# Patient Record
Sex: Male | Born: 1940 | Race: White | Hispanic: No | Marital: Married | Smoking: Former smoker
Health system: Southern US, Community
[De-identification: ages and names within clinical notes are randomized; demographics above are authoritative.]

## PROBLEM LIST (undated history)

## (undated) DIAGNOSIS — J189 Pneumonia, unspecified organism: Secondary | ICD-10-CM

## (undated) DIAGNOSIS — Z9989 Dependence on other enabling machines and devices: Secondary | ICD-10-CM

## (undated) DIAGNOSIS — Z85038 Personal history of other malignant neoplasm of large intestine: Secondary | ICD-10-CM

## (undated) DIAGNOSIS — G4730 Sleep apnea, unspecified: Secondary | ICD-10-CM

## (undated) DIAGNOSIS — E785 Hyperlipidemia, unspecified: Secondary | ICD-10-CM

## (undated) DIAGNOSIS — E7800 Pure hypercholesterolemia, unspecified: Secondary | ICD-10-CM

## (undated) DIAGNOSIS — K222 Esophageal obstruction: Secondary | ICD-10-CM

## (undated) DIAGNOSIS — I359 Nonrheumatic aortic valve disorder, unspecified: Secondary | ICD-10-CM

## (undated) DIAGNOSIS — G609 Hereditary and idiopathic neuropathy, unspecified: Secondary | ICD-10-CM

## (undated) DIAGNOSIS — K922 Gastrointestinal hemorrhage, unspecified: Secondary | ICD-10-CM

## (undated) DIAGNOSIS — K766 Portal hypertension: Secondary | ICD-10-CM

## (undated) DIAGNOSIS — S069X9A Unspecified intracranial injury with loss of consciousness of unspecified duration, initial encounter: Secondary | ICD-10-CM

## (undated) DIAGNOSIS — Z8679 Personal history of other diseases of the circulatory system: Secondary | ICD-10-CM

## (undated) DIAGNOSIS — G3281 Cerebellar ataxia in diseases classified elsewhere: Secondary | ICD-10-CM

## (undated) DIAGNOSIS — H9190 Unspecified hearing loss, unspecified ear: Secondary | ICD-10-CM

## (undated) DIAGNOSIS — J0100 Acute maxillary sinusitis, unspecified: Secondary | ICD-10-CM

## (undated) DIAGNOSIS — K219 Gastro-esophageal reflux disease without esophagitis: Secondary | ICD-10-CM

## (undated) DIAGNOSIS — I8500 Esophageal varices without bleeding: Secondary | ICD-10-CM

## (undated) DIAGNOSIS — G20 Parkinson's disease: Secondary | ICD-10-CM

## (undated) DIAGNOSIS — J069 Acute upper respiratory infection, unspecified: Secondary | ICD-10-CM

## (undated) DIAGNOSIS — K5790 Diverticulosis of intestine, part unspecified, without perforation or abscess without bleeding: Secondary | ICD-10-CM

## (undated) DIAGNOSIS — H908 Mixed conductive and sensorineural hearing loss, unspecified: Secondary | ICD-10-CM

## (undated) DIAGNOSIS — G4733 Obstructive sleep apnea (adult) (pediatric): Secondary | ICD-10-CM

## (undated) DIAGNOSIS — Z8719 Personal history of other diseases of the digestive system: Secondary | ICD-10-CM

## (undated) DIAGNOSIS — I4891 Unspecified atrial fibrillation: Secondary | ICD-10-CM

## (undated) DIAGNOSIS — R7309 Other abnormal glucose: Secondary | ICD-10-CM

## (undated) DIAGNOSIS — I2510 Atherosclerotic heart disease of native coronary artery without angina pectoris: Secondary | ICD-10-CM

## (undated) DIAGNOSIS — K469 Unspecified abdominal hernia without obstruction or gangrene: Secondary | ICD-10-CM

## (undated) DIAGNOSIS — G629 Polyneuropathy, unspecified: Secondary | ICD-10-CM

## (undated) DIAGNOSIS — K7400 Hepatic fibrosis, unspecified: Secondary | ICD-10-CM

## (undated) DIAGNOSIS — C801 Malignant (primary) neoplasm, unspecified: Secondary | ICD-10-CM

## (undated) DIAGNOSIS — C182 Malignant neoplasm of ascending colon: Secondary | ICD-10-CM

## (undated) DIAGNOSIS — M1990 Unspecified osteoarthritis, unspecified site: Secondary | ICD-10-CM

## (undated) DIAGNOSIS — N200 Calculus of kidney: Secondary | ICD-10-CM

## (undated) DIAGNOSIS — I8501 Esophageal varices with bleeding: Secondary | ICD-10-CM

## (undated) DIAGNOSIS — R0602 Shortness of breath: Secondary | ICD-10-CM

## (undated) DIAGNOSIS — D696 Thrombocytopenia, unspecified: Secondary | ICD-10-CM

## (undated) DIAGNOSIS — N400 Enlarged prostate without lower urinary tract symptoms: Secondary | ICD-10-CM

## (undated) DIAGNOSIS — R260 Ataxic gait: Secondary | ICD-10-CM

## (undated) DIAGNOSIS — N529 Male erectile dysfunction, unspecified: Secondary | ICD-10-CM

## (undated) DIAGNOSIS — Z86010 Personal history of colonic polyps: Secondary | ICD-10-CM

## (undated) DIAGNOSIS — S72409A Unspecified fracture of lower end of unspecified femur, initial encounter for closed fracture: Secondary | ICD-10-CM

## (undated) DIAGNOSIS — R0902 Hypoxemia: Secondary | ICD-10-CM

## (undated) DIAGNOSIS — D649 Anemia, unspecified: Secondary | ICD-10-CM

## (undated) HISTORY — DX: Diverticulosis of intestine, part unspecified, without perforation or abscess without bleeding: K57.90

## (undated) HISTORY — DX: Hyperlipidemia, unspecified: E78.5

## (undated) HISTORY — DX: Esophageal varices with bleeding: I85.01

## (undated) HISTORY — DX: Unspecified atrial fibrillation: I48.91

## (undated) HISTORY — DX: Personal history of other diseases of the digestive system: Z87.19

## (undated) HISTORY — DX: Other disorders of bilirubin metabolism: E80.6

## (undated) HISTORY — DX: Hypoxemia: R09.02

## (undated) HISTORY — DX: Shortness of breath: R06.02

## (undated) HISTORY — DX: Anemia, unspecified: D64.9

## (undated) HISTORY — DX: Unspecified hearing loss, unspecified ear: H91.90

## (undated) HISTORY — DX: Benign prostatic hyperplasia without lower urinary tract symptoms: N40.0

## (undated) HISTORY — DX: Portal hypertension: K76.6

## (undated) HISTORY — DX: Hepatic fibrosis, unspecified: K74.00

## (undated) HISTORY — DX: Calculus of kidney: N20.0

## (undated) HISTORY — DX: Unspecified osteoarthritis, unspecified site: M19.90

## (undated) HISTORY — DX: Pneumonia, unspecified organism: J18.9

## (undated) HISTORY — DX: Personal history of colonic polyps: Z86.010

## (undated) HISTORY — PX: INGUINAL HERNIA REPAIR: SUR1180

## (undated) HISTORY — DX: Esophageal obstruction: K22.2

## (undated) HISTORY — DX: Personal history of other diseases of the circulatory system: Z86.79

## (undated) HISTORY — DX: Malignant (primary) neoplasm, unspecified: C80.1

## (undated) HISTORY — DX: Atherosclerotic heart disease of native coronary artery without angina pectoris: I25.10

---
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
## 1984-02-09 DIAGNOSIS — S069X9A Unspecified intracranial injury with loss of consciousness of unspecified duration, initial encounter: Secondary | ICD-10-CM

## 1984-02-09 DIAGNOSIS — S069XAA Unspecified intracranial injury with loss of consciousness status unknown, initial encounter: Secondary | ICD-10-CM

## 1984-02-09 HISTORY — DX: Unspecified intracranial injury with loss of consciousness status unknown, initial encounter: S06.9XAA

## 1984-02-09 HISTORY — DX: Unspecified intracranial injury with loss of consciousness of unspecified duration, initial encounter: S06.9X9A

## 1997-11-12 ENCOUNTER — Encounter: Payer: Self-pay | Admitting: Gastroenterology

## 2001-04-24 ENCOUNTER — Encounter: Payer: Self-pay | Admitting: Pulmonary Disease

## 2001-04-24 ENCOUNTER — Ambulatory Visit (HOSPITAL_BASED_OUTPATIENT_CLINIC_OR_DEPARTMENT_OTHER): Admission: RE | Admit: 2001-04-24 | Discharge: 2001-04-24 | Payer: Self-pay | Admitting: Internal Medicine

## 2001-05-18 ENCOUNTER — Encounter: Payer: Self-pay | Admitting: Pulmonary Disease

## 2002-07-10 HISTORY — PX: CARDIAC CATHETERIZATION: SHX172

## 2002-07-30 ENCOUNTER — Encounter: Payer: Self-pay | Admitting: Cardiology

## 2002-07-30 ENCOUNTER — Ambulatory Visit (HOSPITAL_COMMUNITY): Admission: RE | Admit: 2002-07-30 | Discharge: 2002-07-30 | Payer: Self-pay | Admitting: Cardiology

## 2003-01-25 ENCOUNTER — Emergency Department (HOSPITAL_COMMUNITY): Admission: EM | Admit: 2003-01-25 | Discharge: 2003-01-25 | Payer: Self-pay | Admitting: Emergency Medicine

## 2004-02-09 DIAGNOSIS — I4891 Unspecified atrial fibrillation: Secondary | ICD-10-CM

## 2004-02-09 HISTORY — DX: Unspecified atrial fibrillation: I48.91

## 2004-02-09 HISTORY — PX: ESOPHAGEAL DILATION: SHX303

## 2004-02-27 ENCOUNTER — Ambulatory Visit: Payer: Self-pay | Admitting: Pulmonary Disease

## 2004-05-08 ENCOUNTER — Ambulatory Visit: Payer: Self-pay | Admitting: Gastroenterology

## 2004-07-21 ENCOUNTER — Ambulatory Visit: Payer: Self-pay | Admitting: Internal Medicine

## 2004-07-27 ENCOUNTER — Encounter: Admission: RE | Admit: 2004-07-27 | Discharge: 2004-07-27 | Payer: Self-pay | Admitting: Internal Medicine

## 2004-08-07 ENCOUNTER — Ambulatory Visit: Payer: Self-pay | Admitting: Gastroenterology

## 2005-01-21 ENCOUNTER — Emergency Department (HOSPITAL_COMMUNITY): Admission: EM | Admit: 2005-01-21 | Discharge: 2005-01-22 | Payer: Self-pay | Admitting: Emergency Medicine

## 2005-06-24 ENCOUNTER — Ambulatory Visit: Payer: Self-pay | Admitting: Internal Medicine

## 2005-11-10 ENCOUNTER — Ambulatory Visit: Payer: Self-pay | Admitting: Internal Medicine

## 2005-12-23 ENCOUNTER — Ambulatory Visit: Payer: Self-pay | Admitting: Internal Medicine

## 2006-09-06 ENCOUNTER — Encounter: Payer: Self-pay | Admitting: Internal Medicine

## 2006-09-22 ENCOUNTER — Encounter (INDEPENDENT_AMBULATORY_CARE_PROVIDER_SITE_OTHER): Payer: Self-pay | Admitting: *Deleted

## 2006-09-22 ENCOUNTER — Ambulatory Visit: Payer: Self-pay | Admitting: Internal Medicine

## 2006-09-22 DIAGNOSIS — E785 Hyperlipidemia, unspecified: Secondary | ICD-10-CM | POA: Insufficient documentation

## 2006-09-22 DIAGNOSIS — N400 Enlarged prostate without lower urinary tract symptoms: Secondary | ICD-10-CM | POA: Insufficient documentation

## 2006-09-22 DIAGNOSIS — R7989 Other specified abnormal findings of blood chemistry: Secondary | ICD-10-CM | POA: Insufficient documentation

## 2006-09-22 DIAGNOSIS — N529 Male erectile dysfunction, unspecified: Secondary | ICD-10-CM

## 2006-09-22 HISTORY — DX: Male erectile dysfunction, unspecified: N52.9

## 2006-09-23 ENCOUNTER — Encounter: Payer: Self-pay | Admitting: Internal Medicine

## 2006-09-24 LAB — CONVERTED CEMR LAB
ALT: 26 units/L (ref 0–53)
AST: 22 units/L (ref 0–37)
Albumin: 3.9 g/dL (ref 3.5–5.2)
Alkaline Phosphatase: 86 units/L (ref 39–117)
BUN: 10 mg/dL (ref 6–23)
Basophils Absolute: 0 10*3/uL (ref 0.0–0.1)
Basophils Relative: 0.5 % (ref 0.0–1.0)
Bilirubin, Direct: 0.1 mg/dL (ref 0.0–0.3)
CO2: 31 meq/L (ref 19–32)
Calcium: 9.4 mg/dL (ref 8.4–10.5)
Chloride: 109 meq/L (ref 96–112)
Creatinine, Ser: 1.1 mg/dL (ref 0.4–1.5)
Eosinophils Absolute: 0.2 10*3/uL (ref 0.0–0.6)
Eosinophils Relative: 3.9 % (ref 0.0–5.0)
GFR calc Af Amer: 87 mL/min
GFR calc non Af Amer: 72 mL/min
Glucose, Bld: 113 mg/dL — ABNORMAL HIGH (ref 70–99)
HCT: 44.5 % (ref 39.0–52.0)
Hemoglobin: 15.4 g/dL (ref 13.0–17.0)
Hgb A1c MFr Bld: 5 % (ref 4.6–6.0)
Lymphocytes Relative: 38.6 % (ref 12.0–46.0)
MCHC: 34.5 g/dL (ref 30.0–36.0)
MCV: 92.5 fL (ref 78.0–100.0)
Monocytes Absolute: 0.4 10*3/uL (ref 0.2–0.7)
Monocytes Relative: 8.5 % (ref 3.0–11.0)
Neutro Abs: 2.2 10*3/uL (ref 1.4–7.7)
Neutrophils Relative %: 48.5 % (ref 43.0–77.0)
PSA: 2.34 ng/mL (ref 0.10–4.00)
Platelets: 176 10*3/uL (ref 150–400)
Potassium: 3.7 meq/L (ref 3.5–5.1)
RBC: 4.81 M/uL (ref 4.22–5.81)
RDW: 12.4 % (ref 11.5–14.6)
Sodium: 144 meq/L (ref 135–145)
TSH: 2.01 microintl units/mL (ref 0.35–5.50)
Testosterone: 355.6 ng/dL (ref 350.00–890)
Total Bilirubin: 1.2 mg/dL (ref 0.3–1.2)
Total Protein: 6.8 g/dL (ref 6.0–8.3)
WBC: 4.5 10*3/uL (ref 4.5–10.5)

## 2006-09-26 ENCOUNTER — Encounter (INDEPENDENT_AMBULATORY_CARE_PROVIDER_SITE_OTHER): Payer: Self-pay | Admitting: *Deleted

## 2007-02-06 ENCOUNTER — Emergency Department (HOSPITAL_COMMUNITY): Admission: EM | Admit: 2007-02-06 | Discharge: 2007-02-06 | Payer: Self-pay | Admitting: Emergency Medicine

## 2007-09-05 ENCOUNTER — Encounter: Payer: Self-pay | Admitting: Internal Medicine

## 2007-10-03 ENCOUNTER — Ambulatory Visit: Payer: Self-pay | Admitting: Internal Medicine

## 2007-10-03 DIAGNOSIS — I4891 Unspecified atrial fibrillation: Secondary | ICD-10-CM | POA: Insufficient documentation

## 2007-10-03 DIAGNOSIS — R5383 Other fatigue: Secondary | ICD-10-CM | POA: Insufficient documentation

## 2007-10-03 DIAGNOSIS — R5381 Other malaise: Secondary | ICD-10-CM | POA: Insufficient documentation

## 2007-10-09 LAB — CONVERTED CEMR LAB
Basophils Absolute: 0.1 10*3/uL (ref 0.0–0.1)
Basophils Relative: 1.9 % (ref 0.0–3.0)
Eosinophils Absolute: 0.2 10*3/uL (ref 0.0–0.7)
Eosinophils Relative: 5.4 % — ABNORMAL HIGH (ref 0.0–5.0)
Free T4: 0.9 ng/dL (ref 0.6–1.6)
HCT: 40 % (ref 39.0–52.0)
Hemoglobin: 14.4 g/dL (ref 13.0–17.0)
Lymphocytes Relative: 36.6 % (ref 12.0–46.0)
MCHC: 36 g/dL (ref 30.0–36.0)
MCV: 91.2 fL (ref 78.0–100.0)
Monocytes Absolute: 0.3 10*3/uL (ref 0.1–1.0)
Monocytes Relative: 6.6 % (ref 3.0–12.0)
Neutro Abs: 2.3 10*3/uL (ref 1.4–7.7)
Neutrophils Relative %: 49.5 % (ref 43.0–77.0)
Platelets: 170 10*3/uL (ref 150–400)
Pro B Natriuretic peptide (BNP): 41 pg/mL (ref 0.0–100.0)
RBC: 4.39 M/uL (ref 4.22–5.81)
RDW: 12.4 % (ref 11.5–14.6)
TSH: 0.84 microintl units/mL (ref 0.35–5.50)
WBC: 4.5 10*3/uL (ref 4.5–10.5)

## 2007-10-10 ENCOUNTER — Ambulatory Visit: Payer: Self-pay | Admitting: Internal Medicine

## 2007-10-10 ENCOUNTER — Encounter (INDEPENDENT_AMBULATORY_CARE_PROVIDER_SITE_OTHER): Payer: Self-pay | Admitting: *Deleted

## 2007-10-10 LAB — CONVERTED CEMR LAB
OCCULT 1: NEGATIVE
OCCULT 2: NEGATIVE
OCCULT 3: NEGATIVE

## 2007-10-19 ENCOUNTER — Ambulatory Visit: Payer: Self-pay | Admitting: Pulmonary Disease

## 2007-11-15 ENCOUNTER — Ambulatory Visit: Payer: Self-pay | Admitting: Internal Medicine

## 2007-12-13 ENCOUNTER — Ambulatory Visit: Payer: Self-pay | Admitting: Internal Medicine

## 2007-12-13 DIAGNOSIS — R7309 Other abnormal glucose: Secondary | ICD-10-CM | POA: Insufficient documentation

## 2007-12-14 ENCOUNTER — Ambulatory Visit: Payer: Self-pay | Admitting: Pulmonary Disease

## 2007-12-14 DIAGNOSIS — G4733 Obstructive sleep apnea (adult) (pediatric): Secondary | ICD-10-CM | POA: Insufficient documentation

## 2007-12-15 ENCOUNTER — Encounter: Payer: Self-pay | Admitting: Pulmonary Disease

## 2007-12-16 LAB — CONVERTED CEMR LAB
ALT: 45 units/L (ref 0–53)
AST: 26 units/L (ref 0–37)
Albumin: 3.9 g/dL (ref 3.5–5.2)
Alkaline Phosphatase: 84 units/L (ref 39–117)
BUN: 10 mg/dL (ref 6–23)
Bilirubin, Direct: 0.2 mg/dL (ref 0.0–0.3)
Creatinine, Ser: 1.1 mg/dL (ref 0.4–1.5)
Hgb A1c MFr Bld: 5.1 % (ref 4.6–6.0)
PSA: 2.32 ng/mL (ref 0.10–4.00)
Potassium: 4.5 meq/L (ref 3.5–5.1)
TSH: 1 microintl units/mL (ref 0.35–5.50)
Total Bilirubin: 1.7 mg/dL — ABNORMAL HIGH (ref 0.3–1.2)
Total Protein: 7.3 g/dL (ref 6.0–8.3)

## 2007-12-18 ENCOUNTER — Encounter (INDEPENDENT_AMBULATORY_CARE_PROVIDER_SITE_OTHER): Payer: Self-pay | Admitting: *Deleted

## 2008-01-13 ENCOUNTER — Encounter: Payer: Self-pay | Admitting: Pulmonary Disease

## 2008-01-16 ENCOUNTER — Telehealth: Payer: Self-pay | Admitting: Pulmonary Disease

## 2008-05-08 ENCOUNTER — Ambulatory Visit: Payer: Self-pay | Admitting: Pulmonary Disease

## 2008-05-24 ENCOUNTER — Ambulatory Visit: Payer: Self-pay | Admitting: Family Medicine

## 2008-07-09 ENCOUNTER — Ambulatory Visit: Payer: Self-pay | Admitting: Family Medicine

## 2008-07-09 DIAGNOSIS — K222 Esophageal obstruction: Secondary | ICD-10-CM | POA: Insufficient documentation

## 2008-07-11 ENCOUNTER — Ambulatory Visit: Payer: Self-pay | Admitting: Gastroenterology

## 2008-08-20 ENCOUNTER — Encounter: Payer: Self-pay | Admitting: Family Medicine

## 2008-10-07 ENCOUNTER — Ambulatory Visit: Payer: Self-pay | Admitting: Family Medicine

## 2008-10-07 DIAGNOSIS — H6980 Other specified disorders of Eustachian tube, unspecified ear: Secondary | ICD-10-CM | POA: Insufficient documentation

## 2008-12-16 ENCOUNTER — Ambulatory Visit: Payer: Self-pay | Admitting: Family Medicine

## 2008-12-16 LAB — CONVERTED CEMR LAB
ALT: 23 units/L (ref 0–53)
AST: 22 units/L (ref 0–37)
Albumin: 3.6 g/dL (ref 3.5–5.2)
Alkaline Phosphatase: 77 units/L (ref 39–117)
BUN: 7 mg/dL (ref 6–23)
Bilirubin Urine: NEGATIVE
Bilirubin, Direct: 0.1 mg/dL (ref 0.0–0.3)
CO2: 29 meq/L (ref 19–32)
Calcium: 9.2 mg/dL (ref 8.4–10.5)
Chloride: 108 meq/L (ref 96–112)
Cholesterol: 122 mg/dL (ref 0–200)
Creatinine, Ser: 1.1 mg/dL (ref 0.4–1.5)
Creatinine,U: 106.9 mg/dL
GFR calc non Af Amer: 70.94 mL/min (ref >60)
Glucose, Bld: 126 mg/dL — ABNORMAL HIGH (ref 70–99)
Glucose, Urine, Semiquant: NEGATIVE
HDL: 39.4 mg/dL (ref >39.00)
Hgb A1c MFr Bld: 5 % (ref 4.6–6.5)
Ketones, urine, test strip: NEGATIVE
LDL Cholesterol: 67 mg/dL (ref 0–99)
Microalb Creat Ratio: 1.9 mg/g (ref 0.0–30.0)
Microalb, Ur: 0.2 mg/dL (ref 0.0–1.9)
Nitrite: NEGATIVE
PSA: 2.67 ng/mL (ref 0.10–4.00)
Potassium: 4.4 meq/L (ref 3.5–5.1)
Protein, U semiquant: NEGATIVE
Sodium: 143 meq/L (ref 135–145)
Specific Gravity, Urine: 1.015
TSH: 1.26 microintl units/mL (ref 0.35–5.50)
Total Bilirubin: 1.3 mg/dL — ABNORMAL HIGH (ref 0.3–1.2)
Total CHOL/HDL Ratio: 3
Total Protein: 6.8 g/dL (ref 6.0–8.3)
Triglycerides: 76 mg/dL (ref 0.0–149.0)
Urobilinogen, UA: 0.2
VLDL: 15.2 mg/dL (ref 0.0–40.0)
WBC Urine, dipstick: NEGATIVE
pH: 6

## 2008-12-24 ENCOUNTER — Ambulatory Visit: Payer: Self-pay | Admitting: Family Medicine

## 2008-12-24 DIAGNOSIS — H908 Mixed conductive and sensorineural hearing loss, unspecified: Secondary | ICD-10-CM

## 2008-12-24 HISTORY — DX: Mixed conductive and sensorineural hearing loss, unspecified: H90.8

## 2009-01-01 ENCOUNTER — Encounter: Payer: Self-pay | Admitting: Family Medicine

## 2009-07-22 ENCOUNTER — Encounter (INDEPENDENT_AMBULATORY_CARE_PROVIDER_SITE_OTHER): Payer: Self-pay | Admitting: *Deleted

## 2009-09-01 ENCOUNTER — Encounter (INDEPENDENT_AMBULATORY_CARE_PROVIDER_SITE_OTHER): Payer: Self-pay | Admitting: *Deleted

## 2009-09-03 ENCOUNTER — Ambulatory Visit: Payer: Self-pay | Admitting: Gastroenterology

## 2009-09-17 ENCOUNTER — Ambulatory Visit: Payer: Self-pay | Admitting: Gastroenterology

## 2009-09-17 DIAGNOSIS — Z860100 Personal history of colon polyps, unspecified: Secondary | ICD-10-CM

## 2009-09-17 HISTORY — DX: Personal history of colon polyps, unspecified: Z86.0100

## 2009-09-17 HISTORY — DX: Personal history of colonic polyps: Z86.010

## 2009-09-23 ENCOUNTER — Encounter: Payer: Self-pay | Admitting: Gastroenterology

## 2009-12-24 ENCOUNTER — Ambulatory Visit: Payer: Self-pay | Admitting: Family Medicine

## 2009-12-24 LAB — CONVERTED CEMR LAB
ALT: 22 units/L (ref 0–53)
AST: 23 units/L (ref 0–37)
Albumin: 3.9 g/dL (ref 3.5–5.2)
Alkaline Phosphatase: 70 units/L (ref 39–117)
BUN: 14 mg/dL (ref 6–23)
Basophils Absolute: 0 10*3/uL (ref 0.0–0.1)
Basophils Relative: 0.5 % (ref 0.0–3.0)
Bilirubin Urine: NEGATIVE
Bilirubin, Direct: 0.2 mg/dL (ref 0.0–0.3)
Blood in Urine, dipstick: NEGATIVE
CO2: 28 meq/L (ref 19–32)
Calcium: 9.1 mg/dL (ref 8.4–10.5)
Chloride: 106 meq/L (ref 96–112)
Cholesterol: 156 mg/dL (ref 0–200)
Creatinine, Ser: 1.1 mg/dL (ref 0.4–1.5)
Eosinophils Absolute: 0.2 10*3/uL (ref 0.0–0.7)
Eosinophils Relative: 4.4 % (ref 0.0–5.0)
GFR calc non Af Amer: 72.23 mL/min (ref >60)
Glucose, Bld: 100 mg/dL — ABNORMAL HIGH (ref 70–99)
Glucose, Urine, Semiquant: NEGATIVE
HCT: 38.7 % — ABNORMAL LOW (ref 39.0–52.0)
HDL: 51.7 mg/dL (ref >39.00)
Hemoglobin: 13.4 g/dL (ref 13.0–17.0)
LDL Cholesterol: 92 mg/dL (ref 0–99)
Lymphocytes Relative: 31.7 % (ref 12.0–46.0)
Lymphs Abs: 1.3 10*3/uL (ref 0.7–4.0)
MCHC: 34.6 g/dL (ref 30.0–36.0)
MCV: 91.4 fL (ref 78.0–100.0)
Monocytes Absolute: 0.3 10*3/uL (ref 0.1–1.0)
Monocytes Relative: 8 % (ref 3.0–12.0)
Neutro Abs: 2.2 10*3/uL (ref 1.4–7.7)
Neutrophils Relative %: 55.4 % (ref 43.0–77.0)
Nitrite: NEGATIVE
PSA: 2.25 ng/mL (ref 0.10–4.00)
Platelets: 142 10*3/uL — ABNORMAL LOW (ref 150.0–400.0)
Potassium: 4.3 meq/L (ref 3.5–5.1)
Protein, U semiquant: NEGATIVE
RBC: 4.23 M/uL (ref 4.22–5.81)
RDW: 13.1 % (ref 11.5–14.6)
Sodium: 141 meq/L (ref 135–145)
Specific Gravity, Urine: 1.015
TSH: 0.76 microintl units/mL (ref 0.35–5.50)
Total Bilirubin: 1.3 mg/dL — ABNORMAL HIGH (ref 0.3–1.2)
Total CHOL/HDL Ratio: 3
Total Protein: 6.6 g/dL (ref 6.0–8.3)
Triglycerides: 63 mg/dL (ref 0.0–149.0)
Urobilinogen, UA: 0.2
VLDL: 12.6 mg/dL (ref 0.0–40.0)
WBC Urine, dipstick: NEGATIVE
WBC: 4 10*3/uL — ABNORMAL LOW (ref 4.5–10.5)
pH: 5.5

## 2009-12-30 ENCOUNTER — Encounter: Payer: Self-pay | Admitting: Family Medicine

## 2009-12-30 ENCOUNTER — Ambulatory Visit: Payer: Self-pay | Admitting: Family Medicine

## 2010-02-12 ENCOUNTER — Telehealth: Payer: Self-pay | Admitting: Family Medicine

## 2010-02-28 ENCOUNTER — Encounter: Payer: Self-pay | Admitting: Internal Medicine

## 2010-03-10 NOTE — Miscellaneous (Signed)
Summary: [facility] Previsit/prep  Clinical Lists Changes  Medications: Added new medication of MOVIPREP 100 GM  SOLR (PEG-KCL-NACL-NASULF-NA ASC-C) As per prep instructions. - Signed Rx of MOVIPREP 100 GM  SOLR (PEG-KCL-NACL-NASULF-NA ASC-C) As per prep instructions.;  #1 x 0;  Signed;  Entered by: Wyona Almas RN;  Authorized by: Louis Meckel MD;  Method used: [facility] [phone]*, [address], [place], [place]  [address], Ph: [phone] or [phone], Fax: [phone] Observations: Added new observation of NKA: T (09/03/2009 8:34)    Prescriptions: MOVIPREP 100 GM  SOLR (PEG-KCL-NACL-NASULF-NA ASC-C) As per prep instructions.  #1 x 0   Entered by:   Wyona Almas RN   Authorized by:   Louis Meckel MD   Signed by:   Wyona Almas RN on 09/03/2009   Method used:   [facility]ronically to        [facility] [phone]* (retail)       [address]       [place], [place]  [address]       Ph: [phone] or [phone]       Fax: [phone]   RxID:   [id number]

## 2010-03-10 NOTE — Letter (Signed)
Summary: Colonoscopy Letter  [facility]  [address] [place], [place] [address]   Phone: [phone]  Fax: [phone]      July 22, 2009 MRN: [id number]   [name] [address] [place], [place]  [address]   Dear [name],   According to your medical record, it is time for you to schedule a Colonoscopy. The American Cancer Society recommends this procedure as a method to detect early colon cancer. Patients with a family history of colon cancer, or a personal history of colon polyps or inflammatory bowel disease are at increased risk.  This letter has been generated based on the recommendations made at the time of your procedure. If you feel that in your particular situation this may no longer apply, please contact our office.  Please call our office at [phone] to schedule this appointment or to update your records at your earliest convenience.  Thank you for cooperating with [place] to provide you with the very best care possible.   Sincerely,  Barbette Hair. Arlyce Dice, M.D.  [facility] [phone]

## 2010-03-10 NOTE — Procedures (Signed)
Summary: Colonoscopy  Patient: [name] Note: All result statuses are Final unless otherwise noted.  Tests: (1) Colonoscopy (COL)   COL Colonoscopy           DONE     [facility]     [address]     [place], [place]  [address]           COLONOSCOPY PROCEDURE REPORT           PATIENT:  [name], [name]  MR#:  [id number]     BIRTHDATE:  Feb 04, 1941, 68 yrs. old  GENDER:  male           ENDOSCOPIST:  Barbette Hair. Arlyce Dice, MD     Referred by:           PROCEDURE DATE:  09/17/2009     PROCEDURE:  Colonoscopy with snare polypectomy     ASA CLASS:  Class II     INDICATIONS:  1) rectal bleeding  2) family history of colon     cancer Brother           MEDICATIONS:   Fentanyl 50 mcg IV, Versed 8 mg IV           DESCRIPTION OF PROCEDURE:   After the risks benefits and     alternatives of the procedure were thoroughly explained, informed     consent was obtained.  Digital rectal exam was performed and     revealed no abnormalities.   The LB [id number] endoscope     was introduced through the anus and advanced to the cecum, which     was identified by both the appendix and ileocecal valve, without     limitations.  The quality of the prep was good, using MoviPrep.     The instrument was then slowly withdrawn as the colon was fully     examined.     <<PROCEDUREIMAGES>>           FINDINGS:  A sessile polyp was found in the proximal transverse     colon. It was 3-4 mm in size. Polyp was snared without cautery.     Retrieval was successful (see image7). snare polyp  Moderate     diverticulosis was found in the sigmoid colon (see image14).  This     was otherwise a normal examination of the colon (see image2,     image3, image6, image10, image11, image15, image16, and image17).     Retroflexed views in the rectum revealed no abnormalities.    The     time to cecum =  5.50  minutes. The scope was then withdrawn (time     =  7.0  min) from the patient and the procedure  completed.           COMPLICATIONS:  None           ENDOSCOPIC IMPRESSION:     1) 3 mm sessile polyp in the proximal transverse colon     2) Moderate diverticulosis in the sigmoid colon     3) Otherwise normal examination           Limited rectal bleeding secondary to hemorrhoids           RECOMMENDATIONS:     1) Given your significant family history of colon cancer, you     should have a repeat colonoscopy in 5 years           REPEAT EXAM:  In 5 year(s) for Colonoscopy.  ______________________________     Barbette Hair Arlyce Dice, MD           CC: Roderick Pee, MD           n.     Rosalie Doctor:   Barbette Hair. Kaplan at 09/17/2009 11:26 AM           Page 2 of 3   Javaun, Dimperio [id number]  Note: An exclamation mark (!) indicates a result that was not dispersed into the flowsheet. Document Creation Date: 09/17/2009 11:26 AM _______________________________________________________________________  (1) Order result status: Final Collection or observation date-time: 09/17/2009 11:19 Requested date-time:  Receipt date-time:  Reported date-time:  Referring Physician:   Ordering Physician: Melvia Heaps [phone]) Specimen Source:  Source: Launa Grill Order Number: [id number] Lab site:   Appended Document: Colonoscopy     Procedures Next Due Date:    Colonoscopy: 09/2014

## 2010-03-10 NOTE — Assessment & Plan Note (Signed)
Summary: CPX // RS   Vital Signs:  Patient profile:   70 year old male Height:      71.75 inches Weight:      176 pounds BMI:     24.12 Temp:     98.0 degrees F oral BP sitting:   120 / 68  (left arm) Cuff size:   regular  Vitals Entered By: Kern Reap CMA Duncan Dull) (December 30, 2009 10:44 AM) CC: wellness exam Is Patient Diabetic? No   Primary Care Provider:  Kelle Darting, MD  CC:  wellness exam.  History of Present Illness: [name] is a 70 year old male, who comes in today for Medicare wellness exam.  Because of underlying hyperlipidemia erectile dysfunction.  Reflux esophagitis.  For hyperlipidemia.  He takes simvastatin 20 mg nightly and baby aspirin.  He also uses Viagra 100 mg p.r.n.  he also uses omeprazole 20 mg b.i.d. for reflux esophagitis.  He also takes Provigil 100 mg daily via neurology for sleep apnea. he gets routine eye care , dental care, last colonoscopy June 2011 one small polyp, removed.  Tetanus 2004, Pneumovax 2010, seasonal flu shot today, information given on shingles.  He also has some moles he would like checked and  soreness in his right and left thumb.  This getting worse and is unresponsive to OTC anti-inflammatories.Here for Medicare AWV:  1.   Risk factors based on Past M, S, F history:reviewed no changes 2.   Physical Activities: walks daily 3.   Depression/mood: good mood.  No depression 4.   Hearing: improved because of the new hearing aids 5.   ADL's: functions independently 6.   Fall Risk: reviewed and identified 7.   Home Safety: no guns in the house 8.   Height, weight, &visual acuity:height weight, normal.  Vision normal 9.   Counseling: continue good health habits 10.   Labs ordered based on risk factors: labs all normal 11.           Referral Coordination...Marland KitchenMarland KitchenMarland Kitchennone indicated 12.           Care Plan........Marland Kitchenreviewed medications and exercise program 13.            Cognitive Assessment .Marland Kitchenoriented x 3 does all his own financial work  independently  Allergies (verified): No Known Drug Allergies  Past History:  Past medical, surgical, family and social histories (including risk factors) reviewed, and no changes noted (except as noted below). Past medical, surgical, family and social histories (including risk factors) reviewed for relevance to current acute and chronic problems.  Past Medical History: Reviewed history from 07/10/2008 and no changes required. 1987 head & back injury from fall ;  hyperlipidemia; Atrial fibrillation X 1 in 2006 , Dr Donnie Aho Diverticulosis  Past Surgical History: Reviewed history from 10/03/2007 and no changes required. 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 Father: DM, prostate CA, MI @ 71 Mother:CVA, CHF  Siblings: bro colon CA;  bro  2 CABG initially in 62s; 1 bro stents @ 42; bro CBAG @ 36 Family History of Stomach Cancer:Sister  Social History: Reviewed history from 07/11/2008 and no changes required. Retired Married Alcohol use-no Regular exercise-no Patient is a former smoker.  Illicit Drug Use - no  Review of Systems      See HPI       Flu Vaccine Consent Questions     Do you have a history of severe allergic reactions to  this vaccine? no    Any prior history of allergic reactions to egg and/or gelatin? no    Do you have a sensitivity to the preservative Thimersol? no    Do you have a past history of Guillan-Barre Syndrome? no    Do you currently have an acute febrile illness? no    Have you ever had a severe reaction to latex? no    Vaccine information given and explained to patient? yes    Are you currently pregnant? no    Lot Number:[id number]   Exp Date:08/08/2010   Site Given  Left Deltoid IM     Physical Exam  General:  Well-developed,well-nourished,in no acute distress; alert,appropriate and cooperative throughout  examination Head:  Normocephalic and atraumatic without obvious abnormalities. No apparent alopecia or balding. Eyes:  No corneal or conjunctival inflammation noted. EOMI. Perrla. Funduscopic exam benign, without hemorrhages, exudates or papilledema. Vision grossly normal. Ears:  External ear exam shows no significant lesions or deformities.  Otoscopic examination reveals clear canals, tympanic membranes are intact bilaterally without bulging, retraction, inflammation or discharge. Hearing is grossly normal bilaterally. Nose:  External nasal examination shows no deformity or inflammation. Nasal mucosa are pink and moist without lesions or exudates. Mouth:  Oral mucosa and oropharynx without lesions or exudates.  Teeth in good repair. Neck:  No deformities, masses, or tenderness noted. Chest Wall:  barrel-shaped chest Breasts:  No masses or gynecomastia noted Lungs:  Normal respiratory effort, chest expands symmetrically. Lungs are clear to auscultation, no crackles or wheezes. Heart:  Normal rate and regular rhythm. S1 and S2 normal without gallop, murmur, click, rub or other extra sounds. Msk:  No deformity or scoliosis noted of thoracic or lumbar spine.   Pulses:  R and L carotid,radial,femoral,dorsalis pedis and posterior tibial pulses are full and equal bilaterally Extremities:  No clubbing, cyanosis, edema, or deformity noted with normal full range of motion of all joints.   Neurologic:  No cranial nerve deficits noted. Station and gait are normal. Plantar reflexes are down-going bilaterally. DTRs are symmetrical throughout. Sensory, motor and coordinative functions appear intact. Skin:  Intact without suspicious lesions or rashes Cervical Nodes:  No lymphadenopathy noted Axillary Nodes:  No palpable lymphadenopathy Inguinal Nodes:  No significant adenopathy Psych:  Cognition and judgment appear intact. Alert and cooperative with normal attention span and concentration. 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 Take one tab 1 hour prior to intercourse  Orders: Prescription Created Electronically [phone]) Medicare -1st Annual Wellness Visit [phone])  Problem # 3:  HYPERLIPIDEMIA NEC/NOS (ICD-272.4) Assessment: Improved  His updated medication list for this problem includes:    Niacin 500 Mg Tabs (Niacin) .Marland Kitchen... Take 2  tablets by mouth once a day    Simvastatin 20 Mg Tabs (Simvastatin) .Marland Kitchen... 1 qhs  Orders: Prescription Created Electronically [phone]) Medicare -1st Annual Wellness Visit [phone]) EKG w/ Interpretation ([address])  Problem # 4:  Preventive Health Care (ICD-V70.0) Assessment: Unchanged  Complete Medication List: 1)  Saw Palmetto 160 Mg Caps (Saw palmetto (serenoa repens)) .... Take 1 tablet by mouth two times a day 2)  Aspirin Low Dose 81 Mg Tabs (Aspirin) .... Take 1 tablet  by mouth once a day 3)  Fish Oil 1200 Mg Caps (Omega-3 fatty acids) .... Take 1 tablet by mouth two times a day 4)  Odorless Garlic 1250 Mg Tabs (Garlic) .... Take 1 tablet by mouth once a day 5)  Niacin 500 Mg Tabs (Niacin) .... Take 2  tablets by mouth once a day 6)  B Complete Tabs (B complex-biotin-fa) .... Take 1 tablet by mouth once a day 7)  Simvastatin 20 Mg Tabs (Simvastatin) .Marland Kitchen.. 1 qhs 8)  Viagra 100 Mg Tabs (Sildenafil citrate) .... Take one tab 1 hour prior to intercourse 9)  Omeprazole 20 Mg Tbec (Omeprazole) .... Take on tab two times a day 10)  Provigil 100 Mg Tabs (Modafinil) .... Take one tab by mouth once daily  Other Orders: Flu Vaccine 53yrs + MEDICARE PATIENTS  (E4540) Administration Flu vaccine - MCR (J8119)  Patient Instructions: 1)  Please schedule a follow-up appointment in 1 year. 2)  It is important that you exercise regularly at least 20 minutes 5 times a week. If you develop chest pain, have severe difficulty breathing, or feel very tired , stop exercising immediately and seek medical attention. 3)  Take an Aspirin every day. Prescriptions: OMEPRAZOLE 20 MG TBEC (OMEPRAZOLE) take on tab two times a day  #200 x 3   Entered and Authorized by:   Roderick Pee MD   Signed by:   Roderick Pee MD on 12/30/2009   Method used:   [facility] to        [facility] [phone][facility] (retail)       [address]       [place], [place]  [address]       Ph: [phone] or [phone]       Fax: [phone]   RxID:   [id number] VIAGRA 100 MG TABS (SILDENAFIL CITRATE) take one tab 1 hour prior to intercourse  #6 x 11   Entered and Authorized by:   Roderick Pee MD   Signed by:   Roderick Pee MD on 12/30/2009   Method used:   [facility]ronically to        [facility] [phone]* (retail)       [address]       [place], [place]  [address]       Ph: [phone] or [phone]       Fax: [phone]   RxID:   [id number] SIMVASTATIN 20 MG  TABS (SIMVASTATIN) 1 qhs  #100 x 3   Entered and Authorized by:   Roderick Pee MD   Signed by:   Roderick Pee MD on 12/30/2009   Method used:   [facility]ronically to        [facility] [phone]* (retail)       [address]       [place], [place]  [address]       Ph: [phone] or [phone]       Fax: [phone]   RxID:   [id number]    Orders Added: 1)  Prescription Created Electronically [phone] 2)  Medicare -1st Annual Wellness Visit [G0438] 3)  Flu Vaccine 22yrs + MEDICARE PATIENTS [Q2039] 4)  Administration Flu vaccine - MCR [G0008] 5)  EKG w/ Interpretation [93[address]]

## 2010-03-10 NOTE — Letter (Signed)
Summary: Patient Notice- Polyp Results  [facility]  [address] [place], [place] [address]   Phone: [phone]  Fax: [phone]        September 23, 2009 MRN: [id number]    [name] [address] [place], [place]  [address]    Dear [name],  I am pleased to inform you that the colon polyp(s) removed during your recent colonoscopy was (were) found to be benign (no cancer detected) upon pathologic examination.  I recommend you have a repeat colonoscopy examination in _5 years to look for recurrent polyps, as having colon polyps increases your risk for having recurrent polyps or even colon cancer in the future.  Should you develop new or worsening symptoms of abdominal pain, bowel habit changes or bleeding from the rectum or bowels, please schedule an evaluation with either your primary care physician or with me.  Additional information/recommendations:  __ No further action with gastroenterology is needed at this time. Please      follow-up with your primary care physician for your other healthcare      needs.  __ Please call [phone] to schedule a return visit to review your      situation.  __ Please keep your follow-up visit as already scheduled.  _x_ Continue treatment plan as outlined the day of your exam.  Please call us if you are having persistent problems or have questions about your condition that have not been fully answered at this time.  Sincerely,  Louis Meckel MD  This letter has been electronically signed by your physician.  Appended Document: Patient Notice- Polyp Results letter mailed

## 2010-03-10 NOTE — Letter (Signed)
Summary: [facility]  [facility]   Imported By: Maryln Gottron 03/05/2009 10:42:42  _____________________________________________________________________  External Attachment:    Type:   Image     Comment:   External Document

## 2010-03-10 NOTE — Letter (Signed)
Summary: [facility] Instructions  [facility]  [address] [place], [place] [address]   Phone: [phone]  Fax: [phone]       MONIQUE GIFT    [birth year]    MRN: [id number]        Procedure Day /Date:  09/17/09  Wednesday     Arrival Time:  9:30am     Procedure Time: 10:30am     Location of Procedure:                    _x _  [facility] (4th Floor)  PREPARATION FOR COLONOSCOPY WITH MOVIPREP   Starting 5 days prior to your procedure _8/5/11 _ do not eat nuts, seeds, popcorn, corn, beans, peas,  salads, or any raw vegetables.  Do not take any fiber supplements (e.g. Metamucil, Citrucel, and Benefiber).  THE DAY BEFORE YOUR PROCEDURE         DATE:  09/16/09   DAY:  Tuesday  1.  Drink clear liquids the entire day-NO SOLID FOOD  2.  Do not drink anything colored red or purple.  Avoid juices with pulp.  No orange juice.  3.  Drink at least 64 oz. (8 glasses) of fluid/clear liquids during the day to prevent dehydration and help the prep work efficiently.  CLEAR LIQUIDS INCLUDE: Water Jello Ice Popsicles Tea (sugar ok, no milk/cream) Powdered fruit flavored drinks Coffee (sugar ok, no milk/cream) Gatorade Juice: apple, white grape, white cranberry  Lemonade Clear bullion, consomm, broth Carbonated beverages (any kind) Strained chicken noodle soup Hard Candy                             4.  In the morning, mix first dose of MoviPrep solution:    Empty 1 Pouch A and 1 Pouch B into the disposable container    Add lukewarm drinking water to the top line of the container. Mix to dissolve    Refrigerate (mixed solution should be used within 24 hrs)  5.  Begin drinking the prep at 5:00 p.m. The MoviPrep container is divided by 4 marks.   Every 15 minutes drink the solution down to the next mark (approximately 8 oz) until the full liter is complete.   6.  Follow completed prep with 16 oz of clear liquid of your choice (Nothing red or purple).   Continue to drink clear liquids until bedtime.  7.  Before going to bed, mix second dose of MoviPrep solution:    Empty 1 Pouch A and 1 Pouch B into the disposable container    Add lukewarm drinking water to the top line of the container. Mix to dissolve    Refrigerate  THE DAY OF YOUR PROCEDURE      DATE:  09/17/09  DAY:  Wednesday  Beginning at  5:30 a.m. (5 hours before procedure):         1. Every 15 minutes, drink the solution down to the next mark (approx 8 oz) until the full liter is complete.  2. Follow completed prep with 16 oz. of clear liquid of your choice.    3. You may drink clear liquids until 8:30am  (2 HOURS BEFORE PROCEDURE).   MEDICATION INSTRUCTIONS  Unless otherwise instructed, you should take regular prescription medications with a small sip of water   as early as possible the morning of your procedure.        OTHER INSTRUCTIONS  You  will need a responsible adult at least 69 years of age to accompany you and drive you home.   This person must remain in the waiting room during your procedure.  Wear loose fitting clothing that is easily removed.  Leave jewelry and other valuables at home.  However, you may wish to bring a book to read or  an iPod/MP3 player to listen to music as you wait for your procedure to start.  Remove all body piercing jewelry and leave at home.  Total time from sign-in until discharge is approximately 2-3 hours.  You should go home directly after your procedure and rest.  You can resume normal activities the  day after your procedure.  The day of your procedure you should not:   Drive   Make legal decisions   Operate machinery   Drink alcohol   Return to work  You will receive specific instructions about eating, activities and medications before you leave.    The above instructions have been reviewed and explained to me by  Wyona Almas RN  September 03, 2009 8:59 AM     I fully understand and can verbalize  these instructions _____________________________ Date _________

## 2010-03-12 NOTE — Progress Notes (Signed)
Summary: Pt req script for Omeprazole and Simvastatin via [facility]  Phone Note Refill Request Call back at Work Phone [phone]   Refills Requested: Medication #1:  SIMVASTATIN 20 MG  TABS 1 qhs   Dosage confirmed as above?Dosage Confirmed   Supply Requested: 3 months  Medication #2:  OMEPRAZOLE 20 MG TBEC take on tab two times a day   Dosage confirmed as above?Dosage Confirmed   Supply Requested: 3 months Pt would like to get these 2 meds sent to [facility]. Fax# [phone]   90 day supply on both meds. Pts insurance has changed to [facility] HMO enhanced.    Method Requested: Fax to [facility] Fax [phone] Initial call taken by: Lucy Antigua,  February 12, 2010 3:24 PM    Prescriptions: SIMVASTATIN 20 MG  TABS (SIMVASTATIN) 1 qhs  #100 x 3   Entered by:   Kern Reap CMA (AAMA)   Authorized by:   Roderick Pee MD   Signed by:   Kern Reap CMA (AAMA) on 02/13/2010   Method used:   Faxed to ...       [facility] (mail-order)             , [place]         Ph: [phone]       Fax: [phone]   RxID:   [id number] OMEPRAZOLE 20 MG TBEC (OMEPRAZOLE) take on tab two times a day  #200 x 3   Entered by:   Kern Reap CMA (AAMA)   Authorized by:   Roderick Pee MD   Signed by:   Kern Reap CMA (AAMA) on 02/13/2010   Method used:   Faxed to ...       [facility] (mail-order)             , [place]         Ph: [phone]       Fax: [phone]   RxID:   [id number]

## 2010-03-15 ENCOUNTER — Inpatient Hospital Stay (INDEPENDENT_AMBULATORY_CARE_PROVIDER_SITE_OTHER)
Admission: RE | Admit: 2010-03-15 | Discharge: 2010-03-15 | Disposition: A | Discharge status: Home / Self Care | Payer: Medicare Other | Source: Ambulatory Visit | Attending: Family Medicine | Admitting: Family Medicine

## 2010-03-15 DIAGNOSIS — J019 Acute sinusitis, unspecified: Secondary | ICD-10-CM

## 2010-03-15 LAB — POCT URINALYSIS DIPSTICK
Bilirubin Urine: NEGATIVE
Hgb urine dipstick: NEGATIVE
Ketones, ur: NEGATIVE mg/dL
Nitrite: NEGATIVE
Protein, ur: NEGATIVE mg/dL
Specific Gravity, Urine: 1.01 (ref 1.005–1.030)
Urine Glucose, Fasting: NEGATIVE mg/dL
Urobilinogen, UA: 0.2 mg/dL (ref 0.0–1.0)
pH: 7 (ref 5.0–8.0)

## 2010-05-06 DIAGNOSIS — F39 Unspecified mood [affective] disorder: Secondary | ICD-10-CM

## 2010-05-06 DIAGNOSIS — F079 Unspecified personality and behavioral disorder due to known physiological condition: Secondary | ICD-10-CM

## 2010-06-26 NOTE — Cardiovascular Report (Signed)
   NAME:  [name], [name]                         ACCOUNT NO.:  [id number]   MEDICAL RECORD NO.:  [id number]                   PATIENT TYPE:  OIB   LOCATION:  2854                                 FACILITY:  [facility]   PHYSICIAN:  W. Ashley Royalty., M.D.         DATE OF BIRTH:  [birth year]   DATE OF PROCEDURE:  07/30/2002  DATE OF DISCHARGE:                              CARDIAC CATHETERIZATION   HISTORY:  A 70 year old male with positive family history of heart disease,  low HDL cholesterol and elevated homocystine level who has an abnormal  Cardiolite scan and exertional chest tightness and dyspnea.   PROCEDURE:  Left heart catheterization with coronary angiograms and left  ventriculogram.   The patient tolerated the procedure well without complications and good  hemostasis of peripheral pulses present at the end of the case.  The case  was done through the right femoral artery through a single anterior needle  wall stick through 6 French catheters.  He tolerated the procedure well.   HEMODYNAMIC DATA:  Aorta postcontrast:  110/60.  LV postcontrast 110/7-12.   ANGIOGRAPHIC DATA:  Left ventriculogram performed in the 30-degree RAO  projection.  The aortic valve was normal.  The mitral valve was normal.  The  left ventricle appears normal in size.  The estimated ejection fraction is  60%.  Coronary arteries arise and distribute normally. The left main  coronary artery is normal.  Left anterior descending has calcification in  its proximal portion with mild irregularity in the LAD itself.  The large  diagonal branch has a 30% proximal circumflex.  Circumflex coronary artery  is somewhat small.  It contains two small marginal arteries which are free  of disease.  Right coronary artery is a large dominant vessel with a  posterior descending and posterior lateral branch.  There is calcification  proximally with mild irregularity.   IMPRESSION:  1. Mild coronary artery disease with  calcification predominantly in the left     anterior descending and to the right coronary artery.  No severe focal     stenoses noted, but mild irregularities present.  2. Normal left ventricular function.   RECOMMENDATIONS:  Continued medical therapy with treatment of homocystine  with folate and address low HDL cholesterol with niacin if possible.                                               Darden Palmer., M.D.    WST/MEDQ  D:  07/30/2002  T:  07/30/2002  Job:  [id number]  Titus Dubin. Alwyn Ren, M.D. [facility]   cc:   Titus Dubin. Alwyn Ren, M.D. [facility]

## 2010-11-13 LAB — POCT RAPID STREP A: Streptococcus, Group A Screen (Direct): NEGATIVE

## 2011-01-08 ENCOUNTER — Other Ambulatory Visit (INDEPENDENT_AMBULATORY_CARE_PROVIDER_SITE_OTHER): Payer: Medicare Other

## 2011-01-08 DIAGNOSIS — E785 Hyperlipidemia, unspecified: Secondary | ICD-10-CM

## 2011-01-08 DIAGNOSIS — Z0000 Encounter for general adult medical examination without abnormal findings: Secondary | ICD-10-CM

## 2011-01-08 DIAGNOSIS — Z125 Encounter for screening for malignant neoplasm of prostate: Secondary | ICD-10-CM

## 2011-01-08 LAB — CBC WITH DIFFERENTIAL/PLATELET
Basophils Absolute: 0 10*3/uL (ref 0.0–0.1)
Basophils Relative: 0.5 % (ref 0.0–3.0)
Eosinophils Absolute: 0.2 10*3/uL (ref 0.0–0.7)
Eosinophils Relative: 4.5 % (ref 0.0–5.0)
HCT: 29.8 % — ABNORMAL LOW (ref 39.0–52.0)
Hemoglobin: 9.7 g/dL — ABNORMAL LOW (ref 13.0–17.0)
Lymphocytes Relative: 34.5 % (ref 12.0–46.0)
Lymphs Abs: 1.3 10*3/uL (ref 0.7–4.0)
MCHC: 32.6 g/dL (ref 30.0–36.0)
MCV: 73.8 fl — ABNORMAL LOW (ref 78.0–100.0)
Monocytes Absolute: 0.3 10*3/uL (ref 0.1–1.0)
Monocytes Relative: 8.3 % (ref 3.0–12.0)
Neutro Abs: 2 10*3/uL (ref 1.4–7.7)
Neutrophils Relative %: 52.2 % (ref 43.0–77.0)
Platelets: 194 10*3/uL (ref 150.0–400.0)
RBC: 4.03 Mil/uL — ABNORMAL LOW (ref 4.22–5.81)
RDW: 14.7 % — ABNORMAL HIGH (ref 11.5–14.6)
WBC: 3.8 10*3/uL — ABNORMAL LOW (ref 4.5–10.5)

## 2011-01-08 LAB — POCT URINALYSIS DIPSTICK
Bilirubin, UA: NEGATIVE
Blood, UA: NEGATIVE
Glucose, UA: NEGATIVE
Nitrite, UA: NEGATIVE
Protein, UA: NEGATIVE
Spec Grav, UA: 1.02
Urobilinogen, UA: 0.2
pH, UA: 5.5

## 2011-01-08 LAB — LIPID PANEL
Cholesterol: 119 mg/dL (ref 0–200)
HDL: 51.4 mg/dL (ref >39.00)
LDL Cholesterol: 59 mg/dL (ref 0–99)
Total CHOL/HDL Ratio: 2
Triglycerides: 42 mg/dL (ref 0.0–149.0)
VLDL: 8.4 mg/dL (ref 0.0–40.0)

## 2011-01-08 LAB — BASIC METABOLIC PANEL
BUN: 11 mg/dL (ref 6–23)
CO2: 27 mEq/L (ref 19–32)
Calcium: 9.1 mg/dL (ref 8.4–10.5)
Chloride: 112 mEq/L (ref 96–112)
Creatinine, Ser: 1.2 mg/dL (ref 0.4–1.5)
GFR: 65.66 mL/min (ref >60.00)
Glucose, Bld: 117 mg/dL — ABNORMAL HIGH (ref 70–99)
Potassium: 4.8 mEq/L (ref 3.5–5.1)
Sodium: 144 mEq/L (ref 135–145)

## 2011-01-08 LAB — HEPATIC FUNCTION PANEL
ALT: 22 U/L (ref 0–53)
AST: 20 U/L (ref 0–37)
Albumin: 3.7 g/dL (ref 3.5–5.2)
Alkaline Phosphatase: 69 U/L (ref 39–117)
Bilirubin, Direct: 0.1 mg/dL (ref 0.0–0.3)
Total Bilirubin: 0.8 mg/dL (ref 0.3–1.2)
Total Protein: 6.6 g/dL (ref 6.0–8.3)

## 2011-01-08 LAB — PSA: PSA: 3.06 ng/mL (ref 0.10–4.00)

## 2011-01-08 LAB — TSH: TSH: 1.14 u[IU]/mL (ref 0.35–5.50)

## 2011-01-12 ENCOUNTER — Other Ambulatory Visit: Payer: Medicare Other

## 2011-01-19 ENCOUNTER — Encounter: Payer: Self-pay | Admitting: Family Medicine

## 2011-01-19 ENCOUNTER — Telehealth: Payer: Self-pay | Admitting: Gastroenterology

## 2011-01-19 ENCOUNTER — Ambulatory Visit (INDEPENDENT_AMBULATORY_CARE_PROVIDER_SITE_OTHER): Payer: Medicare Other | Admitting: Family Medicine

## 2011-01-19 DIAGNOSIS — H908 Mixed conductive and sensorineural hearing loss, unspecified: Secondary | ICD-10-CM

## 2011-01-19 DIAGNOSIS — N529 Male erectile dysfunction, unspecified: Secondary | ICD-10-CM

## 2011-01-19 DIAGNOSIS — E785 Hyperlipidemia, unspecified: Secondary | ICD-10-CM

## 2011-01-19 DIAGNOSIS — Z79899 Other long term (current) drug therapy: Secondary | ICD-10-CM

## 2011-01-19 DIAGNOSIS — R7309 Other abnormal glucose: Secondary | ICD-10-CM

## 2011-01-19 DIAGNOSIS — Z2911 Encounter for prophylactic immunotherapy for respiratory syncytial virus (RSV): Secondary | ICD-10-CM

## 2011-01-19 DIAGNOSIS — K219 Gastro-esophageal reflux disease without esophagitis: Secondary | ICD-10-CM

## 2011-01-19 DIAGNOSIS — K222 Esophageal obstruction: Secondary | ICD-10-CM

## 2011-01-19 DIAGNOSIS — D649 Anemia, unspecified: Secondary | ICD-10-CM

## 2011-01-19 DIAGNOSIS — Z23 Encounter for immunization: Secondary | ICD-10-CM

## 2011-01-19 DIAGNOSIS — I4891 Unspecified atrial fibrillation: Secondary | ICD-10-CM

## 2011-01-19 DIAGNOSIS — Z0000 Encounter for general adult medical examination without abnormal findings: Secondary | ICD-10-CM

## 2011-01-19 LAB — VITAMIN B12: Vitamin B-12: 255 pg/mL (ref 211–911)

## 2011-01-19 MED ORDER — SIMVASTATIN 20 MG PO TABS: 20.0000 mg | ORAL_TABLET | Freq: Every day | ORAL | Status: DC

## 2011-01-19 MED ORDER — OMEPRAZOLE 10 MG PO CPDR: 10.0000 mg | DELAYED_RELEASE_CAPSULE | Freq: Every day | ORAL | Status: DC

## 2011-01-19 MED ORDER — SILDENAFIL CITRATE 100 MG PO TABS: 100.0000 mg | ORAL_TABLET | Freq: Every day | ORAL | Status: DC | PRN

## 2011-01-19 NOTE — Patient Instructions (Addendum)
Continue your good health habits.  Return in one year, sooner if any problems  Stop the aspirin, and all aspirin products.  Labwork today to determine the etiology of urinemia.  Begin iron one twice daily.  Follow-up ASAP with Dr. Arlyce Dice, in   Return to see me in one month for follow-up

## 2011-01-19 NOTE — Telephone Encounter (Signed)
Pt was told by Dr. Tawanna Cooler that he needed to be seen asap for anemia. Pt scheduled to see Willette Cluster NP tomorrow at 8:30am. Pt aware of appt date and time.

## 2011-01-19 NOTE — Progress Notes (Signed)
Subjective:    Patient ID: [name], male    DOB: 1940/07/20, 70 y.o.   MRN: [id number]  HPI [name] is a 70 year old, married male, nonsmoker, who comes in today for a wellness exam.  Medicare because of a history of atrial fibrillation, hyperglycemia, without diabetes, hyperlipidemia, BPH, with outlet obstruction, hearing loss, and multiple other issues.  His medications reviewed in detail, and there have been no changes.  Except that we had referred him to a neurologist for symptoms of no energy and fatigue.  Dr. Anne Hahn did a complete workup, which was negative.  He referred him to a psychiatrist, who started him on Ritalin 10 mg daily.  He's been on it for a month and feels much better.  He gets routine eye care, bilateral hearing aids, regular dental care, colonoscopy, and GI, tetanus, 2004, Pneumovax 2010, seasonal flu shot, and shingles.  Today,  His cognitive function is normal.  He walks on a regular basis.  Home health safety reviewed.  No issues identified, no guns in the house, does have a healthcare power of attorney and living will.  Two grandchildren in [place] 51 months of age, in 70 years of age, that he now spends a lot of time with the   Review of Systems  Constitutional: Positive for fatigue.  HENT: Negative.   Eyes: Negative.   Respiratory: Negative.   Cardiovascular: Negative.   Gastrointestinal: Negative.   Genitourinary: Positive for difficulty urinating.  Musculoskeletal: Negative.   Skin: Negative.   Neurological: Negative.   Hematological: Negative.   Psychiatric/Behavioral: Negative.        Objective:   Physical Exam  Constitutional: He is oriented to person, place, and time. He appears well-developed and well-nourished.  HENT:  Head: Normocephalic and atraumatic.  Right Ear: External ear normal.  Left Ear: External ear normal.  Nose: Nose normal.  Mouth/Throat: Oropharynx is clear and moist.  Eyes: Conjunctivae and EOM are normal. Pupils  are equal, round, and reactive to light.  Neck: Normal range of motion. Neck supple. No JVD present. No tracheal deviation present. No thyromegaly present.  Cardiovascular: Normal rate, regular rhythm, normal heart sounds and intact distal pulses.  Exam reveals no gallop and no friction rub.   No murmur heard. Pulmonary/Chest: Effort normal and breath sounds normal. No stridor. No respiratory distress. He has no wheezes. He has no rales. He exhibits no tenderness.  Abdominal: Soft. Bowel sounds are normal. He exhibits no distension and no mass. There is no tenderness. There is no rebound and no guarding.  Genitourinary: Rectum normal and penis normal. Guaiac negative stool. No penile tenderness.       2+ symmetrical non-nodular, BPH  Musculoskeletal: Normal range of motion. He exhibits no edema and no tenderness.  Lymphadenopathy:    He has no cervical adenopathy.  Neurological: He is alert and oriented to person, place, and time. He has normal reflexes. No cranial nerve deficit. He exhibits normal muscle tone.  Skin: Skin is warm and dry. No rash noted. No erythema. No pallor.  Psychiatric: He has a normal mood and affect. His behavior is normal. Judgment and thought content normal.          Assessment & Plan:  Healthy male.  History of reflux esophagitis.  Continue Prilosec 20 daily.  History of hypertension.  Continue simvastatin 20 mg daily.  History of erectile dysfunction continue Viagra p.r.n.  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  Continue OTC saw palmetto.  Fatigue.  Continue the Ritalin at the direction  of your psychiatrist.  History of atrial fibrillation, currently in sinus rhythm, and  cardiac follow-up by Dr. Donnie Aho

## 2011-01-20 ENCOUNTER — Encounter: Payer: Self-pay | Admitting: Nurse Practitioner

## 2011-01-20 ENCOUNTER — Ambulatory Visit (INDEPENDENT_AMBULATORY_CARE_PROVIDER_SITE_OTHER): Payer: Medicare Other | Admitting: Nurse Practitioner

## 2011-01-20 ENCOUNTER — Encounter: Payer: Self-pay | Admitting: Gastroenterology

## 2011-01-20 ENCOUNTER — Ambulatory Visit: Payer: Medicare Other

## 2011-01-20 VITALS — BP 112/68 | HR 72 | Ht 74.0 in | Wt 178.0 lb

## 2011-01-20 DIAGNOSIS — D509 Iron deficiency anemia, unspecified: Secondary | ICD-10-CM | POA: Insufficient documentation

## 2011-01-20 LAB — RETICULOCYTES: ABS Retic: 50.6 10*3/uL (ref 19.0–186.0)

## 2011-01-20 NOTE — Progress Notes (Signed)
Reviewed and agree with plans for EGD

## 2011-01-20 NOTE — Patient Instructions (Signed)
Please go to the basement level to have your labs drawn.   We have scheduled the Endoscopy with Dr. Melvia Heaps for Mon 01-25-2011. Directions and description of procedure provided.Upper GI Endoscopy.  Upper GI endoscopy means using a flexible scope to look at the esophagus, stomach, and upper small bowel. This is done to make a diagnosis in people with heartburn, abdominal pain, or abnormal bleeding. Sometimes an endoscope is needed to remove foreign bodies or food that become stuck in the esophagus; it can also be used to take biopsy samples. For the best results, do not eat or drink for 8 hours before having your upper endoscopy.  To perform the endoscopy, you will probably be sedated and your throat will be numbed with a special spray. The endoscope is then slowly passed down your throat (this will not interfere with your breathing). An endoscopy exam takes 15 to 30 minutes to complete and there is no real pain. Patients rarely remember much about the procedure. The results of the test may take several days if a biopsy or other test is taken.  You may have a sore throat after an endoscopy exam. Serious complications are very rare. Stick to liquids and soft foods until your pain is better. Do not drive a car or operate any dangerous equipment for at least 24 hours after being sedated. SEEK IMMEDIATE MEDICAL CARE IF:   You have severe throat pain.   You have shortness of breath.   You have bleeding problems.   You have a fever.   You have difficulty recovering from your sedation.  Document Released: 03/04/2004 Document Revised: 10/07/2010 Document Reviewed: 01/28/2008 [facility] Patient Information 2012 [place], [place].

## 2011-01-20 NOTE — Assessment & Plan Note (Addendum)
Iron deficiency anemia, new. No overt GI blood loss, heme negative today.  Patient is current on his colon cancer screening, in fact he had a complete colonoscopy in August 2011. Will obtain labs to rule out celiac disease. If negative we will need to exclude GI blood loss and will start with upper endoscopy. Though hemoglobin fell almost 4 grams over the last year it should be noted that it has been gradually falling since around 2009. It should also be noted that patient's WBC count has also drifted over the last four years, down from 4.5 four years ago to 3.8 at present. His platelets have been in the low normal range over the last few years. The benefits, risks, and potential complications of EGD with possible biopsies were discussed with the patient and he agrees to proceed.

## 2011-01-20 NOTE — Progress Notes (Signed)
Quick Note:  Pt aware and has been see by GI and has test set up. ______

## 2011-01-20 NOTE — Progress Notes (Signed)
[name] [id number] 11/27/1940   HISTORY OR PRESENT ILLNESS : Patient is a 70 year old male known to Dr. Arlyce Dice for history of esophageal stricture, a family history of colon cancer and a personal history of adenomatous colon polyps. Patient was last seen in August 2011 at time of his colonoscopy which was done for rectal bleeding and positive family history. Patient recently had his annual physical exam at which time routine blood work found him to have iron deficiency anemia. Patient denies any overt GI blood loss. No gastrointestinal complaints such as nausea, abdominal pain, or bowel changes. Weight is stable, any weight loss has been intentional. Patient takes a daily baby aspirin but has done so for years. No other NSAIDs. He does take daily omeprazole for history of esophageal strictures.   LABS:  Labs last week pertinent for hemoglobin 9.7, MCV 73, WBC 3.8. His LFTs were normal. Renal function normal. Additional labs drawn yesterday reveal a normal folate, B12 was low normal at 255, ferritin 5.1, serum iron 35, nornal reticulocyte count.  Current Medications, Allergies, Past Medical History, Past Surgical History, Family History and Social History were reviewed in [employer] record.   PHYSICAL EXAMINATION : General: Well developed white male in no acute distress Head: Normocephalic and atraumatic Eyes:  sclerae anicteric,conjunctive pink. Ears: Normal auditory acuity Mouth: No deformity or lesions Neck: Supple, no masses.  Lungs: Clear throughout to auscultation Heart: Regular rate and rhythm; no murmurs heard Abdomen: Soft, nondistended, nontender. No masses or hepatomegaly noted. Normal bowel sounds Rectal: Brown, Hemoccult-negative stool. Musculoskeletal: Symmetrical with no gross deformities  Skin: No lesions on visible extremities Extremities: No edema or deformities noted Neurological: Alert oriented x 4, grossly nonfocal Cervical Nodes:  No  significant cervical adenopathy Psychological:  Alert and cooperative. Normal mood and affect  ASSESSMENT AND PLAN :

## 2011-01-21 LAB — TISSUE TRANSGLUTAMINASE, IGA: Tissue Transglutaminase Ab, IgA: 2.9 U/mL (ref <20)

## 2011-01-25 ENCOUNTER — Ambulatory Visit (AMBULATORY_SURGERY_CENTER): Payer: Medicare Other | Admitting: Gastroenterology

## 2011-01-25 ENCOUNTER — Encounter: Payer: Self-pay | Admitting: Gastroenterology

## 2011-01-25 VITALS — BP 120/58 | HR 72 | Temp 97.5°F | Resp 20 | Ht 74.0 in | Wt 178.0 lb

## 2011-01-25 DIAGNOSIS — D509 Iron deficiency anemia, unspecified: Secondary | ICD-10-CM

## 2011-01-25 DIAGNOSIS — D649 Anemia, unspecified: Secondary | ICD-10-CM

## 2011-01-25 MED ORDER — SODIUM CHLORIDE 0.9 % IV SOLN: 500.0000 mL | INTRAVENOUS | Status: DC

## 2011-01-25 NOTE — Op Note (Signed)
[facility] [address] [place], [place]  [address]  ENDOSCOPY PROCEDURE REPORT  PATIENT:  [name], [name]  MR#:  [id number] BIRTHDATE:  02-16-1940, 70 yrs. old  GENDER:  male  ENDOSCOPIST:  Barbette Hair. Arlyce Dice, MD Referred by:  PROCEDURE DATE:  01/25/2011 PROCEDURE:  EGD with biopsy, 43239 ASA CLASS:  Class II INDICATIONS:  iron deficiency anemia  MEDICATIONS:   These medications were titrated to patient response per physician's verbal order, Fentanyl 75 mcg IV, Versed 6 mg IV, glycopyrrolate (Robinal) 0.2 mg IV TOPICAL ANESTHETIC:  Cetacaine Spray  DESCRIPTION OF PROCEDURE:   After the risks and benefits of the procedure were explained, informed consent was obtained.  The [facility] [id number] endoscope was introduced through the mouth and advanced to the third portion of the duodenum.  The instrument was slowly withdrawn as the mucosa was fully examined. <<PROCEDUREIMAGES>>  The upper, middle, and distal third of the esophagus were carefully inspected and no abnormalities were noted. The z-line was well seen at the GEJ. The endoscope was pushed into the fundus which was normal including a retroflexed view. The antrum,gastric body, first and second part of the duodenum were unremarkable (see image2, image4, and image1). Small bowel biopsies were taken to r/o celiac sprue    Retroflexed views revealed no abnormalities. The scope was then withdrawn from the patient and the procedure completed.  COMPLICATIONS:  None  ENDOSCOPIC IMPRESSION: 1) Normal EGD RECOMMENDATIONS: 1) capsule endoscopy 2) hemmoccult stools in 1 weeks  ______________________________ Barbette Hair. Arlyce Dice, MD  CC:  Roderick Pee, MD  n. Rosalie Doctor:   Barbette Hair. Vinal Rosengrant at 01/25/2011 09:26 AM  Conchita Paris, [phone]

## 2011-01-25 NOTE — Progress Notes (Signed)
Patient did not experience any of the following events: a burn prior to discharge; a fall within the facility; wrong site/side/patient/procedure/implant event; or a hospital transfer or hospital admission upon discharge from the facility. (G8907) Patient did not have preoperative order for IV antibiotic SSI prophylaxis. (G8918)  

## 2011-01-26 ENCOUNTER — Telehealth: Payer: Self-pay | Admitting: *Deleted

## 2011-01-26 ENCOUNTER — Telehealth: Payer: Self-pay | Admitting: Gastroenterology

## 2011-01-26 DIAGNOSIS — D649 Anemia, unspecified: Secondary | ICD-10-CM

## 2011-01-26 NOTE — Telephone Encounter (Signed)
Pt scheduled for capsule teaching 02/15/11@1 :30pm. Capsule endo scheduled for 02/17/11@8am . Pt aware of appt date and time.

## 2011-01-26 NOTE — Telephone Encounter (Signed)
Left message for pt to call back  °

## 2011-01-26 NOTE — Telephone Encounter (Signed)
Follow up Call- Patient questions:  Do you have a fever, pain , or abdominal swelling? no Pain Score  0 *  Have you tolerated food without any problems? yes  Have you been able to return to your normal activities? yes  Do you have any questions about your discharge instructions: Diet   no Medications  no Follow up visit  no  Do you have questions or concerns about your Care? yes  Actions: * If pain score is 4 or above: No action needed, pain <4.   

## 2011-02-01 ENCOUNTER — Other Ambulatory Visit: Payer: Self-pay | Admitting: Gastroenterology

## 2011-02-01 ENCOUNTER — Other Ambulatory Visit: Payer: Medicare Other

## 2011-02-01 DIAGNOSIS — Z1211 Encounter for screening for malignant neoplasm of colon: Secondary | ICD-10-CM

## 2011-02-01 LAB — HEMOCCULT SLIDES (X 3 CARDS)
OCCULT 1: NEGATIVE
OCCULT 2: NEGATIVE
OCCULT 3: NEGATIVE

## 2011-02-03 ENCOUNTER — Telehealth: Payer: Self-pay

## 2011-02-03 NOTE — Progress Notes (Signed)
FOBT negative.  Please call the patient.  Given EGD recently and colonoscopy Aug 2011 I don't think further invasive testing is needed.

## 2011-02-03 NOTE — Telephone Encounter (Signed)
Spoke with pt and he is aware of FOBT results.

## 2011-02-05 ENCOUNTER — Encounter: Payer: Self-pay | Admitting: Internal Medicine

## 2011-02-09 DIAGNOSIS — G629 Polyneuropathy, unspecified: Secondary | ICD-10-CM | POA: Insufficient documentation

## 2011-02-09 HISTORY — PX: COLON SURGERY: SHX602

## 2011-02-09 HISTORY — DX: Polyneuropathy, unspecified: G62.9

## 2011-02-15 ENCOUNTER — Ambulatory Visit (INDEPENDENT_AMBULATORY_CARE_PROVIDER_SITE_OTHER): Payer: Medicare Other | Admitting: Gastroenterology

## 2011-02-15 DIAGNOSIS — D500 Iron deficiency anemia secondary to blood loss (chronic): Secondary | ICD-10-CM

## 2011-02-15 NOTE — Progress Notes (Signed)
Patient here for capsule endo teaching.  He verbalized understanding of all written and verbal instructions for capsule endo.

## 2011-02-16 ENCOUNTER — Ambulatory Visit: Payer: Medicare Other | Admitting: Family Medicine

## 2011-02-17 ENCOUNTER — Ambulatory Visit (INDEPENDENT_AMBULATORY_CARE_PROVIDER_SITE_OTHER): Payer: Medicare Other | Admitting: Gastroenterology

## 2011-02-17 DIAGNOSIS — D500 Iron deficiency anemia secondary to blood loss (chronic): Secondary | ICD-10-CM

## 2011-02-17 NOTE — Progress Notes (Signed)
Patient here for capsule endoscopy for Dr. Arlyce Dice. Patient verbalizes understanding of all verbal and written instructions. Patient has been NPO and did the prep required for the procedure. Patient swallowed the pill without difficulty. Capsule lot number- [id number].

## 2011-02-18 ENCOUNTER — Encounter: Payer: Self-pay | Admitting: Gastroenterology

## 2011-02-18 ENCOUNTER — Ambulatory Visit: Payer: Medicare Other | Admitting: Internal Medicine

## 2011-02-18 NOTE — Progress Notes (Signed)
Patient ID: [name], male   DOB: 1940/06/27, 71 y.o.   MRN: [id number] Capsule endoscopy demonstrates areas of marked erythema in the cecum. Visualization was obscured because of retained liquid stool.  We'll proceed with repeat colonoscopy

## 2011-02-19 ENCOUNTER — Encounter: Payer: Self-pay | Admitting: Gastroenterology

## 2011-02-23 ENCOUNTER — Encounter: Payer: Self-pay | Admitting: Family Medicine

## 2011-02-23 ENCOUNTER — Ambulatory Visit (INDEPENDENT_AMBULATORY_CARE_PROVIDER_SITE_OTHER): Payer: Medicare Other | Admitting: Family Medicine

## 2011-02-23 DIAGNOSIS — D649 Anemia, unspecified: Secondary | ICD-10-CM

## 2011-02-23 DIAGNOSIS — D509 Iron deficiency anemia, unspecified: Secondary | ICD-10-CM

## 2011-02-23 MED ORDER — OMEPRAZOLE 10 MG PO CPDR: 10.0000 mg | DELAYED_RELEASE_CAPSULE | Freq: Two times a day (BID) | ORAL | Status: DC

## 2011-02-23 NOTE — Progress Notes (Signed)
  Subjective:    Patient ID: [name], male    DOB: [birth year], 71 y.o.   MRN: [id number]  HPI [name] is a 71 year old male, who comes back today for reevaluation of anemia.  We saw him in December for Medicare wellness examination at that time.  He was anemic.  Diagnostic studies were normal except for low iron.  He was started on iron and now his hemoglobin is 13.9.  He underwent GI studies.  Upper endoscopy and small bowel normal.  He's due for a follow-up colonoscopy by Dr. Arlyce Dice.  He states he feels well, and no symptoms.  He was taking aspirin daily on a long-term basis, and this may have been the source of his anemia if his colonoscopy is normal.   Review of Systems Genital and GI and hematologic review of systems otherwise negative    Objective:   Physical Exam Well-developed well-nourished, male in no acute distress, hemoglobin 13.9       Assessment & Plan:  Anemia probably secondary to GI blood loss from aspirin.  Plan continue to avoid aspirin, one iron tablet daily for 3 months follow-up colonoscopy and GI as outlined by Dr. Arlyce Dice

## 2011-02-23 NOTE — Patient Instructions (Signed)
Take one iron tablet at bedtime for 3 months, then stop.  Return to see me next December for your annual Medicare wellness examination.  Avoid aspirin and all aspirin products.  Follow-up colonoscopy as outlined by Dr. Arlyce Dice

## 2011-02-25 ENCOUNTER — Telehealth: Payer: Self-pay

## 2011-02-25 NOTE — Telephone Encounter (Signed)
Pt scheduled for previsit for 02/26/11@9am . Pt scheduled for colon with Dr. Arlyce Dice 03/03/11@3pm . Pt aware of appt dates and times.

## 2011-02-25 NOTE — Telephone Encounter (Signed)
Message copied by Michele Mcalpine on Thu Feb 25, 2011 10:25 AM ------      Message from: Melvia Heaps D      Created: Tue Feb 23, 2011  8:32 AM       Anytime      ----- Message -----         From: Lily Lovings, RN         Sent: 02/22/2011   3:02 PM           To: Louis Meckel, MD            How soon do you want to do the colon on this pt?      ----- Message -----         From: Louis Meckel, MD         Sent: 02/18/2011   2:42 PM           To: Lily Lovings, RN            Please inform patient that there were some spotty areas in the cecum that looked irritated and inflamed and I want to repeat his colonoscopy

## 2011-02-26 ENCOUNTER — Ambulatory Visit (AMBULATORY_SURGERY_CENTER): Payer: Medicare Other

## 2011-02-26 ENCOUNTER — Encounter: Payer: Self-pay | Admitting: Gastroenterology

## 2011-02-26 VITALS — Ht 74.0 in | Wt 180.0 lb

## 2011-02-26 DIAGNOSIS — D509 Iron deficiency anemia, unspecified: Secondary | ICD-10-CM

## 2011-02-26 MED ORDER — PEG-KCL-NACL-NASULF-NA ASC-C 100 G PO SOLR: 1.0000 | Freq: Once | ORAL | Status: DC

## 2011-03-01 ENCOUNTER — Telehealth: Payer: Self-pay | Admitting: Family Medicine

## 2011-03-01 MED ORDER — OMEPRAZOLE 10 MG PO CPDR: 10.0000 mg | DELAYED_RELEASE_CAPSULE | Freq: Two times a day (BID) | ORAL | Status: DC

## 2011-03-01 NOTE — Telephone Encounter (Signed)
Please resend Omeprazole to [facility]----fax--[phone]. He takes bid. Thanks.

## 2011-03-03 ENCOUNTER — Encounter: Payer: Self-pay | Admitting: Gastroenterology

## 2011-03-03 ENCOUNTER — Other Ambulatory Visit: Payer: Self-pay | Admitting: Gastroenterology

## 2011-03-03 ENCOUNTER — Telehealth: Payer: Self-pay

## 2011-03-03 ENCOUNTER — Ambulatory Visit (AMBULATORY_SURGERY_CENTER): Payer: Medicare Other | Admitting: Gastroenterology

## 2011-03-03 DIAGNOSIS — C180 Malignant neoplasm of cecum: Secondary | ICD-10-CM

## 2011-03-03 DIAGNOSIS — D126 Benign neoplasm of colon, unspecified: Secondary | ICD-10-CM

## 2011-03-03 DIAGNOSIS — K6389 Other specified diseases of intestine: Secondary | ICD-10-CM

## 2011-03-03 DIAGNOSIS — K5730 Diverticulosis of large intestine without perforation or abscess without bleeding: Secondary | ICD-10-CM

## 2011-03-03 DIAGNOSIS — D509 Iron deficiency anemia, unspecified: Secondary | ICD-10-CM

## 2011-03-03 DIAGNOSIS — C182 Malignant neoplasm of ascending colon: Secondary | ICD-10-CM | POA: Insufficient documentation

## 2011-03-03 HISTORY — DX: Malignant neoplasm of ascending colon: C18.2

## 2011-03-03 NOTE — Progress Notes (Signed)
Dr Arlyce Dice was asked if pt needs labs since had done 01-08-11 and he stated "no not right now". No labs ordered. ewm  Dr Nita Sells office staff called [phone] after pt discharged stateng labs done 11-30 not good. Pt has been discharged to home. Office will notify pt.ewm

## 2011-03-03 NOTE — Telephone Encounter (Signed)
Message copied by Michele Mcalpine on Wed Mar 03, 2011  4:52 PM ------      Message from: Melvia Heaps D      Created: Wed Mar 03, 2011  4:19 PM       Bonita Quin,      Pt needs app't with Dr. Donell Beers for colon Ca, CT of abd/pelvis

## 2011-03-03 NOTE — Op Note (Signed)
[facility] [address] [place], [place]  [address]  COLONOSCOPY PROCEDURE REPORT  PATIENT:  [name], [name]  MR#:  [id number] BIRTHDATE:  [birth year], 70 yrs. old  GENDER:  male ENDOSCOPIST:  Barbette Hair. Arlyce Dice, MD REF. BY: PROCEDURE DATE:  03/03/2011 PROCEDURE:  Colonoscopy with biopsy, Colonoscopy with snare polypectomy ASA CLASS:  Class II INDICATIONS:  Iron deficiency anemia MEDICATIONS:   MAC sedation, administered by CRNA propofol 300mg IV  DESCRIPTION OF PROCEDURE:   After the risks benefits and alternatives of the procedure were thoroughly explained, informed consent was obtained.  Digital rectal exam was performed and revealed no abnormalities.   The [id number] endoscope was introduced through the anus and advanced to the cecum, which was identified by both the appendix and ileocecal valve, without limitations.  The quality of the prep was good, using MoviPrep. The instrument was then slowly withdrawn as the colon was fully examined. <<PROCEDUREIMAGES>>  FINDINGS:  A mass was found at the ileocecal valve. Circumferential, ulcerated mass with friable mucosa. Bxs taken (see image5 and image6).l  A sessile polyp was found in the descending colon. It was 3 mm in size. Polyp was snared without cautery. Retrieval was successful (see image2). snare polyp Moderate diverticulosis was found in the sigmoid colon (see image7).  This was otherwise a normal examination of the colon (see image3). Photodocumentation of rectum not available Retroflexed views in the rectum revealed no abnormalities.    The time to cecum =  1) 8.75  minutes. The scope was then withdrawn in 1) 10.25  minutes from the cecum and the procedure completed. COMPLICATIONS:  None ENDOSCOPIC IMPRESSION: 1) Mass at the ileocecal valve 2) 3 mm sessile polyp in the descending colon 3) Moderate diverticulosis in the sigmoid colon 4) Otherwise normal examination RECOMMENDATIONS: 1) Surgery 2) My  office will arrange for you to have a CT scan of abdomen and pelvis. 3) Colonoscopy 1 year REPEAT EXAM:  No  ______________________________ Barbette Hair. Arlyce Dice, MD  CC:  Roderick Pee, MDFaera Donell Beers, MD  n. Rosalie Doctor:   Barbette Hair. Bowie Delia at 03/03/2011 04:00 PM  Conchita Paris, [phone]

## 2011-03-03 NOTE — Patient Instructions (Addendum)
Discharge instructions per blue and green sheets Dr Marzetta Board office will arrange a surgery consult and call you with this date and time  Diverticulosis Diverticulosis is a common condition that develops when small pouches (diverticula) form in the wall of the colon. The risk of diverticulosis increases with age. It happens more often in people who eat a low-fiber diet. Most individuals with diverticulosis have no symptoms. Those individuals with symptoms usually experience abdominal pain, constipation, or loose stools (diarrhea). HOME CARE INSTRUCTIONS   Increase the amount of fiber in your diet as directed by your caregiver or dietician. This may reduce symptoms of diverticulosis.   Your caregiver may recommend taking a dietary fiber supplement.   Drink at least 6 to 8 glasses of water each day to prevent constipation.   Try not to strain when you have a bowel movement.   Your caregiver may recommend avoiding nuts and seeds to prevent complications, although this is still an uncertain benefit.   Only take over-the-counter or prescription medicines for pain, discomfort, or fever as directed by your caregiver.  FOODS WITH HIGH FIBER CONTENT INCLUDE:  Fruits. Apple, peach, pear, tangerine, raisins, prunes.   Vegetables. Brussels sprouts, asparagus, broccoli, cabbage, carrot, cauliflower, romaine lettuce, spinach, summer squash, tomato, winter squash, zucchini.   Starchy Vegetables. Baked beans, kidney beans, lima beans, split peas, lentils, potatoes (with skin).   Grains. Whole wheat bread, brown rice, bran flake cereal, plain oatmeal, white rice, shredded wheat, bran muffins.  SEEK IMMEDIATE MEDICAL CARE IF:   You develop increasing pain or severe bloating.   You have an oral temperature above 102 F (38.9 C), not controlled by medicine.   You develop vomiting or bowel movements that are bloody or black.  Document Released: 10/23/2003 Document Revised: 10/07/2010 Document Reviewed:  06/25/2009 [facility] Patient Information 2012 [place], [place].   Dr Marzetta Board office will arrange for you to have a CT scan of the abdomen and pelvis and call you with the date and time Handouts given on diverticulosis and high fiber diet  Colon Polyps A polyp is extra tissue that grows inside your body. Colon polyps grow in the large intestine. The large intestine, also called the colon, is part of your digestive system. It is a long, hollow tube at the end of your digestive tract where your body makes and stores stool. Most polyps are not dangerous. They are benign. This means they are not cancerous. But over time, some types of polyps can turn into cancer. Polyps that are smaller than a pea are usually not harmful. But larger polyps could someday become or may already be cancerous. To be safe, doctors remove all polyps and test them.  WHO GETS POLYPS? Anyone can get polyps, but certain people are more likely than others. You may have a greater chance of getting polyps if:  You are over 50.   You have had polyps before.   Someone in your family has had polyps.   Someone in your family has had cancer of the large intestine.   Find out if someone in your family has had polyps. You may also be more likely to get polyps if you:   Eat a lot of fatty foods.   Smoke.   Drink alcohol.   Do not exercise.   Eat too much.  SYMPTOMS  Most small polyps do not cause symptoms. People often do not know they have one until their caregiver finds it during a regular checkup or while testing them for something  else. Some people do have symptoms like these:  Bleeding from the anus. You might notice blood on your underwear or on toilet paper after you have had a bowel movement.   Constipation or diarrhea that lasts more than a week.   Blood in the stool. Blood can make stool look black or it can show up as red streaks in the stool.  If you have any of these symptoms, see your caregiver. HOW DOES  THE DOCTOR TEST FOR POLYPS? The doctor can use four tests to check for polyps:  Digital rectal exam. The caregiver wears gloves and checks your rectum (the last part of the large intestine) to see if it feels normal. This test would find polyps only in the rectum. Your caregiver may need to do one of the other tests listed below to find polyps higher up in the intestine.   Barium enema. The caregiver puts a liquid called barium into your rectum before taking x-rays of your large intestine. Barium makes your intestine look white in the pictures. Polyps are dark, so they are easy to see.   Sigmoidoscopy. With this test, the caregiver can see inside your large intestine. A thin flexible tube is placed into your rectum. The device is called a sigmoidoscope, which has a light and a tiny video camera in it. The caregiver uses the sigmoidoscope to look at the last third of your large intestine.   Colonoscopy. This test is like sigmoidoscopy, but the caregiver looks at all of the large intestine. It usually requires sedation. This is the most common method for finding and removing polyps.  TREATMENT   The caregiver will remove the polyp during sigmoidoscopy or colonoscopy. The polyp is then tested for cancer.   If you have had polyps, your caregiver may want you to get tested regularly in the future.  PREVENTION  There is not one sure way to prevent polyps. You might be able to lower your risk of getting them if you:  Eat more fruits and vegetables and less fatty food.   Do not smoke.   Avoid alcohol.   Exercise every day.   Lose weight if you are overweight.   Eating more calcium and folate can also lower your risk of getting polyps. Some foods that are rich in calcium are milk, cheese, and broccoli. Some foods that are rich in folate are chickpeas, kidney beans, and spinach.   Aspirin might help prevent polyps. Studies are under way.  Document Released: 10/22/2003 Document Revised:  10/07/2010 Document Reviewed: 03/29/2007 [facility] Patient Information 2012 [place], [place].

## 2011-03-03 NOTE — Progress Notes (Signed)
Patient did not experience any of the following events: a burn prior to discharge; a fall within the facility; wrong site/side/patient/procedure/implant event; or a hospital transfer or hospital admission upon discharge from the facility. (G8907) Patient did not have preoperative order for IV antibiotic SSI prophylaxis. (G8918)  

## 2011-03-04 ENCOUNTER — Other Ambulatory Visit (INDEPENDENT_AMBULATORY_CARE_PROVIDER_SITE_OTHER): Payer: Medicare Other

## 2011-03-04 ENCOUNTER — Telehealth: Payer: Self-pay | Admitting: *Deleted

## 2011-03-04 ENCOUNTER — Telehealth: Payer: Self-pay

## 2011-03-04 DIAGNOSIS — K6389 Other specified diseases of intestine: Secondary | ICD-10-CM

## 2011-03-04 NOTE — Telephone Encounter (Signed)
Received call from [facility]. Appt with Dr. Donell Beers has been changed to 03/11/11 arrival time 4pm. Spoke with pt and they are aware, also let them know that [facility] has left them a message on their answering machine at home.

## 2011-03-04 NOTE — Telephone Encounter (Signed)
Message copied by Michele Mcalpine on Thu Mar 04, 2011  9:04 AM ------      Message from: Melvia Heaps D      Created: Wed Mar 03, 2011  4:19 PM       Bonita Quin,      Pt needs app't with Dr. Donell Beers for colon Ca, CT of abd/pelvis

## 2011-03-04 NOTE — Telephone Encounter (Signed)
Pt to come in today for labs. CT of abdomen and pelvis scheduled for 03/05/11@2pm  at [facility] CT. Pt to be NPO after 10am, drink 1st bottle of contrast at 12noon and the second bottle at 1pm. Pt scheduled for appt with Dr. Donell Beers 03/29/11 arrival time 11am for an 11:30am appt. Pt aware of appt dates and times.

## 2011-03-04 NOTE — Telephone Encounter (Signed)
No answer, message left to call if questions or concerns. 

## 2011-03-05 ENCOUNTER — Ambulatory Visit (INDEPENDENT_AMBULATORY_CARE_PROVIDER_SITE_OTHER)
Admission: RE | Admit: 2011-03-05 | Discharge: 2011-03-05 | Disposition: A | Discharge status: Home / Self Care | Payer: Medicare Other | Source: Ambulatory Visit | Attending: Gastroenterology | Admitting: Gastroenterology

## 2011-03-05 DIAGNOSIS — K6389 Other specified diseases of intestine: Secondary | ICD-10-CM

## 2011-03-05 LAB — BASIC METABOLIC PANEL
CO2: 27 mEq/L (ref 19–32)
Chloride: 107 mEq/L (ref 96–112)
Glucose, Bld: 107 mg/dL — ABNORMAL HIGH (ref 70–99)
Potassium: 4 mEq/L (ref 3.5–5.1)
Sodium: 139 mEq/L (ref 135–145)

## 2011-03-05 MED ORDER — IOHEXOL 300 MG/ML  SOLN
100.0000 mL | Freq: Once | INTRAMUSCULAR | Status: AC | PRN
  Administered 2011-03-05: 100 mL via INTRAVENOUS

## 2011-03-08 ENCOUNTER — Telehealth: Payer: Self-pay | Admitting: Gastroenterology

## 2011-03-08 ENCOUNTER — Telehealth: Payer: Self-pay | Admitting: Oncology

## 2011-03-08 NOTE — Telephone Encounter (Signed)
Pt is calling requesting results of CT scan that was done last week. Dr. Arlyce Dice please advise.

## 2011-03-08 NOTE — Telephone Encounter (Signed)
Duplicate message from 03-08-11

## 2011-03-08 NOTE — Telephone Encounter (Signed)
called pt and to schedule NEW PT appt and he asked what type of appt this would be.  Informed pt to call Dr. Tawanna Cooler office for info.  pt stated that he will call there office and let us know if he wants this appt

## 2011-03-09 NOTE — Telephone Encounter (Signed)
The CT scan identifies the mass in the colon. There is no obvious disease beyond the colon.

## 2011-03-09 NOTE — Telephone Encounter (Signed)
Spoke with pt and he is aware. 

## 2011-03-10 ENCOUNTER — Telehealth: Payer: Self-pay | Admitting: Gastroenterology

## 2011-03-10 ENCOUNTER — Telehealth: Payer: Self-pay | Admitting: *Deleted

## 2011-03-10 ENCOUNTER — Telehealth: Payer: Self-pay | Admitting: Oncology

## 2011-03-10 NOTE — Telephone Encounter (Signed)
called pt and he agreed to come in for appt on 02-21 @ 1pm.

## 2011-03-10 NOTE — Telephone Encounter (Signed)
Spoke with pt and he would like to see Dr. Abbey Chatters. Let him know Dr. Truett Perna specializes in colon and he would do well to stay with him. Called [facility] and the 1st available appt with Dr. Abbey Chatters is 03/31/11@9 :20am. Pt to arrive early. Dr. Arlyce Dice is this appt ok? Please advise.

## 2011-03-10 NOTE — Telephone Encounter (Signed)
Ideally he should be seen sooner since he has a cancer.  I will contact [name] who may facilitate a sooner app't.

## 2011-03-10 NOTE — Telephone Encounter (Signed)
Spoke with pt and will call him back when we hear regarding a sooner appt.  Bernie at [facility] scheduled the pt to see Dr. Abbey Chatters 03/22/11@11am . She will notify pt of appt date and time.

## 2011-03-11 ENCOUNTER — Ambulatory Visit (INDEPENDENT_AMBULATORY_CARE_PROVIDER_SITE_OTHER): Payer: Medicare Other | Admitting: General Surgery

## 2011-03-11 NOTE — Telephone Encounter (Signed)
Sent to me by mistake 

## 2011-03-12 HISTORY — PX: APPENDECTOMY: SHX54

## 2011-03-15 ENCOUNTER — Telehealth (INDEPENDENT_AMBULATORY_CARE_PROVIDER_SITE_OTHER): Payer: Self-pay

## 2011-03-15 NOTE — Telephone Encounter (Signed)
Dr Porfirio Mylar request pts appt for 2-11 be moved up to this week with Dr Abbey Chatters if possible. Pt can be reach at [phone]. I advised Dr Porfirio Mylar I would send msg to Dr Arne Cleveland assistant to see if we can move this appt up.

## 2011-03-15 NOTE — Telephone Encounter (Signed)
I spoke with the pt and Dr. Porfirio Mylar.  Will try to move patient up after talking with Dr. Abbey Chatters.  Will let Mr. Dornbush know.

## 2011-03-17 ENCOUNTER — Telehealth (INDEPENDENT_AMBULATORY_CARE_PROVIDER_SITE_OTHER): Payer: Self-pay

## 2011-03-17 NOTE — Telephone Encounter (Signed)
Spoke with [name] this morning to explain that 03/22/11 was the first available time for him to see Dr. Abbey Chatters.  I reassured him that we would try to schedule his surgery ASAP after his consult.

## 2011-03-22 ENCOUNTER — Telehealth: Payer: Self-pay | Admitting: Oncology

## 2011-03-22 ENCOUNTER — Encounter (INDEPENDENT_AMBULATORY_CARE_PROVIDER_SITE_OTHER): Payer: Self-pay | Admitting: General Surgery

## 2011-03-22 ENCOUNTER — Ambulatory Visit (INDEPENDENT_AMBULATORY_CARE_PROVIDER_SITE_OTHER): Payer: Medicare Other | Admitting: General Surgery

## 2011-03-22 VITALS — BP 130/78 | HR 72 | Temp 98.2°F | Resp 18 | Ht 74.0 in | Wt 173.4 lb

## 2011-03-22 DIAGNOSIS — C182 Malignant neoplasm of ascending colon: Secondary | ICD-10-CM

## 2011-03-22 NOTE — Telephone Encounter (Signed)
pt called and r/s appt on 02/21 to 02/18

## 2011-03-22 NOTE — Progress Notes (Signed)
Patient ID: [name], male   DOB: [birth year], 71 y.o.   MRN: [id number]  Chief Complaint  Patient presents with  . Colon Cancer    HPI [name] is a 71 y.o. male.   HPI  He is referred by Dr. 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 He was noted to be significantly anemic and underwent a workup for that. Colonoscopy demonstrated a polyp near the splenic flexure that was benign. There is a polyp also near the ileocecal valve region this was biopsied and was positive for adenocarcinoma. His aspirin was stopped and he was started on iron and anemia improved. CT scan has not demonstrated any definitive evidence consistent with metastatic disease. He's been referred here to discuss further treatment. There is a strong family history of colon cancer. His brother had colon cancer at the age of 7 and recurrence of the age 30. His nephew had colon cancer. His sister had gastric cancer. No genetic workup has been done on his family.  Past Medical History  Diagnosis Date  . Diverticulosis     Sigmoid colon  . Personal history of colonic polyps 09/17/2009    Tubular adenoma  . History of hemorrhoids   . Atrial fibrillation 2006    Dr Donnie Aho  . Hyperlipidemia   . Anemia   . Arthritis   . Cancer     Past Surgical History  Procedure Date  . Inguinal hernia repair   . Cad 2004    Dr Donnie Aho    Family History  Problem Relation Age of Onset  . Diabetes Father   . Prostate cancer Father   . Heart attack Father 38  . Heart failure Mother     Congestive heart failure  . Colon cancer Brother   . Stomach cancer Sister   . Coronary artery disease Brother     CABG    Social History History  Substance Use Topics  . Smoking status: Former Smoker -- 7 years    Quit date: 03/21/1961  . Smokeless tobacco: Never Used  . Alcohol Use: No    No Known Allergies  Current Outpatient Prescriptions  Medication Sig Dispense Refill  .  amphetamine-dextroamphetamine (ADDERALL) 10 MG tablet       . ferrous sulfate 325 (65 FE) MG tablet Take 325 mg by mouth daily with breakfast. 2 tabs daily      . fish oil-omega-3 fatty acids 1000 MG capsule Take 1 g by mouth daily.        . Garlic (ODORLESS GARLIC) 300 MG CAPS Take by mouth.        . niacin 500 MG tablet Take 500 mg by mouth daily with breakfast.        . omeprazole (PRILOSEC) 10 MG capsule Take 1 capsule (10 mg total) by mouth 2 (two) times daily.  180 capsule  3  . saw palmetto 160 MG capsule Take 160 mg by mouth daily.        . sildenafil (VIAGRA) 100 MG tablet Take 1 tablet (100 mg total) by mouth daily as needed.  6 tablet  11  . simvastatin (ZOCOR) 20 MG tablet Take 1 tablet (20 mg total) by mouth at bedtime.  100 tablet  3  . thiamine (VITAMIN B-1) 100 MG tablet Take 100 mg by mouth daily.          Review of Systems Review of Systems  Constitutional: Negative for fever, chills and unexpected weight  change.  HENT: Positive for hearing loss.   Respiratory:       Sleep apnea.  Cardiovascular: Negative.        Negative stress test within past year.  Gastrointestinal:       Diarrhea   Genitourinary: Positive for hematuria.  Musculoskeletal: Positive for arthralgias.  Neurological: Negative for seizures.  Hematological: Does not bruise/bleed easily.    Blood pressure 130/78, pulse 72, temperature 98.2 F (36.8 C), temperature source Temporal, resp. rate 18, height 6\' 2"  (1.88 m), weight 173 lb 6 oz (78.642 kg).  Physical Exam Physical Exam  Data Reviewed Colonoscopy, CT scan.  Assessment    Newly diagnosed right colon cancer in a patient with a strong family history of gastrointestinal cancers. 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  He has an appointment next week with Dr. Truett Perna of medical oncology.    Plan    We discussed laparoscopic assisted right colectomy. He will also need genetics but this can be obtained  from the tissue. He is interested in proceeding with a right colectomy.  I have explained the procedure and risks of colon resection.  Risks include but are not limited to bleeding, infection, wound problems, anesthesia, anastomotic leak, need for colostomy, injury to intraabominal organs (such as intestine, spleen, kidney, bladder, ureter, etc.), ileus, irregular bowel habits.  He seems to understand and agrees to proceed.       Dieter Hane J 03/22/2011, 2:52 PM

## 2011-03-22 NOTE — Patient Instructions (Addendum)
We will schedule your surgery today. [facility]  ONE-DAY (1) PRE-OP HOME COLON PREP INSTRUCTIONS: ** MIRALAX / GATORADE PREP **  Fill the two prescriptions at a pharmacy of your choice.  You must follow the instructions below carefully.  If you have questions or problems, please call and speak to someone in the clinic department at our office:   [phone].  MIRALAX - GATORADE -- DULCOLAX TABS:   Fill the prescriptions for MIRALAX  (255 gm bottle)    In addition, purchase four (4) DULCOLAX TABLETS (no prescription required), and one 64 oz GATORADE.  (Do NOT purchase red Gatorade; any other flavor is acceptable).  ANITIBIOTICS:   There will be 2 different antibiotics.     Take both prescriptions THE AFTERNOON BEFORE your surgery, at the times written on the bottles.  INSTRUCTIONS: 1. Five days prior to your procedure do not eat nuts, popcorn, or fruit with seeds.  Stop all fiber supplements such as Metamucil, Citrucel, etc.  2. The day before your procedure: o 6:00am:  take (4) Dulcolax tablets.  You should remain on clear liquids for the entire day.   CLEAR LIQUIDS: clear bouillon, broth, jello (NOT RED), black coffee, tea, soda, etc o 10:00am:  add the bottle of MiraLax to the 64-oz bottle of Gatorade, and dissolve.  Begin drinking the Gatorade mixture until gone (8 oz every 15-30 minutes).  Continue clear liquids until midnight (or bedtime). o Take the antibiotics at the times instructed on the bottles.  3. The day of your procedure:   Do not eat or drink ANYTHING after midnight before your surgery.     If you take Heart or Blood Pressure medicine, ask the pre-op nurses about these during your preop appointment.   Further pre-operative instructions will be given to you from the hospital.   Expect to be contacted 5-7 days before your surgery.

## 2011-03-23 ENCOUNTER — Telehealth: Payer: Self-pay | Admitting: Oncology

## 2011-03-23 ENCOUNTER — Telehealth: Payer: Self-pay | Admitting: *Deleted

## 2011-03-23 NOTE — Telephone Encounter (Signed)
called pt and informed him that Dr. Truett Perna wants to see him after his surgery in feb.  r/s appt to 03/11

## 2011-03-23 NOTE — Telephone Encounter (Signed)
Spoke with Bernie at [facility] to confirm 04/08/11 was the earliest surgical date available for this patient.  She will check with Dr. Abbey Chatters and call if a sooner date becomes available. Spoke with Dr. Truett Perna who is okay with current surgery date.  Dr. Truett Perna requested to see patient 2 weeks after surgery date.  Oncology schedulers will call with this appointment. This RN also spoke with the patient to introduce self and assess if there were questions or concerns.  The patient confirmed that Dr. Abbey Chatters is the surgeon he prefers and that he understands he has a busy schedule.   Explained to patient that Dr. Truett Perna would see him 2 weeks after surgery and explained the reasons why.  Patient had no further questions and verbalized comprehension.  Patient was given navigator phone number to call and was reassured his case is being followed.

## 2011-03-29 ENCOUNTER — Ambulatory Visit: Payer: Medicare Other

## 2011-03-29 ENCOUNTER — Ambulatory Visit: Payer: Medicare Other | Admitting: Oncology

## 2011-03-29 ENCOUNTER — Ambulatory Visit (INDEPENDENT_AMBULATORY_CARE_PROVIDER_SITE_OTHER): Payer: Self-pay | Admitting: General Surgery

## 2011-03-29 ENCOUNTER — Other Ambulatory Visit: Payer: Medicare Other | Admitting: Lab

## 2011-03-30 ENCOUNTER — Encounter (HOSPITAL_COMMUNITY): Payer: Self-pay | Admitting: Pharmacy Technician

## 2011-03-31 ENCOUNTER — Ambulatory Visit (INDEPENDENT_AMBULATORY_CARE_PROVIDER_SITE_OTHER): Payer: Medicare Other | Admitting: General Surgery

## 2011-03-31 ENCOUNTER — Encounter (HOSPITAL_COMMUNITY)
Admission: RE | Admit: 2011-03-31 | Discharge: 2011-03-31 | Disposition: A | Discharge status: Home / Self Care | Payer: Medicare Other | Source: Ambulatory Visit | Attending: General Surgery | Admitting: General Surgery

## 2011-03-31 ENCOUNTER — Ambulatory Visit (HOSPITAL_COMMUNITY)
Admission: RE | Admit: 2011-03-31 | Discharge: 2011-03-31 | Disposition: A | Discharge status: Home / Self Care | Payer: Medicare Other | Source: Ambulatory Visit | Attending: General Surgery | Admitting: General Surgery

## 2011-03-31 ENCOUNTER — Encounter (HOSPITAL_COMMUNITY): Payer: Self-pay

## 2011-03-31 DIAGNOSIS — K219 Gastro-esophageal reflux disease without esophagitis: Secondary | ICD-10-CM | POA: Insufficient documentation

## 2011-03-31 DIAGNOSIS — I4891 Unspecified atrial fibrillation: Secondary | ICD-10-CM | POA: Insufficient documentation

## 2011-03-31 DIAGNOSIS — Z01818 Encounter for other preprocedural examination: Secondary | ICD-10-CM | POA: Insufficient documentation

## 2011-03-31 DIAGNOSIS — Z01812 Encounter for preprocedural laboratory examination: Secondary | ICD-10-CM | POA: Insufficient documentation

## 2011-03-31 DIAGNOSIS — G4730 Sleep apnea, unspecified: Secondary | ICD-10-CM | POA: Insufficient documentation

## 2011-03-31 DIAGNOSIS — C182 Malignant neoplasm of ascending colon: Secondary | ICD-10-CM | POA: Insufficient documentation

## 2011-03-31 HISTORY — DX: Sleep apnea, unspecified: G47.30

## 2011-03-31 HISTORY — DX: Gastro-esophageal reflux disease without esophagitis: K21.9

## 2011-03-31 LAB — CBC
Hemoglobin: 13.4 g/dL (ref 13.0–17.0)
MCH: 27 pg (ref 26.0–34.0)
MCHC: 34.2 g/dL (ref 30.0–36.0)
MCV: 78.9 fL (ref 78.0–100.0)
Platelets: 174 10*3/uL (ref 150–400)

## 2011-03-31 LAB — COMPREHENSIVE METABOLIC PANEL
AST: 17 U/L (ref 0–37)
BUN: 13 mg/dL (ref 6–23)
CO2: 29 mEq/L (ref 19–32)
Calcium: 9.5 mg/dL (ref 8.4–10.5)
Creatinine, Ser: 1.04 mg/dL (ref 0.50–1.35)
GFR calc non Af Amer: 71 mL/min — ABNORMAL LOW (ref >90)

## 2011-03-31 LAB — DIFFERENTIAL
Basophils Absolute: 0 10*3/uL (ref 0.0–0.1)
Basophils Relative: 1 % (ref 0–1)
Lymphocytes Relative: 29 % (ref 12–46)
Monocytes Absolute: 0.4 10*3/uL (ref 0.1–1.0)
Neutro Abs: 2.4 10*3/uL (ref 1.7–7.7)
Neutrophils Relative %: 59 % (ref 43–77)

## 2011-03-31 LAB — CEA: CEA: 0.8 ng/mL (ref 0.0–5.0)

## 2011-03-31 LAB — PROTIME-INR
INR: 1.04 (ref 0.00–1.49)
Prothrombin Time: 13.8 seconds (ref 11.6–15.2)

## 2011-03-31 NOTE — Pre-Procedure Instructions (Signed)
03-31-11 EKG/Stress reports requested from Dr. Donnie Aho. -reports with chart. Re-requested resting EKG from Dr. York Spaniel office. CXR done today.

## 2011-03-31 NOTE — Patient Instructions (Signed)
20 [name]  03/31/2011   Your procedure is scheduled on: 04-08-11  Report to [facility] at    1200 noon  Call this number if you have problems the morning of surgery: [phone]   Remember:   Do not eat food:After Midnight.  May have clear liquids:up to 6 Hours before arrival. Nothing after : 0800am  Clear liquids include soda, tea, black coffee, apple or grape juice, broth.  Take these medicines the morning of surgery with A SIP OF WATER: Omeprazole, Bring cpap mask . Follow bowel prep instructions per office.   Do not wear jewelry, make-up or nail polish.  Do not wear lotions, powders, or perfumes. You may wear deodorant.  Do not shave 48 hours prior to surgery.(face and neck only.)  Do not bring valuables to the hospital.  Contacts, dentures or bridgework may not be worn into surgery.  Leave suitcase in the car. After surgery it may be brought to your room.  For patients admitted to the hospital, checkout time is 11:00 AM the day of discharge.   Patients discharged the day of surgery will not be allowed to drive home.  Name and phone number of your driver: Spouse   Special Instructions: CHG Shower Use Special Wash: 1/2 bottle night before surgery and 1/2 bottle morning of surgery.(avoid face and genitals)   Please read over the following fact sheets that you were given: MRSA Information, Blood transfusion fact sheet.

## 2011-04-01 ENCOUNTER — Other Ambulatory Visit: Payer: Medicare Other | Admitting: Lab

## 2011-04-01 ENCOUNTER — Ambulatory Visit: Payer: Medicare Other | Admitting: Oncology

## 2011-04-01 ENCOUNTER — Ambulatory Visit: Payer: Medicare Other

## 2011-04-08 ENCOUNTER — Encounter (HOSPITAL_COMMUNITY)
Admission: RE | Disposition: A | Discharge status: Home / Self Care | Payer: Self-pay | Source: Ambulatory Visit | Attending: General Surgery

## 2011-04-08 ENCOUNTER — Ambulatory Visit (HOSPITAL_COMMUNITY): Payer: Medicare Other | Admitting: *Deleted

## 2011-04-08 ENCOUNTER — Encounter (HOSPITAL_COMMUNITY): Payer: Self-pay | Admitting: *Deleted

## 2011-04-08 ENCOUNTER — Inpatient Hospital Stay (HOSPITAL_COMMUNITY)
Admission: RE | Admit: 2011-04-08 | Discharge: 2011-04-12 | DRG: 330 | Disposition: A | Discharge status: Home / Self Care | Payer: Medicare Other | Source: Ambulatory Visit | Attending: General Surgery | Admitting: General Surgery

## 2011-04-08 ENCOUNTER — Encounter (HOSPITAL_COMMUNITY): Payer: Self-pay

## 2011-04-08 ENCOUNTER — Other Ambulatory Visit: Payer: Self-pay

## 2011-04-08 DIAGNOSIS — N200 Calculus of kidney: Secondary | ICD-10-CM | POA: Diagnosis present

## 2011-04-08 DIAGNOSIS — Y838 Other surgical procedures as the cause of abnormal reaction of the patient, or of later complication, without mention of misadventure at the time of the procedure: Secondary | ICD-10-CM | POA: Diagnosis not present

## 2011-04-08 DIAGNOSIS — C182 Malignant neoplasm of ascending colon: Principal | ICD-10-CM | POA: Diagnosis present

## 2011-04-08 DIAGNOSIS — K560 Paralytic ileus: Secondary | ICD-10-CM | POA: Diagnosis not present

## 2011-04-08 DIAGNOSIS — R7309 Other abnormal glucose: Secondary | ICD-10-CM | POA: Diagnosis present

## 2011-04-08 DIAGNOSIS — I498 Other specified cardiac arrhythmias: Secondary | ICD-10-CM | POA: Diagnosis not present

## 2011-04-08 DIAGNOSIS — I2510 Atherosclerotic heart disease of native coronary artery without angina pectoris: Secondary | ICD-10-CM | POA: Diagnosis present

## 2011-04-08 DIAGNOSIS — E785 Hyperlipidemia, unspecified: Secondary | ICD-10-CM | POA: Diagnosis present

## 2011-04-08 DIAGNOSIS — D62 Acute posthemorrhagic anemia: Secondary | ICD-10-CM | POA: Diagnosis not present

## 2011-04-08 DIAGNOSIS — K7689 Other specified diseases of liver: Secondary | ICD-10-CM | POA: Diagnosis present

## 2011-04-08 DIAGNOSIS — R000 Tachycardia, unspecified: Secondary | ICD-10-CM

## 2011-04-08 DIAGNOSIS — IMO0002 Reserved for concepts with insufficient information to code with codable children: Secondary | ICD-10-CM | POA: Diagnosis not present

## 2011-04-08 DIAGNOSIS — G4733 Obstructive sleep apnea (adult) (pediatric): Secondary | ICD-10-CM | POA: Diagnosis present

## 2011-04-08 DIAGNOSIS — C189 Malignant neoplasm of colon, unspecified: Secondary | ICD-10-CM

## 2011-04-08 DIAGNOSIS — I4949 Other premature depolarization: Secondary | ICD-10-CM | POA: Diagnosis not present

## 2011-04-08 HISTORY — PX: LAPAROSCOPIC PARTIAL COLECTOMY: SHX5907

## 2011-04-08 LAB — CBC
Platelets: 229 10*3/uL (ref 150–400)
RBC: 4.09 MIL/uL — ABNORMAL LOW (ref 4.22–5.81)
RDW: 16.8 % — ABNORMAL HIGH (ref 11.5–15.5)
WBC: 13.7 10*3/uL — ABNORMAL HIGH (ref 4.0–10.5)

## 2011-04-08 LAB — TYPE AND SCREEN
ABO/RH(D): B POS
Antibody Screen: NEGATIVE

## 2011-04-08 SURGERY — LAPAROSCOPIC PARTIAL COLECTOMY
Anesthesia: General

## 2011-04-08 MED ORDER — MORPHINE SULFATE (PF) 1 MG/ML IV SOLN
INTRAVENOUS | Status: DC
  Administered 2011-04-08: 21:00:00 via INTRAVENOUS
  Administered 2011-04-08: 19 mg via INTRAVENOUS
  Administered 2011-04-08: 17:00:00 via INTRAVENOUS
  Administered 2011-04-09: 4 mg via INTRAVENOUS
  Administered 2011-04-09: 9 mg via INTRAVENOUS
  Administered 2011-04-09: 11.97 mg via INTRAVENOUS
  Administered 2011-04-09: 7 mg via INTRAVENOUS
  Administered 2011-04-09: 7.49 mg via INTRAVENOUS
  Administered 2011-04-10: via INTRAVENOUS
  Administered 2011-04-10: 2.98 mg via INTRAVENOUS
  Administered 2011-04-10 (×3): 1 mg via INTRAVENOUS
  Administered 2011-04-10: 3 mg via INTRAVENOUS
  Administered 2011-04-11: 0 mg via INTRAVENOUS
  Filled 2011-04-08 (×3): qty 25

## 2011-04-08 MED ORDER — FENTANYL CITRATE 0.05 MG/ML IJ SOLN
25.0000 ug | INTRAMUSCULAR | Status: DC | PRN
  Administered 2011-04-08: 50 ug via INTRAVENOUS
  Administered 2011-04-08 (×2): 25 ug via INTRAVENOUS

## 2011-04-08 MED ORDER — METOPROLOL TARTRATE 1 MG/ML IV SOLN
5.0000 mg | Freq: Four times a day (QID) | INTRAVENOUS | Status: DC | PRN
  Administered 2011-04-09 (×2): 5 mg via INTRAVENOUS
  Filled 2011-04-08 (×2): qty 5

## 2011-04-08 MED ORDER — GLYCOPYRROLATE 0.2 MG/ML IJ SOLN
INTRAMUSCULAR | Status: DC | PRN
  Administered 2011-04-08: .6 mg via INTRAVENOUS

## 2011-04-08 MED ORDER — ROCURONIUM BROMIDE 100 MG/10ML IV SOLN
INTRAVENOUS | Status: DC | PRN
  Administered 2011-04-08 (×2): 5 mg via INTRAVENOUS
  Administered 2011-04-08: 50 mg via INTRAVENOUS
  Administered 2011-04-08: 20 mg via INTRAVENOUS

## 2011-04-08 MED ORDER — PROPOFOL 10 MG/ML IV EMUL
INTRAVENOUS | Status: DC | PRN
  Administered 2011-04-08: 200 mg via INTRAVENOUS

## 2011-04-08 MED ORDER — DEXTROSE 5 % IV SOLN
1.0000 g | INTRAVENOUS | Status: DC | PRN
  Administered 2011-04-08: 1 g via INTRAVENOUS

## 2011-04-08 MED ORDER — LIDOCAINE HCL (CARDIAC) 20 MG/ML IV SOLN
INTRAVENOUS | Status: DC | PRN
  Administered 2011-04-08: 50 mg via INTRAVENOUS

## 2011-04-08 MED ORDER — PHENYLEPHRINE HCL 10 MG/ML IJ SOLN
INTRAMUSCULAR | Status: DC | PRN
  Administered 2011-04-08: 120 ug via INTRAVENOUS
  Administered 2011-04-08: 80 ug via INTRAVENOUS
  Administered 2011-04-08: 40 ug via INTRAVENOUS
  Administered 2011-04-08: 80 ug via INTRAVENOUS

## 2011-04-08 MED ORDER — SODIUM CHLORIDE 0.9 % IV BOLUS (SEPSIS)
500.0000 mL | Freq: Once | INTRAVENOUS | Status: AC
  Administered 2011-04-08: 500 mL via INTRAVENOUS

## 2011-04-08 MED ORDER — CEFOXITIN SODIUM-DEXTROSE 1-4 GM-% IV SOLR (PREMIX)
INTRAVENOUS | Status: AC
  Filled 2011-04-08: qty 50

## 2011-04-08 MED ORDER — LACTATED RINGERS IV SOLN
INTRAVENOUS | Status: DC | PRN
  Administered 2011-04-08 (×2): via INTRAVENOUS

## 2011-04-08 MED ORDER — FENTANYL CITRATE 0.05 MG/ML IJ SOLN
INTRAMUSCULAR | Status: DC | PRN
  Administered 2011-04-08 (×5): 50 ug via INTRAVENOUS

## 2011-04-08 MED ORDER — PANTOPRAZOLE SODIUM 40 MG PO TBEC
40.0000 mg | DELAYED_RELEASE_TABLET | Freq: Every day | ORAL | Status: DC
  Administered 2011-04-09 – 2011-04-11 (×3): 40 mg via ORAL
  Filled 2011-04-08 (×4): qty 1

## 2011-04-08 MED ORDER — FENTANYL CITRATE 0.05 MG/ML IJ SOLN
INTRAMUSCULAR | Status: AC
  Filled 2011-04-08: qty 2

## 2011-04-08 MED ORDER — DEXTROSE 5 % IV SOLN
1.0000 g | INTRAVENOUS | Status: DC
  Filled 2011-04-08: qty 1

## 2011-04-08 MED ORDER — LACTATED RINGERS IV SOLN: INTRAVENOUS | Status: DC

## 2011-04-08 MED ORDER — DEXAMETHASONE SODIUM PHOSPHATE 10 MG/ML IJ SOLN
INTRAMUSCULAR | Status: DC | PRN
  Administered 2011-04-08: 10 mg via INTRAVENOUS

## 2011-04-08 MED ORDER — DEXTROSE 5 % IV SOLN
1.0000 g | Freq: Four times a day (QID) | INTRAVENOUS | Status: AC
  Administered 2011-04-08 – 2011-04-09 (×3): 1 g via INTRAVENOUS
  Filled 2011-04-08 (×5): qty 1

## 2011-04-08 MED ORDER — ALVIMOPAN 12 MG PO CAPS
12.0000 mg | ORAL_CAPSULE | Freq: Once | ORAL | Status: AC
  Administered 2011-04-08: 12 mg via ORAL

## 2011-04-08 MED ORDER — MORPHINE SULFATE (PF) 1 MG/ML IV SOLN
INTRAVENOUS | Status: AC
  Filled 2011-04-08: qty 25

## 2011-04-08 MED ORDER — HYDROMORPHONE HCL PF 1 MG/ML IJ SOLN
INTRAMUSCULAR | Status: DC | PRN
  Administered 2011-04-08 (×2): 1 mg via INTRAVENOUS

## 2011-04-08 MED ORDER — ONDANSETRON HCL 4 MG/2ML IJ SOLN
INTRAMUSCULAR | Status: DC | PRN
  Administered 2011-04-08: 2 mg via INTRAVENOUS

## 2011-04-08 MED ORDER — KCL IN DEXTROSE-NACL 20-5-0.9 MEQ/L-%-% IV SOLN
INTRAVENOUS | Status: AC
  Filled 2011-04-08: qty 1000

## 2011-04-08 MED ORDER — KCL IN DEXTROSE-NACL 20-5-0.9 MEQ/L-%-% IV SOLN
INTRAVENOUS | Status: DC
  Administered 2011-04-08 – 2011-04-09 (×2): via INTRAVENOUS
  Filled 2011-04-08 (×3): qty 1000

## 2011-04-08 MED ORDER — HETASTARCH-ELECTROLYTES 6 % IV SOLN
INTRAVENOUS | Status: DC | PRN
  Administered 2011-04-08: 14:00:00 via INTRAVENOUS

## 2011-04-08 MED ORDER — NEOSTIGMINE METHYLSULFATE 1 MG/ML IJ SOLN
INTRAMUSCULAR | Status: DC | PRN
  Administered 2011-04-08: 5 mg via INTRAVENOUS

## 2011-04-08 SURGICAL SUPPLY — 70 items
APPLIER CLIP 5 13 M/L LIGAMAX5 (MISCELLANEOUS)
APPLIER CLIP ROT 10 11.4 M/L (STAPLE)
BLADE EXTENDED COATED 6.5IN (ELECTRODE) ×2 IMPLANT
BLADE HEX COATED 2.75 (ELECTRODE) ×2 IMPLANT
BLADE SURG SZ10 CARB STEEL (BLADE) ×2 IMPLANT
CABLE HIGH FREQUENCY MONO STRZ (ELECTRODE) ×2 IMPLANT
CANISTER SUCTION 2500CC (MISCELLANEOUS) ×2 IMPLANT
CANNULA ENDOPATH XCEL 11M (ENDOMECHANICALS) IMPLANT
CELLS DAT CNTRL 66122 CELL SVR (MISCELLANEOUS) IMPLANT
CLIP APPLIE 5 13 M/L LIGAMAX5 (MISCELLANEOUS) IMPLANT
CLIP APPLIE ROT 10 11.4 M/L (STAPLE) IMPLANT
CLOTH BEACON ORANGE TIMEOUT ST (SAFETY) ×2 IMPLANT
COVER MAYO STAND STRL (DRAPES) ×2 IMPLANT
DECANTER SPIKE VIAL GLASS SM (MISCELLANEOUS) ×2 IMPLANT
DISSECTOR BLUNT TIP ENDO 5MM (MISCELLANEOUS) IMPLANT
DRAPE LAPAROSCOPIC ABDOMINAL (DRAPES) ×2 IMPLANT
DRAPE LG THREE QUARTER DISP (DRAPES) IMPLANT
DRAPE UTILITY XL STRL (DRAPES) ×2 IMPLANT
DRAPE WARM FLUID 44X44 (DRAPE) ×2 IMPLANT
ELECT REM PT RETURN 9FT ADLT (ELECTROSURGICAL) ×2
ELECTRODE REM PT RTRN 9FT ADLT (ELECTROSURGICAL) ×1 IMPLANT
FILTER SMOKE EVAC LAPAROSHD (FILTER) IMPLANT
GLOVE BIOGEL PI IND STRL 7.0 (GLOVE) ×1 IMPLANT
GLOVE BIOGEL PI INDICATOR 7.0 (GLOVE) ×1
GLOVE ECLIPSE 8.0 STRL XLNG CF (GLOVE) ×8 IMPLANT
GLOVE INDICATOR 8.0 STRL GRN (GLOVE) ×4 IMPLANT
GOWN STRL NON-REIN LRG LVL3 (GOWN DISPOSABLE) ×2 IMPLANT
GOWN STRL REIN XL XLG (GOWN DISPOSABLE) ×4 IMPLANT
KIT BASIN OR (CUSTOM PROCEDURE TRAY) ×2 IMPLANT
LEGGING LITHOTOMY PAIR STRL (DRAPES) IMPLANT
LIGASURE IMPACT 36 18CM CVD LR (INSTRUMENTS) ×2 IMPLANT
NS IRRIG 1000ML POUR BTL (IV SOLUTION) ×4 IMPLANT
PENCIL BUTTON HOLSTER BLD 10FT (ELECTRODE) ×2 IMPLANT
RELOAD PROXIMATE 75MM BLUE (ENDOMECHANICALS) ×4 IMPLANT
RTRCTR WOUND ALEXIS 18CM MED (MISCELLANEOUS)
SCALPEL HARMONIC ACE (MISCELLANEOUS) IMPLANT
SCISSORS LAP 5X35 DISP (ENDOMECHANICALS) ×2 IMPLANT
SET IRRIG TUBING LAPAROSCOPIC (IRRIGATION / IRRIGATOR) IMPLANT
SLEEVE XCEL OPT CAN 5 100 (ENDOMECHANICALS) IMPLANT
SOLUTION ANTI FOG 6CC (MISCELLANEOUS) ×2 IMPLANT
SPONGE GAUZE 4X4 12PLY (GAUZE/BANDAGES/DRESSINGS) ×2 IMPLANT
SPONGE LAP 18X18 X RAY DECT (DISPOSABLE) ×4 IMPLANT
STAPLER GUN LINEAR PROX 60 (STAPLE) ×2 IMPLANT
STAPLER PROXIMATE 75MM BLUE (STAPLE) ×2 IMPLANT
STAPLER VISISTAT 35W (STAPLE) ×2 IMPLANT
SUCTION POOLE TIP (SUCTIONS) ×2 IMPLANT
SUT PDS AB 1 CTX 36 (SUTURE) ×4 IMPLANT
SUT PDS AB 1 TP1 96 (SUTURE) IMPLANT
SUT PROLENE 2 0 SH DA (SUTURE) IMPLANT
SUT SILK 2 0 (SUTURE) ×1
SUT SILK 2 0 SH CR/8 (SUTURE) ×2 IMPLANT
SUT SILK 2-0 18XBRD TIE 12 (SUTURE) ×1 IMPLANT
SUT SILK 3 0 (SUTURE) ×1
SUT SILK 3 0 SH CR/8 (SUTURE) ×4 IMPLANT
SUT SILK 3-0 18XBRD TIE 12 (SUTURE) ×1 IMPLANT
SUT VIC AB 3-0 SH 18 (SUTURE) ×2 IMPLANT
SUT VICRYL 2 0 18  UND BR (SUTURE) ×1
SUT VICRYL 2 0 18 UND BR (SUTURE) ×1 IMPLANT
SYS LAPSCP GELPORT 120MM (MISCELLANEOUS) ×2
SYSTEM LAPSCP GELPORT 120MM (MISCELLANEOUS) ×1 IMPLANT
TOWEL OR 17X26 10 PK STRL BLUE (TOWEL DISPOSABLE) ×2 IMPLANT
TRAY FOLEY CATH 14FRSI W/METER (CATHETERS) ×2 IMPLANT
TRAY LAP CHOLE (CUSTOM PROCEDURE TRAY) ×2 IMPLANT
TROCAR BLADELESS OPT 5 100 (ENDOMECHANICALS) IMPLANT
TROCAR BLADELESS OPT 5 75 (ENDOMECHANICALS) ×4 IMPLANT
TROCAR XCEL BLUNT TIP 100MML (ENDOMECHANICALS) IMPLANT
TROCAR XCEL NON-BLD 11X100MML (ENDOMECHANICALS) IMPLANT
TUBING INSUFFLATION 10FT LAP (TUBING) ×2 IMPLANT
YANKAUER SUCT BULB TIP 10FT TU (MISCELLANEOUS) ×2 IMPLANT
YANKAUER SUCT BULB TIP NO VENT (SUCTIONS) ×2 IMPLANT

## 2011-04-08 NOTE — Op Note (Signed)
Operative Note  [name] male 71 y.o. 04/08/2011  PREOPERATIVE DX:  Cecal cancer  POSTOPERATIVE DX:  Same + Hepatic cysts    PROCEDURE:  Laparoscopic hand-assisted right colectomy and resection of terminal ileum         Surgeon: Adolph Pollack   Assistants: Karie Soda  Anesthesia: General endotracheal anesthesia  Indications:  This is a 71 year old male noted to have anemia. He underwent a colonoscopy which demonstrated a lesion at the ileocecal valve region was biopsied and is positive for adenocarcinoma. CT scan did not demonstrate any definitive evidence of metastasis. Some liver lesions were noted and there felt to be consistent with possible cysts. He now presents for the above procedure.    Procedure Detail:  He was brought to the operating room placed supine on the operating table and a general anesthetic was administered.  A Foley catheter was inserted. The hair on the abdominal wall was clipped. The abdominal wall was sterilely prepped and draped.  He was placed in slight reversed Trendelenburg position. A 5 mm incision was made in the left subcostal region. Using a 5 mm Optiview trocar and laparoscope access to the peritoneal cavity was gained and a pneumoperitoneum was created. Inspection of the area underneath the trocar demonstrated no evidence of organ injury or bleeding.  A 5 mm trocar was then placed in the lower midline region. The right colon was grasped and the tumor could be seen near the cecal area. I removed this trocar and a limited lower midline incision and placed a GelPort at this site. A 5 mm incision was then made in the left upper quadrant and one in the right lower quadrant. Using hand assistance, the lateral attachments of the right colon to the abdominal wall were divided sharply and using electrocautery. The hepatic flexure was mobilized as well as the proximal half of the transverse colon using sharp and blunt dissection. The terminal ileum  was also mobilized. I was able to pull the transverse colon right colon and terminal ileum directly underneath the GelPort. The right ureter was identified and preserved.  I then externalized distal ileum right colon and proximal half of the transverse colon. Omentum was dissected free from the proximal half of the transverse colon. The distal ileum was divided 10 cm proximal to the ileocecal valve with the tumor could be felt. The transverse colon was divided at the proximal two thirds distal two thirds area. The mesentery was resected in a wedge fashion using the LigaSure and ligating vessels with silk ties.  The right colon and terminal ileum were then sent to pathology and microsatellite instability testing was requested.  A side to side stapled anastomosis was then performed between the distal ileum and the transverse colon. No bleeding from the staple lines was noted. The common defect was closed with a linear noncutting stapler.  The anastomosis was patent, viable, under no tension, and showed no evidence of leak. Crotch sutures of silk were placed anteriorly.  The liver was palpated and multiple irregular lesions were noted. The GelPort was replaced and laparoscopic inspection of the liver demonstrated multiple cysts. Abdominal carriage irrigated out laparoscopically in the GelPort was removed air was irrigated out to the extraction site incision a lower midline. Hemostasis was adequate. Irrigation fluid was evacuated as much as possible. 4 quadrant inspection was performed and there is no evidence of bleeding or organ injury.  The remaining trocars were removed.  The limited lower midline incision fascia was closed with running #  1 PDS suture. Needle, sponge, and instrument counts reportedly correct.  A lower midline skin was irrigated and then closed with staples. The trocar sites in the left upper quadrant and one in the right lower quadrant were closed with 4-0 Monocryl subcuticular stitches  followed by Steri-Strips.  Sterile dressings were applied to all the incisions.  He tolerated the procedure well without any apparent complications and was taken to the recovery room in satisfactory condition.    Estimated Blood Loss:  200 mL         Drains: none          Blood Given: none          Specimens:  Right colon and terminal ileum        Complications:  * No complications entered in OR log *         Disposition: PACU - hemodynamically stable.         Condition: stable

## 2011-04-08 NOTE — Preoperative (Signed)
Beta Blockers   Reason not to administer Beta Blockers:Not Applicable 

## 2011-04-08 NOTE — H&P (View-Only) (Signed)
Patient ID: [name], male   DOB: 10/06/1940, 70 y.o.   MRN: [id number]  Chief Complaint  Patient presents with  . Colon Cancer    HPI [name] is a 70 y.o. male.   HPI  He is referred by Dr. 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 He was noted to be significantly anemic and underwent a workup for that. Colonoscopy demonstrated a polyp near the splenic flexure that was benign. There is a polyp also near the ileocecal valve region this was biopsied and was positive for adenocarcinoma. His aspirin was stopped and he was started on iron and anemia improved. CT scan has not demonstrated any definitive evidence consistent with metastatic disease. He's been referred here to discuss further treatment. There is a strong family history of colon cancer. His brother had colon cancer at the age of 50 and recurrence of the age 70. His nephew had colon cancer. His sister had gastric cancer. No genetic workup has been done on his family.  Past Medical History  Diagnosis Date  . Diverticulosis     Sigmoid colon  . Personal history of colonic polyps 09/17/2009    Tubular adenoma  . History of hemorrhoids   . Atrial fibrillation 2006    Dr Tilley  . Hyperlipidemia   . Anemia   . Arthritis   . Cancer     Past Surgical History  Procedure Date  . Inguinal hernia repair   . Cad 2004    Dr Tilley    Family History  Problem Relation Age of Onset  . Diabetes Father   . Prostate cancer Father   . Heart attack Father 86  . Heart failure Mother     Congestive heart failure  . Colon cancer Brother   . Stomach cancer Sister   . Coronary artery disease Brother     CABG    Social History History  Substance Use Topics  . Smoking status: Former Smoker -- 7 years    Quit date: 03/21/1961  . Smokeless tobacco: Never Used  . Alcohol Use: No    No Known Allergies  Current Outpatient Prescriptions  Medication Sig Dispense Refill  .  amphetamine-dextroamphetamine (ADDERALL) 10 MG tablet       . ferrous sulfate 325 (65 FE) MG tablet Take 325 mg by mouth daily with breakfast. 2 tabs daily      . fish oil-omega-3 fatty acids 1000 MG capsule Take 1 g by mouth daily.        . Garlic (ODORLESS GARLIC) 300 MG CAPS Take by mouth.        . niacin 500 MG tablet Take 500 mg by mouth daily with breakfast.        . omeprazole (PRILOSEC) 10 MG capsule Take 1 capsule (10 mg total) by mouth 2 (two) times daily.  180 capsule  3  . saw palmetto 160 MG capsule Take 160 mg by mouth daily.        . sildenafil (VIAGRA) 100 MG tablet Take 1 tablet (100 mg total) by mouth daily as needed.  6 tablet  11  . simvastatin (ZOCOR) 20 MG tablet Take 1 tablet (20 mg total) by mouth at bedtime.  100 tablet  3  . thiamine (VITAMIN B-1) 100 MG tablet Take 100 mg by mouth daily.          Review of Systems Review of Systems  Constitutional: Negative for fever, chills and unexpected weight   change.  HENT: Positive for hearing loss.   Respiratory:       Sleep apnea.  Cardiovascular: Negative.        Negative stress test within past year.  Gastrointestinal:       Diarrhea   Genitourinary: Positive for hematuria.  Musculoskeletal: Positive for arthralgias.  Neurological: Negative for seizures.  Hematological: Does not bruise/bleed easily.    Blood pressure 130/78, pulse 72, temperature 98.2 F (36.8 C), temperature source Temporal, resp. rate 18, height 6' 2" (1.88 m), weight 173 lb 6 oz (78.642 kg).  Physical Exam Physical Exam  Data Reviewed Colonoscopy, CT scan.  Assessment    Newly diagnosed right colon cancer in a patient with a strong family history of gastrointestinal cancers. 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  He has an appointment next week with Dr. Sherrill of medical oncology.    Plan    We discussed laparoscopic assisted right colectomy. He will also need genetics but this can be obtained  from the tissue. He is interested in proceeding with a right colectomy.  I have explained the procedure and risks of colon resection.  Risks include but are not limited to bleeding, infection, wound problems, anesthesia, anastomotic leak, need for colostomy, injury to intraabominal organs (such as intestine, spleen, kidney, bladder, ureter, etc.), ileus, irregular bowel habits.  He seems to understand and agrees to proceed.       Reginold Beale J 03/22/2011, 2:52 PM    

## 2011-04-08 NOTE — Interval H&P Note (Signed)
History and Physical Interval Note:  04/08/2011 1:29 PM  [name]  has presented today for surgery, with the diagnosis of ascending colon cancer   The various methods of treatment have been discussed with the patient and family. After consideration of risks, benefits and other options for treatment, the patient has consented to  Procedure(s) (LRB): LAPAROSCOPIC PARTIAL COLECTOMY (N/A) as a surgical intervention .  The patients' history has been reviewed, patient examined, no change in status, stable for surgery.  I have reviewed the patients' chart and labs.  Questions were answered to the patient's satisfaction.     [name] Shela Commons

## 2011-04-08 NOTE — Anesthesia Postprocedure Evaluation (Signed)
  Anesthesia Post-op Note  Patient: [name]  Procedure(s) Performed: Procedure(s) (LRB): LAPAROSCOPIC PARTIAL COLECTOMY (N/A)  Patient Location: PACU  Anesthesia Type: General  Level of Consciousness: oriented and sedated  Airway and Oxygen Therapy: Patient Spontanous Breathing and Patient connected to nasal cannula oxygen  Post-op Pain: mild  Post-op Assessment: Post-op Vital signs reviewed, Patient's Cardiovascular Status Stable, Respiratory Function Stable and Patent Airway  Post-op Vital Signs: stable  Complications: No apparent anesthesia complications

## 2011-04-08 NOTE — Progress Notes (Signed)
[name] underwent lap colectomy by Dr. Abbey Chatters earlier today.  He feels well and is without complaint.  His heart rate has been slowly rising since about 1645.  He is now in mid 120s and is asymptomatic.  His EKG shows him to be in sinus tachycardia.  His bp, sats and rr are all within normal limits.  He has adequate uop although a little on the low side.  Will check cbc now and give fluid bolus.  Will keep on telemetry.  I don't think due to lack of symptoms and any other objective findings will investigate further for now.

## 2011-04-08 NOTE — Anesthesia Preprocedure Evaluation (Signed)
Anesthesia Evaluation  Patient identified by MRN, date of birth, ID band Patient awake    Reviewed: Allergy & Precautions, H&P , NPO status , Patient's Chart, lab work & pertinent test results  History of Anesthesia Complications Negative for: history of anesthetic complications  Airway Mallampati: II TM Distance: >3 FB Neck ROM: Full    Dental  (+) Teeth Intact and Dental Advisory Given   Pulmonary sleep apnea and Continuous Positive Airway Pressure Ventilation ,  clear to auscultation  Pulmonary exam normal       Cardiovascular Exercise Tolerance: Good neg cardio ROS + dysrhythmias (History of Afib; resolved spontanously) Atrial Fibrillation Regular Normal    Neuro/Psych PSYCHIATRIC DISORDERS Negative Neurological ROS  Negative Psych ROS   GI/Hepatic negative GI ROS, Neg liver ROS, GERD-  Medicated,  Endo/Other  Negative Endocrine ROS  Renal/GU negative Renal ROS  Genitourinary negative   Musculoskeletal negative musculoskeletal ROS (+)   Abdominal   Peds  Hematology negative hematology ROS (+)   Anesthesia Other Findings   Reproductive/Obstetrics negative OB ROS                           Anesthesia Physical Anesthesia Plan  ASA: II  Anesthesia Plan: General   Post-op Pain Management:    Induction: Intravenous  Airway Management Planned: Oral ETT  Additional Equipment:   Intra-op Plan:   Post-operative Plan: Extubation in OR  Informed Consent: I have reviewed the patients History and Physical, chart, labs and discussed the procedure including the risks, benefits and alternatives for the proposed anesthesia with the patient or authorized representative who has indicated his/her understanding and acceptance.   Dental advisory given  Plan Discussed with: CRNA  Anesthesia Plan Comments:         Anesthesia Quick Evaluation

## 2011-04-08 NOTE — Progress Notes (Signed)
CPAP in car. Family will get if patient needs

## 2011-04-08 NOTE — Transfer of Care (Signed)
Immediate Anesthesia Transfer of Care Note  Patient: [name]  Procedure(s) Performed: Procedure(s) (LRB): LAPAROSCOPIC PARTIAL COLECTOMY (N/A)  Patient Location: PACU  Anesthesia Type: General  Level of Consciousness: awake and alert   Airway & Oxygen Therapy: Patient Spontanous Breathing and Patient connected to face mask oxygen  Post-op Assessment: Report given to PACU RN and Post -op Vital signs reviewed and stable  Post vital signs: Reviewed and stable  Complications: No apparent anesthesia complications

## 2011-04-08 NOTE — Anesthesia Procedure Notes (Signed)
Procedure Name: Intubation Date/Time: 04/08/2011 1:40 PM Performed by: Randon Goldsmith [name] Pre-anesthesia Checklist: Patient identified, Emergency Drugs available, Suction available and Patient being monitored Patient Re-evaluated:Patient Re-evaluated prior to inductionOxygen Delivery Method: Circle system utilized Preoxygenation: Pre-oxygenation with 100% oxygen Intubation Type: IV induction Ventilation: Mask ventilation without difficulty and Oral airway inserted - appropriate to patient size Laryngoscope Size: Miller and 3 Grade View: Grade II Tube type: Oral Number of attempts: 2 Airway Equipment and Method: Stylet Placement Confirmation: ETT inserted through vocal cords under direct vision,  positive ETCO2,  breath sounds checked- equal and bilateral and CO2 detector Secured at: 23 cm Tube secured with: Tape Dental Injury: Teeth and Oropharynx as per pre-operative assessment

## 2011-04-09 DIAGNOSIS — I2510 Atherosclerotic heart disease of native coronary artery without angina pectoris: Secondary | ICD-10-CM | POA: Diagnosis present

## 2011-04-09 DIAGNOSIS — R7309 Other abnormal glucose: Secondary | ICD-10-CM

## 2011-04-09 DIAGNOSIS — N200 Calculus of kidney: Secondary | ICD-10-CM | POA: Diagnosis present

## 2011-04-09 LAB — BASIC METABOLIC PANEL
BUN: 17 mg/dL (ref 6–23)
CO2: 22 mEq/L (ref 19–32)
Chloride: 105 mEq/L (ref 96–112)
Creatinine, Ser: 1.24 mg/dL (ref 0.50–1.35)

## 2011-04-09 LAB — GLUCOSE, CAPILLARY
Glucose-Capillary: 143 mg/dL — ABNORMAL HIGH (ref 70–99)
Glucose-Capillary: 166 mg/dL — ABNORMAL HIGH (ref 70–99)

## 2011-04-09 LAB — CBC
HCT: 30 % — ABNORMAL LOW (ref 39.0–52.0)
MCHC: 35 g/dL (ref 30.0–36.0)
MCV: 79.2 fL (ref 78.0–100.0)
RDW: 16.9 % — ABNORMAL HIGH (ref 11.5–15.5)
WBC: 10.7 10*3/uL — ABNORMAL HIGH (ref 4.0–10.5)

## 2011-04-09 MED ORDER — ONDANSETRON HCL 4 MG/2ML IJ SOLN: 4.0000 mg | Freq: Four times a day (QID) | INTRAMUSCULAR | Status: DC | PRN

## 2011-04-09 MED ORDER — INSULIN ASPART 100 UNIT/ML ~~LOC~~ SOLN
0.0000 [IU] | SUBCUTANEOUS | Status: DC
  Administered 2011-04-09: 5 [IU] via SUBCUTANEOUS
  Administered 2011-04-09: 3 [IU] via SUBCUTANEOUS
  Administered 2011-04-09: 2 [IU] via SUBCUTANEOUS
  Administered 2011-04-09: 3 [IU] via SUBCUTANEOUS
  Administered 2011-04-10 – 2011-04-11 (×5): 2 [IU] via SUBCUTANEOUS
  Filled 2011-04-09: qty 3

## 2011-04-09 MED ORDER — ONDANSETRON HCL 4 MG PO TABS: 4.0000 mg | ORAL_TABLET | Freq: Four times a day (QID) | ORAL | Status: DC | PRN

## 2011-04-09 MED ORDER — ALVIMOPAN 12 MG PO CAPS
12.0000 mg | ORAL_CAPSULE | Freq: Two times a day (BID) | ORAL | Status: DC
  Administered 2011-04-09 – 2011-04-12 (×7): 12 mg via ORAL
  Filled 2011-04-09 (×9): qty 1

## 2011-04-09 MED ORDER — SODIUM CHLORIDE 0.9 % IJ SOLN: 9.0000 mL | INTRAMUSCULAR | Status: DC | PRN

## 2011-04-09 MED ORDER — SODIUM CHLORIDE 0.9 % IV SOLN
INTRAVENOUS | Status: DC
  Administered 2011-04-09 – 2011-04-12 (×4): via INTRAVENOUS

## 2011-04-09 MED ORDER — DIPHENHYDRAMINE HCL 12.5 MG/5ML PO ELIX: 12.5000 mg | ORAL_SOLUTION | Freq: Four times a day (QID) | ORAL | Status: DC | PRN

## 2011-04-09 MED ORDER — METOPROLOL TARTRATE 25 MG PO TABS
25.0000 mg | ORAL_TABLET | Freq: Three times a day (TID) | ORAL | Status: DC
  Administered 2011-04-09 – 2011-04-12 (×7): 25 mg via ORAL
  Filled 2011-04-09 (×11): qty 1

## 2011-04-09 MED ORDER — HEPARIN SODIUM (PORCINE) 5000 UNIT/ML IJ SOLN
5000.0000 [IU] | Freq: Three times a day (TID) | INTRAMUSCULAR | Status: DC
  Filled 2011-04-09 (×3): qty 1

## 2011-04-09 MED ORDER — DIPHENHYDRAMINE HCL 50 MG/ML IJ SOLN: 12.5000 mg | Freq: Four times a day (QID) | INTRAMUSCULAR | Status: DC | PRN

## 2011-04-09 MED ORDER — NALOXONE HCL 0.4 MG/ML IJ SOLN: 0.4000 mg | INTRAMUSCULAR | Status: DC | PRN

## 2011-04-09 NOTE — Progress Notes (Signed)
Report to [place] rn 4 east

## 2011-04-09 NOTE — Progress Notes (Signed)
1 Day Post-Op  Subjective:  He c/o abdominal soreness. No dyspnea.  Objective: Vital signs in last 24 hours: Temp:  [97.5 F (36.4 C)-100.1 F (37.8 C)] 97.5 F (36.4 C) (03/01 0505) Pulse Rate:  [88-131] 92  (03/01 0505) Resp:  [12-25] 18  (03/01 0505) BP: (96-163)/(50-87) 96/65 mmHg (03/01 0505) SpO2:  [94 %-100 %] 98 % (03/01 0505) Weight:  [170 lb 10.2 oz (77.4 kg)] 170 lb 10.2 oz (77.4 kg) (03/01 0115) Last BM Date: 04/08/11  Intake/Output from previous day: 02/28 0701 - 03/01 0700 In: 4125 [I.V.:3025; IV Piggyback:1100] Out: 900 [Urine:700; Blood:200] Intake/Output this shift:    PE: CV-increased rate Abd-soft, slightly distended, few BS, dressings dry  Lab Results:   Basename 04/09/11 0438 04/08/11 2350  WBC 10.7* 13.7*  HGB 10.5* 11.3*  HCT 30.0* 32.5*  PLT 259 229   BMET  Basename 04/09/11 0438  NA 135  K 4.9  CL 105  CO2 22  GLUCOSE 292*  BUN 17  CREATININE 1.24  CALCIUM 8.2*   PT/INR No results found for this basename: LABPROT:2,INR:2 in the last 72 hours Comprehensive Metabolic Panel:    Component Value Date/Time   NA 135 04/09/2011 0438   K 4.9 04/09/2011 0438   CL 105 04/09/2011 0438   CO2 22 04/09/2011 0438   BUN 17 04/09/2011 0438   CREATININE 1.24 04/09/2011 0438   GLUCOSE 292* 04/09/2011 0438   CALCIUM 8.2* 04/09/2011 0438   AST 17 03/31/2011 0930   ALT 18 03/31/2011 0930   ALKPHOS 79 03/31/2011 0930   BILITOT 0.5 03/31/2011 0930   PROT 7.0 03/31/2011 0930   ALBUMIN 3.9 03/31/2011 0930     Studies/Results: No results found.  Anti-infectives: Anti-infectives     Start     Dose/Rate Route Frequency Ordered Stop   04/08/11 2030   cefOXitin (MEFOXIN) 1 g in dextrose 5 % 50 mL IVPB        1 g 100 mL/hr over 30 Minutes Intravenous Every 6 hours 04/08/11 1947 04/09/11 1429   04/08/11 1230   cefOXitin (MEFOXIN) 1 g in dextrose 5 % 50 mL IVPB  Status:  Discontinued        1 g 100 mL/hr over 30 Minutes Intravenous 60 min pre-op 04/08/11 1206  04/08/11 1951          Assessment Active Problems:  SLEEP APNEA  HYPERGLYCEMIA, FASTING- glucose significantly elevated this AM  Malignant neoplasm of ascending colon-s/p right colectomy 04/08/11  ABL-anemia  Sinus tachycardia with multifocal PVCs   LOS: 1 day   Plan: Hold Heparin.  Start SSI.  Change IVF.  Sips of clear liquids.  Repeat lab 3/2.   [name] 04/09/2011

## 2011-04-09 NOTE — Consult Note (Signed)
Admit date: 04/08/2011 Name: [name] DOB:  01-26-1941 MRN:  [id number] Date of consultation 04/09/2011  Referring Physician:    Dr. Larey Brick  Primary Physician:    Dr. Marga Melnick  Primary Cardiologist:    Dr. Viann Fish  Reason for Consultation:    Postoperative tachycardia  IMPRESSIONS: 1. Sinus tachycardia postoperatively which is nonspecific. He has mild temperature elevation and also could be withdrawing from Adderall. 2. History of mild coronary artery disease with a negative Cardiolite study last summer 3. Anxiety 4. Hyperlipidemia  RECOMMENDATION: Go ahead and initiate low-dose beta blocker therapy for the time being. Resume home medications when he is able to take oral medications. Treat pain and underlying temperature. His EKG was normal and I think this will probably resolve as he recovers from surgery.  HISTORY: This 71 year old male has a history of mild coronary artery disease and hyperlipidemia. He has had some brief paroxysmal atrial fibrillation a few years ago. Has a history of reflux esophagitis. He was recently diagnosed with colon cancer and underwent a laparoscopic colectomy yesterday. Postoperatively he has had occasional PVCs and is gradually had increasing tachycardia. He is treated with Adderall at home and has not been taking this postoperatively. He does not have any angina and denies PND, orthopnea or edema. Please refer to the note in the chart from his office visit last July.   Past Medical History  Diagnosis Date  . Diverticulosis     Sigmoid colon  . Personal history of colonic polyps 09/17/2009    Tubular adenoma  . History of hemorrhoids   . Hyperlipidemia   . Anemia   . Cancer   . 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 Atrial fibrillation 2006    Dr Donnie Aho  . Sleep apnea 03-31-11    dx. 2-3 yrs ago, uses cap nightly  . GERD (gastroesophageal reflux disease) 03-31-11    tx. Omeprazole      Past Surgical History  Procedure  Date  . Inguinal hernia repair   . Cad 2004    Dr Donnie Aho    Allergies:   has no known allergies.   Medications: Prior to Admission medications   Medication Sig Start Date End Date Taking? Authorizing Provider  sildenafil (VIAGRA) 100 MG tablet Take 1 tablet (100 mg total) by mouth daily as needed. 01/19/11  Yes Evette Georges, MD  amphetamine-dextroamphetamine (ADDERALL) 10 MG tablet Take 10 mg by mouth daily.  12/30/10   Historical Provider, MD  ferrous sulfate 325 (65 FE) MG tablet Take 325 mg by mouth daily with breakfast.     Historical Provider, MD  fish oil-omega-3 fatty acids 1000 MG capsule Take 1 g by mouth daily.      Historical Provider, MD  Garlic (ODORLESS GARLIC) 300 MG CAPS Take 300 capsules by mouth daily.     Historical Provider, MD  niacin 500 MG tablet Take 1,000 mg by mouth daily with breakfast.     Historical Provider, MD  omeprazole (PRILOSEC) 10 MG capsule Take 1 capsule (10 mg total) by mouth 2 (two) times daily. 03/01/11   Evette Georges, MD  saw palmetto 160 MG capsule Take 160 mg by mouth daily.      Historical Provider, MD  simvastatin (ZOCOR) 20 MG tablet Take 1 tablet (20 mg total) by mouth at bedtime. 01/19/11   Evette Georges, MD  thiamine (VITAMIN B-1) 100 MG tablet Take 100 mg by mouth daily.      Historical  Provider, MD   Social History:   reports that he quit smoking about 50 years ago. He has never used smokeless tobacco. He reports that he does not drink alcohol or use illicit drugs.   Review of Systems: Negative except for some mild abdominal pain and bloating following surgery. Denies shortness of breath and has no ischemic pain. He has restless leg syndrome at times.  Physical Exam: Blood pressure 129/65, pulse 97, temperature 97.4 F (36.3 C), temperature source Oral, resp. rate 97, height 6\' 2"  (1.88 m), weight 77.4 kg (170 lb 10.2 oz), SpO2 28.00%.    General appearance: Alert white male who is sitting at the bedside and appears  somewhat talkative and anxious but in no acute distress Eyes: negative Neck: no adenopathy, no carotid bruit, no JVD and supple, symmetrical, trachea midline Lungs: clear to auscultation bilaterally Heart: regular rate and rhythm, S1, S2 normal, no murmur, click, rub or gallop Abdomen: Very mild tenderness but no rebound Extremities: extremities normal, atraumatic, no cyanosis or edema Pulses: 2+ and symmetric Neurologic: Grossly normal  Labs: Results for orders placed during the hospital encounter of 04/08/11 (from the past 24 hour(s))  CBC     Status: Abnormal   Collection Time   04/08/11 11:50 PM      Component Value Range   WBC 13.7 (*) 4.0 - 10.5 (K/uL)   RBC 4.09 (*) 4.22 - 5.81 (MIL/uL)   Hemoglobin 11.3 (*) 13.0 - 17.0 (g/dL)   HCT 29.5 (*) 62.1 - 52.0 (%)   MCV 79.5  78.0 - 100.0 (fL)   MCH 27.6  26.0 - 34.0 (pg)   MCHC 34.8  30.0 - 36.0 (g/dL)   RDW 30.8 (*) 65.7 - 15.5 (%)   Platelets 229  150 - 400 (K/uL)  BASIC METABOLIC PANEL     Status: Abnormal   Collection Time   04/09/11  4:38 AM      Component Value Range   Sodium 135  135 - 145 (mEq/L)   Potassium 4.9  3.5 - 5.1 (mEq/L)   Chloride 105  96 - 112 (mEq/L)   CO2 22  19 - 32 (mEq/L)   Glucose, Bld 292 (*) 70 - 99 (mg/dL)   BUN 17  6 - 23 (mg/dL)   Creatinine, Ser 8.46  0.50 - 1.35 (mg/dL)   Calcium 8.2 (*) 8.4 - 10.5 (mg/dL)   GFR calc non Af Amer 58 (*) >90 (mL/min)   GFR calc Af Amer 67 (*) >90 (mL/min)  CBC     Status: Abnormal   Collection Time   04/09/11  4:38 AM      Component Value Range   WBC 10.7 (*) 4.0 - 10.5 (K/uL)   RBC 3.79 (*) 4.22 - 5.81 (MIL/uL)   Hemoglobin 10.5 (*) 13.0 - 17.0 (g/dL)   HCT 96.2 (*) 95.2 - 52.0 (%)   MCV 79.2  78.0 - 100.0 (fL)   MCH 27.7  26.0 - 34.0 (pg)   MCHC 35.0  30.0 - 36.0 (g/dL)   RDW 84.1 (*) 32.4 - 15.5 (%)   Platelets 259  150 - 400 (K/uL)  GLUCOSE, CAPILLARY     Status: Abnormal   Collection Time   04/09/11  9:41 AM      Component Value Range    Glucose-Capillary 206 (*) 70 - 99 (mg/dL)  GLUCOSE, CAPILLARY     Status: Abnormal   Collection Time   04/09/11 11:19 AM      Component Value Range  Glucose-Capillary 170 (*) 70 - 99 (mg/dL)  GLUCOSE, CAPILLARY     Status: Abnormal   Collection Time   04/09/11  4:12 PM      Component Value Range   Glucose-Capillary 143 (*) 70 - 99 (mg/dL)   Comment 1 Documented in Chart     Comment 2 Notify RN        EKG: Postoperatively shows sinus tachycardia but no ischemic ST changes.     Signed:  Darden Palmer MD [facility]   Cardiology Consultant [phone]  04/09/2011, 5:46 PM

## 2011-04-09 NOTE — CV Procedure (Signed)
  ONE DAY STRESS CARDIOLITE   DATE: 10/01/2010  PATIENT:   [name] (February 17, 1940)  COPY:   Dr.  Alonza Smoker  INDICATIONS:   CAD, dyspnea  PROCEDURE: The patient received an intravenous resting injection of 12.0 millicuries of Tc 66m Cardiolite with SPECT acquisition done after an adequate delay.  He then exercised on the standard Bruce protocol a total of 9:15 minutes into Bruce Stage III achieving a workload of 10 METS. The test was stopped due to dyspnea and fatigue. He had no angina. Resting heart rate is 69 bpm.  Peak heart rate is 151 bpm which was 99% of predicted. BP response is normal and rose from 108/68 to 150/80. At a minute prior to the termination of exercise he received a repeat intravenous injection of 31.69millicuries of Tc 36m Cardiolite with SPECT acquisition done after an adequate delay. He tolerated the procedure well.  EKG DATA: Resting EKG is normal.  With exercise he exceeded his target heart rate.  There was 3 mm of flat ST depression noted in the lateral leads with exercise, but no post recovery changes were noted.  He develops PACs in recovery.  SCINTIGRAPHIC DATA:   The quality of the study is good. Review of data in cine format shows no significant motion.  There was mild diaphragmatic attenuation artifact noted. TID = 0.82.  EDV =97 cc.  ESV = 33 cc. Perfusion imaging shows normal myocardial perfusion with no evidence of ischemia or infarction. Quantitative gated SPECT analysis shows an ejection fraction of 66% with normal wall motion and normal wall thickening.  IMPRESSION:   1.   Normal stress Cardiolite study with no evidence of ischemia or infarction 2.   Normal quantitative gated SPECT ejection fraction of  66% with normal wall motion and normal wall thickening 3.  Abnormal EKG response to exercise   Recommendations:  the patient has normal myocardial perfusion and no evidence of ischemia.  Reported to patient and faxed to Dr. Tawanna Cooler.    Darden Palmer.  MD [facility]

## 2011-04-09 NOTE — Progress Notes (Addendum)
[name]  Date of visit:  09/08/2010 DOB:  30-May-1940    Age:  71 yrs. Medical record number:  [id number]     Account number:  [id number] Primary Care Provider: TODD, JEFFREY A ____________________________ CURRENT DIAGNOSES  1. CAD,Native  2. Hyperlipidemia  3. Long Term Use of Other Medicine  4. Arrhythmia-Atrial Fibrillation  5. Sleep apnea ____________________________ ALLERGIES  NKDA ____________________________ MEDICATIONS  1. Niaspan Extended-Release 500 mg Tab, 2 p.o. daily  2. simvastatin 20 mg tablet, 1/2 tab daily  3. aspirin 81 mg tablet, chewable, 1 p.o. q.d.  4. saw palmetto 160 mg capsule, 1 p.o. daily  5. Provigil 100 mg tablet, PRN  6. Fish Oil 1,000 mg capsule, BID ____________________________ CHIEF COMPLAINTS  Followup of Arrhythmia-Atrial Fibrillation  Followup of CAD,Native  Followup of Hyperlipidemia ____________________________ HISTORY OF PRESENT ILLNESS  Patient seen for cardiac followup. He continues to have issues related to feeling poorly when he first gets up in the morning. He recently saw Dr. Donell Beers who wanted him to have a EKG prior starting him on some medication. He is not currently having any angina. Blood pressure is been well controlled and he is really not having much in the way of atrial fibrillation symptoms. He is quite frustrated over his fatigue in the morning and wonders if it is more psychiatric. He denies angina and has no PND, orthopnea, syncope, palpitations or claudication.  ____________________________ PAST HISTORY  Past Medical Illnesses:  sleep apnea uses CPAP, nephrolithiasis, hyperlipidemia, esophageal stricture in past;  Cardiovascular Illnesses:  mild CAD, atrial fibrillation-paroxysmal;  Surgical Procedures:  inguinal herniorrhaphy-left;  Cardiology Procedures-Invasive:  cardiac cath (left) June 2004;  Cardiology Procedures-Noninvasive:  treadmill cardiolite July 2007, echocardiogram December 2006, event monitor December 2006;   Cardiac Cath Results:  normal Left main, scattered irregularities LAD, 30% stenosis proximal Diag 1, scattered irregularities CFX, scattered irregularities RCA;  LVEF of 58% documented via nuclear study on 08/25/2005 ____________________________ CARDIO-PULMONARY TEST DATES EKG Date:  09/08/2010;   Cardiac Cath Date:  07/30/2002;  Holter/Event Monitor Date: 01/25/2005;  Nuclear Study Date:  08/25/2005;   Echocardiography Date: 02/05/2005;  Chest Xray Date: 07/30/2002;   ____________________________ REVIEW OF SYSTEMS   General:  malaise and fatigue  Integumentary:  solar keratosis  Eyes:  cataracts Ears, Nose, Throat, Mouth:  sinusitis, partial hearing loss  Respiratory:  occasional wheezing  Cardiovascular:  please review HPI  Abdominal:  denies dyspepsia, GI bleeding, constipation, or diarrhea  Genitourinary-Male:  occasional incontinence, urgency  Endocrine:  glucose intolerance ____________________________ PHYSICAL EXAMINATION VITAL SIGNS  Blood Pressure:  124/78 Sitting, Right arm, regular cuff  , 120/70 Standing, Right arm and regular cuff   Pulse:  68/min. Weight:  179.00 lbs. Height:  74"BMI: 23  Constitutional:  pleasant white male in no acute distress, looks stated age Skin:  scattered sebaceous cysts Head:  normocephalic, normal hair pattern, no masses or tenderness ENT:  ears, nose and throat reveal no gross abnormalities.  Dentition good. Neck:  supple, no masses, thyromegaly, JVD. Carotid pulses are full and equal bilaterally without bruits. Chest:  normal symmetry, clear to auscultation and percussion. Cardiac:  regular rhythm, normal S1 and S2, No S3 or S4, no murmurs, gallops or rubs detected. Peripheral Pulses:  the femoral,dorsalis pedis, and posterior tibial pulses are full and equal bilaterally with no bruits auscultated. Extremities & Back:  no deformities, clubbing, cyanosis, erythema or edema observed. Normal muscle strength and tone. Neurological:  no gross motor or  sensory deficits noted, affect appropriate, oriented  x3. ____________________________ MOST RECENT LIPID PANEL 09/08/10  CHOL TOTL 117 mg/dl, LDL 59 calc, HDL 48 mg/dl, TRIGLYCER 52 mg/dl and CHOL/HDL 2.4 (Calc) ____________________________ IMPRESSIONS/PLAN  1. Mild coronary artery disease previously 2. Hyperlipidemia under treatment currently at goal 3. Unexplained symptoms of fatigue  Recommendations:  Obtain Cardiolite stress test because of his known coronary disease and fatigue he is feeling. Her fight they find his symptoms somewhat difficult to assess. EKG is normal. ____________________________ TODAYS ORDERS  1. Treadmill 1 day Cardiolite: First Available  2. 12 Lead EKG: Today                       ____________________________ Cardiology Physician:  Darden Palmer MD [facility]

## 2011-04-10 DIAGNOSIS — D62 Acute posthemorrhagic anemia: Secondary | ICD-10-CM | POA: Diagnosis not present

## 2011-04-10 DIAGNOSIS — R000 Tachycardia, unspecified: Secondary | ICD-10-CM | POA: Diagnosis not present

## 2011-04-10 LAB — GLUCOSE, CAPILLARY
Glucose-Capillary: 102 mg/dL — ABNORMAL HIGH (ref 70–99)
Glucose-Capillary: 123 mg/dL — ABNORMAL HIGH (ref 70–99)
Glucose-Capillary: 124 mg/dL — ABNORMAL HIGH (ref 70–99)
Glucose-Capillary: 130 mg/dL — ABNORMAL HIGH (ref 70–99)
Glucose-Capillary: 99 mg/dL (ref 70–99)

## 2011-04-10 LAB — BASIC METABOLIC PANEL
GFR calc non Af Amer: 82 mL/min — ABNORMAL LOW (ref >90)
Glucose, Bld: 122 mg/dL — ABNORMAL HIGH (ref 70–99)
Potassium: 4.3 mEq/L (ref 3.5–5.1)
Sodium: 136 mEq/L (ref 135–145)

## 2011-04-10 LAB — CBC
Hemoglobin: 7.7 g/dL — ABNORMAL LOW (ref 13.0–17.0)
MCH: 28.3 pg (ref 26.0–34.0)
RBC: 2.72 MIL/uL — ABNORMAL LOW (ref 4.22–5.81)
WBC: 9.4 10*3/uL (ref 4.0–10.5)

## 2011-04-10 NOTE — Progress Notes (Signed)
2 Days Post-Op  Subjective: Feels good.  No dizziness.  No flatus or BM.  No n/v.  Feels a little bloated.  Tolerating clear liquids.  Objective: Vital signs in last 24 hours: Temp:  [97.4 F (36.3 C)-98.5 F (36.9 C)] 98.5 F (36.9 C) (03/02 4098) Pulse Rate:  [91-107] 91  (03/02 0632) Resp:  [16-97] 16  (03/02 0850) BP: (106-129)/(53-65) 118/55 mmHg (03/02 0632) SpO2:  [96 %-99 %] 96 % (03/02 0850) FiO2 (%):  [28 %] 28 % (03/01 1200) Last BM Date: 04/08/11  Intake/Output from previous day: 03/01 0701 - 03/02 0700 In: 1479.2 [I.V.:1479.2] Out: 1300 [Urine:1300] Intake/Output this shift:    PE: Abd-soft, slightly distended, + BS, dressings dry  Lab Results:   [facility] 04/10/11 0523 04/09/11 0438  WBC 9.4 10.7*  HGB 7.7* 10.5*  HCT 21.8* 30.0*  PLT 158 259   BMET  Basename 04/10/11 0523 04/09/11 0438  NA 136 135  K 4.3 4.9  CL 106 105  CO2 27 22  GLUCOSE 122* 292*  BUN 14 17  CREATININE 0.99 1.24  CALCIUM 8.1* 8.2*   PT/INR No results found for this basename: LABPROT:2,INR:2 in the last 72 hours Comprehensive Metabolic Panel:    Component Value Date/Time   NA 136 04/10/2011 0523   K 4.3 04/10/2011 0523   CL 106 04/10/2011 0523   CO2 27 04/10/2011 0523   BUN 14 04/10/2011 0523   CREATININE 0.99 04/10/2011 0523   GLUCOSE 122* 04/10/2011 0523   CALCIUM 8.1* 04/10/2011 0523   AST 17 03/31/2011 0930   ALT 18 03/31/2011 0930   ALKPHOS 79 03/31/2011 0930   BILITOT 0.5 03/31/2011 0930   PROT 7.0 03/31/2011 0930   ALBUMIN 3.9 03/31/2011 0930     Studies/Results: No results found.  Anti-infectives: Anti-infectives     Start     Dose/Rate Route Frequency Ordered Stop   04/08/11 2030   cefOXitin (MEFOXIN) 1 g in dextrose 5 % 50 mL IVPB        1 g 100 mL/hr over 30 Minutes Intravenous Every 6 hours 04/08/11 1947 04/09/11 1019   04/08/11 1230   cefOXitin (MEFOXIN) 1 g in dextrose 5 % 50 mL IVPB  Status:  Discontinued        1 g 100 mL/hr over 30 Minutes Intravenous 60 min  pre-op 04/08/11 1206 04/08/11 1951          Assessment Principal Problem:  *Malignant neoplasm of ascending colon Active Problems:  Obstructive sleep apnea  HYPERGLYCEMIA, FASTING  CAD (coronary artery disease)  Nephrolithiasis  Tachycardia  Postoperative anemia due to acute blood loss-postop blood loss and hemodilution; do not feel he needs a transfusion at this time.    LOS: 2 days   Plan: D/C foley.  Decrease IVF.  Leave on clear liquids.  Recheck hemoglobin 3/3.   [name] J 04/10/2011

## 2011-04-10 NOTE — Progress Notes (Signed)
Pt placed on cpap, autobilevel mode with 2l o2 bleedin, pt is wearing his full face mask from home and tolerating well at this time.  ETCO2 monitoring device removed per pt request upon placing cpap mask.  Spoke with [facility], Joe, regarding this.   Pt is now on bedside pulseox and will be monitored throughout the night.  Eustace Pen, RN aware.

## 2011-04-10 NOTE — Progress Notes (Signed)
@   Subjective:  Denies CP or dyspnea; abdominal pain improving   Objective:  Filed Vitals:   04/10/11 0012 04/10/11 0152 04/10/11 0404 04/10/11 0632  BP:  106/53  118/55  Pulse:  93  91  Temp:  98.3 F (36.8 C)  98.5 F (36.9 C)  TempSrc:  Oral  Oral  Resp: 18 16 18 20   Height:      Weight:      SpO2: 96% 99% 98% 96%    Intake/Output from previous day:  Intake/Output Summary (Last 24 hours) at 04/10/11 0730 Last data filed at 04/10/11 0650  Gross per 24 hour  Intake 1479.17 ml  Output   1300 ml  Net 179.17 ml    Physical Exam: Physical exam: Well-developed well-nourished in no acute distress.  Skin is warm and dry.  HEENT is normal.  Neck is supple. No thyromegaly.  Chest is clear to auscultation with normal expansion.  Cardiovascular exam is regular rate and rhythm.  Abdominal exam s/p abdominal surgery; mild tenderness Extremities show no edema. neuro grossly intact    Lab Results: Basic Metabolic Panel:  Basename 04/10/11 0523 04/09/11 0438  NA 136 135  K 4.3 4.9  CL 106 105  CO2 27 22  GLUCOSE 122* 292*  BUN 14 17  CREATININE 0.99 1.24  CALCIUM 8.1* 8.2*  MG -- --  PHOS -- --   CBC:  Basename 04/10/11 0523 04/09/11 0438  WBC 9.4 10.7*  NEUTROABS -- --  HGB 7.7* 10.5*  HCT 21.8* 30.0*  MCV 80.1 79.2  PLT 158 259   Cardiac Enzymes: No results found for this basename: CKTOTAL:3,CKMB:3,CKMBINDEX:3,TROPONINI:3 in the last 72 hours   Assessment/Plan:  1) Sinus tachycardia-most likely related to hyperadrenergic post op state, pain, anemia. Continue beta blocker. Telemetry reviewed and HR improved. 2) S/P abdominal surgery- per surgery 3) Acute blood loss anemia-may need transfusion. Will leave to primary service. Resume all preadmission meds at DC. We will see again Monday. Please call prior to then with questions. FU Dr Donnie Aho for cardiac issues after DC.   Olga Millers 04/10/2011, 7:30 AM

## 2011-04-11 LAB — GLUCOSE, CAPILLARY
Glucose-Capillary: 111 mg/dL — ABNORMAL HIGH (ref 70–99)
Glucose-Capillary: 104 mg/dL — ABNORMAL HIGH (ref 70–99)

## 2011-04-11 LAB — CBC
MCHC: 34.8 g/dL (ref 30.0–36.0)
Platelets: 158 10*3/uL (ref 150–400)
RDW: 16.6 % — ABNORMAL HIGH (ref 11.5–15.5)
WBC: 6.5 10*3/uL (ref 4.0–10.5)

## 2011-04-11 MED ORDER — SIMETHICONE 40 MG/0.6ML PO SUSP
40.0000 mg | Freq: Once | ORAL | Status: AC
  Administered 2011-04-11: 40 mg via ORAL
  Filled 2011-04-11: qty 0.6

## 2011-04-11 MED ORDER — FERROUS SULFATE 325 (65 FE) MG PO TABS
325.0000 mg | ORAL_TABLET | Freq: Three times a day (TID) | ORAL | Status: DC
  Administered 2011-04-11 – 2011-04-12 (×3): 325 mg via ORAL
  Filled 2011-04-11 (×5): qty 1

## 2011-04-11 MED ORDER — OXYCODONE-ACETAMINOPHEN 5-325 MG PO TABS
1.0000 | ORAL_TABLET | ORAL | Status: DC | PRN
  Administered 2011-04-12: 2 via ORAL
  Filled 2011-04-11: qty 2

## 2011-04-11 NOTE — Progress Notes (Signed)
3 Days Post-Op  Subjective: Bowels moving.  Voiding well.  No nausea.  Objective: Vital signs in last 24 hours: Temp:  [98.1 F (36.7 C)-99.1 F (37.3 C)] 98.6 F (37 C) (03/03 0422) Pulse Rate:  [86-98] 90  (03/03 0509) Resp:  [16-21] 18  (03/03 0422) BP: (114-122)/(56-66) 122/56 mmHg (03/03 0422) SpO2:  [96 %-100 %] 98 % (03/03 0422) Last BM Date: 04/08/11  Intake/Output from previous day: 03/02 0701 - 03/03 0700 In: 1625 [P.O.:480; I.V.:1145] Out: 3050 [Urine:3050] Intake/Output this shift: Total I/O In: 480 [P.O.:480] Out: -   PE: Abd-soft, incisions clean and intact, significant left flank ecchmyosis-remote from incision sites  Lab Results:   Basename 04/11/11 0548 04/10/11 0523  WBC 6.5 9.4  HGB 7.8* 7.7*  HCT 22.4* 21.8*  PLT 158 158   BMET  Basename 04/10/11 0523 04/09/11 0438  NA 136 135  K 4.3 4.9  CL 106 105  CO2 27 22  GLUCOSE 122* 292*  BUN 14 17  CREATININE 0.99 1.24  CALCIUM 8.1* 8.2*   PT/INR No results found for this basename: LABPROT:2,INR:2 in the last 72 hours Comprehensive Metabolic Panel:    Component Value Date/Time   NA 136 04/10/2011 0523   K 4.3 04/10/2011 0523   CL 106 04/10/2011 0523   CO2 27 04/10/2011 0523   BUN 14 04/10/2011 0523   CREATININE 0.99 04/10/2011 0523   GLUCOSE 122* 04/10/2011 0523   CALCIUM 8.1* 04/10/2011 0523   AST 17 03/31/2011 0930   ALT 18 03/31/2011 0930   ALKPHOS 79 03/31/2011 0930   BILITOT 0.5 03/31/2011 0930   PROT 7.0 03/31/2011 0930   ALBUMIN 3.9 03/31/2011 0930     Studies/Results: No results found.  Anti-infectives: Anti-infectives     Start     Dose/Rate Route Frequency Ordered Stop   04/08/11 2030   cefOXitin (MEFOXIN) 1 g in dextrose 5 % 50 mL IVPB        1 g 100 mL/hr over 30 Minutes Intravenous Every 6 hours 04/08/11 1947 04/09/11 1019   04/08/11 1230   cefOXitin (MEFOXIN) 1 g in dextrose 5 % 50 mL IVPB  Status:  Discontinued        1 g 100 mL/hr over 30 Minutes Intravenous 60 min pre-op  04/08/11 1206 04/08/11 1951          Assessment Principal Problem:  *Malignant neoplasm of ascending colon s/p right colectomy-bowel function returning. Active Problems:  Obstructive sleep apnea  HYPERGLYCEMIA, FASTING  CAD (coronary artery disease)  Nephrolithiasis  Tachycardia  Postoperative anemia due to acute blood loss-hgb stable; large left flank hematoma present this am    LOS: 3 days   Plan: D/C PCA.  Oral analgesic.  Bland diet. Start Iron.   [name] 04/11/2011

## 2011-04-11 NOTE — Progress Notes (Signed)
Placed patient on CPAP 11 cmh2o with home full face mask, patient no longer needing supplemental oxygen, patient tolerating well at this time.

## 2011-04-11 NOTE — Plan of Care (Signed)
Problem: Phase II Progression Outcomes Goal: Foley discontinued Outcome: Completed/Met Date Met:  04/11/11 04/10/2011

## 2011-04-12 LAB — GLUCOSE, CAPILLARY
Glucose-Capillary: 109 mg/dL — ABNORMAL HIGH (ref 70–99)
Glucose-Capillary: 110 mg/dL — ABNORMAL HIGH (ref 70–99)
Glucose-Capillary: 111 mg/dL — ABNORMAL HIGH (ref 70–99)

## 2011-04-12 LAB — CBC
HCT: 20.8 % — ABNORMAL LOW (ref 39.0–52.0)
Hemoglobin: 7.5 g/dL — ABNORMAL LOW (ref 13.0–17.0)
MCHC: 36.1 g/dL — ABNORMAL HIGH (ref 30.0–36.0)
MCV: 79.4 fL (ref 78.0–100.0)
RDW: 16.1 % — ABNORMAL HIGH (ref 11.5–15.5)

## 2011-04-12 MED ORDER — METOPROLOL TARTRATE 25 MG PO TABS
25.0000 mg | ORAL_TABLET | Freq: Two times a day (BID) | ORAL | Status: DC
  Filled 2011-04-12: qty 1

## 2011-04-12 MED ORDER — METOPROLOL TARTRATE 25 MG PO TABS: 25.0000 mg | ORAL_TABLET | Freq: Two times a day (BID) | ORAL | Status: DC

## 2011-04-12 MED ORDER — OXYCODONE-ACETAMINOPHEN 5-325 MG PO TABS: 1.0000 | ORAL_TABLET | ORAL | Status: AC | PRN

## 2011-04-12 MED ORDER — FERROUS SULFATE 325 (65 FE) MG PO TABS: 325.0000 mg | ORAL_TABLET | Freq: Three times a day (TID) | ORAL | Status: DC

## 2011-04-12 NOTE — Discharge Summary (Signed)
Physician Discharge Summary  Patient ID: [name] MRN: [id number] DOB/AGE: December 23, 1940 71 y.o.  Admit date: 04/08/2011 Discharge date: 04/12/2011  Admission Diagnoses:  Ascending colon cancer  Discharge Diagnoses: Same Principal Problem:  *Malignant neoplasm of ascending colon Active Problems:  Obstructive sleep apnea  HYPERGLYCEMIA, FASTING  CAD (coronary artery disease)  Nephrolithiasis  Tachycardia  Postoperative anemia due to acute blood loss   Discharged Condition: good  Hospital Course:   He was admitted on 04/08/11 and underwent a laparoscopic assisted right colectomy.  Postoperatively he had some tachycardia and PVCs likely related to the stress of the operation and acute blood loss anemia (hemoglobin declined to 7.5-7.8).  He developed a large left flank hematoma which I felt was the main source of the blood loss.  He was asymptomatic with respect to his anemia.  He also has a hx of hyperglycemia and he was put on SSI for that.    A Cardiology consultation was obtained because of the frequent PVCs and tachycardia.  He was started on bid Metoprolol which he tolerated well.  The tachycardia resolved.  He had a mild ileus which resolved with time and his diet was able to be advanced.    The day of discharge, he was ambulating well, tolerating a bland diet, and moving his bowels.  His staples were left in his lower midline incision.  His final pathology is still pending.  Consults: cardiology  Significant Diagnostic Studies: none  Treatments: surgery: Laparoscopic assisted right colectomy  04/08/11.  Discharge Exam: Blood pressure 121/63, pulse 85, temperature 97.9 F (36.6 C), temperature source Axillary, resp. rate 16, height 6\' 2"  (1.88 m), weight 170 lb 10.2 oz (77.4 kg), SpO2 95.00%. PE:    Abd- wounds are clean and intact, large left flank hematoma radiating down toward the left thigh.  Disposition:   Discharge to home in satisfactory condition.    Discharge  Orders    Future Appointments: Provider: Department: Dept Phone: Center:   04/16/2011 9:00 AM [facility] [place] [phone] None   04/19/2011 1:00 PM Dava Najjar Elie Goody [facility] [phone] None   04/19/2011 1:30 PM [facility] Counselor [facility] [phone] None   04/19/2011 2:00 PM Lucile Shutters, MD [facility] [phone] None     Medication List  As of 04/12/2011  9:51 AM   TAKE these medications         amphetamine-dextroamphetamine 10 MG tablet   Commonly known as: ADDERALL   Take 10 mg by mouth daily.      ferrous sulfate 325 (65 FE) MG tablet   Take 1 tablet (325 mg total) by mouth 3 (three) times daily with meals.      fish oil-omega-3 fatty acids 1000 MG capsule   Take 1 g by mouth daily.      metoprolol tartrate 25 MG tablet   Commonly known as: LOPRESSOR   Take 1 tablet (25 mg total) by mouth 2 (two) times daily.      niacin 500 MG tablet   Take 1,000 mg by mouth daily with breakfast.      Odorless Garlic 300 MG Caps   Generic drug: Garlic   Take 300 capsules by mouth daily.      omeprazole 10 MG capsule   Commonly known as: PRILOSEC   Take 1 capsule (10 mg total) by mouth 2 (two) times daily.      oxyCODONE-acetaminophen 5-325 MG per tablet   Commonly known as: PERCOCET   Take  1-2 tablets by mouth every 4 (four) hours as needed.      saw palmetto 160 MG capsule   Take 160 mg by mouth daily.      sildenafil 100 MG tablet   Commonly known as: VIAGRA   Take 1 tablet (100 mg total) by mouth daily as needed.      simvastatin 20 MG tablet   Commonly known as: ZOCOR   Take 1 tablet (20 mg total) by mouth at bedtime.      thiamine 100 MG tablet   Commonly known as: VITAMIN B-1   Take 100 mg by mouth daily.             Signed: Adolph [name] 04/12/2011, 9:51 AM

## 2011-04-12 NOTE — Progress Notes (Signed)
Subjective:  Feels well, No SOB or chest pain.  Objective:  Vital Signs in the last 24 hours: BP 121/63  Pulse 85  Temp(Src) 97.9 F (36.6 C) (Axillary)  Resp 16  Ht 6\' 2"  (1.88 m)  Wt 77.4 kg (170 lb 10.2 oz)  BMI 21.91 kg/m2  SpO2 95%  Physical Exam: Pleasant WM in NAD Lungs:  Clear Cardiac:  Regular rhythm, normal S1 and S2, no S3 Abdomen:  Large hematoma present in the left flank Extremities:  No edema present  Intake/Output from previous day: 03/03 0701 - 03/04 0700 In: 1986.7 [P.O.:480; I.V.:1506.7] Out: 920 [Urine:920]  Lab Results: Basic Metabolic Panel:  [facility] 04/10/11 0523  NA 136  K 4.3  CL 106  CO2 27  GLUCOSE 122*  BUN 14  CREATININE 0.99    CBC:  Basename 04/12/11 0459 04/11/11 0548  WBC 4.7 6.5  NEUTROABS -- --  HGB 7.5* 7.8*  HCT 20.8* 22.4*  MCV 79.4 80.6  PLT 154 158   Telemetry: Reviewed  Sinus with PVC's  Assessment/Plan:  1. Post op tachycardia resolved prob secondary to post op adrenergic state and anemia 2. Acute blood loss anemia ?flank hematoma as source is there anything internal? 3. History of mild CAD  Red:  Reduce beta blocker to BID.  Hgb down further ? If transfuse or watchful waiting.  Patient is asymptomatic.  I would continue beta blocker another 2 weeks post d/c.     [name].  MD [facility] 04/12/2011, 7:45 AM

## 2011-04-12 NOTE — Discharge Instructions (Signed)
[facility]      [facility], [place] [phone]  OPEN ABDOMINAL SURGERY: POST OP INSTRUCTIONS  Always review your discharge instruction sheet given to you by the facility where your surgery was performed.  IF YOU HAVE DISABILITY OR FAMILY LEAVE FORMS, YOU MUST BRING THEM TO THE OFFICE FOR PROCESSING.  PLEASE DO NOT GIVE THEM TO YOUR DOCTOR.  1. A prescription for pain medication may be given to you upon discharge.  Take your pain medication as prescribed, if needed.  If narcotic pain medicine is not needed, then you may take acetaminophen (Tylenol) or ibuprofen (Advil) as needed. 2. Take your usually prescribed medications unless otherwise directed. 3. If you need a refill on your pain medication, please contact your pharmacy. They will contact our office to request authorization.  Prescriptions will not be filled after 5pm or on week-ends. 4.  A high-fiber, low carbohydrate diet is recommended.   Be sure to include lots of fluids daily.  5. Most patients will experience some swelling and bruising in the area of the incision. Ice pack will help. Swelling and bruising can take several day or weeks to resolve..  6. It is common to experience some constipation if taking pain medication after surgery.  Increasing fluid intake and taking a stool softener will usually help or prevent this problem from occurring.  A mild laxative (Milk of Magnesia or Miralax) should be taken according to package directions if there are no bowel movements after 48 hours. 7.   Any sutures or staples will be removed at the office during your follow-up visit. You may find that a light gauze bandage over your incision may keep your staples from being rubbed or pulled. You may shower and replace the bandage daily. 8. ACTIVITIES:  You may resume regular (light) daily activities beginning the next day--such as daily self-care, walking, climbing stairs--gradually increasing activities as tolerated.  You may have sexual  intercourse when it is comfortable.  Refrain from any heavy lifting or straining-nothing over 10 pounds for 6 weeks. a. You may drive when you no longer are taking prescription pain medication, you can comfortably wear a seatbelt, and you can safely maneuver your car and apply brakes b. Return to Work: ___________________________________ 9. Office appointment Friday, March 8 at 9:00 am with Cyndra Numbers (Dr. Maris Berger assistant) to have staples removed.  We will also send you to get your blood drawn to check your blood count. OTHER INSTRUCTIONS:  _____________________________________________________________ _____________________________________________________________  WHEN TO CALL YOUR DOCTOR: 1. Fever over 101.5 2. Inability to urinate 3. Nausea and/or vomiting 4. Extreme swelling or bruising 5. Continued bleeding from incision. 6. Increased pain, redness, or drainage from the incision. 7. Bloody stools.  The clinic staff is available to answer your questions during regular business hours.  Please don't hesitate to call and ask to speak to one of the nurses if you have concerns.  For further questions, please visit [email or web address]

## 2011-04-12 NOTE — Progress Notes (Signed)
4 Days Post-Op  Subjective: Feels good.  Having liquid BMs (dark green liquid stool in commode).  Tolerating diet.  Objective: Vital signs in last 24 hours: Temp:  [97.9 F (36.6 C)-99 F (37.2 C)] 97.9 F (36.6 C) (03/04 0452) Pulse Rate:  [85-97] 85  (03/04 0452) Resp:  [16-20] 16  (03/04 0452) BP: (100-128)/(48-63) 121/63 mmHg (03/04 0452) SpO2:  [95 %-96 %] 95 % (03/04 0452) Last BM Date: 04/11/11  Intake/Output from previous day: 03/03 0701 - 03/04 0700 In: 1986.7 [P.O.:480; I.V.:1506.7] Out: 920 [Urine:920] Intake/Output this shift:    PE: Abd-soft, incisions clean and intact, large left flank hematoma.  Lab Results:   Basename 04/12/11 0459 04/11/11 0548  WBC 4.7 6.5  HGB 7.5* 7.8*  HCT 20.8* 22.4*  PLT 154 158   BMET  Basename 04/10/11 0523  NA 136  K 4.3  CL 106  CO2 27  GLUCOSE 122*  BUN 14  CREATININE 0.99  CALCIUM 8.1*   PT/INR No results found for this basename: LABPROT:2,INR:2 in the last 72 hours Comprehensive Metabolic Panel:    Component Value Date/Time   NA 136 04/10/2011 0523   K 4.3 04/10/2011 0523   CL 106 04/10/2011 0523   CO2 27 04/10/2011 0523   BUN 14 04/10/2011 0523   CREATININE 0.99 04/10/2011 0523   GLUCOSE 122* 04/10/2011 0523   CALCIUM 8.1* 04/10/2011 0523   AST 17 03/31/2011 0930   ALT 18 03/31/2011 0930   ALKPHOS 79 03/31/2011 0930   BILITOT 0.5 03/31/2011 0930   PROT 7.0 03/31/2011 0930   ALBUMIN 3.9 03/31/2011 0930     Studies/Results: No results found.  Anti-infectives: Anti-infectives     Start     Dose/Rate Route Frequency Ordered Stop   04/08/11 2030   cefOXitin (MEFOXIN) 1 g in dextrose 5 % 50 mL IVPB        1 g 100 mL/hr over 30 Minutes Intravenous Every 6 hours 04/08/11 1947 04/09/11 1019   04/08/11 1230   cefOXitin (MEFOXIN) 1 g in dextrose 5 % 50 mL IVPB  Status:  Discontinued        1 g 100 mL/hr over 30 Minutes Intravenous 60 min pre-op 04/08/11 1206 04/08/11 1951          Assessment Principal Problem:  *Malignant neoplasm of ascending colon-final path is pending. Active Problems:  Obstructive sleep apnea  HYPERGLYCEMIA, FASTING  CAD (coronary artery disease)  Nephrolithiasis  Tachycardia-resolved  Postoperative anemia due to acute blood loss- from flank hematoma; stable; he is asx.    LOS: 4 days   Plan: Discharge to home.  Return in 4 days for staple removal and to check hgb.  Instructions given.     [name] J 04/12/2011

## 2011-04-14 ENCOUNTER — Telehealth (INDEPENDENT_AMBULATORY_CARE_PROVIDER_SITE_OTHER): Payer: Self-pay | Admitting: General Surgery

## 2011-04-14 NOTE — Telephone Encounter (Signed)
I spoke with [name] regarding his pathology results.  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 This makes him a stage III colon cancer.  I discussed this with him. He has an appointment with Dr. Truett Perna in 5 days. I told him to keep this appointment.  He'll be seen in the office Friday to have his staples removed.

## 2011-04-16 ENCOUNTER — Encounter (INDEPENDENT_AMBULATORY_CARE_PROVIDER_SITE_OTHER): Payer: Self-pay

## 2011-04-16 ENCOUNTER — Ambulatory Visit (INDEPENDENT_AMBULATORY_CARE_PROVIDER_SITE_OTHER): Payer: Medicare Other

## 2011-04-16 VITALS — BP 116/68 | HR 68 | Temp 97.4°F | Resp 16 | Ht 74.0 in | Wt 170.4 lb

## 2011-04-16 DIAGNOSIS — K912 Postsurgical malabsorption, not elsewhere classified: Secondary | ICD-10-CM

## 2011-04-16 NOTE — Progress Notes (Signed)
Staples removed without difficulty. Steri-strips applied, site unremarkable.

## 2011-04-17 LAB — CBC WITH DIFFERENTIAL/PLATELET
Basophils Relative: 0 % (ref 0–1)
Eosinophils Absolute: 0.2 10*3/uL (ref 0.0–0.7)
Hemoglobin: 9.2 g/dL — ABNORMAL LOW (ref 13.0–17.0)
Lymphs Abs: 0.9 10*3/uL (ref 0.7–4.0)
MCH: 27.7 pg (ref 26.0–34.0)
MCHC: 32.5 g/dL (ref 30.0–36.0)
Monocytes Relative: 10 % (ref 3–12)
Neutro Abs: 3.7 10*3/uL (ref 1.7–7.7)
Neutrophils Relative %: 69 % (ref 43–77)
Platelets: 322 10*3/uL (ref 150–400)
RBC: 3.32 MIL/uL — ABNORMAL LOW (ref 4.22–5.81)

## 2011-04-17 LAB — IRON AND TIBC
Iron: 80 ug/dL (ref 42–165)
UIBC: 250 ug/dL (ref 125–400)

## 2011-04-19 ENCOUNTER — Telehealth: Payer: Self-pay | Admitting: Oncology

## 2011-04-19 ENCOUNTER — Telehealth (INDEPENDENT_AMBULATORY_CARE_PROVIDER_SITE_OTHER): Payer: Self-pay | Admitting: General Surgery

## 2011-04-19 ENCOUNTER — Ambulatory Visit (HOSPITAL_BASED_OUTPATIENT_CLINIC_OR_DEPARTMENT_OTHER): Payer: Medicare Other | Admitting: Oncology

## 2011-04-19 ENCOUNTER — Other Ambulatory Visit: Payer: Medicare Other | Admitting: Lab

## 2011-04-19 ENCOUNTER — Ambulatory Visit: Payer: Medicare Other

## 2011-04-19 VITALS — BP 107/57 | HR 68 | Temp 98.0°F | Ht 74.0 in | Wt 168.3 lb

## 2011-04-19 DIAGNOSIS — C182 Malignant neoplasm of ascending colon: Secondary | ICD-10-CM

## 2011-04-19 DIAGNOSIS — D62 Acute posthemorrhagic anemia: Secondary | ICD-10-CM

## 2011-04-19 DIAGNOSIS — I4891 Unspecified atrial fibrillation: Secondary | ICD-10-CM

## 2011-04-19 NOTE — Telephone Encounter (Signed)
Pt given genetics appt w/all other appts prior to leaving today.

## 2011-04-19 NOTE — Telephone Encounter (Signed)
Gv pt appt for march-april2013.  scheduled ct scan on 03/14 @ [facility]

## 2011-04-19 NOTE — Progress Notes (Signed)
Met with patient and explained navigator role.  This RN provided pt with education on disease and was given resource numbers.  Referral made to dietician.  Explained other resources available to patient such as SW.  Gave patient information about upcoming support group in April.  Encouraged patient to call for any questions or assistance.  Will continue to follow.

## 2011-04-19 NOTE — Progress Notes (Signed)
Referring MD: Lorne Skeens Stahlecker 71 y.o.  03-14-1940    Reason for Referral: New diagnosis of colon cancer.     HPI: He was diagnosed with anemia on a routine physical by Dr. Tawanna Cooler. He was referred to Dr. Arlyce Dice and underwent an upper endoscopy on 01/25/2011. This was a normal study. He was referred for a capsule endoscopy on 02/17/2011. There was a question of erythema in the cecum. He was taken for a repeat colonoscopy (he last had a colonoscopy in August of 2011) on 03/03/2011. A mass was found at the ileocecal valve that was circumferential and ulcerated. Biopsies were taken. A sessile polyp was found in the descending colon. Diverticulosis was noted in the sigmoid colon. The pathology confirmed high-grade glandular dysplasia involving the splenic flexure polyp. Adenocarcinoma was noted at the ileocecal valve biopsy.  He was referred for a CT of the abdomen and pelvis on 03/05/2011. The lung bases are clear, no pericardial or pleural effusion. Numerous well circumscribed hypoattenuating liver lesions were favored to represent cyst and bile duct hamartomas. There was a question of peripheral nodular enhancement within the lateral segment of the left lobe. The adrenal glands appeared normal. Bilateral renal cysts. No retroperitoneal or retrocrural adenopathy. Subtle soft tissue fullness was noted at the medial aspect of the ascending colon/cecum. No obstruction. Prominent ileocolic mesenteric nodes were seen. No evidence of omental or peritoneal disease. A pelvic adenopathy. He was referred to Dr. Abbey Chatters and was taken to the operating room on 04/08/2011 and underwent a laparoscopic hand-assisted right colectomy and resection of terminal ileum. A tumor was noted at the cecum. Multiple cysts were noted in the liver.  He developed a large post operative left flank hematoma with resulting anemia. He remains on iron therapy.  The pathology([id number]) confirmed an invasive  moderately differentiated adenocarcinoma invading through the muscular propria into the pericolonic fat a tissue and involving the inked serosa. 2 of 12 lymph nodes were positive for metastatic carcinoma. The resection margins were negative. Lymphovascular and perineural invasion were not identified. A Crohn's like reaction was noted at the periphery of the tumor.  The tumor was submitted for microsatellite instability testing and returned with a microsatellite stable genotype. The tumor was K-ras wild-type.  He now feels well.   Past Medical History  Diagnosis Date  . Diverticulosis     Sigmoid colon  . Personal history of colonic polyps 09/17/2009    Tubular adenoma  . History of hemorrhoids   . Hyperlipidemia   . Anemia-microcytic, iron deficiency   December 2012   . Cancer-: (T4 N1) moderately differentiated adenocarcinoma of the cecum   04/08/2011   . 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 Atrial fibrillation 2006    Dr Donnie Aho  . Sleep apnea 03-31-11    dx. 2-3 yrs ago, uses cap nightly  . GERD (gastroesophageal reflux disease) 03-31-11    tx. Omeprazole  .  History of esophageal stricture . Brain injury following trauma in 1987  Past Surgical History  Procedure Date  . Inguinal hernia repair  1990   . Cad 2004    Dr Donnie Aho    Family History  Problem Relation Age of Onset  . Diabetes Father   . Prostate cancer Father   . Heart attack Father 64  . Heart failure Mother     Congestive heart failure  . Colon cancer Brother  50   . Stomach cancer Sister  45   . Coronary artery  disease Brother     CABG   .    Colon cancer                                              Nephew       20's  Current outpatient prescriptions:amphetamine-dextroamphetamine (ADDERALL) 10 MG tablet, Take 10 mg by mouth daily. , Disp: , Rfl: ;  ferrous sulfate 325 (65 FE) MG tablet, Take 1 tablet (325 mg total) by mouth 3 (three) times daily with meals., Disp: 90 tablet, Rfl: 3;  fish oil-omega-3 fatty  acids 1000 MG capsule, Take 1 g by mouth daily.  , Disp: , Rfl: ;  Garlic (ODORLESS GARLIC) 300 MG CAPS, Take 300 capsules by mouth daily. , Disp: , Rfl:  metoprolol tartrate (LOPRESSOR) 25 MG tablet, Take 1 tablet (25 mg total) by mouth 2 (two) times daily., Disp: 28 tablet, Rfl: 0;  niacin 500 MG tablet, Take 1,000 mg by mouth daily with breakfast. , Disp: , Rfl: ;  omeprazole (PRILOSEC) 10 MG capsule, Take 1 capsule (10 mg total) by mouth 2 (two) times daily., Disp: 180 capsule, Rfl: 3 oxyCODONE-acetaminophen (PERCOCET) 5-325 MG per tablet, Take 1-2 tablets by mouth every 4 (four) hours as needed., Disp: 30 tablet, Rfl: 0;  saw palmetto 160 MG capsule, Take 160 mg by mouth daily.  , Disp: , Rfl: ;  sildenafil (VIAGRA) 100 MG tablet, Take 1 tablet (100 mg total) by mouth daily as needed., Disp: 6 tablet, Rfl: 11;  simvastatin (ZOCOR) 20 MG tablet, Take 1 tablet (20 mg total) by mouth at bedtime., Disp: 100 tablet, Rfl: 3 thiamine (VITAMIN B-1) 100 MG tablet, Take 100 mg by mouth daily.  , Disp: , Rfl:   Allergies: No Known Allergies  Social History:   He lives in [place]. 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 He quit smoking cigarettes in his 12s. He does not use alcohol. He has no transfusion history. He denies risk factors for HIV and hepatitis. He was in the [employer] and [employer]  History  Alcohol Use No    History  Smoking status  . Former Smoker -- 7 years  . Quit date: 03/21/1961  Smokeless tobacco  . Never Used     ROS:   Positives include: Intentional 20 pound weight loss with dieting followed by a 12 pound weight loss surrounding the colon cancer diagnosis.  A complete ROS was otherwise negative.  Physical Exam:  Blood pressure 107/57, pulse 68, temperature 98 F (36.7 C), temperature source Oral, height 6\' 2"  (1.88 m), weight 168 lb 4.8 oz (76.34 kg).  HEENT: Oropharynx without visible mass, neck without mass Lungs: Clear  bilaterally Cardiac: Regular rate and rhythm with premature beats Abdomen: Healed midline incision. No hepatomegaly, no mass GU: Testes without mass, ecchymosis at the penis and scrotum  Vascular: No leg edema Lymph nodes: No cervical, supraclavicular, axillary, or inguinal nodes Neurologic: Alert and oriented. The motor examination appears grossly intact. Skin: Resolving ecchymosis at the left abdomen, flank, left thigh, scrotum, and penis    LAB:  CBC  Lab Results  Component Value Date   WBC 5.4 04/16/2011   HGB 9.2* 04/16/2011   HCT 28.3* 04/16/2011   MCV 85.2 04/16/2011   PLT 322 04/16/2011     CMP      Component Value Date/Time  NA 136 04/10/2011 0523   K 4.3 04/10/2011 0523   CL 106 04/10/2011 0523   CO2 27 04/10/2011 0523   GLUCOSE 122* 04/10/2011 0523   BUN 14 04/10/2011 0523   CREATININE 0.99 04/10/2011 0523   CALCIUM 8.1* 04/10/2011 0523   PROT 7.0 03/31/2011 0930   ALBUMIN 3.9 03/31/2011 0930   AST 17 03/31/2011 0930   ALT 18 03/31/2011 0930   ALKPHOS 79 03/31/2011 0930   BILITOT 0.5 03/31/2011 0930   GFRNONAA 82* 04/10/2011 0523   GFRAA >90 04/10/2011 0523   CEA 0.8 on 03/31/2011   Assessment/Plan:   1. Moderately differentiated adenocarcinoma of the cecum, stage IIIB (T4 N1), status post a right colectomy on 04/08/2011    -the tumor was microsatellite stable and K-ras wild-type  2. History of colon polyps  3. Anemia secondary to iron deficiency and operative blood loss  4. Postoperative abdominal wall/flank hematoma  5. Sleep apnea  6. History of atrial fibrillation  7. History of esophageal strict  8. Family history of colon cancer and gastric cancer   Disposition:   I discussed the diagnosis, prognosis, and adjuvant systemic treatment options with Mr. Heming and his wife. We reviewed the surgical pathology report in detail. He has a significant chance of developing recurrent colon cancer over the next few years. I reviewed the data confirming a benefit from adjuvant 5  fluorouracil-based therapy in patients with resected stage III colon cancer. We also discussed the benefit expected with the addition of oxaliplatin.  We discussed standard FOLFOX and CAPOX chemotherapy. We also discussed the CALGB 09/14/2000 study which randomizes patients to 3 versus 6 months of FOLFOX with a second randomization to placebo or celecoxib. He is interested in enrollment on the clinical trial. He met with a research nurse today and will make a decision on this clinical trial within the next few days. He prefers standard FOLFOX if he does not enroll on a clinical trial. I reviewed the potential toxicities associated with the FOLFOX regimen including a chance for nausea/vomiting, mucositis, diarrhea, alopecia, and hematologic toxicity. We discussed the skin hyperpigmentation and hand/foot syndrome associated with 5-fluorouracil. We reviewed the various types of neuropathy associated with oxaliplatin.  I remain concerned he may have hereditary non-polyposis colon cancer syndrome despite the microsatellite stable tumor. We will make a referral to the genetics counseling service with the plan to initiate additional diagnostic testing.  He will complete a staging evaluation with a CT of the chest. He will be scheduled for a chemotherapy teaching class. We will ask Dr. Abbey Chatters to place a Port-A-Cath for the administration of chemotherapy. A first cycle of FOLFOX has been scheduled for March 25. He will return for an office visit and cycle 2 on April 8.  Divine Imber BRADLEY 04/19/2011, 7:05 PM

## 2011-04-21 ENCOUNTER — Telehealth: Payer: Self-pay | Admitting: *Deleted

## 2011-04-21 NOTE — Telephone Encounter (Signed)
Patient phoned regarding his treatment options and requested to have more information.  He would like to hear about the CapeOx regimen in addition to the FOLFOX regimen during his chemo class.  He stated he would make his treatment decision after that.    A message was left for the chemo education nurse.  Confirmed with the patient that his class is on the 19th of March.  Patient stated he does not want to do anything that will change his day 1 treatment from being on March 25th.  Above information will also be given to Dr. Truett Perna.  Encouraged patient to please call if he has any other questions.  Patient verified understanding and is without further questions at this time.

## 2011-04-22 ENCOUNTER — Ambulatory Visit (HOSPITAL_COMMUNITY)
Admission: RE | Admit: 2011-04-22 | Discharge: 2011-04-22 | Disposition: A | Discharge status: Home / Self Care | Payer: Medicare Other | Source: Ambulatory Visit | Attending: Oncology | Admitting: Oncology

## 2011-04-22 ENCOUNTER — Encounter: Payer: Self-pay | Admitting: *Deleted

## 2011-04-22 ENCOUNTER — Telehealth: Payer: Self-pay | Admitting: Oncology

## 2011-04-22 ENCOUNTER — Ambulatory Visit (INDEPENDENT_AMBULATORY_CARE_PROVIDER_SITE_OTHER): Payer: Medicare Other | Admitting: General Surgery

## 2011-04-22 ENCOUNTER — Encounter (INDEPENDENT_AMBULATORY_CARE_PROVIDER_SITE_OTHER): Payer: Self-pay | Admitting: General Surgery

## 2011-04-22 VITALS — BP 118/68 | HR 66 | Temp 98.4°F | Ht 74.0 in | Wt 165.0 lb

## 2011-04-22 DIAGNOSIS — Z9889 Other specified postprocedural states: Secondary | ICD-10-CM

## 2011-04-22 DIAGNOSIS — Z9221 Personal history of antineoplastic chemotherapy: Secondary | ICD-10-CM | POA: Insufficient documentation

## 2011-04-22 DIAGNOSIS — I2510 Atherosclerotic heart disease of native coronary artery without angina pectoris: Secondary | ICD-10-CM | POA: Insufficient documentation

## 2011-04-22 DIAGNOSIS — C189 Malignant neoplasm of colon, unspecified: Secondary | ICD-10-CM | POA: Insufficient documentation

## 2011-04-22 DIAGNOSIS — M899 Disorder of bone, unspecified: Secondary | ICD-10-CM | POA: Insufficient documentation

## 2011-04-22 DIAGNOSIS — C182 Malignant neoplasm of ascending colon: Secondary | ICD-10-CM

## 2011-04-22 NOTE — Progress Notes (Signed)
Operation: Laparoscopic assisted right colectomy  Date: April 08, 2011  Pathology: Stage III  HPI: [name] is here for his first postoperative visit. He is tolerating his diet. His bowels are moving. He is slowly regaining some strength. He had some postoperative anemia from a left flank hematoma and his hemoglobin has been improving. He is taking iron. He has not restarted his aspirin.   Physical Exam: Gen.-he looks well and in no acute distress.  Abdomen-soft, incision is clean and intact, left flank hematoma is significantly smaller in size.  Assessment: Stage III colon cancer status post laparoscopic-assisted right colectomy-he is due to start chemotherapy March 25 and a Port-A-Cath has been requested.  Plan: Continue light activities. Plan on outpatient Port-A-Cath insertion.  The procedure risks and aftercare been explained. Risks include but are not limited to bleeding, infection, malfunction, pneumothorax, wound problems, DVT.

## 2011-04-22 NOTE — Telephone Encounter (Signed)
called pts home lmovm for appts on 03/19.  asked that pt rtn call to confirm appt

## 2011-04-22 NOTE — Patient Instructions (Signed)
You may eat raw vegetables. Continue activity restrictions. You may drive if you are pain free.

## 2011-04-22 NOTE — Progress Notes (Signed)
RECEIVED A CALL AND FAX FROM [facility], JANE TILLEY, CONCERNING A CT CHEST REPORT. THIS REPORT WAS GIVEN TO DR.SHERRILL.

## 2011-04-23 ENCOUNTER — Telehealth: Payer: Self-pay | Admitting: *Deleted

## 2011-04-23 NOTE — Telephone Encounter (Signed)
Returned phone call to patient.  Per Dr. Truett Perna, "the CT chest from 04/22/11 shows no evidence of metastatic disease in the chest". This was good news to patient as he states he is feeling great and denies any physical complaints.  He has a great appetite and is feeling stronger and ready to start treatment ( once he decides on which treatment he will  take). His questions were also answered regarding his therapy choices of FOLFOX or CapeOx.  Patient was encouraged to call his insurance company regarding coverage.  Patient said he will decide on his treatment after he meets with Dr. Truett Perna again and goes to chemo class.  Patient was appreciative of the information and denies any complaints.  Will continue to follow.

## 2011-04-26 ENCOUNTER — Other Ambulatory Visit (HOSPITAL_BASED_OUTPATIENT_CLINIC_OR_DEPARTMENT_OTHER): Payer: Medicare Other | Admitting: Lab

## 2011-04-26 ENCOUNTER — Ambulatory Visit (HOSPITAL_BASED_OUTPATIENT_CLINIC_OR_DEPARTMENT_OTHER): Payer: Medicare Other | Admitting: Genetic Counselor

## 2011-04-26 DIAGNOSIS — C182 Malignant neoplasm of ascending colon: Secondary | ICD-10-CM

## 2011-04-26 DIAGNOSIS — Z809 Family history of malignant neoplasm, unspecified: Secondary | ICD-10-CM

## 2011-04-26 DIAGNOSIS — C180 Malignant neoplasm of cecum: Secondary | ICD-10-CM

## 2011-04-26 DIAGNOSIS — IMO0002 Reserved for concepts with insufficient information to code with codable children: Secondary | ICD-10-CM

## 2011-04-26 LAB — CBC WITH DIFFERENTIAL/PLATELET
BASO%: 0.4 % (ref 0.0–2.0)
EOS%: 1.1 % (ref 0.0–7.0)
HCT: 34.7 % — ABNORMAL LOW (ref 38.4–49.9)
LYMPH%: 27.5 % (ref 14.0–49.0)
MCH: 29.9 pg (ref 27.2–33.4)
MCHC: 34.1 g/dL (ref 32.0–36.0)
MCV: 87.6 fL (ref 79.3–98.0)
MONO%: 6.9 % (ref 0.0–14.0)
NEUT%: 64.1 % (ref 39.0–75.0)
Platelets: 292 10*3/uL (ref 140–400)
RBC: 3.96 10*6/uL — ABNORMAL LOW (ref 4.20–5.82)
WBC: 4.1 10*3/uL (ref 4.0–10.3)

## 2011-04-26 NOTE — Progress Notes (Signed)
Dr.  Truett Perna requested a consultation for genetic counseling and risk assessment for [name], a 71 y.o. male, for discussion of his personal and family history of colorectal cancer. He presents to clinic today, with his wife, to discuss the possibility of a genetic predisposition to cancer, and to further clarify his risks, as well as his family members' risks for cancer.   HISTORY OF PRESENT ILLNESS: In 2013, at the age of 39, [name] was diagnosed with colon cancer of the ileocecal valve. This was treated with surgery on 04/08/2011.     Past Medical History  Diagnosis Date  . Diverticulosis     Sigmoid colon  . Personal history of colonic polyps 09/17/2009    Tubular adenoma  . History of hemorrhoids   . Hyperlipidemia   . Anemia   . Cancer   . 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 Atrial fibrillation 2006    Dr Donnie Aho  . Sleep apnea 03-31-11    dx. 2-3 yrs ago, uses cap nightly  . GERD (gastroesophageal reflux disease) 03-31-11    tx. Omeprazole    Past Surgical History  Procedure Date  . Inguinal hernia repair   . Cad 2004    Dr Donnie Aho  . Colon surgery 04/08/2011    lap patial colectomy    History  Substance Use Topics  . Smoking status: Former Smoker -- 7 years    Quit date: 03/21/1961  . Smokeless tobacco: Never Used  . Alcohol Use: No    PERSONAL RISK ASSESSMENT FACTORS: Patient reports having three polyps total.  The first in the 1990's, one in 2011 and the last polyp noted on his colonoscopy in 2013 when the cancer was diagnosed.  He has never been diagnosed with any other form of cancer.  FAMILY HISTORY:  We obtained a detailed, 4-generation family history.  Significant diagnoses are listed below: Family History  Problem Relation Age of Onset  . Diabetes Father   . Prostate cancer Father   . Heart attack Father 75  . Heart failure Mother     Congestive heart failure  . Colon cancer Brother   . Stomach cancer Sister   . Coronary  artery disease Brother     CABG  Patient was diagnosed at age 89 with colon cancer.  He has two children, a boy and a girl.  His daughter was diagnosed with an ovarian cyst, that was originally thought to be cancerous, and involved both ovaries.  He has three sisters and three brothers.  One sister was diagnosed with stomach cancer at age 62, and is doing well at age 2.  Another sister was diagnosed with nodules on her lungs that are being followed.  One brother died in a MVA at age 103, and one brother was diagnosed with colon cancer at age 27 and again at age 54.  This brother died at 87 from his cancer. This brother also had a son who was diagnosed with colon cancer in his 36s.  All other siblings, nephews and nieces have not been diagnosed with cancer.  Patients mother died of heart disease at age 52.  She had a brother and sister.  The sister had two daughters who were diagnosed with unknown cancers.  The patient did not report any cancer on his father's side of the family.  Patient's maternal and paternal ancestors are of unknown descent. There is no reported Ashkenazi Jewish ancestry. There is no  known consanguinity.  GENETIC COUNSELING RISK ASSESSMENT, DISCUSSION, AND SUGGESTED FOLLOW UP: We reviewed the natural history and genetic etiology of sporadic, familial and hereditary cancer syndromes.  We discussed that 75% of colon cancers are sporadic, and about 5-10% are hereditary.  We reviewed the red flags of hereditary colon cancer syndromes, and the Amsterdam II criteria that provides a clinical diagnosis of Lynch syndrome.  The patient meets the Amsterdam criteria. The patient's tumor was tested for microsattellite instability and was found to be stable (MSS).  However, because the patient meets Amsterdam criteria, he should still consider testing.  The patient's personal and family history is suggestive of the following possible diagnosis: Lynch syndrome.  We discussed that identification of  a hereditary cancer syndrome may help his care providers tailor the patients medical management. If a mutation indicating Lynch syndrome is detected in this case, the [employer] recommendations would include increased surveillance for the related cancers. If a mutation is detected, the patient will be referred back to the referring provider and to any additional appropriate care providers to discuss the relevant options.   If a mutation is not found in the patient, this will decrease the likelihood of Lynch syndrome as the explanation for his colon cancer. Cancer surveillance options would be discussed for the patient according to the appropriate standard National Comprehensive Cancer Network and American Cancer Society guidelines, with consideration of their personal and family history risk factors. In this case, the patient will be referred back to their care providers for discussions of management.   In order to estimate his chance of having a MLH1, MSH2 or MSH6 mutation, we used statistical models [employer]ista) and laboratory data that take into account his personal medical history, family history and ancestry.  Because each model is different, there can be a lot of variability in the risks they give.  Therefore, these numbers must be considered a rough range and not a precise risk of having a MLH1, MSH2 or MSH6 mutation.  These models estimate that she has approximately a 23% chance of having a mutation. Based on this assessment of her family and personal history, genetic testing is recommended.  After considering the risks, benefits, and limitations, the patient provided informed consent for  the following  testing:  Lynch Syndrome, PMS2 and Cancer Next through [employer]   Per the patient's request, we will contact him by telephone to discuss these results. A follow up genetic counseling visit will be scheduled if indicated.  The patient was seen for a total of  60 minutes, greater than 50% of which was spent face-to-face counseling.  This plan is being carried out per Dr. Nolon Rod recommendations.  This note will also be sent to the referring provider via the electronic medical record. The patient will be supplied with a summary of this genetic counseling discussion as well as educational information on the discussed hereditary cancer syndromes following the conclusion of their visit.   Patient was discussed with Dr. Drue Second.  EPIC CC: Dr. Larena Sox   EDUCATIONAL INFORMATION SUPPLIED TO PATIENT AT ENCOUNTER:  Lynch Syndrome Brochure   _______________________________________________________________________ For Office Staff:  Number of people involved in session: 3 Was an Intern/ student involved with case: not applicable

## 2011-04-27 ENCOUNTER — Encounter (HOSPITAL_COMMUNITY): Payer: Self-pay

## 2011-04-27 ENCOUNTER — Ambulatory Visit (HOSPITAL_BASED_OUTPATIENT_CLINIC_OR_DEPARTMENT_OTHER): Payer: Medicare Other | Admitting: Oncology

## 2011-04-27 ENCOUNTER — Encounter: Payer: Self-pay | Admitting: *Deleted

## 2011-04-27 ENCOUNTER — Encounter (HOSPITAL_COMMUNITY)
Admission: RE | Admit: 2011-04-27 | Discharge: 2011-04-27 | Disposition: A | Discharge status: Home / Self Care | Payer: Medicare Other | Source: Ambulatory Visit | Attending: General Surgery | Admitting: General Surgery

## 2011-04-27 ENCOUNTER — Other Ambulatory Visit: Payer: Medicare Other

## 2011-04-27 ENCOUNTER — Other Ambulatory Visit: Payer: Self-pay | Admitting: *Deleted

## 2011-04-27 ENCOUNTER — Telehealth: Payer: Self-pay | Admitting: *Deleted

## 2011-04-27 VITALS — BP 126/60 | HR 84 | Temp 97.1°F | Ht 74.0 in | Wt 166.9 lb

## 2011-04-27 DIAGNOSIS — C182 Malignant neoplasm of ascending colon: Secondary | ICD-10-CM

## 2011-04-27 DIAGNOSIS — C180 Malignant neoplasm of cecum: Secondary | ICD-10-CM

## 2011-04-27 LAB — BASIC METABOLIC PANEL
Calcium: 9.2 mg/dL (ref 8.4–10.5)
Creatinine, Ser: 1 mg/dL (ref 0.50–1.35)
GFR calc non Af Amer: 75 mL/min — ABNORMAL LOW (ref >90)
Sodium: 139 mEq/L (ref 135–145)

## 2011-04-27 LAB — CBC
MCH: 29 pg (ref 26.0–34.0)
Platelets: 274 10*3/uL (ref 150–400)
RBC: 3.9 MIL/uL — ABNORMAL LOW (ref 4.22–5.81)
RDW: 16.5 % — ABNORMAL HIGH (ref 11.5–15.5)
WBC: 4.7 10*3/uL (ref 4.0–10.5)

## 2011-04-27 LAB — SURGICAL PCR SCREEN: Staphylococcus aureus: NEGATIVE

## 2011-04-27 MED ORDER — CAPECITABINE 500 MG PO TABS: 2000.0000 mg | ORAL_TABLET | Freq: Two times a day (BID) | ORAL | Status: DC

## 2011-04-27 MED ORDER — LIDOCAINE-PRILOCAINE 2.5-2.5 % EX CREA: TOPICAL_CREAM | CUTANEOUS | Status: DC | PRN

## 2011-04-27 MED ORDER — PROCHLORPERAZINE MALEATE 10 MG PO TABS: 10.0000 mg | ORAL_TABLET | Freq: Four times a day (QID) | ORAL | Status: DC | PRN

## 2011-04-27 NOTE — Pre-Procedure Instructions (Signed)
21 [name]  04/27/2011   Your procedure is scheduled on: MARCH 22 ND, Friday   Report to [facility] at 7:30 AM.   Call this number if you have problems the morning of surgery: [phone]   Remember:   Do not eat food:After Midnight Thursday .  May have clear liquids: up to 4 Hours before arrival TIME 3:30.AM  Clear liquids include soda, tea, black coffee, apple or grape juice, broth.   Take these medicines the morning of surgery with A SIP OF WATER: METOPROLOL              OMEPRAZOLE   Do not wear jewelry, make-up or nail polish.   Do not wear lotions, powders, or perfumes. You may wear deodorant.  Do not bring valuables to the hospital.   Contacts, dentures or bridgework may not be worn into surgery.  Leave suitcase in the car. After surgery it may be brought to your room.  For patients admitted to the hospital, checkout time is 11:00 AM the day of discharge.   Patients discharged the day of surgery will not be allowed to drive home.  Name and phone number of your driver: [name]---WIFE                                                               Special Instructions: CHG Shower Use Special Wash: 1/2 bottle night before surgery and 1/2 bottle morning of surgery.   Please read over the following fact sheets that you were given: Pain Booklet, Surgical Site Infection Prevention and Anesthesia Post-op Instructions

## 2011-04-27 NOTE — Progress Notes (Signed)
1510  Tuesday....Marland KitchenREQUESTED RECORDS/ RECENT OFFICE VISIT FROM DR [facility] OFFICE.Marland Kitchen ALSO REQUESTED RECORDS OF HIS SLEEP STUDY DONE IN 2010 FROM DR Anne Hahn' OFFICE

## 2011-04-27 NOTE — Telephone Encounter (Signed)
Received phone call from mr. Champine stating that he chosen to go with the CapeOx regimen so that he can receive the oral form, Xeloda, instead of the 5FU pump.  This information was given to Dr. Kalman Drape nurse, Darl Pikes, who has already sent his information to [facility].  The toll free number was given to the patient and he was encouraged to call them if he has not heard from them in a day or so.  He verbalized comprehension and was without further questions.

## 2011-04-27 NOTE — Progress Notes (Signed)
OFFICE PROGRESS NOTE   INTERVAL HISTORY:   He returns as scheduled. He feels well. No complaint.  The microsatellite instability testing return with a microsatellite stable genotype.  A CT of the chest was negative for metastatic disease.  He met with a research nurse to discuss the CALGB 09/14/2000 study. He is not comfortable with the randomization to 3 versus 6 months of adjuvant therapy.  Objective:  Vital signs in last 24 hours:  Blood pressure 126/60, pulse 84, temperature 97.1 F (36.2 C), temperature source Oral, height 6\' 2"  (1.88 m), weight 166 lb 14.4 oz (75.705 kg).   Physical exam not performed today.  Lab Results:  Lab Results  Component Value Date   WBC 4.1 04/26/2011   HGB 11.8* 04/26/2011   HCT 34.7* 04/26/2011   MCV 87.6 04/26/2011   PLT 292 04/26/2011      Medications: I have reviewed the patient's current medications.  Assessment/Plan: 1. Moderately differentiated adenocarcinoma of the cecum, stage IIIB (T4 N1), status post a right colectomy on 04/08/2011 -the tumor was microsatellite stable and K-ras wild-type  2. History of colon polyps  3. Anemia secondary to iron deficiency and operative blood loss  4. Postoperative abdominal wall/flank hematoma  5. Sleep apnea  6. History of atrial fibrillation  7. History of esophageal strict  8. Family history of colon cancer and gastric cancer    Disposition:  We discussed adjuvant treatment options again today. He does not wish to when role on the CALGB study. We reviewed the specifics of the FOLFOX and CAPOX chemotherapy regimens. A decision was made to proceed with adjuvant CAPOX chemotherapy. He will attend a chemotherapy teaching class today. He is scheduled for placement of a Port-A-Cath on 04/30/2011. A first cycle of CAPOX is scheduled for 05/03/2011. He will return for an office visit as scheduled 05/17/2011.   Lucile Shutters, MD  04/27/2011  9:51 AM

## 2011-04-29 MED ORDER — CEFAZOLIN SODIUM 1-5 GM-% IV SOLN
1.0000 g | INTRAVENOUS | Status: AC
  Administered 2011-04-30: 1 g via INTRAVENOUS
  Filled 2011-04-29: qty 50

## 2011-04-30 ENCOUNTER — Ambulatory Visit (HOSPITAL_COMMUNITY): Payer: Medicare Other

## 2011-04-30 ENCOUNTER — Ambulatory Visit (HOSPITAL_COMMUNITY)
Admission: RE | Admit: 2011-04-30 | Discharge: 2011-04-30 | Disposition: A | Discharge status: Home / Self Care | Payer: Medicare Other | Source: Ambulatory Visit | Attending: General Surgery | Admitting: General Surgery

## 2011-04-30 ENCOUNTER — Encounter (HOSPITAL_COMMUNITY): Payer: Self-pay

## 2011-04-30 ENCOUNTER — Encounter: Payer: Self-pay | Admitting: *Deleted

## 2011-04-30 ENCOUNTER — Encounter (HOSPITAL_COMMUNITY): Payer: Self-pay | Admitting: Anesthesiology

## 2011-04-30 ENCOUNTER — Ambulatory Visit (HOSPITAL_COMMUNITY): Payer: Medicare Other | Admitting: Anesthesiology

## 2011-04-30 ENCOUNTER — Encounter (HOSPITAL_COMMUNITY)
Admission: RE | Disposition: A | Discharge status: Home / Self Care | Payer: Self-pay | Source: Ambulatory Visit | Attending: General Surgery

## 2011-04-30 DIAGNOSIS — G4730 Sleep apnea, unspecified: Secondary | ICD-10-CM | POA: Insufficient documentation

## 2011-04-30 DIAGNOSIS — C189 Malignant neoplasm of colon, unspecified: Secondary | ICD-10-CM

## 2011-04-30 DIAGNOSIS — Z01812 Encounter for preprocedural laboratory examination: Secondary | ICD-10-CM | POA: Insufficient documentation

## 2011-04-30 DIAGNOSIS — K219 Gastro-esophageal reflux disease without esophagitis: Secondary | ICD-10-CM | POA: Insufficient documentation

## 2011-04-30 HISTORY — PX: PORTACATH PLACEMENT: SHX2246

## 2011-04-30 SURGERY — INSERTION, TUNNELED CENTRAL VENOUS DEVICE, WITH PORT
Anesthesia: General | Site: Chest | Laterality: Right | Wound class: Clean

## 2011-04-30 MED ORDER — MIDAZOLAM HCL 5 MG/5ML IJ SOLN
INTRAMUSCULAR | Status: DC | PRN
  Administered 2011-04-30: 1 mg via INTRAVENOUS

## 2011-04-30 MED ORDER — SODIUM CHLORIDE 0.9 % IJ SOLN: 3.0000 mL | INTRAMUSCULAR | Status: DC | PRN

## 2011-04-30 MED ORDER — 0.9 % SODIUM CHLORIDE (POUR BTL) OPTIME
TOPICAL | Status: DC | PRN
  Administered 2011-04-30: 1000 mL

## 2011-04-30 MED ORDER — HEPARIN SOD (PORK) LOCK FLUSH 100 UNIT/ML IV SOLN
INTRAVENOUS | Status: DC | PRN
  Administered 2011-04-30: 500 [IU] via INTRAVENOUS

## 2011-04-30 MED ORDER — LACTATED RINGERS IV SOLN
INTRAVENOUS | Status: DC | PRN
  Administered 2011-04-30: 10:00:00 via INTRAVENOUS

## 2011-04-30 MED ORDER — LACTATED RINGERS IV SOLN
INTRAVENOUS | Status: DC
  Administered 2011-04-30: 09:00:00 via INTRAVENOUS

## 2011-04-30 MED ORDER — FENTANYL CITRATE 0.05 MG/ML IJ SOLN
INTRAMUSCULAR | Status: DC | PRN
  Administered 2011-04-30 (×3): 25 ug via INTRAVENOUS
  Administered 2011-04-30: 100 ug via INTRAVENOUS

## 2011-04-30 MED ORDER — OXYCODONE HCL 5 MG PO TABS: 5.0000 mg | ORAL_TABLET | ORAL | Status: DC | PRN

## 2011-04-30 MED ORDER — LIDOCAINE HCL (PF) 1 % IJ SOLN
INTRAMUSCULAR | Status: DC | PRN
  Administered 2011-04-30: 11 mL

## 2011-04-30 MED ORDER — ONDANSETRON HCL 4 MG/2ML IJ SOLN: 4.0000 mg | Freq: Four times a day (QID) | INTRAMUSCULAR | Status: DC | PRN

## 2011-04-30 MED ORDER — FENTANYL CITRATE 0.05 MG/ML IJ SOLN: 25.0000 ug | INTRAMUSCULAR | Status: DC | PRN

## 2011-04-30 MED ORDER — SODIUM CHLORIDE 0.9 % IR SOLN
Status: DC | PRN
  Administered 2011-04-30: 10:00:00

## 2011-04-30 MED ORDER — ONDANSETRON HCL 4 MG/2ML IJ SOLN
INTRAMUSCULAR | Status: DC | PRN
  Administered 2011-04-30: 4 mg via INTRAVENOUS

## 2011-04-30 MED ORDER — ACETAMINOPHEN 650 MG RE SUPP: 650.0000 mg | RECTAL | Status: DC | PRN

## 2011-04-30 MED ORDER — PROPOFOL 10 MG/ML IV BOLUS
INTRAVENOUS | Status: DC | PRN
  Administered 2011-04-30: 200 mg via INTRAVENOUS

## 2011-04-30 MED ORDER — ACETAMINOPHEN 325 MG PO TABS: 650.0000 mg | ORAL_TABLET | ORAL | Status: DC | PRN

## 2011-04-30 SURGICAL SUPPLY — 57 items
BAG DECANTER FOR FLEXI CONT (MISCELLANEOUS) ×2 IMPLANT
BENZOIN TINCTURE PRP APPL 2/3 (GAUZE/BANDAGES/DRESSINGS) ×2 IMPLANT
BLADE SURG 11 STRL SS (BLADE) ×2 IMPLANT
BLADE SURG 15 STRL LF DISP TIS (BLADE) ×1 IMPLANT
BLADE SURG 15 STRL SS (BLADE) ×1
CHLORAPREP W/TINT 26ML (MISCELLANEOUS) ×2 IMPLANT
CLOTH BEACON ORANGE TIMEOUT ST (SAFETY) ×2 IMPLANT
COVER PROBE W GEL 5X96 (DRAPES) ×2 IMPLANT
COVER SURGICAL LIGHT HANDLE (MISCELLANEOUS) ×2 IMPLANT
CRADLE DONUT ADULT HEAD (MISCELLANEOUS) ×2 IMPLANT
DECANTER SPIKE VIAL GLASS SM (MISCELLANEOUS) IMPLANT
DRAPE C-ARM 42X72 X-RAY (DRAPES) ×2 IMPLANT
DRAPE LAPAROSCOPIC ABDOMINAL (DRAPES) ×2 IMPLANT
DRAPE UTILITY 15X26 W/TAPE STR (DRAPE) ×8 IMPLANT
DRSG OPSITE 4X5.5 SM (GAUZE/BANDAGES/DRESSINGS) ×2 IMPLANT
ELECT CAUTERY BLADE 6.4 (BLADE) ×2 IMPLANT
ELECT REM PT RETURN 9FT ADLT (ELECTROSURGICAL) ×2
ELECTRODE REM PT RTRN 9FT ADLT (ELECTROSURGICAL) ×1 IMPLANT
GAUZE SPONGE 2X2 8PLY STRL LF (GAUZE/BANDAGES/DRESSINGS) ×1 IMPLANT
GAUZE SPONGE 4X4 16PLY XRAY LF (GAUZE/BANDAGES/DRESSINGS) ×2 IMPLANT
GLOVE BIOGEL PI IND STRL 7.0 (GLOVE) ×1 IMPLANT
GLOVE BIOGEL PI IND STRL 8 (GLOVE) ×1 IMPLANT
GLOVE BIOGEL PI INDICATOR 7.0 (GLOVE) ×1
GLOVE BIOGEL PI INDICATOR 8 (GLOVE) ×1
GLOVE ECLIPSE 8.0 STRL XLNG CF (GLOVE) ×2 IMPLANT
GLOVE SURG SS PI 6.5 STRL IVOR (GLOVE) ×2 IMPLANT
GOWN STRL NON-REIN LRG LVL3 (GOWN DISPOSABLE) ×4 IMPLANT
INTRODUCER 13FR (MISCELLANEOUS) IMPLANT
INTRODUCER COOK 11FR (CATHETERS) IMPLANT
KIT BASIN OR (CUSTOM PROCEDURE TRAY) ×2 IMPLANT
KIT PORT POWER 9.6FR MRI PREA (Catheter) IMPLANT
KIT PORT POWER ISP 8FR (Catheter) IMPLANT
KIT POWER CATH 8FR (Catheter) ×2 IMPLANT
KIT ROOM TURNOVER OR (KITS) ×2 IMPLANT
NEEDLE 22X1 1/2 (OR ONLY) (NEEDLE) ×2 IMPLANT
NEEDLE HYPO 25GX1X1/2 BEV (NEEDLE) ×2 IMPLANT
NS IRRIG 1000ML POUR BTL (IV SOLUTION) ×2 IMPLANT
PACK SURGICAL SETUP 50X90 (CUSTOM PROCEDURE TRAY) ×2 IMPLANT
PAD ARMBOARD 7.5X6 YLW CONV (MISCELLANEOUS) ×4 IMPLANT
PENCIL BUTTON HOLSTER BLD 10FT (ELECTRODE) ×2 IMPLANT
SET INTRODUCER 12FR PACEMAKER (SHEATH) IMPLANT
SET SHEATH INTRODUCER 10FR (MISCELLANEOUS) IMPLANT
SHEATH COOK PEEL AWAY SET 9F (SHEATH) IMPLANT
SPONGE GAUZE 2X2 STER 10/PKG (GAUZE/BANDAGES/DRESSINGS) ×1
STRIP CLOSURE SKIN 1/4X4 (GAUZE/BANDAGES/DRESSINGS) ×2 IMPLANT
SUT MON AB 4-0 PC3 18 (SUTURE) ×2 IMPLANT
SUT VIC AB 2-0 SH 18 (SUTURE) ×2 IMPLANT
SUT VIC AB 3-0 54X BRD REEL (SUTURE) IMPLANT
SUT VIC AB 3-0 BRD 54 (SUTURE)
SUT VIC AB 3-0 SH 27 (SUTURE)
SUT VIC AB 3-0 SH 27X BRD (SUTURE) IMPLANT
SYR 20ML ECCENTRIC (SYRINGE) ×4 IMPLANT
SYR 5ML LUER SLIP (SYRINGE) ×2 IMPLANT
SYR CONTROL 10ML LL (SYRINGE) ×2 IMPLANT
TOWEL OR 17X24 6PK STRL BLUE (TOWEL DISPOSABLE) ×2 IMPLANT
TOWEL OR 17X26 10 PK STRL BLUE (TOWEL DISPOSABLE) ×2 IMPLANT
WATER STERILE IRR 1000ML POUR (IV SOLUTION) IMPLANT

## 2011-04-30 NOTE — Transfer of Care (Signed)
Immediate Anesthesia Transfer of Care Note  Patient: [name]  Procedure(s) Performed: Procedure(s) (LRB): INSERTION PORT-A-CATH (Right)  Patient Location: PACU  Anesthesia Type: General  Level of Consciousness: awake, alert  and oriented  Airway & Oxygen Therapy: Patient Spontanous Breathing and Patient connected to nasal cannula oxygen  Post-op Assessment: Report given to PACU RN and Post -op Vital signs reviewed and stable  Post vital signs: Reviewed and stable  Complications: No apparent anesthesia complications

## 2011-04-30 NOTE — Op Note (Signed)
Preoperative diagnosis:   Stage III colon cancer  Postoperative diagnosis:  Same  Procedure: Ultrasound-guided Port-A-Cath insertion into right internal jugular vein under fluoroscopy.  Surgeon: Avel Peace M.D.  Anesthesia: Local (Xylocaine) with MAC.  Indication:  This is a 71 year old male with stage III colon cancer who needs long term access for chemotherapy.  He presents for Port-a-cath insertion.  Technique: The patient was brought to the operating room, placed supine on the operating table, and intravenous sedation was given. A roll was placed under the back and the arms were tucked. Hair on the chest wall was clipped as was necessary. The upper chest wall and neck were sterilely prepped and draped.  The patient's head was rotated to the left. Using the ultrasound, the right internal jugular vein was identified. Local anesthetic was infiltrated in the skin and subcutaneous tissue just anterior to it. A 16-gauge needle was used to cannulate the right internal jugular vein under ultrasound guidance. A wire was then threaded through the needle into the internal jugular vein and down into the right heart under ultrasound and fluoroscopic guidance.  Local anesthetic was infiltrated into the right upper chest wall. A right upper chest wall incision was made and a pocket was created for the Portacath.  An incision was made around the wire in the neck. The catheter was then tunneled from the chest wall incision up through the neck incision.  A dilator- introducer complex was placed over the wire into the superior vena cava. The dilator and wire were then removed and the catheter was threaded through the peel-away sheath introducer into the right heart. The introducer was then peeled away and removed. Under fluoroscopic guidance, the tip of the catheter was then pulled back until it was at the junction of the superior vena cava and right atrium. The catheter was then connected to the port.  The  port aspirated blood and flushed easily.  The port was then anchored to the chest wall with 2-0 Vicryl suture. Concentrated heparin solution was then placed into the port. The port and catheter position were then verified using fluoroscopy. The subcutaneous tissue was then closed over the port with running 3-0 Vicryl suture. The skin incisions were then closed with 4-0 Monocryl subcuticular stitches. Steri-Strips and sterile dressings were applied.  The procedure was well-tolerated without any apparent complications. The patient was taken to the recovery room in satisfactory condition where a portable chest x-ray is pending.

## 2011-04-30 NOTE — H&P (Signed)
[name] is an 71 y.o. male.   Chief Complaint:   Here for Portacath insertion HPI:   71 year old male with stage III colon cancer.  He is here for Port-a-cath insertion so he can start chemotherapy.  Past Medical History  Diagnosis Date  . Diverticulosis     Sigmoid colon  . Personal history of colonic polyps 09/17/2009    Tubular adenoma  . History of hemorrhoids   . Hyperlipidemia   . Anemia   . Cancer   . 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 Atrial fibrillation 2006    Dr Donnie Aho  . Sleep apnea 03-31-11    dx. 2-3 yrs ago, uses cap nightly  . GERD (gastroesophageal reflux disease) 03-31-11    tx. Omeprazole  . Sleep apnea     dx 2010       Past Surgical History  Procedure Date  . Inguinal hernia repair   . Cad 2004    Dr Donnie Aho  . Colon surgery 04/08/2011    lap patial colectomy  . Cardiac catheterization     around 2004  by Dr Donnie Aho    Family History  Problem Relation Age of Onset  . Diabetes Father   . Prostate cancer Father   . Heart attack Father 23  . Heart failure Mother     Congestive heart failure  . Colon cancer Brother   . Stomach cancer Sister   . Coronary artery disease Brother     CABG  . Anesthesia problems Neg Hx    Social History:  reports that he quit smoking about 50 years ago. He has never used smokeless tobacco. He reports that he does not drink alcohol or use illicit drugs.  Allergies: No Known Allergies  Medications Prior to Admission  Medication Dose Route Frequency Provider Last Rate Last Dose  . ceFAZolin (ANCEF) IVPB 1 g/50 mL premix  1 g Intravenous 60 min Pre-Op Adolph Pollack, MD      . lactated ringers infusion   Intravenous Continuous Remonia Richter, MD 50 mL/hr at 04/30/11 1610     Medications Prior to Admission  Medication Sig Dispense Refill  . ferrous sulfate 325 (65 FE) MG tablet Take 1 tablet (325 mg total) by mouth 3 (three) times daily with meals.  90 tablet  3  . metoprolol tartrate (LOPRESSOR) 25  MG tablet Take 1 tablet (25 mg total) by mouth 2 (two) times daily.  28 tablet  0  . omeprazole (PRILOSEC) 10 MG capsule Take 1 capsule (10 mg total) by mouth 2 (two) times daily.  180 capsule  3  . oxyCODONE-acetaminophen (PERCOCET) 5-325 MG per tablet       . simvastatin (ZOCOR) 20 MG tablet Take 1 tablet (20 mg total) by mouth at bedtime.  100 tablet  3  . thiamine (VITAMIN B-1) 100 MG tablet Take 100 mg by mouth daily.        Marland Kitchen amphetamine-dextroamphetamine (ADDERALL) 10 MG tablet Take 20 mg by mouth daily.       . fish oil-omega-3 fatty acids 1000 MG capsule Take 1 g by mouth daily.        . Garlic (ODORLESS GARLIC) 300 MG CAPS Take 300 capsules by mouth daily.       . niacin 500 MG tablet Take 1,000 mg by mouth daily with breakfast.       . oxyCODONE-acetaminophen (PERCOCET) 5-325 MG per tablet Take 1-2 tablets by mouth every 4 (four)  hours as needed.  30 tablet  0  . saw palmetto 160 MG capsule Take 160 mg by mouth daily.        . sildenafil (VIAGRA) 100 MG tablet Take 1 tablet (100 mg total) by mouth daily as needed.  6 tablet  11    No results found for this or any previous visit (from the past 48 hour(s)). No results found.  Review of Systems  Constitutional: Negative.   Respiratory: Negative.   Cardiovascular: Negative.     Blood pressure 117/51, pulse 69, temperature 97.5 F (36.4 C), temperature source Oral, resp. rate 18, SpO2 100.00%. Physical Exam  Constitutional: He appears well-developed and well-nourished. No distress.  Neck: Neck supple.  Cardiovascular: Normal rate and regular rhythm.   Respiratory: Effort normal and breath sounds normal.  GI: Soft.       Incisons are clean, dry, and intact.  Lymphadenopathy:    He has no cervical adenopathy.  Skin: Skin is warm and dry.     Assessment/Plan    Stage III colon cancer-needs long term access for chemotherapy.  Plan:  Port-a-cath insertion.  The procedure, risks, and aftercare have been explained to  him.  Sullivan Jacuinde J 04/30/2011, 9:20 AM

## 2011-04-30 NOTE — Anesthesia Preprocedure Evaluation (Signed)
Anesthesia Evaluation  Patient identified by MRN, date of birth, ID band Patient awake    Reviewed: Allergy & Precautions, H&P , NPO status , Patient's Chart, lab work & pertinent test results, reviewed documented beta blocker date and time   History of Anesthesia Complications Negative for: history of anesthetic complications  Airway Mallampati: I TM Distance: >3 FB Neck ROM: Full    Dental No notable dental hx. (+) Teeth Intact and Dental Advisory Given   Pulmonary sleep apnea and Continuous Positive Airway Pressure Ventilation , former smoker breath sounds clear to auscultation  Pulmonary exam normal       Cardiovascular hypertension, Pt. on medications and Pt. on home beta blockers + CAD (non obstructive ASCAD  by cath '04) + dysrhythmias (paroxysmal AF, has only occured on one occasion) Atrial Fibrillation Rhythm:Regular Rate:Normal  cardiolite: 8/12 no ischemia, normal EF   Neuro/Psych negative psych ROS   GI/Hepatic Neg liver ROS, GERD-  Medicated and Controlled,Colon ca   Endo/Other  negative endocrine ROS  Renal/GU negative Renal ROS     Musculoskeletal   Abdominal   Peds  Hematology negative hematology ROS (+)   Anesthesia Other Findings   Reproductive/Obstetrics                           Anesthesia Physical Anesthesia Plan  ASA: III  Anesthesia Plan: General   Post-op Pain Management:    Induction: Intravenous  Airway Management Planned: LMA  Additional Equipment:   Intra-op Plan:   Post-operative Plan:   Informed Consent: I have reviewed the patients History and Physical, chart, labs and discussed the procedure including the risks, benefits and alternatives for the proposed anesthesia with the patient or authorized representative who has indicated his/her understanding and acceptance.   Dental advisory given  Plan Discussed with: CRNA and Surgeon  Anesthesia Plan  Comments: (Plan routine monitors, GA- LMA OK)        Anesthesia Quick Evaluation

## 2011-04-30 NOTE — Anesthesia Postprocedure Evaluation (Signed)
  Anesthesia Post-op Note  Patient: [name]  Procedure(s) Performed: Procedure(s) (LRB): INSERTION PORT-A-CATH (Right)  Patient Location: PACU  Anesthesia Type: General  Level of Consciousness: awake, alert  and oriented  Airway and Oxygen Therapy: Patient Spontanous Breathing  Post-op Pain: none  Post-op Assessment: Post-op Vital signs reviewed, Patient's Cardiovascular Status Stable, Respiratory Function Stable, Patent Airway, No signs of Nausea or vomiting and Pain level controlled  Post-op Vital Signs: Reviewed and stable  Complications: No apparent anesthesia complications

## 2011-04-30 NOTE — Preoperative (Signed)
Beta Blockers   Reason not to administer Beta Blockers:Not Applicable 

## 2011-04-30 NOTE — Discharge Instructions (Addendum)
Rest today.  No heavy lifting with right arm with one week.  Remove bandage Sunday.  May get area wet Monday night.  Diet of choice today.  Call for high fever (T>101.5), heavy bleeding or other wound problems, persistent shortness of breath.   Return visit with Dr. Abbey Chatters in 3 weeks.  Call to make appointment, [phone].

## 2011-04-30 NOTE — Progress Notes (Signed)
Received fax from [facility] that pt's rx has been shipped on 04/29/11.  SLJ 

## 2011-04-30 NOTE — Anesthesia Procedure Notes (Signed)
Procedure Name: LMA Insertion Date/Time: 04/30/2011 9:49 AM Performed by: Delbert Harness Pre-anesthesia Checklist: Patient identified, Timeout performed, Emergency Drugs available, Suction available and Patient being monitored Patient Re-evaluated:Patient Re-evaluated prior to inductionOxygen Delivery Method: Circle system utilized Preoxygenation: Pre-oxygenation with 100% oxygen Intubation Type: IV induction LMA: LMA inserted LMA Size: 4.0 Number of attempts: 1 Placement Confirmation: positive ETCO2 and breath sounds checked- equal and bilateral Tube secured with: Tape Dental Injury: Teeth and Oropharynx as per pre-operative assessment

## 2011-05-02 ENCOUNTER — Other Ambulatory Visit: Payer: Self-pay | Admitting: Oncology

## 2011-05-03 ENCOUNTER — Other Ambulatory Visit: Payer: Self-pay | Admitting: *Deleted

## 2011-05-03 ENCOUNTER — Ambulatory Visit (HOSPITAL_BASED_OUTPATIENT_CLINIC_OR_DEPARTMENT_OTHER): Payer: Medicare Other

## 2011-05-03 ENCOUNTER — Telehealth: Payer: Self-pay | Admitting: *Deleted

## 2011-05-03 ENCOUNTER — Encounter (HOSPITAL_COMMUNITY): Payer: Self-pay | Admitting: General Surgery

## 2011-05-03 ENCOUNTER — Other Ambulatory Visit: Payer: Medicare Other | Admitting: Lab

## 2011-05-03 VITALS — BP 108/41 | HR 76 | Resp 14

## 2011-05-03 DIAGNOSIS — H908 Mixed conductive and sensorineural hearing loss, unspecified: Secondary | ICD-10-CM

## 2011-05-03 DIAGNOSIS — C182 Malignant neoplasm of ascending colon: Secondary | ICD-10-CM

## 2011-05-03 DIAGNOSIS — Z5111 Encounter for antineoplastic chemotherapy: Secondary | ICD-10-CM

## 2011-05-03 LAB — CBC WITH DIFFERENTIAL/PLATELET
BASO%: 0.7 % (ref 0.0–2.0)
EOS%: 4.7 % (ref 0.0–7.0)
LYMPH%: 30.3 % (ref 14.0–49.0)
MCHC: 34.5 g/dL (ref 32.0–36.0)
MONO#: 0.4 10*3/uL (ref 0.1–0.9)
RBC: 4.28 10*6/uL (ref 4.20–5.82)
WBC: 4 10*3/uL (ref 4.0–10.3)
lymph#: 1.2 10*3/uL (ref 0.9–3.3)

## 2011-05-03 LAB — COMPREHENSIVE METABOLIC PANEL
ALT: 11 U/L (ref 0–53)
AST: 13 U/L (ref 0–37)
CO2: 27 mEq/L (ref 19–32)
Chloride: 104 mEq/L (ref 96–112)
Sodium: 138 mEq/L (ref 135–145)
Total Bilirubin: 0.5 mg/dL (ref 0.3–1.2)
Total Protein: 6.5 g/dL (ref 6.0–8.3)

## 2011-05-03 MED ORDER — DEXTROSE 5 % IV SOLN
Freq: Once | INTRAVENOUS | Status: AC
  Administered 2011-05-03: 09:00:00 via INTRAVENOUS

## 2011-05-03 MED ORDER — DEXAMETHASONE SODIUM PHOSPHATE 10 MG/ML IJ SOLN
10.0000 mg | Freq: Once | INTRAMUSCULAR | Status: AC
  Administered 2011-05-03: 10 mg via INTRAVENOUS

## 2011-05-03 MED ORDER — OXALIPLATIN CHEMO INJECTION 100 MG/20ML
130.0000 mg/m2 | Freq: Once | INTRAVENOUS | Status: AC
  Administered 2011-05-03: 260 mg via INTRAVENOUS
  Filled 2011-05-03: qty 52

## 2011-05-03 MED ORDER — ONDANSETRON 8 MG/50ML IVPB (CHCC)
8.0000 mg | Freq: Once | INTRAVENOUS | Status: AC
  Administered 2011-05-03: 8 mg via INTRAVENOUS

## 2011-05-03 NOTE — Patient Instructions (Signed)
[facility] Discharge Instructions for Patients Receiving Chemotherapy  Today you received the following chemotherapy agents: Oxaliplatin.  To help prevent nausea and vomiting after your treatment, we encourage you to take your nausea medication as prescribed by your physician.   If you develop nausea and vomiting that is not controlled by your nausea medication, call the clinic. If it is after clinic hours your family physician or the after hours number for the clinic or go to the Emergency Department.   BELOW ARE SYMPTOMS THAT SHOULD BE REPORTED IMMEDIATELY:  *FEVER GREATER THAN 100.5 F  *CHILLS WITH OR WITHOUT FEVER  NAUSEA AND VOMITING THAT IS NOT CONTROLLED WITH YOUR NAUSEA MEDICATION  *UNUSUAL SHORTNESS OF BREATH  *UNUSUAL BRUISING OR BLEEDING  TENDERNESS IN MOUTH AND THROAT WITH OR WITHOUT PRESENCE OF ULCERS  *URINARY PROBLEMS  *BOWEL PROBLEMS  UNUSUAL RASH Items with * indicate a potential emergency and should be followed up as soon as possible.  One of the nurses will contact you 24 hours after your treatment. Please let the nurse know about any problems that you may have experienced. Feel free to call the clinic you have any questions or concerns. The clinic phone number is [phone].

## 2011-05-04 ENCOUNTER — Telehealth: Payer: Self-pay

## 2011-05-04 ENCOUNTER — Encounter: Payer: Self-pay | Admitting: Genetic Counselor

## 2011-05-04 NOTE — Telephone Encounter (Signed)
Message copied by Abby Potash on Tue May 04, 2011 11:42 AM ------      Message from: Dorothea Glassman D      Created: Mon May 03, 2011 11:56 AM       Pt first time oxaliplatin. Dr. Nolon Rod patient. Phone number:[phone]

## 2011-05-04 NOTE — Telephone Encounter (Signed)
Call the patient to let him know about his nutrition appt.

## 2011-05-04 NOTE — Telephone Encounter (Signed)
Called pt re: chemo f/u call.  Pt states he did experience some cold sensitivity when drinking some room temperature water that was apparently not warm enough, and felt like "ice chips hitting his gums."  Pt states this feeling did go away, and he warmed his water some more.  Pt states his stomach has felt "unsettled," but he denies vomiting.  Pt states he had not taken his compazine, but was going to do so.  Pt states he is drinking plenty of fluids.  Denies any diarrhea or mouth sores and states he is using baking soda and salt mouth rinse.  Pt states he did experience some "jawbone pain," when taking a big bite out of a finger food yesterday, which had cheese, "like (his) jaw got stuck", but this seems to have gotten better.  Pt also reports he had some "muscle tightness" in his legs, no cramping or swelling, but this went away when he got up and walked around.  Informed pt these may be side effects from chemo (cold sensitivity, mild aches/pains), and he could take tylenol or his pain medication if needed, continue to drink fluids, apply heat to his leg muscles and elevate legs when resting, and try to move around as much as possible to avoid muscle stiffness.  Informed pt to call office if these symptoms worsen, or he develops increased pain, swelling, or trouble chewing or swallowing.  Pt verbalizes understanding.  Pt states he does have his drug handouts to refer to, and the number to call the office if needed.

## 2011-05-16 ENCOUNTER — Other Ambulatory Visit: Payer: Self-pay | Admitting: Oncology

## 2011-05-17 ENCOUNTER — Ambulatory Visit (HOSPITAL_BASED_OUTPATIENT_CLINIC_OR_DEPARTMENT_OTHER): Payer: Medicare Other | Admitting: Oncology

## 2011-05-17 ENCOUNTER — Ambulatory Visit: Payer: Medicare Other

## 2011-05-17 ENCOUNTER — Telehealth: Payer: Self-pay | Admitting: Oncology

## 2011-05-17 ENCOUNTER — Other Ambulatory Visit: Payer: Medicare Other | Admitting: Lab

## 2011-05-17 ENCOUNTER — Encounter: Payer: Medicare Other | Admitting: Nutrition

## 2011-05-17 ENCOUNTER — Ambulatory Visit: Payer: Medicare Other | Admitting: Nutrition

## 2011-05-17 VITALS — BP 115/54 | HR 88 | Temp 97.3°F | Ht 74.0 in | Wt 166.2 lb

## 2011-05-17 DIAGNOSIS — C182 Malignant neoplasm of ascending colon: Secondary | ICD-10-CM

## 2011-05-17 DIAGNOSIS — D509 Iron deficiency anemia, unspecified: Secondary | ICD-10-CM

## 2011-05-17 DIAGNOSIS — G4730 Sleep apnea, unspecified: Secondary | ICD-10-CM

## 2011-05-17 NOTE — Progress Notes (Signed)
OFFICE PROGRESS NOTE   INTERVAL HISTORY:   He completed a first cycle of chemotherapy beginning on 05/03/2011. He had mild nausea for several days following chemotherapy. No emesis. No mouth sores, diarrhea, or hand/foot pain. He feels well today. He denies neuropathy symptoms. He had "stiffness "in the lower leg bilaterally a few days following chemotherapy. This has resolved.  Objective:  Vital signs in last 24 hours:  Blood pressure 115/54, pulse 88, temperature 97.3 F (36.3 C), temperature source Oral, height 6\' 2"  (1.88 m), weight 166 lb 3.2 oz (75.388 kg).    HEENT: No thrush or ulcers Resp: Lungs clear bilaterally Cardio: Regular rate and GI: No hepatomegaly, nontender Vascular: No leg edema  Skin: Palms without erythema   Portacath/PICC-without erythema   Medications: I have reviewed the patient's current medications.  Assessment/Plan: 1.Moderately differentiated adenocarcinoma of the cecum, stage IIIB (T4 N1), status post a right colectomy on 04/08/2011 -the tumor was microsatellite stable and K-ras wild-type . He began adjuvant CAPOX chemotherapy on 05/03/2011. 2. History of colon polyps  3. Anemia secondary to iron deficiency and operative blood loss , improved on 05/03/2011. 4. Postoperative abdominal wall/flank hematoma  5. Sleep apnea  6. History of atrial fibrillation  7. History of esophageal strict  8. Family history of colon cancer and gastric cancer -genetic testing is pending  Disposition:  He tolerated the first cycle of adjuvant therapy with out significant acute toxicity. He will return for cycle 2 on 05/24/2011. He'll be seen for office visit prior to cycle 3 the   Lucile Shutters, MD  05/17/2011  8:35 PM

## 2011-05-17 NOTE — Assessment & Plan Note (Signed)
[name] is a 71 year old male patient of Dr. Kalman Drape diagnosed with stage III colon cancer.  HISTORY:  Includes diverticulosis, hyperlipidemia, anemia, atrial fibrillation, sleep apnea, GERD, esophageal stricture.   MEDICATIONS:  Include oxaliplatin, Xeloda, Compazine, Adderall, Colace, ferrous sulfate, Prilosec, saw palmetto, Zocor, and vitamin D1.  LABORATORIES:  Include glucose of 120.  HEIGHT:  74 inches. WEIGHT:  166.2 pounds. USUAL BODY WEIGHT:  180 pounds in January. BMI:  21.33.  The patient reports intermittent nausea, but no vomiting.  He denies problems moving his bowels or any diarrhea.  He has experienced some side effects from oxaliplatin in terms of intolerance to cold.  He denies other nutritional side effects.  NUTRITION DIAGNOSIS:  Unintended weight loss related to new diagnosis of colon cancer and associated treatments as evidenced by an 8% weight loss in the last 3 months.  INTERVENTION:  I have educated [name] and his wife on the importance of small frequent meals with adequate calories and protein to promote weight maintenance.  I have reviewed high-protein foods with the patient.  I have encouraged and provided him with a list of snacks he can choose between meals.  I have suggested he may want to try some Ensure or Boost or [employer], which could be heated, especially in the weeks right after his chemotherapy.  I have also educated him on strategies for eating around nausea and encouraged medication compliance.  I have answered his questions and provided him with fact sheets and samples of nutritional supplements.  MONITORING/EVALUATION (GOALS):  That patient will tolerate increased oral intake to minimize any further weight loss.  NEXT VISIT:  Monday, April 15th during chemotherapy.    ______________________________ [name], RD, LDN Clinical Nutrition Specialist BN/MEDQ  D:  05/17/2011  T:  05/17/2011  Job:  [id number]

## 2011-05-17 NOTE — Telephone Encounter (Signed)
appts made and printed for pt aom °

## 2011-05-19 ENCOUNTER — Ambulatory Visit: Payer: Medicare Other | Admitting: Oncology

## 2011-05-19 ENCOUNTER — Ambulatory Visit (INDEPENDENT_AMBULATORY_CARE_PROVIDER_SITE_OTHER): Payer: Medicare Other | Admitting: General Surgery

## 2011-05-19 ENCOUNTER — Encounter (INDEPENDENT_AMBULATORY_CARE_PROVIDER_SITE_OTHER): Payer: Self-pay | Admitting: General Surgery

## 2011-05-19 VITALS — BP 116/62 | HR 84 | Temp 97.6°F | Resp 16 | Ht 74.0 in | Wt 165.8 lb

## 2011-05-19 DIAGNOSIS — Z9889 Other specified postprocedural states: Secondary | ICD-10-CM

## 2011-05-19 NOTE — Progress Notes (Signed)
Operation:laparoscopic-assisted right colectomy  Date:April 08, 2011  Pathology:stage III colon cancer  HPI:  He is here for another postop visit.  He is tolerating the Port-A-Cath. He is doing well his diet and his bowel movements. He tolerated his first chemotherapy treatment well.   Physical Exam: Abd-soft, incision clean, intact and solid Chest- PAC site clean  Assessment:  Doing well postoperatively.  Plan:  Start activities as tolerated and we discussed what that means. Return visit 3 months.

## 2011-05-19 NOTE — Patient Instructions (Signed)
Activities as tolerated as we discussed. 

## 2011-05-20 ENCOUNTER — Encounter: Payer: Self-pay | Admitting: *Deleted

## 2011-05-20 NOTE — Telephone Encounter (Signed)
Erroneous encounter

## 2011-05-20 NOTE — Progress Notes (Signed)
RECEIVED A FAX FROM [employer] CONCERNING A CONFIRMATION OF PRESCRIPTION SHIPMENT FOR XELODA. 

## 2011-05-24 ENCOUNTER — Other Ambulatory Visit (HOSPITAL_BASED_OUTPATIENT_CLINIC_OR_DEPARTMENT_OTHER): Payer: Medicare Other | Admitting: Lab

## 2011-05-24 ENCOUNTER — Ambulatory Visit (HOSPITAL_BASED_OUTPATIENT_CLINIC_OR_DEPARTMENT_OTHER): Payer: Medicare Other

## 2011-05-24 ENCOUNTER — Ambulatory Visit: Payer: Medicare Other | Admitting: Nutrition

## 2011-05-24 VITALS — BP 112/66 | HR 80 | Temp 97.0°F

## 2011-05-24 DIAGNOSIS — C182 Malignant neoplasm of ascending colon: Secondary | ICD-10-CM

## 2011-05-24 DIAGNOSIS — Z5111 Encounter for antineoplastic chemotherapy: Secondary | ICD-10-CM

## 2011-05-24 DIAGNOSIS — C189 Malignant neoplasm of colon, unspecified: Secondary | ICD-10-CM

## 2011-05-24 DIAGNOSIS — H908 Mixed conductive and sensorineural hearing loss, unspecified: Secondary | ICD-10-CM

## 2011-05-24 LAB — CBC WITH DIFFERENTIAL/PLATELET
BASO%: 0.5 % (ref 0.0–2.0)
EOS%: 2.9 % (ref 0.0–7.0)
LYMPH%: 32.5 % (ref 14.0–49.0)
MCH: 30.3 pg (ref 27.2–33.4)
MCHC: 35.8 g/dL (ref 32.0–36.0)
MONO#: 0.6 10*3/uL (ref 0.1–0.9)
MONO%: 15.4 % — ABNORMAL HIGH (ref 0.0–14.0)
NEUT%: 48.7 % (ref 39.0–75.0)
Platelets: 170 10*3/uL (ref 140–400)
RBC: 4.23 10*6/uL (ref 4.20–5.82)
WBC: 4.2 10*3/uL (ref 4.0–10.3)
nRBC: 0 % (ref 0–0)

## 2011-05-24 LAB — COMPREHENSIVE METABOLIC PANEL
ALT: 17 U/L (ref 0–53)
AST: 17 U/L (ref 0–37)
Alkaline Phosphatase: 91 U/L (ref 39–117)
BUN: 9 mg/dL (ref 6–23)
Chloride: 106 mEq/L (ref 96–112)
Creatinine, Ser: 0.92 mg/dL (ref 0.50–1.35)
Total Bilirubin: 0.7 mg/dL (ref 0.3–1.2)

## 2011-05-24 MED ORDER — DEXTROSE 5 % IV SOLN
Freq: Once | INTRAVENOUS | Status: AC
  Administered 2011-05-24: 12:00:00 via INTRAVENOUS

## 2011-05-24 MED ORDER — ONDANSETRON 8 MG/50ML IVPB (CHCC)
8.0000 mg | Freq: Once | INTRAVENOUS | Status: AC
  Administered 2011-05-24: 8 mg via INTRAVENOUS

## 2011-05-24 MED ORDER — DEXAMETHASONE SODIUM PHOSPHATE 10 MG/ML IJ SOLN
10.0000 mg | Freq: Once | INTRAMUSCULAR | Status: AC
  Administered 2011-05-24: 10 mg via INTRAVENOUS

## 2011-05-24 MED ORDER — SODIUM CHLORIDE 0.9 % IJ SOLN
10.0000 mL | INTRAMUSCULAR | Status: DC | PRN
  Filled 2011-05-24: qty 10

## 2011-05-24 MED ORDER — OXALIPLATIN CHEMO INJECTION 100 MG/20ML
130.0000 mg/m2 | Freq: Once | INTRAVENOUS | Status: AC
  Administered 2011-05-24: 260 mg via INTRAVENOUS
  Filled 2011-05-24: qty 52

## 2011-05-24 MED ORDER — HEPARIN SOD (PORK) LOCK FLUSH 100 UNIT/ML IV SOLN
500.0000 [IU] | Freq: Once | INTRAVENOUS | Status: DC | PRN
  Filled 2011-05-24: qty 5

## 2011-05-24 NOTE — Patient Instructions (Signed)
[facility] Discharge Instructions for Patients Receiving Chemotherapy  Today you received the following chemotherapy agents Oxaliplatin  To help prevent nausea and vomiting after your treatment, we encourage you to take your nausea medication as prescribed.   If you develop nausea and vomiting that is not controlled by your nausea medication, call the clinic. If it is after clinic hours your family physician or the after hours number for the clinic or go to the Emergency Department.   BELOW ARE SYMPTOMS THAT SHOULD BE REPORTED IMMEDIATELY:  *FEVER GREATER THAN 100.5 F  *CHILLS WITH OR WITHOUT FEVER  NAUSEA AND VOMITING THAT IS NOT CONTROLLED WITH YOUR NAUSEA MEDICATION  *UNUSUAL SHORTNESS OF BREATH  *UNUSUAL BRUISING OR BLEEDING  TENDERNESS IN MOUTH AND THROAT WITH OR WITHOUT PRESENCE OF ULCERS  *URINARY PROBLEMS  *BOWEL PROBLEMS  UNUSUAL RASH Items with * indicate a potential emergency and should be followed up as soon as possible.  One of the nurses will contact you 24 hours after your treatment. Please let the nurse know about any problems that you may have experienced. Feel free to call the clinic you have any questions or concerns. The clinic phone number is [phone].   I have been informed and understand all the instructions given to me. I know to contact the clinic, my physician, or go to the Emergency Department if any problems should occur. I do not have any questions at this time, but understand that I may call the clinic during office hours   should I have any questions or need assistance in obtaining follow up care.    __________________________________________  _____________  __________ Signature of Patient or Authorized Representative            Date                   Time    __________________________________________ Nurse's Signature    

## 2011-05-24 NOTE — Progress Notes (Signed)
I spoke with Mr. and Mrs. Bates in the chemotherapy area.  The patient reports that he is doing quite well.  His weight is generally stable and was documented as 165.8 pounds on the April 10th at another physician's office.  He reports he has had an increased appetite.  He has experienced some occasional nausea which was controlled by his Compazine.  He does not complain about any issues with his bowels.  He is tolerating stool softeners on a daily basis.  He does have general questions on healthy nutrition during our visit today.  NUTRITION DIAGNOSIS:  Unintended weight loss has improved.  INTERVENTION:  I have enforced the importance of patient consuming small frequent meals with adequate calories and protein to promote weight maintenance.  I have again reviewed healthy food options for lean protein sources including vegetarian options.  I have encouraged the patient to increase vegetables and plant-based foods when possible to promote a healthier weight.  He is to continue to work in adequate protein to maintain muscle stores.  MONITORING, EVALUATION, AND GOALS:  The patient has tolerated his oral diet, to promote weight maintenance.  NEXT VISIT:  Monday, May 6th, during chemotherapy.    ______________________________ Zenovia Jarred, RD, LDN Clinical Nutrition Specialist BN/MEDQ  D:  05/24/2011  T:  05/24/2011  Job:  [id number]

## 2011-06-08 ENCOUNTER — Encounter: Payer: Self-pay | Admitting: *Deleted

## 2011-06-08 NOTE — Progress Notes (Signed)
RECEIVED A FAX FROM [employer] CONCERNING A CONFIRMATION OF PRESCRIPTION SHIPMENT FOR XELODA. 

## 2011-06-13 ENCOUNTER — Other Ambulatory Visit: Payer: Self-pay | Admitting: Oncology

## 2011-06-14 ENCOUNTER — Telehealth: Payer: Self-pay | Admitting: Oncology

## 2011-06-14 ENCOUNTER — Ambulatory Visit: Payer: Medicare Other | Admitting: Nutrition

## 2011-06-14 ENCOUNTER — Ambulatory Visit (HOSPITAL_BASED_OUTPATIENT_CLINIC_OR_DEPARTMENT_OTHER): Payer: Medicare Other | Admitting: Oncology

## 2011-06-14 ENCOUNTER — Telehealth: Payer: Self-pay | Admitting: *Deleted

## 2011-06-14 ENCOUNTER — Other Ambulatory Visit (HOSPITAL_BASED_OUTPATIENT_CLINIC_OR_DEPARTMENT_OTHER): Payer: Medicare Other | Admitting: Lab

## 2011-06-14 ENCOUNTER — Ambulatory Visit (HOSPITAL_BASED_OUTPATIENT_CLINIC_OR_DEPARTMENT_OTHER): Payer: Medicare Other

## 2011-06-14 VITALS — BP 133/64 | HR 72 | Temp 96.4°F | Ht 74.0 in | Wt 176.1 lb

## 2011-06-14 DIAGNOSIS — C182 Malignant neoplasm of ascending colon: Secondary | ICD-10-CM

## 2011-06-14 DIAGNOSIS — Z5111 Encounter for antineoplastic chemotherapy: Secondary | ICD-10-CM

## 2011-06-14 DIAGNOSIS — H908 Mixed conductive and sensorineural hearing loss, unspecified: Secondary | ICD-10-CM

## 2011-06-14 LAB — CBC WITH DIFFERENTIAL/PLATELET
BASO%: 0.4 % (ref 0.0–2.0)
Basophils Absolute: 0 10*3/uL (ref 0.0–0.1)
EOS%: 2.9 % (ref 0.0–7.0)
HCT: 35.1 % — ABNORMAL LOW (ref 38.4–49.9)
HGB: 12.7 g/dL — ABNORMAL LOW (ref 13.0–17.1)
LYMPH%: 30.2 % (ref 14.0–49.0)
MCH: 31 pg (ref 27.2–33.4)
MCHC: 36.2 g/dL — ABNORMAL HIGH (ref 32.0–36.0)
MCV: 85.6 fL (ref 79.3–98.0)
NEUT%: 51.2 % (ref 39.0–75.0)
Platelets: 131 10*3/uL — ABNORMAL LOW (ref 140–400)
lymph#: 1.6 10*3/uL (ref 0.9–3.3)

## 2011-06-14 LAB — COMPREHENSIVE METABOLIC PANEL
AST: 41 U/L — ABNORMAL HIGH (ref 0–37)
Albumin: 3.4 g/dL — ABNORMAL LOW (ref 3.5–5.2)
Alkaline Phosphatase: 102 U/L (ref 39–117)
BUN: 8 mg/dL (ref 6–23)
Calcium: 9 mg/dL (ref 8.4–10.5)
Chloride: 105 mEq/L (ref 96–112)
Creatinine, Ser: 0.88 mg/dL (ref 0.50–1.35)
Glucose, Bld: 101 mg/dL — ABNORMAL HIGH (ref 70–99)
Potassium: 3.8 mEq/L (ref 3.5–5.3)

## 2011-06-14 MED ORDER — OXALIPLATIN CHEMO INJECTION 100 MG/20ML
130.0000 mg/m2 | Freq: Once | INTRAVENOUS | Status: AC
  Administered 2011-06-14: 260 mg via INTRAVENOUS
  Filled 2011-06-14: qty 52

## 2011-06-14 MED ORDER — DEXTROSE 5 % IV SOLN
Freq: Once | INTRAVENOUS | Status: AC
  Administered 2011-06-14: 13:00:00 via INTRAVENOUS

## 2011-06-14 MED ORDER — ONDANSETRON 8 MG/50ML IVPB (CHCC)
8.0000 mg | Freq: Once | INTRAVENOUS | Status: AC
  Administered 2011-06-14: 8 mg via INTRAVENOUS

## 2011-06-14 MED ORDER — SODIUM CHLORIDE 0.9 % IJ SOLN
10.0000 mL | INTRAMUSCULAR | Status: DC | PRN
  Administered 2011-06-14: 10 mL
  Filled 2011-06-14: qty 10

## 2011-06-14 MED ORDER — DEXAMETHASONE SODIUM PHOSPHATE 10 MG/ML IJ SOLN
10.0000 mg | Freq: Once | INTRAMUSCULAR | Status: AC
  Administered 2011-06-14: 10 mg via INTRAVENOUS

## 2011-06-14 MED ORDER — HEPARIN SOD (PORK) LOCK FLUSH 100 UNIT/ML IV SOLN
500.0000 [IU] | Freq: Once | INTRAVENOUS | Status: AC | PRN
  Administered 2011-06-14: 500 [IU]
  Filled 2011-06-14: qty 5

## 2011-06-14 NOTE — Telephone Encounter (Signed)
appts made and printed   aom 

## 2011-06-14 NOTE — Progress Notes (Signed)
[name] reports that he is actually eating very well.  His weight has increased to 176.1 pounds documented today from 165.8 pounds documented April 10.  The patient reports that he has had a better appetite.  He has been able to eat without nausea, and has no issues with his bowel movements.  He is really focusing in on eating small amounts more often and focusing in on foods with protein.  NUTRITION DIAGNOSIS:  Unintended weight loss has improved.  INTERVENTION:  I have encouraged [name] to continue consuming small amounts of calories and protein throughout the day to promote weight maintenance.  I have reviewed healthy lean protein options with him and encouraged him to continue to consume foods on a frequent basis throughout the day.  MONITORING, EVALUATION, GOALS:  The patient continues to tolerate oral diet to promote weight maintenance or safe weight gain.  NEXT VISIT:  Tuesday, May 28 during chemotherapy.    ______________________________ Zenovia Jarred, RD, LDN Clinical Nutrition Specialist BN/MEDQ  D:  06/14/2011  T:  06/14/2011  Job:  [id number]

## 2011-06-14 NOTE — Telephone Encounter (Signed)
Patient called to request copy of his labs and AVS from today. Printed and put in mail.

## 2011-06-14 NOTE — Progress Notes (Signed)
OFFICE PROGRESS NOTE   INTERVAL HISTORY:   He returns as scheduled. He completed a second cycle of CAPOX chemotherapy beginning on 05/24/2011. He had cold sensitivity beginning on the day of oxaliplatin and this lasted for approximately 10 days. The cold sensitivity gradually improved. He denies neuropathy symptoms today. He denies mouth sores, nausea, and diarrhea. He developed erythema and tenderness at the soles of the feet last week. This improved when he used a "cream ". He had discomfort at the right groin area for a few days following chemotherapy.  Objective:  Vital signs in last 24 hours:  Blood pressure 133/64, pulse 72, temperature 96.4 F (35.8 C), temperature source Oral, height 6\' 2"  (1.88 m), weight 176 lb 1.6 oz (79.878 kg).    HEENT: No thrush or ulcers Resp: Lungs clear bilaterally Cardio: Regular rate and rhythm GI: No hepatomegaly, nontender. Examination of the right groin area is unremarkable. Vascular: No leg edema Neuro: Mild decrease in vibratory sense at the fingertip bilaterally  Skin: Mild erythema at the soles. No ulceration. Palms without erythema or skin breakdown.   Portacath/PICC-without erythema  Lab Results:  Lab Results  Component Value Date   WBC 5.2 06/14/2011   HGB 12.7* 06/14/2011   HCT 35.1* 06/14/2011   MCV 85.6 06/14/2011   PLT 131* 06/14/2011   ANC 2.7    Medications: I have reviewed the patient's current medications.  Assessment/Plan: 1.Moderately differentiated adenocarcinoma of the cecum, stage IIIB (T4 N1), status post a right colectomy on 04/08/2011 -the tumor was microsatellite stable and K-ras wild-type . He began adjuvant CAPOX chemotherapy on 05/03/2011.  2. History of colon polyps  3. Anemia secondary to iron deficiency and operative blood loss -improved 4. Postoperative abdominal wall/flank hematoma  5. Sleep apnea  6. History of atrial fibrillation  7. History of esophageal strict  8. Family history of colon cancer and  gastric cancer -genetic testing is pending  9. Hand/foot syndrome secondary to [facility]. He will contact us for recurrent symptoms 10. 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 He will complete cycle 3 beginning today. He will return for an office visit and cycle 4 in 3 weeks.  Mr. Weida will contact us if he develops worsening hand/foot pain with this cycle of chemotherapy. We will followup on his genetic testing.   Thornton Papas, MD  06/14/2011  12:43 PM

## 2011-06-29 NOTE — Progress Notes (Signed)
Received fax confirmation from [employer] that pt's Xeloda was shipped for delivery on 5/21. dph

## 2011-07-04 ENCOUNTER — Other Ambulatory Visit: Payer: Self-pay | Admitting: Oncology

## 2011-07-06 ENCOUNTER — Other Ambulatory Visit (HOSPITAL_BASED_OUTPATIENT_CLINIC_OR_DEPARTMENT_OTHER): Payer: Medicare Other | Admitting: Lab

## 2011-07-06 ENCOUNTER — Telehealth: Payer: Self-pay | Admitting: Oncology

## 2011-07-06 ENCOUNTER — Ambulatory Visit: Payer: Medicare Other

## 2011-07-06 ENCOUNTER — Ambulatory Visit (HOSPITAL_BASED_OUTPATIENT_CLINIC_OR_DEPARTMENT_OTHER): Payer: Medicare Other | Admitting: Oncology

## 2011-07-06 ENCOUNTER — Encounter: Payer: Medicare Other | Admitting: Nutrition

## 2011-07-06 VITALS — BP 134/71 | HR 80 | Temp 96.9°F | Ht 74.0 in | Wt 180.3 lb

## 2011-07-06 DIAGNOSIS — D702 Other drug-induced agranulocytosis: Secondary | ICD-10-CM

## 2011-07-06 DIAGNOSIS — C180 Malignant neoplasm of cecum: Secondary | ICD-10-CM

## 2011-07-06 DIAGNOSIS — C182 Malignant neoplasm of ascending colon: Secondary | ICD-10-CM

## 2011-07-06 DIAGNOSIS — D6959 Other secondary thrombocytopenia: Secondary | ICD-10-CM

## 2011-07-06 DIAGNOSIS — D508 Other iron deficiency anemias: Secondary | ICD-10-CM

## 2011-07-06 LAB — CBC WITH DIFFERENTIAL/PLATELET
Basophils Absolute: 0 10*3/uL (ref 0.0–0.1)
Eosinophils Absolute: 0.1 10*3/uL (ref 0.0–0.5)
HCT: 30.8 % — ABNORMAL LOW (ref 38.4–49.9)
HGB: 10.8 g/dL — ABNORMAL LOW (ref 13.0–17.1)
NEUT#: 1.2 10*3/uL — ABNORMAL LOW (ref 1.5–6.5)
NEUT%: 44.4 % (ref 39.0–75.0)
RDW: 20.5 % — ABNORMAL HIGH (ref 11.0–14.6)
lymph#: 1 10*3/uL (ref 0.9–3.3)

## 2011-07-06 NOTE — Telephone Encounter (Signed)
appts made and printed for pt aom °

## 2011-07-06 NOTE — Progress Notes (Signed)
   [facility]    OFFICE PROGRESS NOTE   INTERVAL HISTORY:   He completed another cycle of CAPOX beginning on 06/14/2011. He noted one mouth sore on the right gum or tongue. He denies nausea, diarrhea, and hand/foot pain. He had prolonged cold sensitivity. No numbness today.  Objective:  Vital signs in last 24 hours:  Blood pressure 134/71, pulse 80, temperature 96.9 F (36.1 C), temperature source Oral, height 6\' 2"  (1.88 m), weight 180 lb 4.8 oz (81.784 kg).    HEENT: 2 mm ulcer at the lower inner lip. No other ulcers. No thrush Resp: Lungs clear bilaterally Cardio: Regular rate and rhythm GI: No hepatomegaly, nontender Vascular: No leg edema Neuro: Mild decrease in vibratory sense at the fingertip bilaterally  Skin: Palms without erythema   Portacath/PICC-without erythema  Lab Results:  Lab Results  Component Value Date   WBC 2.7* 07/06/2011   HGB 10.8* 07/06/2011   HCT 30.8* 07/06/2011   MCV 90.3 07/06/2011   PLT 85* 07/06/2011   ANC 1.2    Medications: I have reviewed the patient's current medications.  Assessment/Plan: 1.Moderately differentiated adenocarcinoma of the cecum, stage IIIB (T4 N1), status post a right colectomy on 04/08/2011 -the tumor was microsatellite stable and K-ras wild-type . He began adjuvant CAPOX chemotherapy on 05/03/2011. He completed cycle 3 on 06/14/2011 2. History of colon polyps  3. Anemia secondary to iron deficiency, recent worsening due to chemotherapy 4. Postoperative abdominal wall/flank hematoma  5. Sleep apnea  6. History of atrial fibrillation  7. History of esophageal strict  8. Family history of colon cancer and gastric cancer -genetic testing is pending  9. 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 Neutropenia/thrombocytopenia secondary  tochemotherapy  He has early oxaliplatin neuropathy. We decided to hold the oxaliplatin today. He will proceed with Xeloda chemotherapy as scheduled. He will return for a CBC next week.  Mr. Peatross will return for an office visit and the next cycle of chemotherapy in 3 weeks. The oxaliplatin will be dose reduced with the next cycle.     Thornton Papas, MD  07/06/2011  6:26 PM

## 2011-07-09 ENCOUNTER — Telehealth: Payer: Self-pay | Admitting: *Deleted

## 2011-07-09 NOTE — Telephone Encounter (Signed)
Patient called to request nurse review neutropenic and bleeding precautions with him and to schedule his 1 week lab for 07/16/11. Reviewed precautions and when to call office. He verbalizes understanding.

## 2011-07-16 ENCOUNTER — Other Ambulatory Visit (HOSPITAL_BASED_OUTPATIENT_CLINIC_OR_DEPARTMENT_OTHER): Payer: Medicare Other | Admitting: Lab

## 2011-07-16 DIAGNOSIS — C182 Malignant neoplasm of ascending colon: Secondary | ICD-10-CM

## 2011-07-16 LAB — CBC WITH DIFFERENTIAL/PLATELET
Basophils Absolute: 0 10*3/uL (ref 0.0–0.1)
Eosinophils Absolute: 0.1 10*3/uL (ref 0.0–0.5)
HCT: 31.4 % — ABNORMAL LOW (ref 38.4–49.9)
HGB: 10.9 g/dL — ABNORMAL LOW (ref 13.0–17.1)
LYMPH%: 36.5 % (ref 14.0–49.0)
MCV: 94.7 fL (ref 79.3–98.0)
MONO#: 0.3 10*3/uL (ref 0.1–0.9)
NEUT#: 1.3 10*3/uL — ABNORMAL LOW (ref 1.5–6.5)
NEUT%: 49 % (ref 39.0–75.0)
Platelets: 108 10*3/uL — ABNORMAL LOW (ref 140–400)
WBC: 2.6 10*3/uL — ABNORMAL LOW (ref 4.0–10.3)

## 2011-07-20 ENCOUNTER — Other Ambulatory Visit: Payer: Self-pay | Admitting: *Deleted

## 2011-07-20 DIAGNOSIS — C182 Malignant neoplasm of ascending colon: Secondary | ICD-10-CM

## 2011-07-20 MED ORDER — CAPECITABINE 500 MG PO TABS: 2000.0000 mg | ORAL_TABLET | Freq: Two times a day (BID) | ORAL | Status: DC

## 2011-07-20 NOTE — Telephone Encounter (Signed)
THIS REFILL REQUEST WAS GIVEN TO DR.SHERRILL'S NURSE, SUSAN COWARD,RN. 

## 2011-07-20 NOTE — Telephone Encounter (Signed)
Addended by: Arvilla Meres on: 07/20/2011 04:24 PM   Modules accepted: Orders

## 2011-07-21 ENCOUNTER — Telehealth: Payer: Self-pay | Admitting: Medical Oncology

## 2011-07-21 NOTE — Telephone Encounter (Signed)
Fax confirmation receipt

## 2011-07-22 ENCOUNTER — Telehealth: Payer: Self-pay | Admitting: *Deleted

## 2011-07-22 ENCOUNTER — Telehealth: Payer: Self-pay | Admitting: Emergency Medicine

## 2011-07-22 NOTE — Telephone Encounter (Signed)
Xeloda to be delivered 07/21/11

## 2011-07-22 NOTE — Telephone Encounter (Signed)
Phone call to patient to check in and assess for any needs or questions.  Patient states he is doing well and enjoying that he can drink cold drinks since last Oxaliplatin was held.  He is anticipating getting the Oxaliplatin with next chemo visit.  He denies any complaints at present including fever, chills, pain, increased numbness/tingling, diarrhea etc.  This RN reminded patient to call for any needs or questions and he verbalized understanding. Will continue to follow.

## 2011-07-23 ENCOUNTER — Telehealth: Payer: Self-pay | Admitting: *Deleted

## 2011-07-23 ENCOUNTER — Encounter: Payer: Self-pay | Admitting: Pharmacist

## 2011-07-23 NOTE — Telephone Encounter (Signed)
Instructed patient to soak his feet in epsom salt twice daily and pat area dry. Apply antibiotic ointment and put gauze or cotton ball between those toes to protect from friction. Suggested he be sure not to wear tight fitting shoes or socks. Call if toe or foot gets red or hot. Follow up on Monday as scheduled.

## 2011-07-23 NOTE — Telephone Encounter (Signed)
Has sore area open between his right great toe and 2nd toe. Asking how to manage it? Is on Xeloda.

## 2011-07-25 ENCOUNTER — Other Ambulatory Visit: Payer: Self-pay | Admitting: Oncology

## 2011-07-26 ENCOUNTER — Ambulatory Visit (HOSPITAL_BASED_OUTPATIENT_CLINIC_OR_DEPARTMENT_OTHER): Payer: Medicare Other

## 2011-07-26 ENCOUNTER — Ambulatory Visit (HOSPITAL_BASED_OUTPATIENT_CLINIC_OR_DEPARTMENT_OTHER): Payer: Medicare Other | Admitting: Oncology

## 2011-07-26 ENCOUNTER — Other Ambulatory Visit (HOSPITAL_BASED_OUTPATIENT_CLINIC_OR_DEPARTMENT_OTHER): Payer: Medicare Other | Admitting: Lab

## 2011-07-26 ENCOUNTER — Ambulatory Visit: Payer: Medicare Other | Admitting: Nutrition

## 2011-07-26 ENCOUNTER — Telehealth: Payer: Self-pay | Admitting: *Deleted

## 2011-07-26 VITALS — BP 121/60 | HR 71 | Temp 97.0°F | Ht 74.0 in | Wt 181.1 lb

## 2011-07-26 DIAGNOSIS — C182 Malignant neoplasm of ascending colon: Secondary | ICD-10-CM

## 2011-07-26 DIAGNOSIS — C180 Malignant neoplasm of cecum: Secondary | ICD-10-CM

## 2011-07-26 DIAGNOSIS — H908 Mixed conductive and sensorineural hearing loss, unspecified: Secondary | ICD-10-CM

## 2011-07-26 DIAGNOSIS — Z5111 Encounter for antineoplastic chemotherapy: Secondary | ICD-10-CM

## 2011-07-26 LAB — CBC WITH DIFFERENTIAL/PLATELET
BASO%: 0.5 % (ref 0.0–2.0)
HCT: 30.1 % — ABNORMAL LOW (ref 38.4–49.9)
LYMPH%: 32.4 % (ref 14.0–49.0)
MCH: 34.8 pg — ABNORMAL HIGH (ref 27.2–33.4)
MCHC: 35.3 g/dL (ref 32.0–36.0)
MCV: 98.8 fL — ABNORMAL HIGH (ref 79.3–98.0)
MONO#: 0.3 10*3/uL (ref 0.1–0.9)
MONO%: 11.4 % (ref 0.0–14.0)
NEUT%: 51.5 % (ref 39.0–75.0)
Platelets: 138 10*3/uL — ABNORMAL LOW (ref 140–400)
WBC: 2.9 10*3/uL — ABNORMAL LOW (ref 4.0–10.3)

## 2011-07-26 LAB — COMPREHENSIVE METABOLIC PANEL
ALT: 27 U/L (ref 0–53)
Alkaline Phosphatase: 90 U/L (ref 39–117)
CO2: 27 mEq/L (ref 19–32)
Creatinine, Ser: 0.89 mg/dL (ref 0.50–1.35)
Total Bilirubin: 1 mg/dL (ref 0.3–1.2)

## 2011-07-26 MED ORDER — ONDANSETRON 8 MG/50ML IVPB (CHCC)
8.0000 mg | Freq: Once | INTRAVENOUS | Status: AC
  Administered 2011-07-26: 8 mg via INTRAVENOUS

## 2011-07-26 MED ORDER — DEXTROSE 5 % IV SOLN
Freq: Once | INTRAVENOUS | Status: AC
  Administered 2011-07-26: 10:00:00 via INTRAVENOUS

## 2011-07-26 MED ORDER — SODIUM CHLORIDE 0.9 % IJ SOLN
10.0000 mL | INTRAMUSCULAR | Status: DC | PRN
  Administered 2011-07-26: 10 mL
  Filled 2011-07-26: qty 10

## 2011-07-26 MED ORDER — OXALIPLATIN CHEMO INJECTION 100 MG/20ML
104.0000 mg/m2 | Freq: Once | INTRAVENOUS | Status: AC
  Administered 2011-07-26: 210 mg via INTRAVENOUS
  Filled 2011-07-26: qty 42

## 2011-07-26 MED ORDER — HEPARIN SOD (PORK) LOCK FLUSH 100 UNIT/ML IV SOLN
500.0000 [IU] | Freq: Once | INTRAVENOUS | Status: AC | PRN
  Administered 2011-07-26: 500 [IU]
  Filled 2011-07-26: qty 5

## 2011-07-26 MED ORDER — DEXAMETHASONE SODIUM PHOSPHATE 10 MG/ML IJ SOLN
10.0000 mg | Freq: Once | INTRAMUSCULAR | Status: AC
  Administered 2011-07-26: 10 mg via INTRAVENOUS

## 2011-07-26 NOTE — Telephone Encounter (Signed)
Per staff message I have scheduled appts. [name]  

## 2011-07-26 NOTE — Patient Instructions (Signed)
Cancer Center Discharge Instructions for Patients Receiving Chemotherapy  Today you received the following chemotherapy agents Oxaliplatin  To help prevent nausea and vomiting after your treatment, we encourage you to take your nausea medication.   If you develop nausea and vomiting that is not controlled by your nausea medication, call the clinic. If it is after clinic hours your family physician or the after hours number for the clinic or go to the Emergency Department.   BELOW ARE SYMPTOMS THAT SHOULD BE REPORTED IMMEDIATELY:  *FEVER GREATER THAN 100.5 F  *CHILLS WITH OR WITHOUT FEVER  NAUSEA AND VOMITING THAT IS NOT CONTROLLED WITH YOUR NAUSEA MEDICATION  *UNUSUAL SHORTNESS OF BREATH  *UNUSUAL BRUISING OR BLEEDING  TENDERNESS IN MOUTH AND THROAT WITH OR WITHOUT PRESENCE OF ULCERS  *URINARY PROBLEMS  *BOWEL PROBLEMS  UNUSUAL RASH Items with * indicate a potential emergency and should be followed up as soon as possible.  One of the nurses will contact you 24 hours after your treatment. Please let the nurse know about any problems that you may have experienced. Feel free to call the clinic you have any questions or concerns. The clinic phone number is [phone].   I have been informed and understand all the instructions given to me. I know to contact the clinic, my physician, or go to the Emergency Department if any problems should occur. I do not have any questions at this time, but understand that I may call the clinic during office hours   should I have any questions or need assistance in obtaining follow up care.    __________________________________________  _____________  __________ Signature of Patient or Authorized Representative            Date                   Time    __________________________________________ Nurse's Signature    

## 2011-07-26 NOTE — Progress Notes (Signed)
   [facility]    OFFICE PROGRESS NOTE   INTERVAL HISTORY:   He returns as scheduled. He completed cycle 4 of chemotherapy beginning on 07/06/11. He reports less fatigue following this cycle of chemotherapy. He has noted increased erythema at the palms and soles. No mouth sores. No significant diarrhea. He has a mild difference in sensation at the tips of the fingers, but he does not characterize this as numbness. No difficulty with activities. He has noted erythema surrounding the left great toenail. No pain.  Objective:  Vital signs in last 24 hours:  Blood pressure 121/60, pulse 71, temperature 97 F (36.1 C), temperature source Oral, height 6\' 2"  (1.88 m), weight 181 lb 1.6 oz (82.146 kg).    HEENT: No thrush oral Resp: Lungs clear bilaterally Cardio: Regular rate and rhythm GI: No hepatomegaly, nontender Vascular: No leg edema Neuro: Mild decrease in vibratory sense at the fingertips bilaterally  Skin: Mild erythema at the palms and soles. No skin breakdown. Mild induration surrounding the left great toe.   Portacath/PICC-without erythema  Lab Results:  Lab Results  Component Value Date   WBC 2.9* 07/26/2011   HGB 10.6* 07/26/2011   HCT 30.1* 07/26/2011   MCV 98.8* 07/26/2011   PLT 138* 07/26/2011   ANC 1.5    Medications: I have reviewed the patient's current medications.  Assessment/Plan: 1.Moderately differentiated adenocarcinoma of the cecum, stage IIIB (T4 N1), status post a right colectomy on 04/08/2011 -the tumor was microsatellite stable and K-ras wild-type . He began adjuvant CAPOX chemotherapy on 05/03/2011. He completed cycle 4 on 07/06/2011 (oxaliplatin was held from cycle 4)  2. History of colon polyps  3. Anemia secondary to iron deficiency, recent worsening due to chemotherapy-stable today  4. Postoperative abdominal wall/flank hematoma  5. Sleep apnea  6. History of atrial fibrillation  7. History of esophageal strict  8. Family  history of colon cancer and gastric cancer -genetic testing is pending  9. 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 Neutropenia/thrombocytopenia secondary tochemotherapy -improved   Disposition:  He will complete a fifth cycle of CAPOX beginning today. Oxaliplatin will be reintroduced into the chemotherapy regimen today. [name] will return for an office visit and chemotherapy in 3 weeks. The oxaliplatin will be dose reduced with this cycle.   Russell Papas, MD  07/26/2011  10:11 AM

## 2011-07-26 NOTE — Progress Notes (Signed)
I spoke with [name] in the chemotherapy area, who reports that his oral intake has increased.  His weight has also increased to 181.8 pounds from 176.1 pounds documented May 6th.  The patient feels like some of this weight gain could be attributed to some fluid retention; however, he does admit to eating much better since 1 of his chemotherapies was held.  He is interested in information today on ways to help improve his red and white blood cell count.  NUTRITION DIAGNOSIS:  Unintended weight loss has resolved.  NEW NUTRITION DIAGNOSIS:  Food and nutrition-related knowledge deficit related to stage III colon cancer and associated treatments, as evidenced by no prior need for nutrition-related information.  INTERVENTION:  I have encouraged [name] to continue small frequent meals utilizing adequate calories and protein to promote weight stabilization.  We have discussed foods to incorporate into his meals and snacks that could impact his production or blood cells.  I have again briefly reviewed with him safety precautions for being sure to wash fruits and vegetables well.  I have provided some additional protein options for him.  MONITORING/EVALUATION/GOALS:  The patient will continue to incorporate adequate calories and protein to promote preservation of lean body mass.  NEXT VISIT:  Monday, July 8th during chemotherapy.    ______________________________ Zenovia Jarred, RD, LDN Clinical Nutrition Specialist BN/MEDQ  D:  07/26/2011  T:  07/26/2011  Job:  [id number]

## 2011-08-02 ENCOUNTER — Telehealth: Payer: Self-pay | Admitting: Oncology

## 2011-08-02 NOTE — Telephone Encounter (Signed)
Talked to pt, she is aware of appt in July 2013

## 2011-08-10 ENCOUNTER — Telehealth: Payer: Self-pay | Admitting: *Deleted

## 2011-08-10 NOTE — Telephone Encounter (Signed)
Cold symptoms with temp 99.2-asking if any special precautions need to be taken? Instructed him to push po fluids, monitor temp and call for fever >101. Cold is caused by virus, so no antibiotic is necessary. Call for shortness of breath or chest congestion that becomes copious and dark or signs of sinus infection. OK to take whatever OTC cold relief medications he has used in the past.

## 2011-08-11 ENCOUNTER — Encounter: Payer: Self-pay | Admitting: Genetic Counselor

## 2011-08-11 ENCOUNTER — Telehealth: Payer: Self-pay | Admitting: *Deleted

## 2011-08-11 NOTE — Telephone Encounter (Signed)
Received fax confirmation from [facility] that pt's Xeloda was shipped on 08/10/11, scheduled for next day delivery.

## 2011-08-15 ENCOUNTER — Other Ambulatory Visit: Payer: Self-pay | Admitting: Oncology

## 2011-08-16 ENCOUNTER — Ambulatory Visit (HOSPITAL_BASED_OUTPATIENT_CLINIC_OR_DEPARTMENT_OTHER): Payer: Medicare Other | Admitting: Oncology

## 2011-08-16 ENCOUNTER — Ambulatory Visit: Payer: Medicare Other | Admitting: Nutrition

## 2011-08-16 ENCOUNTER — Other Ambulatory Visit (HOSPITAL_BASED_OUTPATIENT_CLINIC_OR_DEPARTMENT_OTHER): Payer: Medicare Other | Admitting: Lab

## 2011-08-16 ENCOUNTER — Ambulatory Visit (HOSPITAL_BASED_OUTPATIENT_CLINIC_OR_DEPARTMENT_OTHER): Payer: Medicare Other

## 2011-08-16 ENCOUNTER — Telehealth: Payer: Self-pay | Admitting: Oncology

## 2011-08-16 VITALS — BP 112/53 | HR 85 | Temp 96.8°F | Ht 74.0 in | Wt 186.4 lb

## 2011-08-16 DIAGNOSIS — C180 Malignant neoplasm of cecum: Secondary | ICD-10-CM

## 2011-08-16 DIAGNOSIS — C182 Malignant neoplasm of ascending colon: Secondary | ICD-10-CM

## 2011-08-16 DIAGNOSIS — H908 Mixed conductive and sensorineural hearing loss, unspecified: Secondary | ICD-10-CM

## 2011-08-16 DIAGNOSIS — Z5111 Encounter for antineoplastic chemotherapy: Secondary | ICD-10-CM

## 2011-08-16 LAB — CBC WITH DIFFERENTIAL/PLATELET
BASO%: 0.5 % (ref 0.0–2.0)
Basophils Absolute: 0 10*3/uL (ref 0.0–0.1)
EOS%: 3.9 % (ref 0.0–7.0)
HCT: 32 % — ABNORMAL LOW (ref 38.4–49.9)
HGB: 11.4 g/dL — ABNORMAL LOW (ref 13.0–17.1)
LYMPH%: 27.9 % (ref 14.0–49.0)
MCH: 36.6 pg — ABNORMAL HIGH (ref 27.2–33.4)
MCHC: 35.7 g/dL (ref 32.0–36.0)
MONO#: 0.4 10*3/uL (ref 0.1–0.9)
NEUT%: 54.7 % (ref 39.0–75.0)
Platelets: 122 10*3/uL — ABNORMAL LOW (ref 140–400)
lymph#: 0.8 10*3/uL — ABNORMAL LOW (ref 0.9–3.3)

## 2011-08-16 LAB — COMPREHENSIVE METABOLIC PANEL
ALT: 26 U/L (ref 0–53)
BUN: 7 mg/dL (ref 6–23)
CO2: 24 mEq/L (ref 19–32)
Calcium: 9.2 mg/dL (ref 8.4–10.5)
Chloride: 106 mEq/L (ref 96–112)
Creatinine, Ser: 0.9 mg/dL (ref 0.50–1.35)
Total Bilirubin: 1.3 mg/dL — ABNORMAL HIGH (ref 0.3–1.2)

## 2011-08-16 MED ORDER — ONDANSETRON 8 MG/50ML IVPB (CHCC)
8.0000 mg | Freq: Once | INTRAVENOUS | Status: AC
  Administered 2011-08-16: 8 mg via INTRAVENOUS

## 2011-08-16 MED ORDER — HEPARIN SOD (PORK) LOCK FLUSH 100 UNIT/ML IV SOLN
500.0000 [IU] | Freq: Once | INTRAVENOUS | Status: AC | PRN
  Administered 2011-08-16: 500 [IU]
  Filled 2011-08-16: qty 5

## 2011-08-16 MED ORDER — OXALIPLATIN CHEMO INJECTION 100 MG/20ML
104.0000 mg/m2 | Freq: Once | INTRAVENOUS | Status: AC
  Administered 2011-08-16: 210 mg via INTRAVENOUS
  Filled 2011-08-16: qty 42

## 2011-08-16 MED ORDER — DEXTROSE 5 % IV SOLN
Freq: Once | INTRAVENOUS | Status: AC
  Administered 2011-08-16: 10:00:00 via INTRAVENOUS

## 2011-08-16 MED ORDER — SODIUM CHLORIDE 0.9 % IJ SOLN
10.0000 mL | INTRAMUSCULAR | Status: DC | PRN
  Administered 2011-08-16: 10 mL
  Filled 2011-08-16: qty 10

## 2011-08-16 MED ORDER — DEXAMETHASONE SODIUM PHOSPHATE 10 MG/ML IJ SOLN
10.0000 mg | Freq: Once | INTRAMUSCULAR | Status: AC
  Administered 2011-08-16: 10 mg via INTRAVENOUS

## 2011-08-16 NOTE — Patient Instructions (Addendum)
[facility] Discharge Instructions for Patients Receiving Chemotherapy  Today you received the following chemotherapy agents oxaliplatin  To help prevent nausea and vomiting after your treatment, we encourage you to take your nausea medication  and take it as often as prescribed   If you develop nausea and vomiting that is not controlled by your nausea medication, call the clinic. If it is after clinic hours your family physician or the after hours number for the clinic or go to the Emergency Department.   BELOW ARE SYMPTOMS THAT SHOULD BE REPORTED IMMEDIATELY:  *FEVER GREATER THAN 100.5 F  *CHILLS WITH OR WITHOUT FEVER  NAUSEA AND VOMITING THAT IS NOT CONTROLLED WITH YOUR NAUSEA MEDICATION  *UNUSUAL SHORTNESS OF BREATH  *UNUSUAL BRUISING OR BLEEDING  TENDERNESS IN MOUTH AND THROAT WITH OR WITHOUT PRESENCE OF ULCERS  *URINARY PROBLEMS  *BOWEL PROBLEMS  UNUSUAL RASH Items with * indicate a potential emergency and should be followed up as soon as possible.  One of the nurses will contact you 24 hours after your treatment. Please let the nurse know about any problems that you may have experienced. Feel free to call the clinic you have any questions or concerns. The clinic phone number is [phone].   I have been informed and understand all the instructions given to me. I know to contact the clinic, my physician, or go to the Emergency Department if any problems should occur. I do not have any questions at this time, but understand that I may call the clinic during office hours   should I have any questions or need assistance in obtaining follow up care.    __________________________________________  _____________  __________ Signature of Patient or Authorized Representative            Date                   Time    __________________________________________ Nurse's Signature    

## 2011-08-16 NOTE — Telephone Encounter (Signed)
gv wife appt schedule for July.

## 2011-08-16 NOTE — Progress Notes (Signed)
[name] continues to do well and his weight has increased to 186.4 pounds today from 181.1 pounds June 17.  He continues to eat well.  He is not having any side effects nutritionally.  He has a very positive attitude.  NUTRITION DIAGNOSIS:  Food and nutrition related knowledge deficit has resolved.  I have encouraged [name] to continue to follow strategies of eating often throughout the day including adequate protein and calories for weight maintenance.  I have asked him to please contact me if he has any further questions or concerns or weight loss that is a result of poor appetite and poor oral intake.  The patient has agreed to contact me if he has any questions or concerns.  There is no followup scheduled.    ______________________________ Zenovia Jarred, RD, LDN Clinical Nutrition Specialist BN/MEDQ  D:  08/16/2011  T:  08/16/2011  Job:  [id number]

## 2011-08-16 NOTE — Progress Notes (Signed)
   [facility]    OFFICE PROGRESS NOTE   INTERVAL HISTORY:   He returns as scheduled. He completed another cycle of CAPOX beginning on 07/26/2011. He denies nausea/vomiting, mouth sores, diarrhea, and neuropathy symptoms. He has developed erythema at the palms and soles. No pain. He reports this has not changed significantly over the past few cycles of chemotherapy.  Objective:  Vital signs in last 24 hours:  Blood pressure 112/53, pulse 85, temperature 96.8 F (36 C), temperature source Oral, height 6\' 2"  (1.88 m), weight 186 lb 6.4 oz (84.55 kg).    HEENT: No thrush or ulcers Resp: Lungs clear bilaterally Cardio: Regular rate and rhythm GI: No hepatomegaly, nontender Vascular: No leg edema Neuro: The vibratory sense is mildly diminished at the fingertip bilaterally  Skin: Erythema at the palms and soles, no skin breakdown   Portacath/PICC-without erythema  Lab Results:  Lab Results  Component Value Date   WBC 2.9* 08/16/2011   HGB 11.4* 08/16/2011   HCT 32.0* 08/16/2011   MCV 102.6* 08/16/2011   PLT 122* 08/16/2011   ANC 1.6    Medications: I have reviewed the patient's current medications.  Assessment/Plan: 1.Moderately differentiated adenocarcinoma of the cecum, stage IIIB (T4 N1), status post a right colectomy on 04/08/2011 -the tumor was microsatellite stable and K-ras wild-type . He began adjuvant CAPOX chemotherapy on 05/03/2011. He completed cycle 5 on 07/26/2011 (oxaliplatin was held from cycle 4)  2. History of colon polyps  3. Anemia secondary to iron deficiency, recent worsening due to chemotherapy-stable today  4. Postoperative abdominal wall/flank hematoma  5. Sleep apnea  6. History of atrial fibrillation  7. History of esophageal strict  8. Family history of colon cancer and gastric cancer -genetic testing is pending  9. 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 Neutropenia/thrombocytopenia secondary tochemotherapy -improved  Disposition:  He appears stable. The plan is to proceed with cycle 6 of adjuvant chemotherapy today. The neutrophils and platelets are adequate today with a dose reduction of the oxaliplatin starting with cycle 5. He has mild hand/foot syndrome. He will contact us for increased erythema or pain at the hands or feet. Mr. Carswell will return for an office visit and cycle 7 of adjuvant chemotherapy in 3 weeks.   Thornton Papas, MD  08/16/2011  2:06 PM

## 2011-08-23 ENCOUNTER — Ambulatory Visit (INDEPENDENT_AMBULATORY_CARE_PROVIDER_SITE_OTHER): Payer: Medicare Other | Admitting: General Surgery

## 2011-08-23 ENCOUNTER — Encounter (INDEPENDENT_AMBULATORY_CARE_PROVIDER_SITE_OTHER): Payer: Self-pay | Admitting: General Surgery

## 2011-08-23 VITALS — BP 120/72 | HR 88 | Temp 98.2°F | Resp 14 | Ht 74.0 in | Wt 181.2 lb

## 2011-08-23 DIAGNOSIS — Z85038 Personal history of other malignant neoplasm of large intestine: Secondary | ICD-10-CM

## 2011-08-23 NOTE — Patient Instructions (Signed)
Call us when your Port-a-cath can come out.

## 2011-08-23 NOTE — Progress Notes (Signed)
Operation:laparoscopic-assisted right colectomy  Date:April 08, 2011  Pathology:stage III colon cancer  HPI:  He is here for long term follow up of his right colon cancer.  He continues to be treated with chemotherapy and is tolerating this fairly well.  His bowel are moving well.  His energy level is a little low due to the chemotherapy.  Physical Exam: Abd-soft, well healed scar with no hernia or nodules, no hepatomegaly Chest- PAC in place.  Assessment:  Stage III right colon cancer currently undergoing chemotherapy treatments and tolerating these fairly well.  Plan:  Will remove Port-a-cath when Dr. Truett Perna feels it is the appropriate time.  Repeat colonoscopy per Dr. Arlyce Dice.

## 2011-09-02 ENCOUNTER — Encounter: Payer: Self-pay | Admitting: *Deleted

## 2011-09-02 NOTE — Progress Notes (Signed)
RECEIVED A FAX FROM [employer] CONCERNING A CONFIRMATION OF PRESCRIPTION SHIPMENT FOR XELODA ON 09/01/11. 

## 2011-09-05 ENCOUNTER — Other Ambulatory Visit: Payer: Self-pay | Admitting: Oncology

## 2011-09-06 ENCOUNTER — Ambulatory Visit (HOSPITAL_BASED_OUTPATIENT_CLINIC_OR_DEPARTMENT_OTHER): Payer: Medicare Other | Admitting: Nurse Practitioner

## 2011-09-06 ENCOUNTER — Other Ambulatory Visit (HOSPITAL_BASED_OUTPATIENT_CLINIC_OR_DEPARTMENT_OTHER): Payer: Medicare Other | Admitting: Lab

## 2011-09-06 ENCOUNTER — Ambulatory Visit (HOSPITAL_BASED_OUTPATIENT_CLINIC_OR_DEPARTMENT_OTHER): Payer: Medicare Other

## 2011-09-06 ENCOUNTER — Telehealth: Payer: Self-pay | Admitting: Oncology

## 2011-09-06 VITALS — BP 126/59 | HR 80 | Temp 97.5°F | Ht 74.0 in | Wt 185.7 lb

## 2011-09-06 DIAGNOSIS — Z5111 Encounter for antineoplastic chemotherapy: Secondary | ICD-10-CM

## 2011-09-06 DIAGNOSIS — C182 Malignant neoplasm of ascending colon: Secondary | ICD-10-CM

## 2011-09-06 DIAGNOSIS — D6481 Anemia due to antineoplastic chemotherapy: Secondary | ICD-10-CM

## 2011-09-06 DIAGNOSIS — C180 Malignant neoplasm of cecum: Secondary | ICD-10-CM

## 2011-09-06 DIAGNOSIS — D509 Iron deficiency anemia, unspecified: Secondary | ICD-10-CM

## 2011-09-06 DIAGNOSIS — G622 Polyneuropathy due to other toxic agents: Secondary | ICD-10-CM

## 2011-09-06 DIAGNOSIS — H908 Mixed conductive and sensorineural hearing loss, unspecified: Secondary | ICD-10-CM

## 2011-09-06 LAB — COMPREHENSIVE METABOLIC PANEL
ALT: 28 U/L (ref 0–53)
AST: 32 U/L (ref 0–37)
Alkaline Phosphatase: 144 U/L — ABNORMAL HIGH (ref 39–117)
Sodium: 140 mEq/L (ref 135–145)
Total Bilirubin: 1.4 mg/dL — ABNORMAL HIGH (ref 0.3–1.2)
Total Protein: 6.2 g/dL (ref 6.0–8.3)

## 2011-09-06 LAB — CBC WITH DIFFERENTIAL/PLATELET
Basophils Absolute: 0 10*3/uL (ref 0.0–0.1)
EOS%: 2 % (ref 0.0–7.0)
Eosinophils Absolute: 0.1 10*3/uL (ref 0.0–0.5)
HGB: 10.8 g/dL — ABNORMAL LOW (ref 13.0–17.1)
NEUT#: 1.8 10*3/uL (ref 1.5–6.5)
RBC: 3.01 10*6/uL — ABNORMAL LOW (ref 4.20–5.82)
RDW: 19.3 % — ABNORMAL HIGH (ref 11.0–14.6)
lymph#: 0.9 10*3/uL (ref 0.9–3.3)

## 2011-09-06 MED ORDER — OXALIPLATIN CHEMO INJECTION 100 MG/20ML
104.0000 mg/m2 | Freq: Once | INTRAVENOUS | Status: AC
  Administered 2011-09-06: 210 mg via INTRAVENOUS
  Filled 2011-09-06: qty 42

## 2011-09-06 MED ORDER — ONDANSETRON 8 MG/50ML IVPB (CHCC)
8.0000 mg | Freq: Once | INTRAVENOUS | Status: AC
  Administered 2011-09-06: 8 mg via INTRAVENOUS

## 2011-09-06 MED ORDER — DEXTROSE 5 % IV SOLN
Freq: Once | INTRAVENOUS | Status: AC
  Administered 2011-09-06: 14:00:00 via INTRAVENOUS

## 2011-09-06 MED ORDER — SODIUM CHLORIDE 0.9 % IJ SOLN
10.0000 mL | INTRAMUSCULAR | Status: DC | PRN
  Administered 2011-09-06: 10 mL
  Filled 2011-09-06: qty 10

## 2011-09-06 MED ORDER — HEPARIN SOD (PORK) LOCK FLUSH 100 UNIT/ML IV SOLN
500.0000 [IU] | Freq: Once | INTRAVENOUS | Status: AC | PRN
  Administered 2011-09-06: 500 [IU]
  Filled 2011-09-06: qty 5

## 2011-09-06 MED ORDER — DEXAMETHASONE SODIUM PHOSPHATE 10 MG/ML IJ SOLN
10.0000 mg | Freq: Once | INTRAMUSCULAR | Status: AC
  Administered 2011-09-06: 10 mg via INTRAVENOUS

## 2011-09-06 NOTE — Telephone Encounter (Signed)
Gave pt appt for calendar for August 2013 lab, MD, emailed [place] regarding chemo

## 2011-09-06 NOTE — Patient Instructions (Addendum)
[facility] Discharge Instructions for Patients Receiving Chemotherapy  Today you received the following chemotherapy agents Oxaliplatin  To help prevent nausea and vomiting after your treatment, we encourage you to take your nausea medication.   If you develop nausea and vomiting that is not controlled by your nausea medication, call the clinic. If it is after clinic hours your family physician or the after hours number for the clinic or go to the Emergency Department.   BELOW ARE SYMPTOMS THAT SHOULD BE REPORTED IMMEDIATELY:  *FEVER GREATER THAN 100.5 F  *CHILLS WITH OR WITHOUT FEVER  NAUSEA AND VOMITING THAT IS NOT CONTROLLED WITH YOUR NAUSEA MEDICATION  *UNUSUAL SHORTNESS OF BREATH  *UNUSUAL BRUISING OR BLEEDING  TENDERNESS IN MOUTH AND THROAT WITH OR WITHOUT PRESENCE OF ULCERS  *URINARY PROBLEMS  *BOWEL PROBLEMS  UNUSUAL RASH Items with * indicate a potential emergency and should be followed up as soon as possible.  One of the nurses will contact you 24 hours after your treatment. Please let the nurse know about any problems that you may have experienced. Feel free to call the clinic you have any questions or concerns. The clinic phone number is [phone].   I have been informed and understand all the instructions given to me. I know to contact the clinic, my physician, or go to the Emergency Department if any problems should occur. I do not have any questions at this time, but understand that I may call the clinic during office hours   should I have any questions or need assistance in obtaining follow up care.    __________________________________________  _____________  __________ Signature of Patient or Authorized Representative            Date                   Time    __________________________________________ Nurse's Signature

## 2011-09-06 NOTE — Progress Notes (Signed)
OFFICE PROGRESS NOTE  Interval history:  [name] returns as scheduled. He completed cycle 6 CAPOX beginning 08/16/2011. He denies nausea/vomiting. No mouth sores. No diarrhea. He reports stable redness over the palms and soles. He is applying a lotion. No skin breakdown. He reports persistent cold sensitivity. No numbness or tingling in his hands or feet in the absence of cold exposure.   Objective: Blood pressure 126/59, pulse 80, temperature 97.5 F (36.4 C), temperature source Oral, height 6\' 2"  (1.88 m), weight 185 lb 11.2 oz (84.233 kg).  Oropharynx is without thrush or ulceration. Lungs are clear. Regular cardiac rhythm. Port-A-Cath site is without erythema. Abdomen is soft and nontender. No hepatomegaly. Extremities are without edema. Palms are erythematous. No skin breakdown. Vibratory sense is moderately decreased over the fingertips per tuning fork exam.  Lab Results: Lab Results  Component Value Date   WBC 3.5* 09/06/2011   HGB 10.8* 09/06/2011   HCT 31.4* 09/06/2011   MCV 104.3* 09/06/2011   PLT 106* 09/06/2011    Chemistry:    Chemistry      Component Value Date/Time   NA 140 09/06/2011 1144   K 4.0 09/06/2011 1144   CL 107 09/06/2011 1144   CO2 24 09/06/2011 1144   BUN 10 09/06/2011 1144   CREATININE 0.86 09/06/2011 1144      Component Value Date/Time   CALCIUM 9.2 09/06/2011 1144   ALKPHOS 144* 09/06/2011 1144   AST 32 09/06/2011 1144   ALT 28 09/06/2011 1144   BILITOT 1.4* 09/06/2011 1144       Studies/Results: No results found.  Medications: I have reviewed the patient's current medications.  Assessment/Plan:  1.Moderately differentiated adenocarcinoma of the cecum, stage IIIB (T4 N1), status post a right colectomy on 04/08/2011 -the tumor was microsatellite stable and K-ras wild-type . He began adjuvant CAPOX chemotherapy on 05/03/2011. He completed cycle 5 on 07/26/2011 (oxaliplatin was held from cycle 4). He completed cycle 6 beginning 08/16/2011.  2. History  of colon polyps.  3. Anemia secondary to iron deficiency, recent worsening due to chemotherapy. Stable. 4. Postoperative abdominal wall/flank hematoma.  5. Sleep apnea.  6. History of atrial fibrillation.   7. History of esophageal stricture.  8. Family history of colon cancer and gastric cancer. Genetic testing is pending. 9. 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 10. 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  11. Neutropenia/thrombocytopenia secondary to chemotherapy. Improved.  Disposition-[name] appears stable. Plan to proceed with cycle 7 CAPOX today as scheduled. He will return for a followup visit and cycle 8 in 2 weeks. The oxaliplatin may be held with cycle 8 if the neuropathy symptoms have worsened. [name] will contact the office prior to his next visit with any problems.  Plan reviewed with Dr. Truett Perna.   [name] ANP/GNP-BC

## 2011-09-07 ENCOUNTER — Telehealth: Payer: Self-pay | Admitting: *Deleted

## 2011-09-07 NOTE — Telephone Encounter (Signed)
Per staff message and POF I have scheduled appt.  [name]  

## 2011-09-10 ENCOUNTER — Telehealth: Payer: Self-pay | Admitting: Oncology

## 2011-09-10 NOTE — Telephone Encounter (Signed)
Talked to pt and gave him appt for August 19th  2013 lab, MD and chemo

## 2011-09-21 ENCOUNTER — Other Ambulatory Visit: Payer: Self-pay | Admitting: *Deleted

## 2011-09-21 DIAGNOSIS — C182 Malignant neoplasm of ascending colon: Secondary | ICD-10-CM

## 2011-09-21 MED ORDER — CAPECITABINE 500 MG PO TABS: 2000.0000 mg | ORAL_TABLET | Freq: Two times a day (BID) | ORAL | Status: DC

## 2011-09-21 NOTE — Telephone Encounter (Signed)
RECEIVED A FAX FROM [employer] CONCERNING A CONFIRMATION OF FACSIMILE RECEIPT FOR PT.'S REFERRAL. 

## 2011-09-21 NOTE — Addendum Note (Signed)
Addended by: Arvilla Meres on: 09/21/2011 02:27 PM   Modules accepted: Orders

## 2011-09-21 NOTE — Addendum Note (Signed)
Addended by: Arvilla Meres on: 09/21/2011 02:29 PM   Modules accepted: Orders

## 2011-09-21 NOTE — Telephone Encounter (Signed)
THIS REFILL REQUEST FOR XELODA WAS GIVEN TO DR.SHERRILL'S NURSE, SUSAN COWARD,RN. 

## 2011-09-24 NOTE — Telephone Encounter (Signed)
RECEIVED A FAX FROM [employer] CONCERNING A CONFIRMATION OF PRESCRIPTION SHIPMENT FOR XELODA ON 09/23/11. 

## 2011-09-26 ENCOUNTER — Other Ambulatory Visit: Payer: Self-pay | Admitting: Oncology

## 2011-09-27 ENCOUNTER — Telehealth: Payer: Self-pay | Admitting: Oncology

## 2011-09-27 ENCOUNTER — Other Ambulatory Visit (HOSPITAL_BASED_OUTPATIENT_CLINIC_OR_DEPARTMENT_OTHER): Payer: Medicare Other | Admitting: Lab

## 2011-09-27 ENCOUNTER — Ambulatory Visit (HOSPITAL_BASED_OUTPATIENT_CLINIC_OR_DEPARTMENT_OTHER): Payer: Medicare Other

## 2011-09-27 ENCOUNTER — Ambulatory Visit (HOSPITAL_BASED_OUTPATIENT_CLINIC_OR_DEPARTMENT_OTHER): Payer: Medicare Other | Admitting: Oncology

## 2011-09-27 VITALS — BP 121/62 | HR 84 | Temp 97.0°F | Resp 20 | Ht 74.0 in | Wt 189.2 lb

## 2011-09-27 DIAGNOSIS — C182 Malignant neoplasm of ascending colon: Secondary | ICD-10-CM

## 2011-09-27 DIAGNOSIS — H908 Mixed conductive and sensorineural hearing loss, unspecified: Secondary | ICD-10-CM

## 2011-09-27 DIAGNOSIS — C180 Malignant neoplasm of cecum: Secondary | ICD-10-CM

## 2011-09-27 DIAGNOSIS — D649 Anemia, unspecified: Secondary | ICD-10-CM

## 2011-09-27 DIAGNOSIS — D702 Other drug-induced agranulocytosis: Secondary | ICD-10-CM

## 2011-09-27 DIAGNOSIS — Z5111 Encounter for antineoplastic chemotherapy: Secondary | ICD-10-CM

## 2011-09-27 DIAGNOSIS — Z800 Family history of malignant neoplasm of digestive organs: Secondary | ICD-10-CM

## 2011-09-27 LAB — CBC WITH DIFFERENTIAL/PLATELET
BASO%: 0.4 % (ref 0.0–2.0)
EOS%: 2.6 % (ref 0.0–7.0)
MCH: 35 pg — ABNORMAL HIGH (ref 27.2–33.4)
MCHC: 35.9 g/dL (ref 32.0–36.0)
MONO%: 18 % — ABNORMAL HIGH (ref 0.0–14.0)
RBC: 3.06 10*6/uL — ABNORMAL LOW (ref 4.20–5.82)
RDW: 18.4 % — ABNORMAL HIGH (ref 11.0–14.6)
lymph#: 0.9 10*3/uL (ref 0.9–3.3)

## 2011-09-27 LAB — COMPREHENSIVE METABOLIC PANEL
ALT: 30 U/L (ref 0–53)
AST: 36 U/L (ref 0–37)
Albumin: 3.3 g/dL — ABNORMAL LOW (ref 3.5–5.2)
Alkaline Phosphatase: 150 U/L — ABNORMAL HIGH (ref 39–117)
Calcium: 9 mg/dL (ref 8.4–10.5)
Chloride: 107 mEq/L (ref 96–112)
Potassium: 3.6 mEq/L (ref 3.5–5.3)

## 2011-09-27 MED ORDER — ONDANSETRON 8 MG/50ML IVPB (CHCC)
8.0000 mg | Freq: Once | INTRAVENOUS | Status: AC
  Administered 2011-09-27: 8 mg via INTRAVENOUS

## 2011-09-27 MED ORDER — HEPARIN SOD (PORK) LOCK FLUSH 100 UNIT/ML IV SOLN
500.0000 [IU] | Freq: Once | INTRAVENOUS | Status: AC | PRN
Start: 2011-09-27 — End: 2011-09-27
  Administered 2011-09-27: 500 [IU]
  Filled 2011-09-27: qty 5

## 2011-09-27 MED ORDER — OXALIPLATIN CHEMO INJECTION 100 MG/20ML
104.0000 mg/m2 | Freq: Once | INTRAVENOUS | Status: AC
  Administered 2011-09-27: 210 mg via INTRAVENOUS
  Filled 2011-09-27: qty 42

## 2011-09-27 MED ORDER — SODIUM CHLORIDE 0.9 % IJ SOLN
10.0000 mL | INTRAMUSCULAR | Status: DC | PRN
  Administered 2011-09-27: 10 mL
  Filled 2011-09-27: qty 10

## 2011-09-27 MED ORDER — DEXTROSE 5 % IV SOLN
Freq: Once | INTRAVENOUS | Status: AC
  Administered 2011-09-27: 12:00:00 via INTRAVENOUS

## 2011-09-27 MED ORDER — DEXAMETHASONE SODIUM PHOSPHATE 10 MG/ML IJ SOLN
10.0000 mg | Freq: Once | INTRAMUSCULAR | Status: AC
  Administered 2011-09-27: 10 mg via INTRAVENOUS

## 2011-09-27 NOTE — Progress Notes (Signed)
   [facility]    OFFICE PROGRESS NOTE   INTERVAL HISTORY:   He returns as scheduled. He completed another cycle of CAPOX beginning on 09/06/2011. He reports tolerating the chemotherapy well. He denies peripheral numbness and tingling. He has "soreness "at the fingertips and persistent erythema over the hands and feet. He feels the hands are better compared to when he began the last cycle of chemotherapy. No mouth sores or diarrhea.  Objective:  Vital signs in last 24 hours:  Blood pressure 121/62, pulse 84, temperature 97 F (36.1 C), resp. rate 20, height 6\' 2"  (1.88 m), weight 189 lb 3.2 oz (85.821 kg).    HEENT: No thrush or ulcers Resp: Lungs clear bilaterally Cardio: Regular rate and rhythm GI: No hepatomegaly, nontender Vascular: No leg edema Neuro: The vibratory sense is intact versus minimally decreased at the fingertips bilaterally  Skin: There is erythema over the palms and soles with mild dry desquamation. No skin breakdown.   Portacath/PICC-without erythema  Lab Results:  Lab Results  Component Value Date   WBC 2.7* 09/27/2011   HGB 10.7* 09/27/2011   HCT 29.8* 09/27/2011   MCV 97.4 09/27/2011   PLT 97* 09/27/2011   ANC 1.2    Medications: I have reviewed the patient's current medications.  Assessment/Plan: 1.Moderately differentiated adenocarcinoma of the cecum, stage IIIB (T4 N1), status post a right colectomy on 04/08/2011 -the tumor was microsatellite stable and K-ras wild-type . He began adjuvant CAPOX chemotherapy on 05/03/2011. He completed cycle 5 on 07/26/2011 (oxaliplatin was held from cycle 4). He completed cycle 7 beginning 09/06/2011.  2. History of colon polyps.  3. 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 Stable. 4. Postoperative abdominal wall/flank hematoma.  5. Sleep apnea.  6. History of atrial fibrillation.  7. History of esophageal stricture.  8. Family history of colon cancer  and gastric cancer. Extended Genetic testing is pending.  9. 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  11. Neutropenia/thrombocytopenia secondary to chemotherapy.  12. 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 The plan is to complete a final cycle of CAPOX therapy beginning today. The neutrophil count is mildly diminished. He will return for a weekly CBC following this cycle of chemotherapy. The Xeloda will be dose reduced to 1500 mg twice daily with this cycle secondary to hand/foot syndrome. He will discontinue the Xeloda if the hand or foot symptoms progress. He knows to contact us for a fever or bleeding.  Ms. Frappier will return for an office visit in 3 weeks.   Thornton Papas, MD  09/27/2011  11:19 AM

## 2011-09-27 NOTE — Patient Instructions (Signed)
[facility] Discharge Instructions for Patients Receiving Chemotherapy  Today you received the following chemotherapy agents Oxaliplatin  To help prevent nausea and vomiting after your treatment, we encourage you to take your nausea medication.   If you develop nausea and vomiting that is not controlled by your nausea medication, call the clinic. If it is after clinic hours your family physician or the after hours number for the clinic or go to the Emergency Department.   BELOW ARE SYMPTOMS THAT SHOULD BE REPORTED IMMEDIATELY:  *FEVER GREATER THAN 100.5 F  *CHILLS WITH OR WITHOUT FEVER  NAUSEA AND VOMITING THAT IS NOT CONTROLLED WITH YOUR NAUSEA MEDICATION  *UNUSUAL SHORTNESS OF BREATH  *UNUSUAL BRUISING OR BLEEDING  TENDERNESS IN MOUTH AND THROAT WITH OR WITHOUT PRESENCE OF ULCERS  *URINARY PROBLEMS  *BOWEL PROBLEMS  UNUSUAL RASH Items with * indicate a potential emergency and should be followed up as soon as possible.  One of the nurses will contact you 24 hours after your treatment. Please let the nurse know about any problems that you may have experienced. Feel free to call the clinic you have any questions or concerns. The clinic phone number is [phone].   I have been informed and understand all the instructions given to me. I know to contact the clinic, my physician, or go to the Emergency Department if any problems should occur. I do not have any questions at this time, but understand that I may call the clinic during office hours   should I have any questions or need assistance in obtaining follow up care.    __________________________________________  _____________  __________ Signature of Patient or Authorized Representative            Date                   Time    __________________________________________ Nurse's Signature    

## 2011-09-27 NOTE — Telephone Encounter (Signed)
Gave pt appt calendar for August , September 2013 lab and ML

## 2011-09-27 NOTE — Addendum Note (Signed)
Addended by: Caleb Popp on: 09/27/2011 01:37 PM   Modules accepted: Orders

## 2011-10-05 ENCOUNTER — Other Ambulatory Visit (HOSPITAL_BASED_OUTPATIENT_CLINIC_OR_DEPARTMENT_OTHER): Payer: Medicare Other | Admitting: Lab

## 2011-10-05 DIAGNOSIS — C182 Malignant neoplasm of ascending colon: Secondary | ICD-10-CM

## 2011-10-05 LAB — CBC WITH DIFFERENTIAL/PLATELET
BASO%: 0.7 % (ref 0.0–2.0)
Basophils Absolute: 0 10*3/uL (ref 0.0–0.1)
EOS%: 4.1 % (ref 0.0–7.0)
Eosinophils Absolute: 0.1 10*3/uL (ref 0.0–0.5)
HCT: 29.9 % — ABNORMAL LOW (ref 38.4–49.9)
HGB: 10.9 g/dL — ABNORMAL LOW (ref 13.0–17.1)
LYMPH%: 32.1 % (ref 14.0–49.0)
MCH: 34.9 pg — ABNORMAL HIGH (ref 27.2–33.4)
MCHC: 36.5 g/dL — ABNORMAL HIGH (ref 32.0–36.0)
MCV: 95.8 fL (ref 79.3–98.0)
MONO#: 0.5 10*3/uL (ref 0.1–0.9)
MONO%: 16.6 % — ABNORMAL HIGH (ref 0.0–14.0)
NEUT#: 1.4 10*3/uL — ABNORMAL LOW (ref 1.5–6.5)
NEUT%: 46.5 % (ref 39.0–75.0)
Platelets: 80 10*3/uL — ABNORMAL LOW (ref 140–400)
RBC: 3.12 10*6/uL — ABNORMAL LOW (ref 4.20–5.82)
RDW: 16.8 % — ABNORMAL HIGH (ref 11.0–14.6)
WBC: 2.9 10*3/uL — ABNORMAL LOW (ref 4.0–10.3)
lymph#: 0.9 10*3/uL (ref 0.9–3.3)

## 2011-10-12 ENCOUNTER — Other Ambulatory Visit: Payer: Self-pay | Admitting: *Deleted

## 2011-10-12 ENCOUNTER — Other Ambulatory Visit (HOSPITAL_BASED_OUTPATIENT_CLINIC_OR_DEPARTMENT_OTHER): Payer: Medicare Other | Admitting: Lab

## 2011-10-12 DIAGNOSIS — C182 Malignant neoplasm of ascending colon: Secondary | ICD-10-CM

## 2011-10-12 LAB — CBC WITH DIFFERENTIAL/PLATELET
BASO%: 0.6 % (ref 0.0–2.0)
EOS%: 1.8 % (ref 0.0–7.0)
MCH: 37.2 pg — ABNORMAL HIGH (ref 27.2–33.4)
MCHC: 36.3 g/dL — ABNORMAL HIGH (ref 32.0–36.0)
MCV: 102.5 fL — ABNORMAL HIGH (ref 79.3–98.0)
MONO%: 13.2 % (ref 0.0–14.0)
RBC: 2.68 10*6/uL — ABNORMAL LOW (ref 4.20–5.82)
RDW: 18.8 % — ABNORMAL HIGH (ref 11.0–14.6)
lymph#: 0.9 10*3/uL (ref 0.9–3.3)

## 2011-10-12 NOTE — Telephone Encounter (Signed)
THIS REFILL REQUEST FOR XELODA WAS GIVEN TO DR.SHERRILL'S NURSE, AMY HORTON,RN. 

## 2011-10-19 ENCOUNTER — Telehealth: Payer: Self-pay | Admitting: Oncology

## 2011-10-19 ENCOUNTER — Other Ambulatory Visit (HOSPITAL_BASED_OUTPATIENT_CLINIC_OR_DEPARTMENT_OTHER): Payer: Medicare Other | Admitting: Lab

## 2011-10-19 ENCOUNTER — Ambulatory Visit (HOSPITAL_BASED_OUTPATIENT_CLINIC_OR_DEPARTMENT_OTHER): Payer: Medicare Other | Admitting: Nurse Practitioner

## 2011-10-19 VITALS — BP 118/59 | HR 88 | Temp 97.7°F | Resp 18 | Ht 74.0 in | Wt 183.0 lb

## 2011-10-19 DIAGNOSIS — D6481 Anemia due to antineoplastic chemotherapy: Secondary | ICD-10-CM

## 2011-10-19 DIAGNOSIS — D509 Iron deficiency anemia, unspecified: Secondary | ICD-10-CM

## 2011-10-19 DIAGNOSIS — C182 Malignant neoplasm of ascending colon: Secondary | ICD-10-CM

## 2011-10-19 DIAGNOSIS — D702 Other drug-induced agranulocytosis: Secondary | ICD-10-CM

## 2011-10-19 DIAGNOSIS — C180 Malignant neoplasm of cecum: Secondary | ICD-10-CM

## 2011-10-19 LAB — CBC WITH DIFFERENTIAL/PLATELET
Eosinophils Absolute: 0.1 10*3/uL (ref 0.0–0.5)
HGB: 11.1 g/dL — ABNORMAL LOW (ref 13.0–17.1)
MCV: 104.4 fL — ABNORMAL HIGH (ref 79.3–98.0)
MONO#: 0.6 10*3/uL (ref 0.1–0.9)
MONO%: 16.6 % — ABNORMAL HIGH (ref 0.0–14.0)
NEUT#: 1.9 10*3/uL (ref 1.5–6.5)
RBC: 3.04 10*6/uL — ABNORMAL LOW (ref 4.20–5.82)
RDW: 19.6 % — ABNORMAL HIGH (ref 11.0–14.6)
WBC: 3.4 10*3/uL — ABNORMAL LOW (ref 4.0–10.3)
lymph#: 0.9 10*3/uL (ref 0.9–3.3)

## 2011-10-19 NOTE — Progress Notes (Signed)
OFFICE PROGRESS NOTE  Interval history:  [name] returns as scheduled. He completed the eighth and final cycle of CAPOX beginning 09/27/2011. He denies nausea/vomiting. No mouth sores. No diarrhea. He continues to note redness on the feet greater than hands. He denies any numbness in the hands or feet.   Objective: Blood pressure 118/59, pulse 88, temperature 97.7 F (36.5 C), temperature source Oral, resp. rate 18, height 6\' 2"  (1.88 m), weight 183 lb (83.008 kg).  Oropharynx is without thrush or ulceration. No palpable cervical, supraclavicular or axillary lymph nodes. Lungs are clear. Regular cardiac rhythm. Port-A-Cath site is without erythema. Abdomen is soft and nontender. No hepatomegaly. Extremities are without edema. Palms are erythematous. No skin breakdown. Vibratory sense is mildly decreased over the fingertips. Lab Results: Lab Results  Component Value Date   WBC 3.4* 10/19/2011   HGB 11.1* 10/19/2011   HCT 31.7* 10/19/2011   MCV 104.4* 10/19/2011   PLT 88* 10/19/2011    Chemistry:    Chemistry      Component Value Date/Time   NA 141 09/27/2011 0952   K 3.6 09/27/2011 0952   CL 107 09/27/2011 0952   CO2 24 09/27/2011 0952   BUN 8 09/27/2011 0952   CREATININE 0.86 09/27/2011 0952      Component Value Date/Time   CALCIUM 9.0 09/27/2011 0952   ALKPHOS 150* 09/27/2011 0952   AST 36 09/27/2011 0952   ALT 30 09/27/2011 0952   BILITOT 1.5* 09/27/2011 0952       Studies/Results: No results found.  Medications: I have reviewed the patient's current medications.  Assessment/Plan  1.Moderately differentiated adenocarcinoma of the cecum, stage IIIB (T4 N1), status post a right colectomy on 04/08/2011 -the tumor was microsatellite stable and K-ras wild-type . He began adjuvant CAPOX chemotherapy on 05/03/2011. He completed cycle 5 on 07/26/2011 (oxaliplatin was held from cycle 4). He completed cycle 7 beginning 09/06/2011. He completed cycle 8 beginning 09/27/2011. 2. History of  colon polyps.  3. 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 Stable.  4. Postoperative abdominal wall/flank hematoma.  5. Sleep apnea.  6. History of atrial fibrillation.  7. History of esophageal stricture.  8. Family history of colon cancer and gastric cancer. Extended Genetic testing is pending.  9. 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  11. Neutropenia/thrombocytopenia secondary to chemotherapy.   Disposition-[name] appears stable. He completed the eighth and final cycle of CAPOX chemotherapy beginning 09/27/2011. He continues to have mild thrombocytopenia. He will return in 3 weeks for a followup CBC and a CEA. We are referring him to Dr. Abbey Chatters for Port-A-Cath removal. He will return for a followup visit in 3 months. He will contact the office in the interim with any problems.  Plan reviewed Dr. Truett Perna.    Lonna Cobb ANP/GNP-BC

## 2011-10-19 NOTE — Telephone Encounter (Signed)
gv pt appt schedule for October and December. Sent message to [employer] @ [facility] via [employer] (Dr. Vic Ripper assistant) re calling pt for port removal appt.

## 2011-11-01 ENCOUNTER — Telehealth (INDEPENDENT_AMBULATORY_CARE_PROVIDER_SITE_OTHER): Payer: Self-pay | Admitting: General Surgery

## 2011-11-01 NOTE — Telephone Encounter (Signed)
Pt called to state Dr. Truett Perna told him he would contact Dr. Abbey Chatters to remove his PAC.  Last used about six weeks ago now and has not been flushed.  He is aware of a low platelet count on last CBC.  Please advise him of the plan.

## 2011-11-02 ENCOUNTER — Telehealth: Payer: Self-pay | Admitting: Nurse Practitioner

## 2011-11-02 ENCOUNTER — Telehealth (INDEPENDENT_AMBULATORY_CARE_PROVIDER_SITE_OTHER): Payer: Self-pay

## 2011-11-02 NOTE — Telephone Encounter (Signed)
Will let Dr. Abbey Chatters know [facility] is ready to be removed (per Dr. Truett Perna).  Discussed with patient this day.

## 2011-11-02 NOTE — Telephone Encounter (Signed)
[facility] called to confirm that Dr. Truett Perna is ok with pt PAC removal.  Per chart review confirmed that [facility] is ready for removal.  [facility]yn[facility] Numbers stated will schedule.

## 2011-11-03 ENCOUNTER — Telehealth: Payer: Self-pay | Admitting: Oncology

## 2011-11-03 ENCOUNTER — Encounter (HOSPITAL_BASED_OUTPATIENT_CLINIC_OR_DEPARTMENT_OTHER): Payer: Self-pay | Admitting: *Deleted

## 2011-11-03 ENCOUNTER — Other Ambulatory Visit (INDEPENDENT_AMBULATORY_CARE_PROVIDER_SITE_OTHER): Payer: Self-pay | Admitting: General Surgery

## 2011-11-03 NOTE — Progress Notes (Signed)
Pt was to have cbc and cmet at cancer [facility] 9/27-will do here with the pt dr Abbey Chatters wants Sees dr Arlyn Leak for a 1x hx af-no meds

## 2011-11-03 NOTE — Telephone Encounter (Signed)
Pt called r/s appt from 10/1 to 9/30 per pt rqst, due to portacath removal on 11/09/11

## 2011-11-04 NOTE — Progress Notes (Signed)
Dr Jean Rosenthal cleared for surgery

## 2011-11-05 ENCOUNTER — Other Ambulatory Visit (HOSPITAL_BASED_OUTPATIENT_CLINIC_OR_DEPARTMENT_OTHER): Payer: Medicare Other | Admitting: Lab

## 2011-11-05 ENCOUNTER — Encounter (HOSPITAL_BASED_OUTPATIENT_CLINIC_OR_DEPARTMENT_OTHER)
Admission: RE | Admit: 2011-11-05 | Discharge: 2011-11-05 | Disposition: A | Discharge status: Home / Self Care | Payer: Medicare Other | Source: Ambulatory Visit | Attending: General Surgery | Admitting: General Surgery

## 2011-11-05 DIAGNOSIS — C182 Malignant neoplasm of ascending colon: Secondary | ICD-10-CM

## 2011-11-05 LAB — COMPREHENSIVE METABOLIC PANEL
ALT: 34 U/L (ref 0–53)
CO2: 26 mEq/L (ref 19–32)
Calcium: 9.7 mg/dL (ref 8.4–10.5)
Chloride: 105 mEq/L (ref 96–112)
GFR calc Af Amer: >90 mL/min (ref >90)
GFR calc non Af Amer: 83 mL/min — ABNORMAL LOW (ref >90)
Glucose, Bld: 162 mg/dL — ABNORMAL HIGH (ref 70–99)
Sodium: 139 mEq/L (ref 135–145)
Total Bilirubin: 1.3 mg/dL — ABNORMAL HIGH (ref 0.3–1.2)

## 2011-11-05 LAB — CBC WITH DIFFERENTIAL/PLATELET
Eosinophils Relative: 4 % (ref 0–5)
HCT: 33.3 % — ABNORMAL LOW (ref 39.0–52.0)
Lymphocytes Relative: 29 % (ref 12–46)
Lymphs Abs: 0.7 10*3/uL (ref 0.7–4.0)
MCV: 97.1 fL (ref 78.0–100.0)
Monocytes Absolute: 0.3 10*3/uL (ref 0.1–1.0)
Neutro Abs: 1.3 10*3/uL — ABNORMAL LOW (ref 1.7–7.7)
Platelets: 91 10*3/uL — ABNORMAL LOW (ref 150–400)
RBC: 3.43 MIL/uL — ABNORMAL LOW (ref 4.22–5.81)
WBC: 2.3 10*3/uL — ABNORMAL LOW (ref 4.0–10.5)

## 2011-11-09 ENCOUNTER — Encounter (HOSPITAL_BASED_OUTPATIENT_CLINIC_OR_DEPARTMENT_OTHER)
Admission: RE | Disposition: A | Discharge status: Home / Self Care | Payer: Self-pay | Source: Ambulatory Visit | Attending: General Surgery

## 2011-11-09 ENCOUNTER — Ambulatory Visit (HOSPITAL_BASED_OUTPATIENT_CLINIC_OR_DEPARTMENT_OTHER)
Admission: RE | Admit: 2011-11-09 | Discharge: 2011-11-09 | Disposition: A | Discharge status: Home / Self Care | Payer: Medicare Other | Source: Ambulatory Visit | Attending: General Surgery | Admitting: General Surgery

## 2011-11-09 ENCOUNTER — Encounter (HOSPITAL_BASED_OUTPATIENT_CLINIC_OR_DEPARTMENT_OTHER): Payer: Self-pay | Admitting: Anesthesiology

## 2011-11-09 ENCOUNTER — Other Ambulatory Visit: Payer: Medicare Other | Admitting: Lab

## 2011-11-09 ENCOUNTER — Ambulatory Visit (HOSPITAL_BASED_OUTPATIENT_CLINIC_OR_DEPARTMENT_OTHER): Payer: Medicare Other | Admitting: Anesthesiology

## 2011-11-09 ENCOUNTER — Encounter (HOSPITAL_BASED_OUTPATIENT_CLINIC_OR_DEPARTMENT_OTHER): Payer: Self-pay

## 2011-11-09 DIAGNOSIS — Z452 Encounter for adjustment and management of vascular access device: Secondary | ICD-10-CM | POA: Insufficient documentation

## 2011-11-09 DIAGNOSIS — Z85038 Personal history of other malignant neoplasm of large intestine: Secondary | ICD-10-CM | POA: Insufficient documentation

## 2011-11-09 HISTORY — PX: PORT-A-CATH REMOVAL: SHX5289

## 2011-11-09 SURGERY — REMOVAL PORT-A-CATH
Anesthesia: Monitor Anesthesia Care | Site: Chest | Wound class: Clean

## 2011-11-09 MED ORDER — ONDANSETRON HCL 4 MG/2ML IJ SOLN
INTRAMUSCULAR | Status: DC | PRN
  Administered 2011-11-09: 4 mg via INTRAVENOUS

## 2011-11-09 MED ORDER — LIDOCAINE HCL (CARDIAC) 20 MG/ML IV SOLN
INTRAVENOUS | Status: DC | PRN
  Administered 2011-11-09: 50 mg via INTRAVENOUS

## 2011-11-09 MED ORDER — DEXAMETHASONE SODIUM PHOSPHATE 4 MG/ML IJ SOLN
INTRAMUSCULAR | Status: DC | PRN
  Administered 2011-11-09: 10 mg via INTRAVENOUS

## 2011-11-09 MED ORDER — ACETAMINOPHEN 325 MG PO TABS: 650.0000 mg | ORAL_TABLET | ORAL | Status: DC | PRN

## 2011-11-09 MED ORDER — CEFAZOLIN SODIUM-DEXTROSE 2-3 GM-% IV SOLR
2.0000 g | INTRAVENOUS | Status: AC
  Administered 2011-11-09: 2 g via INTRAVENOUS

## 2011-11-09 MED ORDER — ACETAMINOPHEN 650 MG RE SUPP: 650.0000 mg | RECTAL | Status: DC | PRN

## 2011-11-09 MED ORDER — SODIUM CHLORIDE 0.9 % IJ SOLN: 3.0000 mL | INTRAMUSCULAR | Status: DC | PRN

## 2011-11-09 MED ORDER — LIDOCAINE-EPINEPHRINE (PF) 1 %-1:200000 IJ SOLN
INTRAMUSCULAR | Status: DC | PRN
  Administered 2011-11-09: 2 mL

## 2011-11-09 MED ORDER — MORPHINE SULFATE 2 MG/ML IJ SOLN: 2.0000 mg | INTRAMUSCULAR | Status: DC | PRN

## 2011-11-09 MED ORDER — LACTATED RINGERS IV SOLN
INTRAVENOUS | Status: DC
  Administered 2011-11-09: 08:00:00 via INTRAVENOUS

## 2011-11-09 MED ORDER — ONDANSETRON HCL 4 MG/2ML IJ SOLN: 4.0000 mg | Freq: Four times a day (QID) | INTRAMUSCULAR | Status: DC | PRN

## 2011-11-09 MED ORDER — FENTANYL CITRATE 0.05 MG/ML IJ SOLN: 25.0000 ug | INTRAMUSCULAR | Status: DC | PRN

## 2011-11-09 MED ORDER — PROPOFOL 10 MG/ML IV EMUL
INTRAVENOUS | Status: DC | PRN
  Administered 2011-11-09: 100 ug/kg/min via INTRAVENOUS

## 2011-11-09 MED ORDER — FENTANYL CITRATE 0.05 MG/ML IJ SOLN
INTRAMUSCULAR | Status: DC | PRN
  Administered 2011-11-09: 50 ug via INTRAVENOUS
  Administered 2011-11-09: 25 ug via INTRAVENOUS

## 2011-11-09 MED ORDER — OXYCODONE HCL 5 MG PO TABS: 5.0000 mg | ORAL_TABLET | ORAL | Status: DC | PRN

## 2011-11-09 SURGICAL SUPPLY — 35 items
BENZOIN TINCTURE PRP APPL 2/3 (GAUZE/BANDAGES/DRESSINGS) ×2 IMPLANT
BLADE SURG 15 STRL LF DISP TIS (BLADE) ×1 IMPLANT
BLADE SURG 15 STRL SS (BLADE) ×1
CHLORAPREP W/TINT 26ML (MISCELLANEOUS) ×2 IMPLANT
CLEANER CAUTERY TIP 5X5 PAD (MISCELLANEOUS) ×1 IMPLANT
CLOTH BEACON ORANGE TIMEOUT ST (SAFETY) ×2 IMPLANT
COVER MAYO STAND STRL (DRAPES) ×2 IMPLANT
COVER TABLE BACK 60X90 (DRAPES) ×2 IMPLANT
DECANTER SPIKE VIAL GLASS SM (MISCELLANEOUS) ×2 IMPLANT
DRAPE PED LAPAROTOMY (DRAPES) ×2 IMPLANT
DRAPE UTILITY XL STRL (DRAPES) ×2 IMPLANT
DRESSING TELFA 8X3 (GAUZE/BANDAGES/DRESSINGS) ×2 IMPLANT
DRSG TEGADERM 2-3/8X2-3/4 SM (GAUZE/BANDAGES/DRESSINGS) IMPLANT
ELECT REM PT RETURN 9FT ADLT (ELECTROSURGICAL) ×2
ELECTRODE REM PT RTRN 9FT ADLT (ELECTROSURGICAL) ×1 IMPLANT
GLOVE BIO SURGEON STRL SZ 6.5 (GLOVE) ×2 IMPLANT
GLOVE BIOGEL PI IND STRL 8.5 (GLOVE) ×1 IMPLANT
GLOVE BIOGEL PI INDICATOR 8.5 (GLOVE) ×1
GLOVE ECLIPSE 8.0 STRL XLNG CF (GLOVE) ×2 IMPLANT
GLOVE INDICATOR 6.5 STRL GRN (GLOVE) ×4 IMPLANT
GOWN PREVENTION PLUS XLARGE (GOWN DISPOSABLE) ×6 IMPLANT
NEEDLE HYPO 25X1 1.5 SAFETY (NEEDLE) ×2 IMPLANT
PACK BASIN DAY SURGERY FS (CUSTOM PROCEDURE TRAY) ×2 IMPLANT
PAD CLEANER CAUTERY TIP 5X5 (MISCELLANEOUS) ×1
PENCIL BUTTON HOLSTER BLD 10FT (ELECTRODE) ×2 IMPLANT
SLEEVE SCD COMPRESS KNEE MED (MISCELLANEOUS) IMPLANT
SPONGE GAUZE 2X2 8PLY STRL LF (GAUZE/BANDAGES/DRESSINGS) ×2 IMPLANT
STRIP CLOSURE SKIN 1/2X4 (GAUZE/BANDAGES/DRESSINGS) ×2 IMPLANT
SUT MON AB 4-0 PC3 18 (SUTURE) ×2 IMPLANT
SUT VIC AB 4-0 SH 27 (SUTURE) ×1
SUT VIC AB 4-0 SH 27XANBCTRL (SUTURE) ×1 IMPLANT
SYR CONTROL 10ML LL (SYRINGE) ×2 IMPLANT
TOWEL OR 17X24 6PK STRL BLUE (TOWEL DISPOSABLE) ×4 IMPLANT
TOWEL OR NON WOVEN STRL DISP B (DISPOSABLE) ×2 IMPLANT
WATER STERILE IRR 1000ML POUR (IV SOLUTION) ×2 IMPLANT

## 2011-11-09 NOTE — Anesthesia Preprocedure Evaluation (Signed)
Anesthesia Evaluation  Patient identified by MRN, date of birth, ID band Patient awake    Reviewed: Allergy & Precautions, H&P , NPO status , Patient's Chart, lab work & pertinent test results  Airway Mallampati: II  Neck ROM: full    Dental   Pulmonary sleep apnea ,          Cardiovascular + CAD + dysrhythmias Atrial Fibrillation     Neuro/Psych    GI/Hepatic GERD-  ,  Endo/Other    Renal/GU      Musculoskeletal  (+) Arthritis -,   Abdominal   Peds  Hematology   Anesthesia Other Findings   Reproductive/Obstetrics                           Anesthesia Physical Anesthesia Plan  ASA: III  Anesthesia Plan: MAC   Post-op Pain Management:    Induction: Intravenous  Airway Management Planned: Simple Face Mask  Additional Equipment:   Intra-op Plan:   Post-operative Plan:   Informed Consent: I have reviewed the patients History and Physical, chart, labs and discussed the procedure including the risks, benefits and alternatives for the proposed anesthesia with the patient or authorized representative who has indicated his/her understanding and acceptance.     Plan Discussed with: CRNA and Surgeon  Anesthesia Plan Comments:         Anesthesia Quick Evaluation

## 2011-11-09 NOTE — Interval H&P Note (Signed)
History and Physical Interval Note:  11/09/2011 8:55 AM  [name]  has presented today for surgery, with the diagnosis of retained port-a-cath  The various methods of treatment have been discussed with the patient and family. After consideration of risks, benefits and other options for treatment, the patient has consented to  Procedure(s) (LRB) with comments: REMOVAL PORT-A-CATH (N/A) as a surgical intervention .  The patient's history has been reviewed, patient examined, no change in status, stable for surgery.  I have reviewed the patient's chart and labs.  Questions were answered to the patient's satisfaction.     Shameika Speelman Shela Commons

## 2011-11-09 NOTE — Anesthesia Postprocedure Evaluation (Signed)
Anesthesia Post Note  Patient: [name]  Procedure(s) Performed: Procedure(s) (LRB): REMOVAL PORT-A-CATH (N/A)  Anesthesia type: MAC  Patient location: PACU  Post pain: Pain level controlled and Adequate analgesia  Post assessment: Post-op Vital signs reviewed, Patient's Cardiovascular Status Stable and Respiratory Function Stable  Last Vitals:  Filed Vitals:   11/09/11 0931  BP:   Pulse: 79  Temp:   Resp: 13    Post vital signs: Reviewed and stable  Level of consciousness: awake, alert  and oriented  Complications: No apparent anesthesia complications

## 2011-11-09 NOTE — Transfer of Care (Signed)
Immediate Anesthesia Transfer of Care Note  Patient: [name]  Procedure(s) Performed: Procedure(s) (LRB) with comments: REMOVAL PORT-A-CATH (N/A)  Patient Location: PACU  Anesthesia Type: MAC  Level of Consciousness: awake and alert   Airway & Oxygen Therapy: Patient Spontanous Breathing and Patient connected to face mask oxygen  Post-op Assessment: Report given to PACU RN and Post -op Vital signs reviewed and stable  Post vital signs: Reviewed and stable  Complications: No apparent anesthesia complications

## 2011-11-09 NOTE — H&P (Signed)
[name] is an 71 y.o. male.   Chief Complaint:   Retained Port-A-Cath HPI: He has stage III colon cancer. He has completed his chemotherapy and no longer needs his Port-A-Cath. He presents for Port-A-Cath removal.  Past Medical History  Diagnosis Date  . Diverticulosis     Sigmoid colon  . Personal history of colonic polyps 09/17/2009    Tubular adenoma  . History of hemorrhoids   . Hyperlipidemia   . Anemia   . 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 Sleep apnea 03-31-11    dx. 2-3 yrs ago, uses cap nightly  . GERD (gastroesophageal reflux disease) 03-31-11    tx. Omeprazole  . Sleep apnea     dx 2010     . Cancer     colon  . Atrial fibrillation 2006    Dr Franki Monte af-no meds    Past Surgical History  Procedure Date  . Inguinal hernia repair   . Cad 2004    Dr Donnie Aho  . Colon surgery 04/08/2011    lap patial colectomy  . Cardiac catheterization     around 2004  by Dr Donnie Aho  . Portacath placement 04/30/2011    Procedure: INSERTION PORT-A-CATH;  Surgeon: Adolph Pollack, MD;  Location: [facility] OR;  Service: General;  Laterality: Right;    Family History  Problem Relation Age of Onset  . Diabetes Father   . Prostate cancer Father   . Heart attack Father 57  . Heart failure Mother     Congestive heart failure  . Colon cancer Brother   . Cancer Brother     colon  . Stomach cancer Sister   . Coronary artery disease Brother     CABG  . Anesthesia problems Neg Hx    Social History:  reports that he quit smoking about 50 years ago. He has never used smokeless tobacco. He reports that he does not drink alcohol or use illicit drugs.  Allergies: No Known Allergies  Medications Prior to Admission  Medication Sig Dispense Refill  . amphetamine-dextroamphetamine (ADDERALL) 10 MG tablet Take 20 mg by mouth daily.       Marland Kitchen aspirin 81 MG tablet Take 81 mg by mouth daily.      . capecitabine (XELODA) 500 MG tablet Take 1,500 mg by mouth 2 (two) times daily after a meal.  X 14 days on, 7 day rest      . docusate sodium (COLACE) 100 MG capsule Take 100 mg by mouth 2 (two) times daily. Unsure of dose      . ferrous sulfate 325 (65 FE) MG tablet Take 1 tablet (325 mg total) by mouth 3 (three) times daily with meals.  90 tablet  3  . fish oil-omega-3 fatty acids 1000 MG capsule Take 1 g by mouth daily.        . niacin 500 MG tablet Take 1,000 mg by mouth daily with breakfast.       . omeprazole (PRILOSEC) 10 MG capsule Take 1 capsule (10 mg total) by mouth 2 (two) times daily.  180 capsule  3  . saw palmetto 160 MG capsule Take 160 mg by mouth daily.        . simvastatin (ZOCOR) 20 MG tablet Take 1 tablet (20 mg total) by mouth at bedtime.  100 tablet  3  . thiamine (VITAMIN B-1) 100 MG tablet Take 100 mg by mouth daily.        . sildenafil (VIAGRA) 100  MG tablet Take 1 tablet (100 mg total) by mouth daily as needed.  6 tablet  11    No results found for this or any previous visit (from the past 48 hour(s)). No results found.  Review of Systems  Constitutional: Negative for fever and chills.  HENT: Negative for congestion.   Gastrointestinal: Negative for nausea, vomiting and abdominal pain.    Blood pressure 122/56, pulse 75, temperature 97.6 F (36.4 C), temperature source Oral, resp. rate 20, height 6\' 2"  (1.88 m), weight 180 lb 12.8 oz (82.01 kg), SpO2 100.00%. Physical Exam  Constitutional: He appears well-developed and well-nourished. No distress.  HENT:  Head: Normocephalic and atraumatic.  Neck: Neck supple.       Catheter seen going into the right neck in the subcutaneous tissue  Cardiovascular: Normal rate and regular rhythm.   Respiratory: Effort normal and breath sounds normal.       Port-A-Cath in right upper chest wall  GI: Soft. He exhibits no mass. There is no tenderness.       Well-healed scars  Musculoskeletal: He exhibits no edema.     Assessment/Plan Retained Port-A-Cath. Has some leukopenia and some thrombocytopenia but I do not  think these contraindicate Port-A-Cath removal.  Plan: Port-A-Cath removal. Procedure, risks, and after care were explained to him.  Gid Schoffstall J 11/09/2011, 8:53 AM

## 2011-11-09 NOTE — Op Note (Signed)
PREOPERATIVE DIAGNOSIS:  Retain Port-a-cath.  POSTOPERATIVE DIAGNOSIS:  Same  PROCEDURE:    Porta-cath removal  SURGEON:  Avel Peace, M.D.  ANESTHESIA:  Local (Xylocaine) with MAC  INDICATION:  This is a 71 year old male with stage III cancer who has received chemotherapy via the Porta-cath.  The Porta-cath is no longer needed.  He now presents for removal.  The procedure, risks, and aftercare have been explained preoperatively.   The right upper chest wall and neck were sterilely prepped and draped. Local anesthetic was infiltrated at the site of the port in the chest wall superficially and deep. The previous scar was incised sharply and then using electrocautery the subcutaneous tissue was divided until the port and catheter were identified.  The fibrous sheath was dissected free from the catheter and it was pulled out of the internal jugular vein. Direct pressure was held over the vein site for 10-15 minutes.  The port was then dissected free from the chest wall using electrocautery. The port and catheter were removed intact. The chest wall area was inspected and bleeding controlled with electrocautery. Once hemostasis was adequate, the chest wall incision was closed in 2 layers. The subcutaneous tissues approximated with running 4-0 Vicryl suture. The skin was closed with a running 4-0 Monocryl subcuticular stitch. Steri-Strips and a sterile dressing were applied.  He tolerated the procedure well without any apparent complications and was taken to the recovery room in satisfactory condition.

## 2011-11-10 ENCOUNTER — Encounter (HOSPITAL_BASED_OUTPATIENT_CLINIC_OR_DEPARTMENT_OTHER): Payer: Self-pay | Admitting: General Surgery

## 2011-11-11 ENCOUNTER — Telehealth: Payer: Self-pay | Admitting: *Deleted

## 2011-11-11 NOTE — Telephone Encounter (Signed)
Patient called with c/o swelling to the hands/feet.  Advised the patient, due to cardiac history, to contact Cardiologist or PCP with c/o swelling.  Patient also concerned about WBC count.  Advised this could be delayed effects of chemotherapy.  Advised of good handwashing techniques, and take precautions if around anyone that is ill.

## 2011-11-15 ENCOUNTER — Other Ambulatory Visit: Payer: Self-pay | Admitting: Cardiology

## 2011-11-15 NOTE — Progress Notes (Signed)
[name]  Date of visit:  11/15/2011 DOB:  17-Jul-1940    Age:  71 yrs. Medical record number:  [id number]     Account number:  [id number] Primary Care Provider: TODD, JEFFREY A ____________________________ CURRENT DIAGNOSES  1. Edema  2. CAD,Native  3. Hyperlipidemia  4. Long Term Use of Other Medicine  5. Arrhythmia-Atrial Fibrillation  6. Sleep apnea  7. Personal History Of Malignant Neoplasm Of Large Intestine ____________________________ ALLERGIES  NKDA ____________________________ MEDICATIONS  1. Niaspan Extended-Release 500 mg Tab, 2 p.o. daily  2. simvastatin 20 mg tablet, 1/2 tab daily  3. saw palmetto 160 mg capsule, 1 p.o. daily  4. Fish Oil 1,000 mg capsule, BID  5. Amphetamine Salt Combo 10 mg tablet, 2 qd  6. omeprazole 10 mg capsule,delayed release(DR/EC), 1 p.o. daily  7. Viagra 100 mg tablet, PRN  8. ferrous sulfate 325 mg (65 mg iron) tablet, TID  9. Vitamin B-100 Complex tablet, 1 p.o. daily ____________________________ CHIEF COMPLAINTS  Leg edema ____________________________ HISTORY OF PRESENT ILLNESS  Patient seen early for evaluation of edema. Since he was previously here he has completed his 8 cycles of CAPOX chemotherapy and had his Port-A-Cath removed. He evidently has had hand and foot syndrome due to chemotherapy and also has had some drop in his platelets. He complains of easy bruisability. He is noted worsening peripheral edema over the past few weeks and has asked to be seen. It is dependent edema and gets worse toward the end of the day. He does not have PND orthopnea. He does not have any claudication. The best I can tell he has not had significant atrial fibrillation. He denies angina and has not really had any salt intake nonsteroidal use or other things that could cause edema.  ____________________________ PAST HISTORY  Past Medical Illnesses:  sleep apnea uses CPAP, nephrolithiasis, hyperlipidemia, esophageal stricture in past, colon cancer  treated wtih surgery, Xeloda and platinum;  Cardiovascular Illnesses:  mild CAD, atrial fibrillation-paroxysmal;  Surgical Procedures:  inguinal herniorrhaphy-left, laparascopic right colectomy for cancer;  Cardiology Procedures-Invasive:  cardiac cath (left) June 2004;  Cardiology Procedures-Noninvasive:  echocardiogram December 2006, event monitor December 2006, treadmill cardiolite August 2012;  Cardiac Cath Results:  normal Left main, scattered irregularities LAD, 30% stenosis proximal Diag 1, scattered irregularities CFX, scattered irregularities RCA;  LVEF of 66% documented via nuclear study on 09/11/2010   CHADS Score:  1   CHA2DS2-VASC Score:  2 ____________________________ CARDIO-PULMONARY TEST DATES EKG Date:  11/15/2011;   Cardiac Cath Date:  07/30/2002;  Holter/Event Monitor Date: 01/25/2005;  Nuclear Study Date:  10/01/2010;   Echocardiography Date: 02/05/2005;  Chest Xray Date: 07/30/2002;   ____________________________ SOCIAL HISTORY Alcohol Use:  no alcohol use;  Smoking:  used to smoke but quit Prior to 1980;  Diet:  regular diet;  Lifestyle:  married, 1 daughter and 1 son;  Exercise:  some exercise and treadmill;  Occupation:  retired;  Residence:  lives with wife;  Job Description:  [employer];   ____________________________ REVIEW OF SYSTEMS General:  denies recent weight change, fatique or change in exercise tolerance.Eyes:  cataracts  Ears, Nose, Throat, Mouth:  sinusitis, partial hearing loss Respiratory:  denies dyspnea, cough, wheezing or hemoptysis. Cardiovascular:  please review HPI  Abdominal:  denies dyspepsia, GI bleeding, constipation, or diarrhea  Genitourinary-Male:  occasional incontinence, urgency  Musculoskeletal:  see HPI  Endocrine:  glucose intolerance ____________________________ PHYSICAL EXAMINATION VITAL SIGNS  Blood Pressure:  110/60 Sitting, Left arm, regular cuff  ,  110/66 Standing, Left arm and regular cuff   Pulse:  80/min. Weight:  182.00 lbs.  Height:  74"BMI: 23  Constitutional:  pleasant white male in no acute distress, looks stated age Skin:  warm and dry to touch, no apparent skin lesions, or masses noted. Head:  normocephalic, normal hair pattern, no masses or tenderness ENT:  ears, nose and throat reveal no gross abnormalities.  Dentition good. Neck:  supple, no masses, thyromegaly, JVD. Carotid pulses are full and equal bilaterally without bruits. Chest:  normal symmetry, clear to auscultation and percussion. Cardiac:  regular rhythm, normal S1 and S2, No S3 or S4, no murmurs, gallops or rubs detected. Abdomen:  abdomen soft,non-tender, no masses, no hepatospenomegaly, or aneurysm noted Peripheral Pulses:  the femoral,dorsalis pedis, and posterior tibial pulses are full and equal bilaterally with no bruits auscultated. Extremities & Back:  1+ edema, mild bilateral venous insufficiency changes present Neurological:  no gross motor or sensory deficits noted, affect appropriate, oriented x3. ____________________________ MOST RECENT LIPID PANEL 09/28/11  CHOL TOTL 136 mg/dl, LDL 46 calc, HDL 58 mg/dl, TRIGLYCER 829 mg/dl and CHOL/HDL 2.3 (Calc) ____________________________ IMPRESSIONS/PLAN  1. Peripheral edema with some evidence of venous stasis but in a patient with cancer and previous chemotherapy has risk factors for DVT and also could have chemotherapy-induced cardiac issues 2. Coronary artery disease mild 3. Paroxysmal atrial fibrillation 4. Hyperlipidemia under treatment  Recommendations:  Obtain venous Dopplers x-ray lab as noted below and echocardiogram. Followup following the testing below. ____________________________ TODAYS ORDERS  1. D-dimer: today  2. BNP: Today  3. 2D, color flow, doppler: First Available  4. CHEST XRAY: Today  5. Bil L.E. Venous Doppler DVT: At Patient Convenience  6. 12 Lead EKG: Today                       ____________________________ Cardiology Physician:  Darden Palmer  MD [facility]

## 2011-11-17 ENCOUNTER — Other Ambulatory Visit: Payer: Self-pay | Admitting: Cardiology

## 2011-11-17 ENCOUNTER — Encounter (INDEPENDENT_AMBULATORY_CARE_PROVIDER_SITE_OTHER): Payer: Medicare Other | Admitting: Vascular Surgery

## 2011-11-17 ENCOUNTER — Ambulatory Visit
Admission: RE | Admit: 2011-11-17 | Discharge: 2011-11-17 | Disposition: A | Discharge status: Home / Self Care | Payer: Medicare Other | Source: Ambulatory Visit | Attending: Cardiology | Admitting: Cardiology

## 2011-11-17 DIAGNOSIS — R609 Edema, unspecified: Secondary | ICD-10-CM

## 2011-11-17 NOTE — Progress Notes (Unsigned)
Bilateral lower extremity venous duplex performed @ [facility] 11/17/2011

## 2011-11-30 ENCOUNTER — Encounter (INDEPENDENT_AMBULATORY_CARE_PROVIDER_SITE_OTHER): Payer: Self-pay | Admitting: General Surgery

## 2011-11-30 ENCOUNTER — Ambulatory Visit (INDEPENDENT_AMBULATORY_CARE_PROVIDER_SITE_OTHER): Payer: Medicare Other | Admitting: General Surgery

## 2011-11-30 ENCOUNTER — Other Ambulatory Visit (INDEPENDENT_AMBULATORY_CARE_PROVIDER_SITE_OTHER): Payer: Self-pay | Admitting: General Surgery

## 2011-11-30 VITALS — BP 136/80 | HR 74 | Temp 97.0°F | Resp 18 | Ht 74.0 in | Wt 180.3 lb

## 2011-11-30 DIAGNOSIS — Z85038 Personal history of other malignant neoplasm of large intestine: Secondary | ICD-10-CM

## 2011-11-30 NOTE — Patient Instructions (Signed)
We will call you with your results.

## 2011-11-30 NOTE — Progress Notes (Signed)
Subjective:     Patient ID: [name], male   DOB: 09-13-1940, 71 y.o.   MRN: [id number]  HPI  He is here for followup after his Port-A-Cath removal. He has had no problems with that. He has noticed a firm tender nodule to the left of his limited lower midline incision. This is a new finding.   Review of Systems  No blood in his stool or change in bowel habits.     Objective:   Physical Exam Gen.-he looks well and is in no acute distress.  Chest-right upper chest scar is clean and intact.  Abdomen-there is a 3 cm firm fixed nodule just to the left of the upper portion of the limited lower midline incision.  It does not feel like a hernia.    Assessment:     Stage III right colon cancer status post Port-A-Cath removal. He has a new abdominal wall nodule adjacent to the lower midline scar.      Plan:     Check CMP and a CEA levels. Check abdominal and pelvic CT. He may need a large core needle biopsy of this area if it is a solid mass. This has been discussed with him.

## 2011-12-02 LAB — COMPREHENSIVE METABOLIC PANEL
Alkaline Phosphatase: 161 U/L — ABNORMAL HIGH (ref 39–117)
BUN: 11 mg/dL (ref 6–23)
Glucose, Bld: 119 mg/dL — ABNORMAL HIGH (ref 70–99)
Total Bilirubin: 1.1 mg/dL (ref 0.3–1.2)

## 2011-12-03 ENCOUNTER — Ambulatory Visit
Admission: RE | Admit: 2011-12-03 | Discharge: 2011-12-03 | Disposition: A | Discharge status: Home / Self Care | Payer: Medicare Other | Source: Ambulatory Visit | Attending: General Surgery | Admitting: General Surgery

## 2011-12-03 DIAGNOSIS — Z85038 Personal history of other malignant neoplasm of large intestine: Secondary | ICD-10-CM

## 2011-12-03 MED ORDER — IOHEXOL 300 MG/ML  SOLN
100.0000 mL | Freq: Once | INTRAMUSCULAR | Status: AC | PRN
  Administered 2011-12-03: 100 mL via INTRAVENOUS

## 2011-12-06 ENCOUNTER — Encounter (INDEPENDENT_AMBULATORY_CARE_PROVIDER_SITE_OTHER): Payer: Self-pay | Admitting: General Surgery

## 2011-12-06 NOTE — Progress Notes (Signed)
Patient ID: [name], male   DOB: 1940/11/25, 71 y.o.   MRN: [id number] I spoke with him today regarding his results. His CEA level is normal. The CT scan of the abdomen and pelvis demonstrates that the periumbilical mass is consistent with a small incisional hernia that contains fatty tissue. He also has some small gallstones and an enlarged prostate. He knows about his enlarged prostate. I told him he could resume all his normal activities. If her hernia became more bothersome or if it got larger I told him to call the office and make an appointment so we can discuss repair.

## 2012-01-17 ENCOUNTER — Ambulatory Visit (HOSPITAL_BASED_OUTPATIENT_CLINIC_OR_DEPARTMENT_OTHER): Payer: Medicare Other | Admitting: Lab

## 2012-01-17 ENCOUNTER — Ambulatory Visit (HOSPITAL_BASED_OUTPATIENT_CLINIC_OR_DEPARTMENT_OTHER): Payer: Medicare Other | Admitting: Oncology

## 2012-01-17 ENCOUNTER — Telehealth: Payer: Self-pay | Admitting: *Deleted

## 2012-01-17 ENCOUNTER — Telehealth: Payer: Self-pay | Admitting: Oncology

## 2012-01-17 VITALS — BP 120/63 | HR 83 | Temp 97.9°F | Resp 18 | Ht 74.0 in | Wt 178.6 lb

## 2012-01-17 DIAGNOSIS — G620 Drug-induced polyneuropathy: Secondary | ICD-10-CM

## 2012-01-17 DIAGNOSIS — C182 Malignant neoplasm of ascending colon: Secondary | ICD-10-CM

## 2012-01-17 DIAGNOSIS — Z800 Family history of malignant neoplasm of digestive organs: Secondary | ICD-10-CM

## 2012-01-17 DIAGNOSIS — C180 Malignant neoplasm of cecum: Secondary | ICD-10-CM

## 2012-01-17 DIAGNOSIS — D696 Thrombocytopenia, unspecified: Secondary | ICD-10-CM

## 2012-01-17 LAB — CBC WITH DIFFERENTIAL/PLATELET
Basophils Absolute: 0 10*3/uL (ref 0.0–0.1)
Eosinophils Absolute: 0.2 10*3/uL (ref 0.0–0.5)
HGB: 12.8 g/dL — ABNORMAL LOW (ref 13.0–17.1)
LYMPH%: 26.7 % (ref 14.0–49.0)
MCV: 91.3 fL (ref 79.3–98.0)
MONO%: 9.1 % (ref 0.0–14.0)
NEUT#: 2 10*3/uL (ref 1.5–6.5)
Platelets: 85 10*3/uL — ABNORMAL LOW (ref 140–400)
RDW: 13.7 % (ref 11.0–14.6)

## 2012-01-17 NOTE — Telephone Encounter (Signed)
appts made and printed for pt pt aware that he will get a letter with the scan appt and the pt was sent back to the lab

## 2012-01-17 NOTE — Progress Notes (Signed)
   [facility]    OFFICE PROGRESS NOTE   INTERVAL HISTORY:   He returns as scheduled. He reports numbness in the fingers and feet. This does not interfere with activity. The Port-A-Cath was removed by Dr. Abbey Chatters. When he saw Dr. Suzzanne Cloud on 11/30/2011 8 firm nodule was noted to the left of the upper portion of the midline incision. He noted this area and it has not changed.  He was referred for a CT of the abdomen and pelvis on 12/03/2011. Low-attenuation liver lesions are stable and consistent with a benign process such as cyst. No new hepatic lesion. A periumbilical hernia containing only fat was noted. No mass or adenopathy.  Objective:  Vital signs in last 24 hours:  Blood pressure 120/63, pulse 83, temperature 97.9 F (36.6 C), temperature source Oral, resp. rate 18, height 6\' 2"  (1.88 m), weight 178 lb 9.6 oz (81.012 kg).    HEENT: Neck without mass Lymphatics: No cervical, supra-clavicular, axillary, or inguinal nodes Resp: Lungs clear bilaterally Cardio: Regular rate and rhythm GI: No hepatosplenomegaly, to the left of the upper portion of the midline scar and near the umbilicus there is a firm rounded abdominal wall mass measuring 2-3 cm. No associated tenderness. Vascular: No leg edema   Lab Results:  Lab Results  Component Value Date   WBC 3.5* 01/17/2012   HGB 12.8* 01/17/2012   HCT 36.5* 01/17/2012   MCV 91.3 01/17/2012   PLT 85* 01/17/2012   ANC 2.0 CEA on 11/30/2011-0.9  Medications: I have reviewed the patient's current medications.  Assessment/Plan: 1.Moderately differentiated adenocarcinoma of the cecum, stage IIIB (T4 N1), status post a right colectomy on 04/08/2011 -the tumor was microsatellite stable and K-ras wild-type . He began adjuvant CAPOX chemotherapy on 05/03/2011. He completed cycle 5 on 07/26/2011 (oxaliplatin was held from cycle 4). He completed cycle 7 beginning 09/06/2011. He completed cycle 8 beginning 09/27/2011.  2.  History of colon polyps.  3. 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 The hemoglobin is higher-he will discontinue iron. 4. Postoperative abdominal wall/flank hematoma.  5. Sleep apnea.  6. History of atrial fibrillation.  7. History of esophageal stricture.  8. Family history of colon cancer and gastric cancer. The tumor was microsatellite stable and no mutation was found in the mismatch repair genes. A CancerNext panel has not been resulted. 9. 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  11. 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 He continues to have moderate thrombocytopenia, likely secondary to chemotherapy. 12. Abdominal wall hernia noted on a CT of the abdomen 12/03/2011  Disposition:  Mr. Mcisaac remains in clinical remission from colon cancer. He underwent a restaging CT of the abdomen and pelvis in October. He will be scheduled for a CEA and chest CT in March of 2014. Mr. Rendell will schedule a one-year surveillance colonoscopy with Dr. Arlyce Dice.  He will discontinue iron therapy. The persistent thrombocytopenia is likely related to chemotherapy. We will check a CBC when he returns in March.   Thornton Papas, MD  01/17/2012  4:12 PM

## 2012-01-17 NOTE — Telephone Encounter (Signed)
Called and spoke with pt; Per Dr. Truett Perna stop iron, platelets low and will re-check in March.  Pt verbalized understanding and will call if any problems.

## 2012-01-18 ENCOUNTER — Telehealth: Payer: Self-pay | Admitting: Genetic Counselor

## 2012-01-18 NOTE — Telephone Encounter (Signed)
Revealed negative CancerNext panel testing.

## 2012-01-19 ENCOUNTER — Encounter: Payer: Self-pay | Admitting: Genetic Counselor

## 2012-01-21 ENCOUNTER — Telehealth: Payer: Self-pay | Admitting: *Deleted

## 2012-01-21 NOTE — Telephone Encounter (Signed)
Per request of Dr. Truett Perna, [facility] testing is to be performed on  Accession #: [id number] from pathology dated 04/08/11.  This request was called to pathology, spoke with Javata Epps.

## 2012-02-07 ENCOUNTER — Other Ambulatory Visit (INDEPENDENT_AMBULATORY_CARE_PROVIDER_SITE_OTHER): Payer: Medicare Other

## 2012-02-07 DIAGNOSIS — Z0000 Encounter for general adult medical examination without abnormal findings: Secondary | ICD-10-CM

## 2012-02-07 LAB — LIPID PANEL
Cholesterol: 122 mg/dL (ref 0–200)
Triglycerides: 54 mg/dL (ref 0.0–149.0)

## 2012-02-07 LAB — BASIC METABOLIC PANEL
BUN: 12 mg/dL (ref 6–23)
GFR: 77.55 mL/min (ref >60.00)
Glucose, Bld: 112 mg/dL — ABNORMAL HIGH (ref 70–99)
Potassium: 4.7 mEq/L (ref 3.5–5.1)

## 2012-02-07 LAB — POCT URINALYSIS DIPSTICK
Glucose, UA: NEGATIVE
Leukocytes, UA: NEGATIVE
Nitrite, UA: NEGATIVE
Spec Grav, UA: 1.02
Urobilinogen, UA: 0.2

## 2012-02-07 LAB — PSA: PSA: 2.34 ng/mL (ref 0.10–4.00)

## 2012-02-07 LAB — HEPATIC FUNCTION PANEL: Total Bilirubin: 1.4 mg/dL — ABNORMAL HIGH (ref 0.3–1.2)

## 2012-02-07 LAB — TSH: TSH: 2.02 u[IU]/mL (ref 0.35–5.50)

## 2012-02-08 LAB — CBC WITH DIFFERENTIAL/PLATELET
Basophils Absolute: 0 10*3/uL (ref 0.0–0.1)
Eosinophils Absolute: 0.2 10*3/uL (ref 0.0–0.7)
HCT: 38.5 % — ABNORMAL LOW (ref 39.0–52.0)
Lymphs Abs: 1.1 10*3/uL (ref 0.7–4.0)
MCHC: 34.8 g/dL (ref 30.0–36.0)
Monocytes Absolute: 0.3 10*3/uL (ref 0.1–1.0)
Monocytes Relative: 7.2 % (ref 3.0–12.0)
Platelets: 88 10*3/uL — ABNORMAL LOW (ref 150.0–400.0)
RDW: 14.2 % (ref 11.5–14.6)

## 2012-02-15 ENCOUNTER — Encounter: Payer: Self-pay | Admitting: Family Medicine

## 2012-02-15 ENCOUNTER — Ambulatory Visit (INDEPENDENT_AMBULATORY_CARE_PROVIDER_SITE_OTHER): Payer: Medicare Other | Admitting: Family Medicine

## 2012-02-15 VITALS — BP 140/70 | Temp 97.5°F | Ht 72.25 in | Wt 176.0 lb

## 2012-02-15 DIAGNOSIS — N400 Enlarged prostate without lower urinary tract symptoms: Secondary | ICD-10-CM

## 2012-02-15 DIAGNOSIS — K219 Gastro-esophageal reflux disease without esophagitis: Secondary | ICD-10-CM

## 2012-02-15 DIAGNOSIS — Z0000 Encounter for general adult medical examination without abnormal findings: Secondary | ICD-10-CM

## 2012-02-15 DIAGNOSIS — E785 Hyperlipidemia, unspecified: Secondary | ICD-10-CM

## 2012-02-15 DIAGNOSIS — C182 Malignant neoplasm of ascending colon: Secondary | ICD-10-CM

## 2012-02-15 DIAGNOSIS — I2510 Atherosclerotic heart disease of native coronary artery without angina pectoris: Secondary | ICD-10-CM

## 2012-02-15 DIAGNOSIS — R7309 Other abnormal glucose: Secondary | ICD-10-CM

## 2012-02-15 DIAGNOSIS — N529 Male erectile dysfunction, unspecified: Secondary | ICD-10-CM

## 2012-02-15 DIAGNOSIS — H908 Mixed conductive and sensorineural hearing loss, unspecified: Secondary | ICD-10-CM

## 2012-02-15 MED ORDER — SILDENAFIL CITRATE 100 MG PO TABS: 100.0000 mg | ORAL_TABLET | Freq: Every day | ORAL | Status: DC | PRN

## 2012-02-15 MED ORDER — SILDENAFIL CITRATE 100 MG PO TABS
100.0000 mg | ORAL_TABLET | Freq: Every day | ORAL | Status: DC | PRN
Start: 2012-02-15 — End: 2013-04-10

## 2012-02-15 MED ORDER — SIMVASTATIN 20 MG PO TABS: 20.0000 mg | ORAL_TABLET | Freq: Every day | ORAL | Status: DC

## 2012-02-15 MED ORDER — OMEPRAZOLE 10 MG PO CPDR: 10.0000 mg | DELAYED_RELEASE_CAPSULE | Freq: Two times a day (BID) | ORAL | Status: DC

## 2012-02-15 NOTE — Patient Instructions (Signed)
Continue your current medications  Followup in 1 year sooner if any problems 

## 2012-02-15 NOTE — Progress Notes (Signed)
Subjective:    Patient ID: [name], male    DOB: [birth year], 72 y.o.   MRN: [id number]  HPI [name] is a 72 year old married male nonsmoker who comes in today for a Medicare wellness examination because of a history of BPH with outlet obstruction, reflux esophagitis, erectile dysfunction, hyperlipidemia, history of coronary disease, and a new problem of status post colon resection for colon cancer. I saw him last on 01/15/2011. At that time his general physical examination was normal except his hemoglobin was low. Subsequent workup showed a colon cancer he had it resected by Dr. Armanda Heritage. He's currently seeing Dr. Cleotis Nipper for oncology and chemotherapy followup. He states he is doing well  During his chemotherapy he developed peripheral edema. Dr. Cleotis Nipper sent him to his cardiologist Dr. Satira Sark he did a cardio workup including echocardiogram. All that was normal and peripheral edema subsequently resolved spontaneously.  Vaccinations up-to-date except he needs a tetanus booster  His medication reviewed in detail he takes 30 mg of Adderall daily from his psychiatrist Dr. Brendia Sacks  He also takes omeprazole 10 mg daily for reflux, Viagra when necessary for ED, and Zocor 20 mg daily for hyperlipidemia. He takes a number of over-the-counter supplements which I asked him to stop because it really of no value. He also takes a baby aspirin daily. He says he has a recent CT scan that showed gallstones and enlarged prostate and a thickwalled bladder otherwise no recurrence of the colon cancer. His initial surgery went well he had 2 positive nodes.  He gets routine eye care, dental care, bilateral hearing aids,  Cognitive function normal he walks on a regular basis home health safety reviewed no issues identified, no guns in the house, he does have a health care power of attorney and living well   Review of Systems  Constitutional: Negative.   HENT: Negative.   Eyes: Negative.   Respiratory: Negative.     Cardiovascular: Negative.   Gastrointestinal: Negative.   Genitourinary: Negative.   Musculoskeletal: Negative.   Skin: Negative.   Neurological: Negative.   Hematological: Negative.   Psychiatric/Behavioral: Negative.        Objective:   Physical Exam  Constitutional: He is oriented to person, place, and time. He appears well-developed and well-nourished.  HENT:  Head: Normocephalic and atraumatic.  Right Ear: External ear normal.  Left Ear: External ear normal.  Nose: Nose normal.  Mouth/Throat: Oropharynx is clear and moist.  Eyes: Conjunctivae normal and EOM are normal. Pupils are equal, round, and reactive to light.  Neck: Normal range of motion. Neck supple. No JVD present. No tracheal deviation present. No thyromegaly present.  Cardiovascular: Normal rate, regular rhythm, normal heart sounds and intact distal pulses.  Exam reveals no gallop and no friction rub.   No murmur heard.      No carotid bruits peripheral pulses 3+ out of 4 and symmetrical  Pulmonary/Chest: Effort normal and breath sounds normal. No stridor. No respiratory distress. He has no wheezes. He has no rales. He exhibits no tenderness.  Abdominal: Soft. Bowel sounds are normal. He exhibits no distension and no mass. There is no tenderness. There is no rebound and no guarding.  Genitourinary: Rectum normal and penis normal. Guaiac negative stool. No penile tenderness.  Musculoskeletal: Normal range of motion. He exhibits no edema and no tenderness.  Lymphadenopathy:    He has no cervical adenopathy.  Neurological: He is alert and oriented to person, place, and time. He has normal reflexes. No cranial nerve  deficit. He exhibits normal muscle tone.  Skin: Skin is warm and dry. No rash noted. No erythema. No pallor.       Total body skin exam normal except for scar right upper anterior chest wall from previous Port-A-Cath numerous freckles moles seborrheic keratosis all of which appear benign  Psychiatric: He  has a normal mood and affect. His behavior is normal. Judgment and thought content normal.          Assessment & Plan:  Reflux esophagitis continue Prilosec 10 mg daily  Erectile dysfunction refill Viagra  Hyperlipidemia continue Zocor 20 mg daily and an aspirin tablet  Recent history of colon cancer with surgery and postop chemotherapy followup by Dr. Cleotis Nipper  History of coronary artery disease asymptomatic  BPH asymptomatic  Gallstones by CT scan asymptomatic therefore no therapy at this juncture  X. smoker

## 2012-02-21 ENCOUNTER — Encounter: Payer: Self-pay | Admitting: Gastroenterology

## 2012-03-14 ENCOUNTER — Ambulatory Visit (AMBULATORY_SURGERY_CENTER): Payer: Medicare Other | Admitting: *Deleted

## 2012-03-14 ENCOUNTER — Encounter: Payer: Self-pay | Admitting: Gastroenterology

## 2012-03-14 VITALS — Ht 74.0 in | Wt 172.8 lb

## 2012-03-14 DIAGNOSIS — Z1211 Encounter for screening for malignant neoplasm of colon: Secondary | ICD-10-CM

## 2012-03-14 DIAGNOSIS — Z85038 Personal history of other malignant neoplasm of large intestine: Secondary | ICD-10-CM

## 2012-03-14 MED ORDER — NA SULFATE-K SULFATE-MG SULF 17.5-3.13-1.6 GM/177ML PO SOLN: ORAL | Status: DC

## 2012-03-24 ENCOUNTER — Encounter: Payer: Medicare Other | Admitting: Gastroenterology

## 2012-03-28 ENCOUNTER — Ambulatory Visit (AMBULATORY_SURGERY_CENTER): Payer: Medicare Other | Admitting: Gastroenterology

## 2012-03-28 ENCOUNTER — Encounter: Payer: Self-pay | Admitting: Gastroenterology

## 2012-03-28 VITALS — BP 114/62 | HR 67 | Temp 99.2°F | Resp 22 | Ht 74.0 in | Wt 172.0 lb

## 2012-03-28 DIAGNOSIS — D126 Benign neoplasm of colon, unspecified: Secondary | ICD-10-CM

## 2012-03-28 DIAGNOSIS — Z85038 Personal history of other malignant neoplasm of large intestine: Secondary | ICD-10-CM

## 2012-03-28 DIAGNOSIS — Z1211 Encounter for screening for malignant neoplasm of colon: Secondary | ICD-10-CM

## 2012-03-28 MED ORDER — SODIUM CHLORIDE 0.9 % IV SOLN: 500.0000 mL | INTRAVENOUS | Status: DC

## 2012-03-28 NOTE — Progress Notes (Signed)
Called to room to assist during endoscopic procedure.  Patient ID and intended procedure confirmed with present staff. Received instructions for my participation in the procedure from the performing physician.  

## 2012-03-28 NOTE — Progress Notes (Signed)
A/o x 3 pleased report to April RN 

## 2012-03-28 NOTE — Op Note (Signed)
[facility] [address] [place], [address]   COLONOSCOPY PROCEDURE REPORT  PATIENT: [name], [name]  MR#: [id number] BIRTHDATE: September 26, 1940 , 71  yrs. old GENDER: Male ENDOSCOPIST: Louis Meckel, MD REFERRED BY: PROCEDURE DATE:  03/28/2012 PROCEDURE:   Colonoscopy with biopsy ASA CLASS:   Class II INDICATIONS:High risk patient with personal history of colon cancer.  MEDICATIONS: MAC sedation, administered by CRNA and Propofol (Diprivan) 220 mg IV  DESCRIPTION OF PROCEDURE:   After the risks benefits and alternatives of the procedure were thoroughly explained, informed consent was obtained.  A digital rectal exam revealed no abnormalities of the rectum.   The LB [id number]  endoscope was introduced through the anus and advanced to the surgical anastomosis. No adverse events experienced.   The quality of the prep was Suprep good  The instrument was then slowly withdrawn as the colon was fully examined.      COLON FINDINGS: At the anastomosis along a suture line there was for a prominent polyploid appearing mucosa.  Biopsies were taken.   At the anastomosis along a suture line there was for a prominent polyploid appearing mucosa.  Biopsies were taken.   Mild diverticulosis was noted in the sigmoid colon.   The colon mucosa was otherwise normal.  Retroflexed views revealed no abnormalities. The time to cecum=3 minutes 30 seconds.  Withdrawal time=6 minutes 53 seconds.  The scope was withdrawn and the procedure completed. COMPLICATIONS: There were no complications.  ENDOSCOPIC IMPRESSION: 1.   At the anastomosis along a suture line there was for a prominent polyploid appearing mucosa.  Biopsies were taken. . 3.   Mild diverticulosis was noted in the sigmoid colon 4.   The colon mucosa was otherwise normal  RECOMMENDATIONS: Await biopsy results   eSigned:  Louis Meckel, MD 03/28/2012 4:07 PM   cc: Rolm Baptise, MD and Roderick Pee, MD

## 2012-03-28 NOTE — Progress Notes (Signed)
Patient did not experience any of the following events: a burn prior to discharge; a fall within the facility; wrong site/side/patient/procedure/implant event; or a hospital transfer or hospital admission upon discharge from the facility. (G8907) Patient did not have preoperative order for IV antibiotic SSI prophylaxis. (G8918)  

## 2012-03-28 NOTE — Patient Instructions (Addendum)

## 2012-03-29 ENCOUNTER — Telehealth: Payer: Self-pay | Admitting: *Deleted

## 2012-03-29 NOTE — Telephone Encounter (Signed)
  Follow up Call-  Call back number 03/28/2012 03/03/2011 01/25/2011  Post procedure Call Back phone  # [phone] ok to leave message. [phone] message ok  Permission to leave phone message Yes - -     Patient questions:  Do you have a fever, pain , or abdominal swelling? no Pain Score  0 *  Have you tolerated food without any problems? yes  Have you been able to return to your normal activities? yes  Do you have any questions about your discharge instructions: Diet   no Medications  no Follow up visit  no  Do you have questions or concerns about your Care? no  Actions: * If pain score is 4 or above: No action needed, pain <4.

## 2012-04-05 ENCOUNTER — Encounter: Payer: Self-pay | Admitting: Gastroenterology

## 2012-04-11 ENCOUNTER — Telehealth: Payer: Self-pay | Admitting: Gastroenterology

## 2012-04-11 NOTE — Telephone Encounter (Signed)
Spoke with pt and he is aware of results. Letter mailed to pt.

## 2012-04-24 ENCOUNTER — Ambulatory Visit (HOSPITAL_COMMUNITY): Payer: Medicare Other

## 2012-04-24 ENCOUNTER — Other Ambulatory Visit: Payer: Medicare Other | Admitting: Lab

## 2012-04-25 ENCOUNTER — Other Ambulatory Visit: Payer: Medicare Other

## 2012-04-27 ENCOUNTER — Telehealth: Payer: Self-pay | Admitting: Oncology

## 2012-04-27 NOTE — Telephone Encounter (Signed)
Pt called today and states he appts were moved and he did not know. Pt r/s ct to 3/28 and had lb time adjusted and 4/3 f/u moved to 4/8 due to Thursdays are not good for him. Pt has new time for lb 3/28 @ 11am and BS 4/8 @ 9am.

## 2012-04-28 ENCOUNTER — Telehealth: Payer: Self-pay | Admitting: Oncology

## 2012-04-28 ENCOUNTER — Ambulatory Visit: Payer: Medicare Other | Admitting: Oncology

## 2012-04-28 NOTE — Telephone Encounter (Signed)
Talked to pt's wife and she ia ware of all appts lab, Ct and MD on March and April 2014

## 2012-05-05 ENCOUNTER — Encounter (HOSPITAL_COMMUNITY): Payer: Self-pay

## 2012-05-05 ENCOUNTER — Other Ambulatory Visit (HOSPITAL_BASED_OUTPATIENT_CLINIC_OR_DEPARTMENT_OTHER): Payer: Medicare Other | Admitting: Lab

## 2012-05-05 ENCOUNTER — Ambulatory Visit (HOSPITAL_COMMUNITY)
Admission: RE | Admit: 2012-05-05 | Discharge: 2012-05-05 | Disposition: A | Discharge status: Home / Self Care | Payer: Medicare Other | Source: Ambulatory Visit | Attending: Oncology | Admitting: Oncology

## 2012-05-05 DIAGNOSIS — K8020 Calculus of gallbladder without cholecystitis without obstruction: Secondary | ICD-10-CM | POA: Insufficient documentation

## 2012-05-05 DIAGNOSIS — Q619 Cystic kidney disease, unspecified: Secondary | ICD-10-CM | POA: Insufficient documentation

## 2012-05-05 DIAGNOSIS — R911 Solitary pulmonary nodule: Secondary | ICD-10-CM | POA: Insufficient documentation

## 2012-05-05 DIAGNOSIS — K7689 Other specified diseases of liver: Secondary | ICD-10-CM | POA: Insufficient documentation

## 2012-05-05 DIAGNOSIS — C182 Malignant neoplasm of ascending colon: Secondary | ICD-10-CM

## 2012-05-05 DIAGNOSIS — C180 Malignant neoplasm of cecum: Secondary | ICD-10-CM

## 2012-05-05 DIAGNOSIS — C189 Malignant neoplasm of colon, unspecified: Secondary | ICD-10-CM | POA: Insufficient documentation

## 2012-05-05 DIAGNOSIS — I2510 Atherosclerotic heart disease of native coronary artery without angina pectoris: Secondary | ICD-10-CM | POA: Insufficient documentation

## 2012-05-05 LAB — BASIC METABOLIC PANEL (CC13)
BUN: 10.8 mg/dL (ref 7.0–26.0)
CO2: 26 mEq/L (ref 22–29)
Chloride: 107 mEq/L (ref 98–107)
Glucose: 117 mg/dl — ABNORMAL HIGH (ref 70–99)
Potassium: 4.3 mEq/L (ref 3.5–5.1)

## 2012-05-05 MED ORDER — IOHEXOL 300 MG/ML  SOLN
80.0000 mL | Freq: Once | INTRAMUSCULAR | Status: AC | PRN
  Administered 2012-05-05: 80 mL via INTRAVENOUS

## 2012-05-11 ENCOUNTER — Ambulatory Visit: Payer: Medicare Other | Admitting: Oncology

## 2012-05-16 ENCOUNTER — Other Ambulatory Visit (HOSPITAL_BASED_OUTPATIENT_CLINIC_OR_DEPARTMENT_OTHER): Payer: Medicare Other | Admitting: Lab

## 2012-05-16 ENCOUNTER — Ambulatory Visit (HOSPITAL_BASED_OUTPATIENT_CLINIC_OR_DEPARTMENT_OTHER): Payer: Medicare Other | Admitting: Oncology

## 2012-05-16 ENCOUNTER — Telehealth: Payer: Self-pay | Admitting: Oncology

## 2012-05-16 VITALS — BP 128/70 | HR 82 | Temp 97.0°F | Resp 18 | Ht 74.0 in | Wt 175.7 lb

## 2012-05-16 DIAGNOSIS — C182 Malignant neoplasm of ascending colon: Secondary | ICD-10-CM

## 2012-05-16 DIAGNOSIS — D696 Thrombocytopenia, unspecified: Secondary | ICD-10-CM

## 2012-05-16 DIAGNOSIS — Z85038 Personal history of other malignant neoplasm of large intestine: Secondary | ICD-10-CM

## 2012-05-16 LAB — CBC WITH DIFFERENTIAL/PLATELET
BASO%: 0.6 % (ref 0.0–2.0)
EOS%: 2.6 % (ref 0.0–7.0)
HCT: 36.6 % — ABNORMAL LOW (ref 38.4–49.9)
MCH: 29.9 pg (ref 27.2–33.4)
MCHC: 35.2 g/dL (ref 32.0–36.0)
MONO#: 0.3 10*3/uL (ref 0.1–0.9)
NEUT%: 62.1 % (ref 39.0–75.0)
RBC: 4.32 10*6/uL (ref 4.20–5.82)
RDW: 14 % (ref 11.0–14.6)
WBC: 3.5 10*3/uL — ABNORMAL LOW (ref 4.0–10.3)
lymph#: 1 10*3/uL (ref 0.9–3.3)

## 2012-05-16 NOTE — Telephone Encounter (Signed)
gv and printed appt schedule for pt for OCT....gv pt barium...sent pt back to the lab

## 2012-05-16 NOTE — Progress Notes (Signed)
   [facility]    OFFICE PROGRESS NOTE   INTERVAL HISTORY:   He returns as scheduled. He continues to have numbness in the fingertips and feet. This does not interfere with activity. No other complaint.  Objective:  Vital signs in last 24 hours:  Blood pressure 128/70, pulse 82, temperature 97 F (36.1 C), temperature source Oral, resp. rate 18, height 6\' 2"  (1.88 m), weight 175 lb 11.2 oz (79.697 kg).    HEENT: Neck without mass Lymphatics: No cervical, supra-clavicular, axillary, or inguinal nodes Resp: Lungs clear bilaterally Cardio: Regular rate and rhythm GI: No hepatosplenomegaly, nontender, firm 2-3 cm rounded area at the midline scar inferior to the umbilicus. Vascular: No leg edema   Lab Results:  Lab Results  Component Value Date   WBC 3.5* 05/16/2012   HGB 12.9* 05/16/2012   HCT 36.6* 05/16/2012   MCV 84.7 05/16/2012   PLT 82* 05/16/2012   ANC 2.1  CEA on 04/27/2012-1.1  X-rays: CT the chest on 04/27/2012-3 mm left upper lobe and right upper lobe nodules are unchanged compared to March of 2013. No suspicious pulmonary nodules. No evidence of metastatic disease in the chest.  Medications: I have reviewed the patient's current medications.  Assessment/Plan: 1.Moderately differentiated adenocarcinoma of the cecum, stage IIIB (T4 N1), status post a right colectomy on 04/08/2011 -the tumor was microsatellite stable and K-ras wild-type . He began adjuvant CAPOX chemotherapy on 05/03/2011. He completed cycle 5 on 07/26/2011 (oxaliplatin was held from cycle 4). He completed cycle 7 beginning 09/06/2011. He completed cycle 8 beginning 09/27/2011.  2. History of colon polyps. He underwent a surveillance colonoscopy by Dr. Arlyce Dice in February 2014 with benign findings 3. 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 The hemoglobin is higher-he will discontinue iron.  4. Postoperative abdominal wall/flank hematoma.  5. Sleep  apnea.  6. History of atrial fibrillation.  7. History of esophageal stricture.  8. Family history of colon cancer and gastric cancer. The tumor was microsatellite stable and no mutation was found in the mismatch repair genes. A CancerNext panel has not been resulted.  9. 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  11. 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 He continues to have moderate thrombocytopenia, likely secondary to chemotherapy.  12. Abdominal wall hernia noted on a CT of the abdomen 12/03/2011   Disposition:  He remains in clinical remission from colon cancer. He will return for an office visit, CEA, and surveillance CT scans in 6 months.  Mr. Antigua has persistent thrombocytopenia. 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 I will review the peripheral blood smear. He will contact us for spontaneous bleeding or bruising. We will ask him to return for a CBC in 3 months.   Thornton Papas, MD  05/16/2012  3:23 PM

## 2012-05-24 ENCOUNTER — Telehealth: Payer: Self-pay | Admitting: *Deleted

## 2012-05-24 NOTE — Telephone Encounter (Signed)
Patient notified of platelet results from 05/16/12 per Dr Truett Perna request. Dr Truett Perna feels this is from chemo, but will have repeat CBC in 3 months and will consider additional evaluation if lower at that time.

## 2012-07-26 ENCOUNTER — Other Ambulatory Visit: Payer: Self-pay | Admitting: *Deleted

## 2012-07-26 DIAGNOSIS — C182 Malignant neoplasm of ascending colon: Secondary | ICD-10-CM

## 2012-07-27 ENCOUNTER — Telehealth: Payer: Self-pay | Admitting: Oncology

## 2012-07-27 NOTE — Telephone Encounter (Signed)
s.w. pt and advised on 7.16.14 lab only appt...pt ok and aware

## 2012-08-14 ENCOUNTER — Telehealth: Payer: Self-pay | Admitting: Oncology

## 2012-08-14 NOTE — Telephone Encounter (Signed)
Pt called, returned call and pt wants to r/s labs from 7/16 to 08/16/12

## 2012-08-16 ENCOUNTER — Other Ambulatory Visit (HOSPITAL_BASED_OUTPATIENT_CLINIC_OR_DEPARTMENT_OTHER): Payer: Medicare Other | Admitting: Lab

## 2012-08-16 DIAGNOSIS — Z85038 Personal history of other malignant neoplasm of large intestine: Secondary | ICD-10-CM

## 2012-08-16 DIAGNOSIS — D696 Thrombocytopenia, unspecified: Secondary | ICD-10-CM

## 2012-08-16 DIAGNOSIS — C182 Malignant neoplasm of ascending colon: Secondary | ICD-10-CM

## 2012-08-16 LAB — CBC WITH DIFFERENTIAL/PLATELET
BASO%: 0.4 % (ref 0.0–2.0)
Basophils Absolute: 0 10*3/uL (ref 0.0–0.1)
EOS%: 2.9 % (ref 0.0–7.0)
HCT: 36 % — ABNORMAL LOW (ref 38.4–49.9)
HGB: 12.7 g/dL — ABNORMAL LOW (ref 13.0–17.1)
MCH: 29.8 pg (ref 27.2–33.4)
MONO#: 0.3 10*3/uL (ref 0.1–0.9)
NEUT%: 53.6 % (ref 39.0–75.0)
RDW: 14.2 % (ref 11.0–14.6)
WBC: 3.7 10*3/uL — ABNORMAL LOW (ref 4.0–10.3)
lymph#: 1.3 10*3/uL (ref 0.9–3.3)

## 2012-08-17 ENCOUNTER — Telehealth: Payer: Self-pay | Admitting: *Deleted

## 2012-08-17 DIAGNOSIS — D696 Thrombocytopenia, unspecified: Secondary | ICD-10-CM

## 2012-08-17 NOTE — Telephone Encounter (Signed)
Message copied by Caleb Popp on Thu Aug 17, 2012  1:03 PM ------      Message from: Thornton Papas B      Created: Wed Aug 16, 2012  9:13 PM       Please call patient, stable thrombocytpenia, f/u as scheduled, ask lab to make blood smear ------

## 2012-08-23 ENCOUNTER — Other Ambulatory Visit: Payer: Medicare Other

## 2012-09-08 ENCOUNTER — Encounter: Payer: Self-pay | Admitting: Oncology

## 2012-09-14 ENCOUNTER — Telehealth: Payer: Self-pay | Admitting: Oncology

## 2012-09-14 NOTE — Telephone Encounter (Signed)
pt requested not to have est appt on thursdays...moved pt aware and ok

## 2012-09-15 ENCOUNTER — Telehealth: Payer: Self-pay | Admitting: Oncology

## 2012-09-15 NOTE — Telephone Encounter (Signed)
s.w. pt wife and advised on 10.10.14 est appt move to 10.14.14 ok and aware they have my chart

## 2012-10-13 ENCOUNTER — Encounter: Payer: Self-pay | Admitting: Cardiology

## 2012-10-13 NOTE — Progress Notes (Signed)
Patient ID: [name], male   DOB: 16-Sep-1940, 72 y.o.   MRN: [id number]   [name]  Date of visit:  10/13/2012 DOB:  02-12-1940    Age:  72 yrs. Medical record number:  [id number]     Account number:  [id number] Primary Care Provider: TODD, JEFFREY [name] ____________________________ CURRENT DIAGNOSES  1. CAD,Native  2. Hyperlipidemia  3. Long Term Use of Other Medicine  4. Arrhythmia-Atrial Fibrillation  5. Chronic venous insufficiency  6. Sleep apnea  7. Personal History Of Malignant Neoplasm Of Large Intestine ____________________________ ALLERGIES  No Known Drug Allergies ____________________________ MEDICATIONS  1. Niaspan Extended-Release 500 mg Tab, 2 p.o. daily  2. simvastatin 20 mg tablet, 1/2 tab daily  3. Amphetamine Salt Combo 10 mg tablet, 2 qd  4. omeprazole 10 mg capsule,delayed release(DR/EC), 1 p.o. daily  5. Viagra 100 mg tablet, PRN  6. aspirin 81 mg tablet,chewable, 1 p.o. daily ____________________________ CHIEF COMPLAINTS  Followup of CAD ____________________________ HISTORY OF PRESENT ILLNESS  Patient seen for cardiac followup. He has had [name] good year since he was previously here. He is exercising on [name] regular basis and his lipids were checked today and show excellent control. He denies angina and has no PND, orthopnea, syncope, palpitations, or claudication. He has completed cancer treatment and states he is doing well. He has had no recurrence of edema or atrial fibrillation and does complain of some mild numbness involving the feet on both legs. He states this is due to chemotherapy. Previous workup of the edema showed mild venous insufficiency. ____________________________ PAST HISTORY  Past Medical Illnesses:  sleep apnea uses CPAP, nephrolithiasis, hyperlipidemia, esophageal stricture in past, colon cancer treated wtih surgery, Xeloda and platinum;  Cardiovascular Illnesses:  mild CAD, atrial fibrillation-paroxysmal;  Surgical Procedures:  inguinal  herniorrhaphy-left, laparascopic right colectomy for cancer;  Cardiology Procedures-Invasive:  cardiac cath (left) June 2004;  Cardiology Procedures-Noninvasive:  echocardiogram December 2006, event monitor December 2006, treadmill cardiolite August 2012, echocardiogram October 2013;  Cardiac Cath Results:  normal Left main, scattered irregularities LAD, 30% stenosis proximal Diag 1, scattered irregularities CFX, scattered irregularities RCA;  LVEF of 55% documented via echocardiogram on 11/17/2011,  CHADS Score:  1,  CHA2DS2-VASC Score:  2 ____________________________ CARDIO-PULMONARY TEST DATES EKG Date:  11/15/2011;   Cardiac Cath Date:  07/30/2002;  Holter/Event Monitor Date: 01/25/2005;  Nuclear Study Date:  10/01/2010;  Echocardiography Date: 11/17/2011;  Chest Xray Date: 11/17/2011;   ____________________________ SOCIAL HISTORY Alcohol Use:  no alcohol use;  Smoking:  used to smoke but quit Prior to 1980;  Diet:  regular diet;  Lifestyle:  married, 1 daughter and 1 son;  Exercise:  some exercise and treadmill;  Occupation:  retired;  Residence:  lives with wife;  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 Eyes: cataracts Respiratory: denies dyspnea, cough, wheezing or hemoptysis. Cardiovascular:  please review HPI Abdominal: denies dyspepsia, GI bleeding, constipation, or diarrhea Genitourinary-Male: occasional incontinence, urgency Neurological:  peripheral neuropathy Endocrine: glucose intolerance  ____________________________ PHYSICAL EXAMINATION VITAL SIGNS  Blood Pressure:  120/70 Sitting, Right arm, regular cuff   Pulse:  68/min. Weight:  179.00 lbs. Height:  74"BMI: 23  Constitutional:  pleasant white male in no acute distress, looks stated age Skin:  warm and dry to touch, no apparent skin lesions, or masses noted. Head:  normocephalic, normal hair pattern,  no masses or tenderness ENT:  ears, nose and  throat reveal no gross abnormalities.  Dentition good. Neck:  supple, no masses, thyromegaly, JVD. Carotid pulses are full and equal bilaterally without bruits. Chest:  normal symmetry, clear to auscultation and percussion. Cardiac:  regular rhythm, normal S1 and S2, No S3 or S4, no murmurs, gallops or rubs detected. Abdomen:  abdomen soft,non-tender, no masses, no hepatospenomegaly, or aneurysm noted Peripheral Pulses:  the femoral,dorsalis pedis, and posterior tibial pulses are full and equal bilaterally with no bruits auscultated. Extremities & Back:  mild bilateral venous insufficiency changes present, no edema present Neurological:  no gross motor or sensory deficits noted, affect appropriate, oriented x3. ____________________________ MOST RECENT LIPID PANEL 10/13/12  CHOL TOTL 114 mg/dl, LDL 55 calc, HDL 41 mg/dl, TRIGLYCER 93 mg/dl and CHOL/HDL 2.8 (Calc) ____________________________ IMPRESSIONS/PLAN  1. Coronary artery disease treated medically 2. Transient atrial fibrillation at the time of his previous surgery for cancer 3. Hyperlipidemia under excellent control 4. Mild chronic venous insufficiency with no edema  Recommendations:  The edema that he had previously has improved. His previous echo showed preserved LV systolic function. He is clinically doing well with excellent control of his lipid status. Recommended followup in one year and call if problems. ____________________________ TODAYS ORDERS  1. Return Visit: 1 year                       ____________________________ Cardiology Physician:  Russell Palmer MD [facility]

## 2012-10-28 IMAGING — CT CT CHEST W/O CM
2 of 4 series · 15 of 36 positions shown, 18 images · non-contrast
Comparison: Plain film of 03/31/2011.  No prior CT of the chest.
Abdominal pelvic CT 03/05/2011 is reviewed.

CLINICAL DATA: Colon cancer diagnosed [DATE].  Colon resection.  No
chest complaints.  Chemotherapy.  .

CT CHEST WITHOUT CONTRAST
TECHNIQUE: Multidetector CT imaging of the chest was performed
following the standard protocol without IV contrast.

[Series 2: chest w/o st · axial · non-contrast · 0.74mm/px · z∈[-74,+201]mm · 12 of 66 slices shown, 15 images]
[im 6/66  mediastinal]
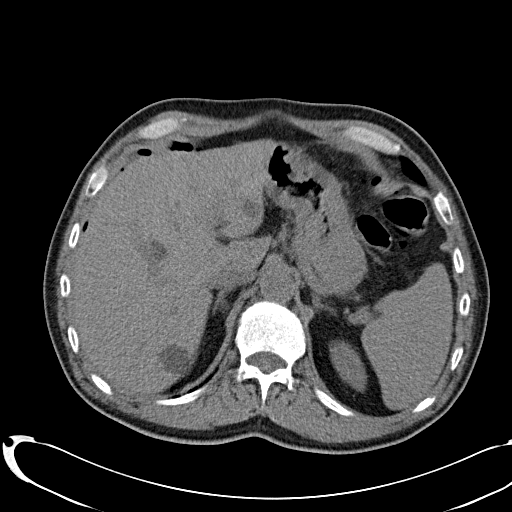
[im 6/66  lung]
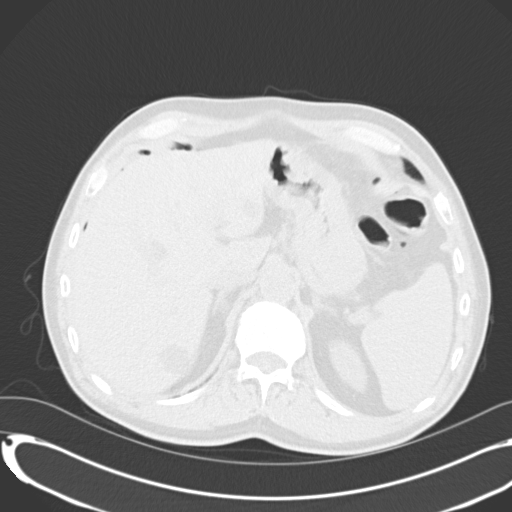
[im 11/66  lung]
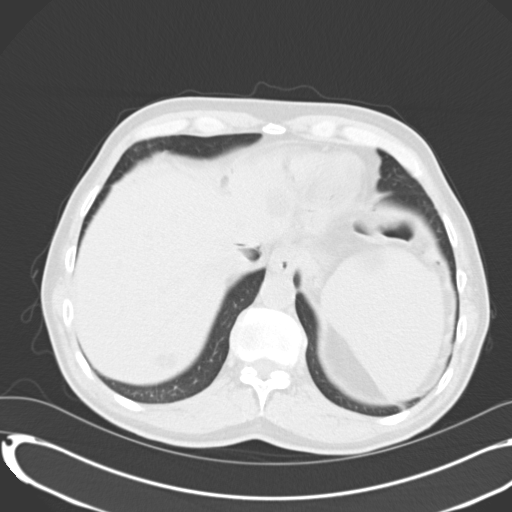
[im 16/66  lung]
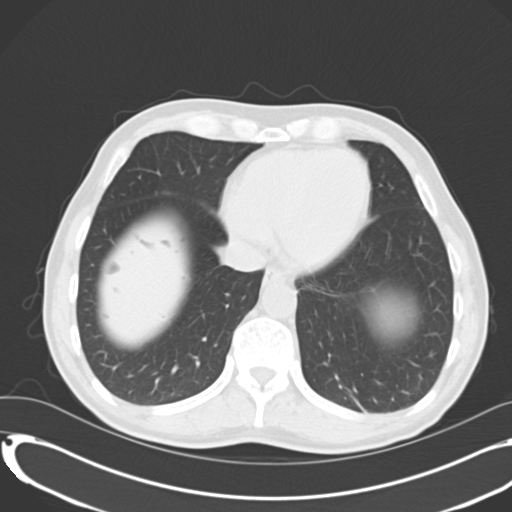
[im 21/66  lung]
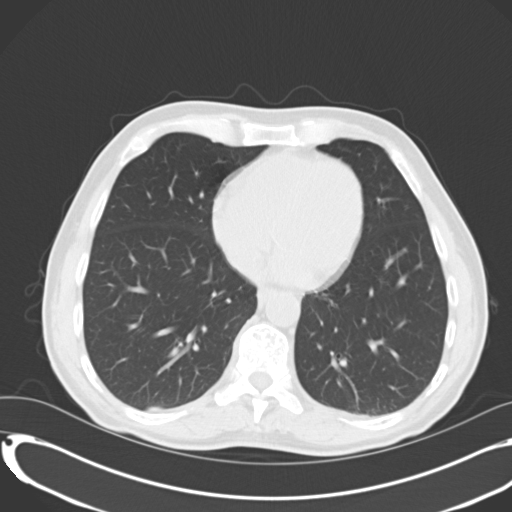
[im 26/66  mediastinal]
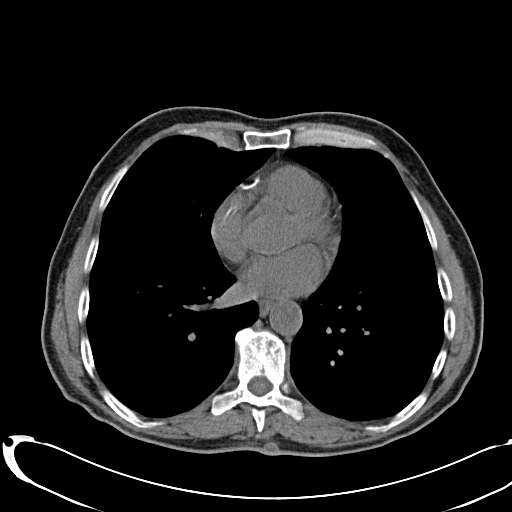
[im 26/66  lung]
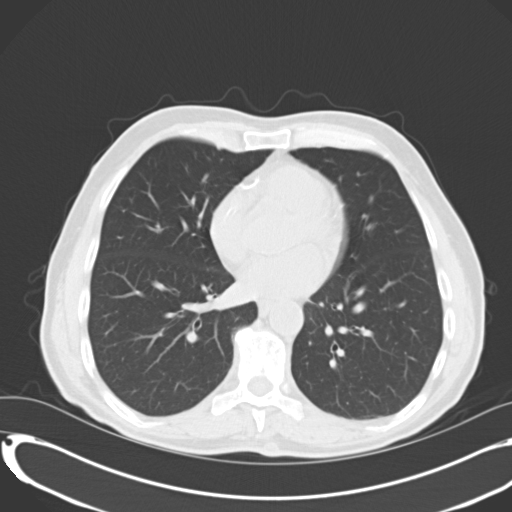
[im 31/66  lung]
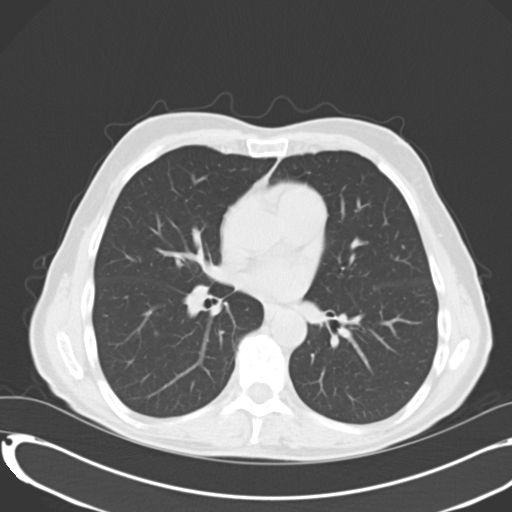
[im 36/66  lung]
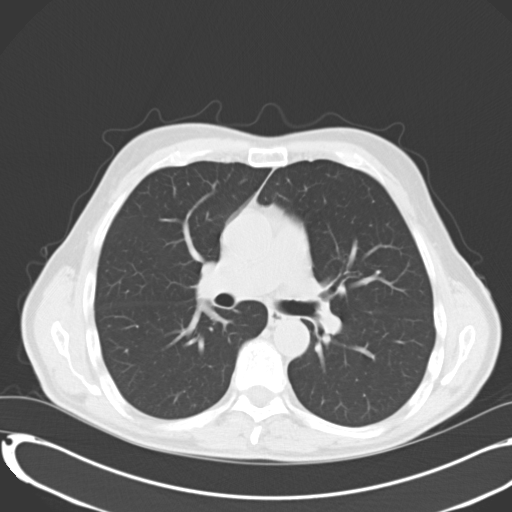
[im 41/66  lung]
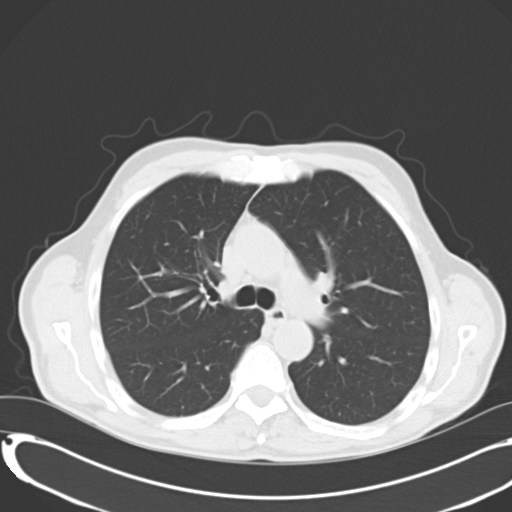
[im 46/66  mediastinal]
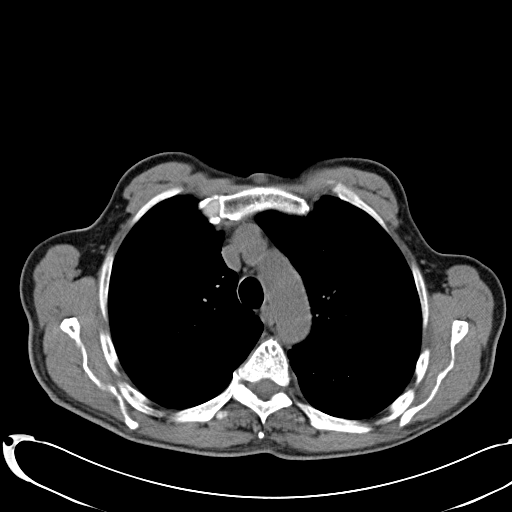
[im 46/66  lung]
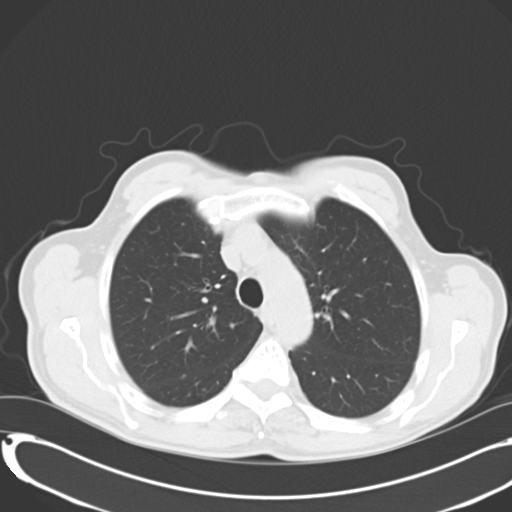
[im 51/66  lung]
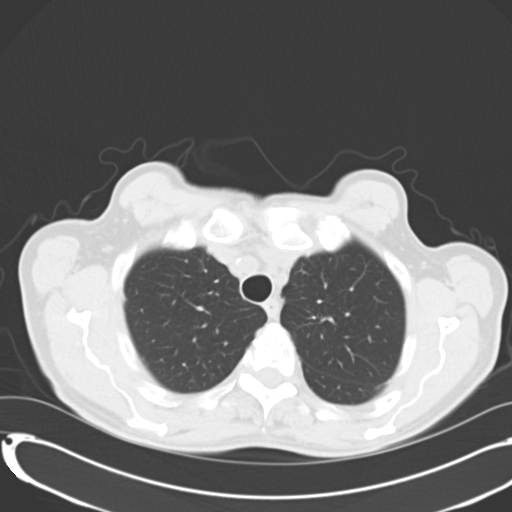
[im 56/66  lung]
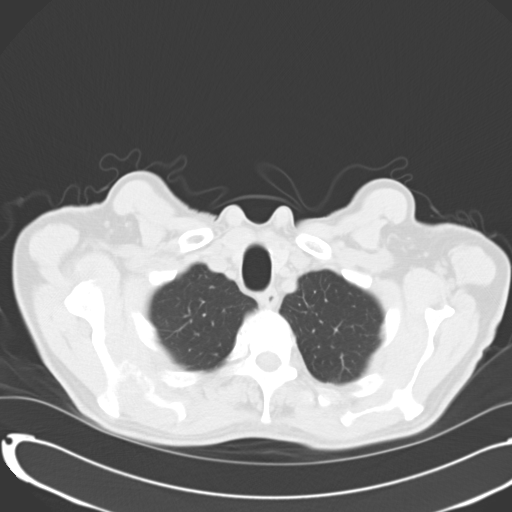
[im 61/66  lung]
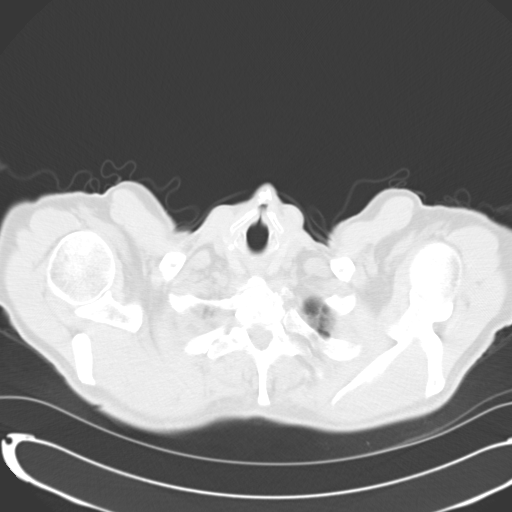

[Series 602: <mpr thick range> · coronal · 0.74mm/px · 3 of 87 slices shown]
[im 18/87  lung]
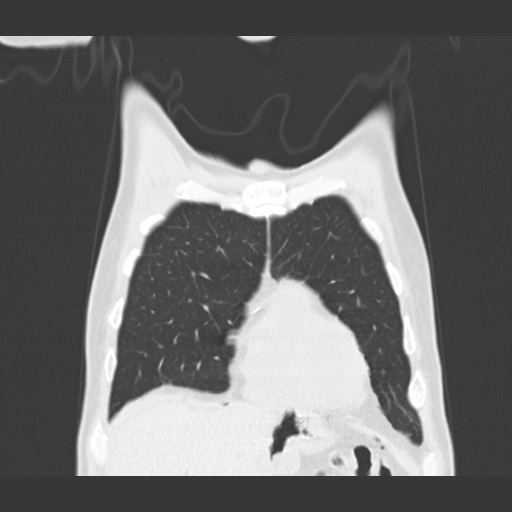
[im 35/87  lung]
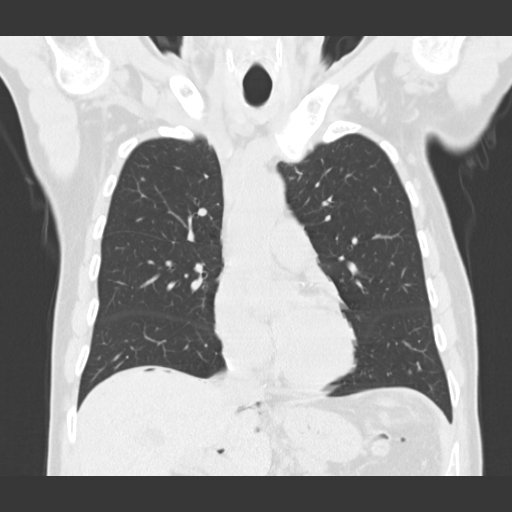
[im 52/87  lung]
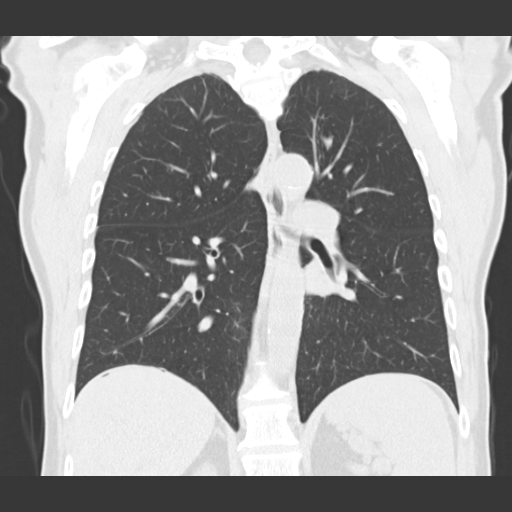

[15 of 36 positions shown; findings below may reference images not displayed]

FINDINGS: Lung windows demonstrate mild left base scarring or
atelectasis.  No pulmonary nodule or mass.

Soft tissue windows demonstrate normal heart size without
pericardial or pleural effusion.

Multivessel coronary artery atherosclerosis.  No mediastinal or
definite hilar adenopathy, given limitations of unenhanced CT.

Limited abdominal imaging demonstrates numerous low density liver
lesions, as detailed on prior contrast enhanced abdominal CT.
Small volume free intraperitoneal air.  Upper pole right renal
lesion, likely a cyst.  Left paraspinous probable sebaceous cyst,
measuring approximately 1 cm on image 41. No acute osseous
abnormality. Too small to characterize lesion within the C7
vertebral body posteriorly on sagittal image 54.
IMPRESSION: 1.  No acute process or evidence of metastatic disease in the
chest.
2.  Free intraperitoneal air within the upper abdomen.  The patient
is status post right hemicolectomy 04/08/2011.  This is slightly
greater than the expected at this postoperative state.  Correlate
with any abdominal complaints to exclude complication or other
cause of acute free intraperitoneal air.

These results will be called to the ordering clinician or
representative by the Radiologist Assistant, and communication
documented in the PACS Dashboard.

## 2012-11-15 ENCOUNTER — Other Ambulatory Visit (HOSPITAL_BASED_OUTPATIENT_CLINIC_OR_DEPARTMENT_OTHER): Payer: Medicare Other | Admitting: Lab

## 2012-11-15 ENCOUNTER — Ambulatory Visit (HOSPITAL_COMMUNITY)
Admission: RE | Admit: 2012-11-15 | Discharge: 2012-11-15 | Disposition: A | Discharge status: Home / Self Care | Payer: Medicare Other | Source: Ambulatory Visit | Attending: Oncology | Admitting: Oncology

## 2012-11-15 ENCOUNTER — Encounter (HOSPITAL_COMMUNITY): Payer: Self-pay

## 2012-11-15 DIAGNOSIS — K429 Umbilical hernia without obstruction or gangrene: Secondary | ICD-10-CM | POA: Insufficient documentation

## 2012-11-15 DIAGNOSIS — C182 Malignant neoplasm of ascending colon: Secondary | ICD-10-CM

## 2012-11-15 DIAGNOSIS — C180 Malignant neoplasm of cecum: Secondary | ICD-10-CM | POA: Insufficient documentation

## 2012-11-15 DIAGNOSIS — K7689 Other specified diseases of liver: Secondary | ICD-10-CM | POA: Insufficient documentation

## 2012-11-15 DIAGNOSIS — K5730 Diverticulosis of large intestine without perforation or abscess without bleeding: Secondary | ICD-10-CM | POA: Insufficient documentation

## 2012-11-15 DIAGNOSIS — D696 Thrombocytopenia, unspecified: Secondary | ICD-10-CM

## 2012-11-15 DIAGNOSIS — R161 Splenomegaly, not elsewhere classified: Secondary | ICD-10-CM | POA: Insufficient documentation

## 2012-11-15 DIAGNOSIS — R229 Localized swelling, mass and lump, unspecified: Secondary | ICD-10-CM | POA: Insufficient documentation

## 2012-11-15 DIAGNOSIS — K8020 Calculus of gallbladder without cholecystitis without obstruction: Secondary | ICD-10-CM | POA: Insufficient documentation

## 2012-11-15 LAB — BASIC METABOLIC PANEL (CC13)
Anion Gap: 10 mEq/L (ref 3–11)
CO2: 26 mEq/L (ref 22–29)
Calcium: 9.8 mg/dL (ref 8.4–10.4)
Chloride: 106 mEq/L (ref 98–109)
Creatinine: 1.1 mg/dL (ref 0.7–1.3)
Glucose: 129 mg/dl (ref 70–140)
Sodium: 143 mEq/L (ref 136–145)

## 2012-11-15 LAB — CBC WITH DIFFERENTIAL/PLATELET
Basophils Absolute: 0 10*3/uL (ref 0.0–0.1)
EOS%: 2.8 % (ref 0.0–7.0)
Eosinophils Absolute: 0.1 10*3/uL (ref 0.0–0.5)
HCT: 36.8 % — ABNORMAL LOW (ref 38.4–49.9)
HGB: 12.6 g/dL — ABNORMAL LOW (ref 13.0–17.1)
LYMPH%: 28 % (ref 14.0–49.0)
MCH: 27.3 pg (ref 27.2–33.4)
MCV: 79.8 fL (ref 79.3–98.0)
MONO%: 6.9 % (ref 0.0–14.0)
NEUT%: 62 % (ref 39.0–75.0)
lymph#: 1.1 10*3/uL (ref 0.9–3.3)

## 2012-11-15 MED ORDER — IOHEXOL 300 MG/ML  SOLN
100.0000 mL | Freq: Once | INTRAMUSCULAR | Status: AC | PRN
  Administered 2012-11-15: 100 mL via INTRAVENOUS

## 2012-11-17 ENCOUNTER — Ambulatory Visit: Payer: Medicare Other | Admitting: Oncology

## 2012-11-21 ENCOUNTER — Ambulatory Visit (HOSPITAL_BASED_OUTPATIENT_CLINIC_OR_DEPARTMENT_OTHER): Payer: Medicare Other | Admitting: Oncology

## 2012-11-21 ENCOUNTER — Telehealth: Payer: Self-pay | Admitting: Oncology

## 2012-11-21 VITALS — BP 115/58 | HR 89 | Temp 98.9°F | Resp 20 | Ht 74.0 in | Wt 178.6 lb

## 2012-11-21 DIAGNOSIS — Z23 Encounter for immunization: Secondary | ICD-10-CM

## 2012-11-21 DIAGNOSIS — D509 Iron deficiency anemia, unspecified: Secondary | ICD-10-CM

## 2012-11-21 DIAGNOSIS — D61811 Other drug-induced pancytopenia: Secondary | ICD-10-CM

## 2012-11-21 DIAGNOSIS — D6481 Anemia due to antineoplastic chemotherapy: Secondary | ICD-10-CM

## 2012-11-21 DIAGNOSIS — D696 Thrombocytopenia, unspecified: Secondary | ICD-10-CM

## 2012-11-21 DIAGNOSIS — C180 Malignant neoplasm of cecum: Secondary | ICD-10-CM

## 2012-11-21 DIAGNOSIS — C182 Malignant neoplasm of ascending colon: Secondary | ICD-10-CM

## 2012-11-21 MED ORDER — INFLUENZA VAC SPLIT QUAD 0.5 ML IM SUSP
0.5000 mL | Freq: Once | INTRAMUSCULAR | Status: AC
  Administered 2012-11-21: 0.5 mL via INTRAMUSCULAR
  Filled 2012-11-21: qty 0.5

## 2012-11-21 NOTE — Telephone Encounter (Signed)
Gave pt appt for lab on December and MD with labs on April 2015

## 2012-11-21 NOTE — Progress Notes (Signed)
   [name]    OFFICE PROGRESS NOTE   INTERVAL HISTORY:   [name] returns as scheduled. He feels well. No difficulty with bowel function. He continues to have neuropathy symptoms, chiefly in the feet. This does not interfere with activity.  Objective:  Vital signs in last 24 hours:  Blood pressure 115/58, pulse 89, temperature 98.9 F (37.2 C), temperature source Oral, resp. rate 20, height 6\' 2"  (1.88 m), weight 178 lb 9.6 oz (81.012 kg).    HEENT: Neck without mass Lymphatics: No cervical, supraclavicular, axillary, or inguinal nodes Resp: Lungs clear bilaterally Cardio: Regular rate and rhythm GI: No hepatosplenomegaly, nontender, no mass Vascular: No leg edema  Skin: Multiple lipomas, sebaceous cyst at the left lower back   Lab Results:  Lab Results  Component Value Date   WBC 3.9* 11/15/2012   HGB 12.6* 11/15/2012   HCT 36.8* 11/15/2012   MCV 79.8 11/15/2012   PLT 115* 11/15/2012   ANC 2.4  CEA 1.0  X-rays: CTs of the chest, abdomen, and pelvis on 11/15/2012-no evidence of metastatic disease in the chest. The spleen is enlarged-unchanged. Stable hepatic cysts.    Medications: I have reviewed the patient's current medications.  Assessment/Plan: 1.Moderately differentiated adenocarcinoma of the cecum, stage IIIB (T4 N1), status post a right colectomy on 04/08/2011 -the tumor was microsatellite stable and K-ras wild-type . He began adjuvant CAPOX chemotherapy on 05/03/2011. He completed cycle 5 on 07/26/2011 (oxaliplatin was held from cycle 4). He completed cycle 7 beginning 09/06/2011. He completed cycle 8 beginning 09/27/2011.  2. History of colon polyps. He underwent a surveillance colonoscopy by Dr. Arlyce Dice in February 2014 with benign findings  3. 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 The hemoglobin is higher-he will discontinue iron.  4. Postoperative abdominal wall/flank hematoma.  5. Sleep  apnea.  6. History of atrial fibrillation.  7. History of esophageal stricture.  8. Family history of colon cancer and gastric cancer. The tumor was microsatellite stable and no mutation was found in the mismatch repair genes. A CancerNext panel was negative 9. 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  11. 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 He continues to have mild pancytopenia 12. Abdominal wall hernia noted on a CT of the abdomen 12/03/2011  13. Splenomegaly-noted on CT scans back to January of 2013   Disposition:  [name] remains in clinical remission from colon cancer. He has persistent mild pancytopenia, and splenomegaly is noted on repeat CT scans. He may have an underlying low-grade lymphoproliferative disorder. He has no symptoms to suggest a lymphoproliferative disorder at present. We decided to observe the mild pancytopenia for now. He will return for a CBC and blood smear review in 2 months. He is scheduled for an office visit and CEA in 6 months. He will contact us in the interim for bleeding, fever, night sweats, or other new symptoms.  [name] received an influenza vaccine today.   Thornton Papas, MD  11/21/2012  5:01 PM

## 2012-11-23 ENCOUNTER — Ambulatory Visit: Payer: Medicare Other | Admitting: Oncology

## 2012-11-30 ENCOUNTER — Ambulatory Visit: Payer: Medicare Other | Admitting: Oncology

## 2013-01-08 DIAGNOSIS — J189 Pneumonia, unspecified organism: Secondary | ICD-10-CM | POA: Insufficient documentation

## 2013-01-08 HISTORY — DX: Pneumonia, unspecified organism: J18.9

## 2013-01-23 ENCOUNTER — Other Ambulatory Visit: Payer: Medicare Other

## 2013-01-24 ENCOUNTER — Encounter: Payer: Self-pay | Admitting: Family Medicine

## 2013-01-24 ENCOUNTER — Ambulatory Visit (INDEPENDENT_AMBULATORY_CARE_PROVIDER_SITE_OTHER): Payer: Medicare Other | Admitting: Family Medicine

## 2013-01-24 VITALS — BP 120/70 | Temp 99.7°F | Wt 180.0 lb

## 2013-01-24 DIAGNOSIS — J069 Acute upper respiratory infection, unspecified: Secondary | ICD-10-CM | POA: Insufficient documentation

## 2013-01-24 MED ORDER — HYDROCODONE-HOMATROPINE 5-1.5 MG/5ML PO SYRP: 5.0000 mL | ORAL_SOLUTION | Freq: Three times a day (TID) | ORAL | Status: DC | PRN

## 2013-01-24 NOTE — Progress Notes (Signed)
Pre visit review using our clinic review tool, if applicable. No additional management support is needed unless otherwise documented below in the visit note. 

## 2013-01-24 NOTE — Progress Notes (Signed)
   Subjective:    Patient ID: [name], male    DOB: [birth year], 72 y.o.   MRN: [id number]  HPI [name] is a 72 year old married male nonsmoker who comes in today for evaluation of a three-day history of head congestion sore throat fever and cough  His symptoms started on Monday with a fever of 103. Fever is gone today. He initially had some chills also on Tuesday. Today he says he hasn't had congestion sore throat mild cough fever gone.  He said he had a CBC in October of 2014 his white count was 3800. His last chemotherapy for his colon cancer was in September 2013   Review of Systems Review of systems negative except his wife has the same symptoms    Objective:   Physical Exam  Well-developed and nourished male no acute distress vital signs stable he is afebrile ENT negative neck was supple lungs are clear      Assessment & Plan:  Viral syndrome plan treat symptomatically

## 2013-01-24 NOTE — Patient Instructions (Signed)
Drink lots of water  Tylenol,,,,,, 2 tabs 3 times daily when necessary  Hydromet,,,,,,, 1/2-1 teaspoon 3 times daily when necessary for cough and cold,

## 2013-01-25 ENCOUNTER — Other Ambulatory Visit (HOSPITAL_BASED_OUTPATIENT_CLINIC_OR_DEPARTMENT_OTHER): Payer: Medicare Other

## 2013-01-25 DIAGNOSIS — D696 Thrombocytopenia, unspecified: Secondary | ICD-10-CM

## 2013-01-25 LAB — CBC WITH DIFFERENTIAL/PLATELET
Basophils Absolute: 0 10*3/uL (ref 0.0–0.1)
EOS%: 0 % (ref 0.0–7.0)
Eosinophils Absolute: 0 10*3/uL (ref 0.0–0.5)
HCT: 35.3 % — ABNORMAL LOW (ref 38.4–49.9)
HGB: 11.7 g/dL — ABNORMAL LOW (ref 13.0–17.1)
MCH: 25.8 pg — ABNORMAL LOW (ref 27.2–33.4)
MONO#: 0.2 10*3/uL (ref 0.1–0.9)
MONO%: 5.7 % (ref 0.0–14.0)
NEUT#: 3.6 10*3/uL (ref 1.5–6.5)
NEUT%: 83.8 % — ABNORMAL HIGH (ref 39.0–75.0)
Platelets: 72 10*3/uL — ABNORMAL LOW (ref 140–400)
RDW: 15.1 % — ABNORMAL HIGH (ref 11.0–14.6)
WBC: 4.3 10*3/uL (ref 4.0–10.3)
lymph#: 0.4 10*3/uL — ABNORMAL LOW (ref 0.9–3.3)

## 2013-01-30 ENCOUNTER — Emergency Department (HOSPITAL_COMMUNITY)
Admission: EM | Admit: 2013-01-30 | Discharge: 2013-01-31 | Disposition: A | Discharge status: Home / Self Care | Payer: Medicare Other | Attending: Emergency Medicine | Admitting: Emergency Medicine

## 2013-01-30 ENCOUNTER — Emergency Department (HOSPITAL_COMMUNITY): Payer: Medicare Other

## 2013-01-30 ENCOUNTER — Telehealth: Payer: Self-pay | Admitting: Family Medicine

## 2013-01-30 DIAGNOSIS — Z862 Personal history of diseases of the blood and blood-forming organs and certain disorders involving the immune mechanism: Secondary | ICD-10-CM | POA: Insufficient documentation

## 2013-01-30 DIAGNOSIS — G4730 Sleep apnea, unspecified: Secondary | ICD-10-CM | POA: Insufficient documentation

## 2013-01-30 DIAGNOSIS — E785 Hyperlipidemia, unspecified: Secondary | ICD-10-CM | POA: Insufficient documentation

## 2013-01-30 DIAGNOSIS — M19049 Primary osteoarthritis, unspecified hand: Secondary | ICD-10-CM | POA: Insufficient documentation

## 2013-01-30 DIAGNOSIS — Z7982 Long term (current) use of aspirin: Secondary | ICD-10-CM | POA: Insufficient documentation

## 2013-01-30 DIAGNOSIS — Z95818 Presence of other cardiac implants and grafts: Secondary | ICD-10-CM | POA: Insufficient documentation

## 2013-01-30 DIAGNOSIS — J189 Pneumonia, unspecified organism: Secondary | ICD-10-CM

## 2013-01-30 DIAGNOSIS — Z860100 Personal history of colon polyps, unspecified: Secondary | ICD-10-CM | POA: Insufficient documentation

## 2013-01-30 DIAGNOSIS — Z79899 Other long term (current) drug therapy: Secondary | ICD-10-CM | POA: Insufficient documentation

## 2013-01-30 DIAGNOSIS — Z8679 Personal history of other diseases of the circulatory system: Secondary | ICD-10-CM | POA: Insufficient documentation

## 2013-01-30 DIAGNOSIS — K219 Gastro-esophageal reflux disease without esophagitis: Secondary | ICD-10-CM | POA: Insufficient documentation

## 2013-01-30 DIAGNOSIS — Z87891 Personal history of nicotine dependence: Secondary | ICD-10-CM | POA: Insufficient documentation

## 2013-01-30 DIAGNOSIS — Z85038 Personal history of other malignant neoplasm of large intestine: Secondary | ICD-10-CM | POA: Insufficient documentation

## 2013-01-30 DIAGNOSIS — J159 Unspecified bacterial pneumonia: Secondary | ICD-10-CM | POA: Insufficient documentation

## 2013-01-30 DIAGNOSIS — R5381 Other malaise: Secondary | ICD-10-CM | POA: Insufficient documentation

## 2013-01-30 LAB — BASIC METABOLIC PANEL
Chloride: 96 mEq/L (ref 96–112)
GFR calc Af Amer: >90 mL/min (ref >90)
GFR calc non Af Amer: 81 mL/min — ABNORMAL LOW (ref >90)
Potassium: 3.8 mEq/L (ref 3.5–5.1)
Sodium: 130 mEq/L — ABNORMAL LOW (ref 135–145)

## 2013-01-30 LAB — CBC
HCT: 33.6 % — ABNORMAL LOW (ref 39.0–52.0)
Hemoglobin: 11.9 g/dL — ABNORMAL LOW (ref 13.0–17.0)
MCH: 26.1 pg (ref 26.0–34.0)
RBC: 4.56 MIL/uL (ref 4.22–5.81)
WBC: 9 10*3/uL (ref 4.0–10.5)

## 2013-01-30 MED ORDER — SODIUM CHLORIDE 0.9 % IV BOLUS (SEPSIS)
500.0000 mL | Freq: Once | INTRAVENOUS | Status: AC
  Administered 2013-01-30: 500 mL via INTRAVENOUS

## 2013-01-30 MED ORDER — ACETAMINOPHEN 500 MG PO TABS
1000.0000 mg | ORAL_TABLET | Freq: Once | ORAL | Status: AC
  Administered 2013-01-30: 1000 mg via ORAL
  Filled 2013-01-30: qty 2

## 2013-01-30 MED ORDER — SODIUM CHLORIDE 0.9 % IV SOLN: INTRAVENOUS | Status: DC

## 2013-01-30 MED ORDER — LEVOFLOXACIN 750 MG PO TABS: ORAL_TABLET | ORAL | Status: DC

## 2013-01-30 MED ORDER — LEVOFLOXACIN IN D5W 750 MG/150ML IV SOLN
750.0000 mg | Freq: Once | INTRAVENOUS | Status: AC
  Administered 2013-01-30: 750 mg via INTRAVENOUS
  Filled 2013-01-30: qty 150

## 2013-01-30 MED ORDER — ALBUTEROL SULFATE (5 MG/ML) 0.5% IN NEBU
5.0000 mg | INHALATION_SOLUTION | Freq: Once | RESPIRATORY_TRACT | Status: AC
Start: 2013-01-30 — End: 2013-01-30
  Administered 2013-01-30: 5 mg via RESPIRATORY_TRACT
  Filled 2013-01-30: qty 1

## 2013-01-30 NOTE — Telephone Encounter (Signed)
Unable to reach pt; msg left on VM °

## 2013-01-30 NOTE — ED Notes (Signed)
Pt verbalizes understanding 

## 2013-01-30 NOTE — ED Provider Notes (Signed)
CSN: [id number]     Arrival date & time 01/30/13  1832 History   First MD Initiated Contact with Patient 01/30/13 2051     Chief Complaint  Patient presents with  . Fever  . Cough  . Shortness of Breath  . Nasal Congestion  . Weakness   (Consider location/radiation/quality/duration/timing/severity/associated sxs/prior Treatment) HPI Comments: [name] is a 72 y.o. male who has been ill for about one week with fever, cough, rhinorrhea, weakness, and decreased appetite. He saw his PCP, one week ago, and was told to rest. Today, he felt worse, so he decided to come here. He has a productive cough. There's been no vomiting. Her temperature 103 today at home. He is not taking any antipyretics. He completed chemotherapy and surgery for colon cancer about one year ago. He is not taking any chemotherapeutic agents, at this time. There are no other known modifying factors.  Patient is a 72 y.o. male presenting with fever, cough, shortness of breath, and weakness. The history is provided by the patient and the spouse.  Fever Associated symptoms: cough   Cough Associated symptoms: fever and shortness of breath   Shortness of Breath Associated symptoms: cough and fever   Weakness Associated symptoms include shortness of breath.    Past Medical History  Diagnosis Date  . Diverticulosis     Sigmoid colon  . Personal history of colonic polyps 09/17/2009    Tubular adenoma  . History of hemorrhoids   . Hyperlipidemia   . Anemia   . 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 Sleep apnea 03-31-11    dx. 2-3 yrs ago, uses cap nightly  . GERD (gastroesophageal reflux disease) 03-31-11    tx. Omeprazole  . Sleep apnea     dx 2010     . Atrial fibrillation 2006    Dr Franki Monte af-no meds  . colon ca dx'd 02/2011   Past Surgical History  Procedure Laterality Date  . Inguinal hernia repair    . Cad  2004    Dr Donnie Aho  . Colon surgery  04/08/2011    lap patial colectomy  . Cardiac  catheterization      around 2004  by Dr Donnie Aho  . Portacath placement  04/30/2011    Procedure: INSERTION PORT-A-CATH;  Surgeon: Adolph Pollack, MD;  Location: [facility] OR;  Service: General;  Laterality: Right;  . Port-a-cath removal  11/09/2011    Procedure: REMOVAL PORT-A-CATH;  Surgeon: Adolph Pollack, MD;  Location: [facility];  Service: General;  Laterality: N/A;   Family History  Problem Relation Age of Onset  . Diabetes Father   . Prostate cancer Father   . Heart attack Father 43  . Heart failure Mother     Congestive heart failure  . Cancer Brother     colon  . Colon cancer Brother 50  . Stomach cancer Sister   . Coronary artery disease Brother     CABG  . Anesthesia problems Neg Hx    History  Substance Use Topics  . Smoking status: Former Smoker -- 7 years    Quit date: 03/21/1961  . Smokeless tobacco: Never Used  . Alcohol Use: No    Review of Systems  Constitutional: Positive for fever.  Respiratory: Positive for cough and shortness of breath.   Neurological: Positive for weakness.  All other systems reviewed and are negative.    Allergies  Review of patient's allergies indicates no known allergies.  Home  Medications   Current Outpatient Rx  Name  Route  Sig  Dispense  Refill  . amphetamine-dextroamphetamine (ADDERALL) 10 MG tablet   Oral   Take 30 mg by mouth daily.          Marland Kitchen aspirin 81 MG tablet   Oral   Take 81 mg by mouth at bedtime.          . docusate sodium (COLACE) 100 MG capsule   Oral   Take 100 mg by mouth at bedtime. Unsure of dose         . HYDROcodone-homatropine (HYCODAN) 5-1.5 MG/5ML syrup   Oral   Take 5 mLs by mouth every 8 (eight) hours as needed for cough.   240 mL   0   . Multiple Vitamin (MULTIVITAMIN WITH MINERALS) TABS tablet   Oral   Take 1 tablet by mouth daily.         Marland Kitchen omeprazole (PRILOSEC) 10 MG capsule   Oral   Take 1 capsule (10 mg total) by mouth 2 (two) times daily.   180  capsule   3   . sildenafil (VIAGRA) 100 MG tablet   Oral   Take 1 tablet (100 mg total) by mouth daily as needed.   6 tablet   11   . simvastatin (ZOCOR) 20 MG tablet   Oral   Take 1 tablet (20 mg total) by mouth at bedtime.   100 tablet   3   . levofloxacin (LEVAQUIN) 750 MG tablet      X 10 days   10 tablet   0    BP 107/61  Pulse 107  Temp(Src) 102.5 F (39.2 C) (Oral)  Resp 20  SpO2 92% Physical Exam  Nursing note and vitals reviewed. Constitutional: He is oriented to person, place, and time. He appears well-developed.  Elderly frail  HENT:  Head: Normocephalic and atraumatic.  Right Ear: External ear normal.  Left Ear: External ear normal.  Eyes: Conjunctivae and EOM are normal. Pupils are equal, round, and reactive to light.  Neck: Normal range of motion and phonation normal. Neck supple.  Cardiovascular: Normal rate, regular rhythm, normal heart sounds and intact distal pulses.   Pulmonary/Chest: Effort normal and breath sounds normal. No respiratory distress. He has no wheezes. He has no rales. He exhibits no bony tenderness.  Scattered rhonchi  Abdominal: Soft. Normal appearance. There is no tenderness.  Musculoskeletal: Normal range of motion.  Neurological: He is alert and oriented to person, place, and time. No cranial nerve deficit or sensory deficit. He exhibits normal muscle tone. Coordination normal.  Skin: Skin is warm, dry and intact.  Psychiatric: He has a normal mood and affect. His behavior is normal. Judgment and thought content normal.    ED Course  Procedures (including critical care time) Medications  0.9 %  sodium chloride infusion (not administered)  albuterol (PROVENTIL) (5 MG/ML) 0.5% nebulizer solution 5 mg (5 mg Nebulization Given 01/30/13 1945)  levofloxacin (LEVAQUIN) IVPB 750 mg (0 mg Intravenous Stopped 01/30/13 2255)  sodium chloride 0.9 % bolus 500 mL (500 mLs Intravenous New Bag/Given 01/30/13 2119)  acetaminophen (TYLENOL)  tablet 1,000 mg (1,000 mg Oral Given 01/30/13 2118)    Patient Vitals for the past 24 hrs:  BP Temp Temp src Pulse Resp SpO2  01/30/13 2309 - - - - - 92 %  01/30/13 2244 - 102.5 F (39.2 C) Oral - - -  01/30/13 2200 107/61 mmHg - - 107 20 95 %  01/30/13 2130 131/53 mmHg - - 110 20 96 %  01/30/13 2113 - 104.3 F (40.2 C) Rectal - - -  01/30/13 1930 - - - - - 98 %  01/30/13 1856 133/46 mmHg 98.3 F (36.8 C) Oral 78 19 86 %   11:41 PM Reevaluation with update and discussion. After initial assessment and treatment, an updated evaluation reveals at this time. He feels much better. He has been able to ambulate without desaturation of oxygen. Vital signs have improved.Mancel Bale L    Labs Review Labs Reviewed  BASIC METABOLIC PANEL - Abnormal; Notable for the following:    Sodium 130 (*)    Glucose, Bld 127 (*)    GFR calc non Af Amer 81 (*)    All other components within normal limits  CBC - Abnormal; Notable for the following:    Hemoglobin 11.9 (*)    HCT 33.6 (*)    MCV 73.7 (*)    All other components within normal limits  CULTURE, BLOOD (ROUTINE X 2)  CULTURE, BLOOD (ROUTINE X 2)  CG4 I-STAT (LACTIC ACID)   Imaging Review Dg Chest 2 View (if Patient Has Fever And/or Copd)  01/30/2013   CLINICAL DATA:  Cough, fever, shortness of breath.  EXAM: CHEST  2 VIEW  COMPARISON:  11/17/2011 and chest CT dated 11/15/2012.  FINDINGS: Interval small amount of ill-defined, patchy density in the left mid and lower lung zones. Clear right lung. Normal sized heart. No significant change in diffuse peribronchial thickening and accentuation of the interstitial markings. Diffuse osteopenia.  IMPRESSION: 1. Probable pneumonia in the left mid and lower lung zones. 2. Stable mild chronic bronchitic changes.   Electronically Signed   By: Gordan Payment M.D.   On: 01/30/2013 19:45    EKG Interpretation   None       MDM   1. CAP (community acquired pneumonia)      Evaluation consistent  with community-acquired pneumonia. He's improved and stable discharge. There is no evidence for ACS, sepsis, metabolic instability, or impending vascular collapse.   The patient appears reasonably screened and/or stabilized for discharge and I doubt any other medical condition or other [facility] requiring further screening, evaluation, or treatment in the ED at this time prior to discharge.  Nursing Notes Reviewed/ Care Coordinated, and agree without changes. Applicable Imaging Reviewed.  Interpretation of Laboratory Data incorporated into ED treatment    Plan: Home Medications- Levaquin; Home Treatments and Observation- Rest, Fluids; return here if the recommended treatment, does not improve the symptoms; Recommended follow up- PCP 10 days      Flint Melter, MD 01/30/13 [id number]

## 2013-01-30 NOTE — ED Notes (Signed)
Pt c/o of fever, shob, cough, runny nose, weakness and battling virus for little while now. Pt also has colon cancer and still getting treatments.

## 2013-01-31 ENCOUNTER — Encounter (HOSPITAL_COMMUNITY): Payer: Self-pay | Admitting: Emergency Medicine

## 2013-02-02 NOTE — Telephone Encounter (Signed)
Pt went to the hospital and dx w/ pneumonia. Pt advised to fu w/ pcp in a week. No appts. pls advise

## 2013-02-05 NOTE — Telephone Encounter (Signed)
Okay to work in with Ms Orvan Falconer if available

## 2013-02-06 LAB — CULTURE, BLOOD (ROUTINE X 2)
Culture: NO GROWTH
Culture: NO GROWTH

## 2013-02-06 NOTE — Telephone Encounter (Signed)
appt sched w/ padonda

## 2013-02-07 ENCOUNTER — Telehealth: Payer: Self-pay | Admitting: *Deleted

## 2013-02-07 ENCOUNTER — Other Ambulatory Visit: Payer: Self-pay | Admitting: *Deleted

## 2013-02-07 DIAGNOSIS — C182 Malignant neoplasm of ascending colon: Secondary | ICD-10-CM

## 2013-02-07 NOTE — Telephone Encounter (Signed)
Late entry: he reports his last colonoscopy was March 2013 per Dr. Arlyce Dice.

## 2013-02-07 NOTE — Telephone Encounter (Signed)
Message copied by Wandalee Ferdinand on Wed Feb 07, 2013  4:00 PM ------      Message from: Thornton Papas B      Created: Mon Feb 05, 2013  8:51 PM       Please call patient, platelets and hemoglobin are lower.  Bleeding? Return for ferritin level and stool hemoccults and repeat cbc next 1 week.  Then start ferrous sulfate 325mg  bid.            Repeat cbc and office visit sherrill or lisa after 73month of iron therapy. Be sure he is up to date on colonoscopy. ------

## 2013-02-07 NOTE — Telephone Encounter (Signed)
Discussed CBC results with Jake Shark. He denies any GI bleeding or any bleeding from any site. Agrees to come in Friday to pick up his stool cards and return them next week and have labs checked. Afterwards will start ferrous sulfate 325 mg twice daily. Will then be seen with labs 1 month afterwards. POF to scheduler. Stool cards at front desk to pick up.

## 2013-02-09 ENCOUNTER — Encounter: Payer: Self-pay | Admitting: Oncology

## 2013-02-09 ENCOUNTER — Telehealth: Payer: Self-pay | Admitting: Oncology

## 2013-02-09 NOTE — Telephone Encounter (Signed)
s.w pt and advised on Jan and Feb 2015 appts...pt ok and aware

## 2013-02-09 NOTE — Progress Notes (Signed)
Blood Smear review 01/30/13:  Few metamyelocytes and myelocytes, mild decrease in platelets, normal rbcs

## 2013-02-12 ENCOUNTER — Encounter: Payer: Self-pay | Admitting: Family Medicine

## 2013-02-12 ENCOUNTER — Other Ambulatory Visit: Payer: Self-pay | Admitting: Oncology

## 2013-02-12 ENCOUNTER — Ambulatory Visit: Payer: Medicare Other | Admitting: Family

## 2013-02-12 ENCOUNTER — Ambulatory Visit (INDEPENDENT_AMBULATORY_CARE_PROVIDER_SITE_OTHER): Payer: Medicare Other | Admitting: Family Medicine

## 2013-02-12 ENCOUNTER — Ambulatory Visit (HOSPITAL_BASED_OUTPATIENT_CLINIC_OR_DEPARTMENT_OTHER): Payer: Medicare Other

## 2013-02-12 VITALS — BP 130/80 | Temp 97.4°F | Wt 177.0 lb

## 2013-02-12 DIAGNOSIS — D509 Iron deficiency anemia, unspecified: Secondary | ICD-10-CM

## 2013-02-12 DIAGNOSIS — C182 Malignant neoplasm of ascending colon: Secondary | ICD-10-CM

## 2013-02-12 DIAGNOSIS — C180 Malignant neoplasm of cecum: Secondary | ICD-10-CM

## 2013-02-12 DIAGNOSIS — J189 Pneumonia, unspecified organism: Secondary | ICD-10-CM | POA: Insufficient documentation

## 2013-02-12 LAB — FECAL OCCULT BLOOD, GUAIAC: Occult Blood: POSITIVE

## 2013-02-12 NOTE — Patient Instructions (Signed)
Return when necessary 

## 2013-02-12 NOTE — Progress Notes (Signed)
   Subjective:    Patient ID: [name], male    DOB: 11-Sep-1940, 73 y.o.   MRN: [id number]  HPI [name] is a 73 year old male who comes in today for followup having been seen in the emergency room on December 23 for pneumonia  He states for about 2 weeks prior to that he had a viral type syndrome with a congestion runny nose and cough. That day he developed a fever 103. He went to the emergency room and was seen and evaluated. Chest x-ray shows a left mid and lower lung infiltrate consistent with a community-acquired pneumonia. He had a CT scan of his chest and a chest x-ray in October for followup of colon cancer. Both of those studies were normal.  He was given IV antibiotics and ten-day prescription for Levaquin by mouth and comes in today for followup. He states he finished his medication and feels pretty much back to baseline except he has a little bit of persistent cough and some shortness of breath. No PND orthopnea   Review of Systems Review of systems negative    Objective:   Physical Exam  Well-developed well-nourished male no acute distress vital signs stable he is afebrile cardiac: Exam is normal      Assessment & Plan:  Status post pneumonia reassured return when necessary

## 2013-02-13 ENCOUNTER — Telehealth: Payer: Self-pay | Admitting: *Deleted

## 2013-02-13 NOTE — Telephone Encounter (Signed)
Notified patient that stool cards were postitive for blood X 3. Per Dr. Benay Spice, needs to see Dr. Deatra Ina. Routed lab results to his office with request for appointment. Patient will call in next couple days to follow up. He will return in couple days for the CBC and ferritin.

## 2013-02-14 ENCOUNTER — Other Ambulatory Visit (HOSPITAL_BASED_OUTPATIENT_CLINIC_OR_DEPARTMENT_OTHER): Payer: Medicare Other

## 2013-02-14 DIAGNOSIS — C180 Malignant neoplasm of cecum: Secondary | ICD-10-CM

## 2013-02-14 DIAGNOSIS — C182 Malignant neoplasm of ascending colon: Secondary | ICD-10-CM

## 2013-02-14 LAB — CBC WITH DIFFERENTIAL/PLATELET
BASO%: 0.9 % (ref 0.0–2.0)
BASOS ABS: 0 10*3/uL (ref 0.0–0.1)
EOS%: 1 % (ref 0.0–7.0)
Eosinophils Absolute: 0 10*3/uL (ref 0.0–0.5)
HCT: 33.8 % — ABNORMAL LOW (ref 38.4–49.9)
HEMOGLOBIN: 11.3 g/dL — AB (ref 13.0–17.1)
LYMPH%: 29 % (ref 14.0–49.0)
MCH: 25.9 pg — ABNORMAL LOW (ref 27.2–33.4)
MCHC: 33.4 g/dL (ref 32.0–36.0)
MCV: 77.5 fL — AB (ref 79.3–98.0)
MONO#: 0.4 10*3/uL (ref 0.1–0.9)
MONO%: 8.1 % (ref 0.0–14.0)
NEUT#: 3 10*3/uL (ref 1.5–6.5)
NEUT%: 61 % (ref 39.0–75.0)
PLATELETS: 210 10*3/uL (ref 140–400)
RBC: 4.37 10*6/uL (ref 4.20–5.82)
RDW: 16.4 % — AB (ref 11.0–14.6)
WBC: 4.9 10*3/uL (ref 4.0–10.3)
lymph#: 1.4 10*3/uL (ref 0.9–3.3)

## 2013-02-14 LAB — FERRITIN CHCC: Ferritin: 38 ng/ml (ref 22–316)

## 2013-02-15 ENCOUNTER — Telehealth: Payer: Self-pay | Admitting: *Deleted

## 2013-02-15 DIAGNOSIS — C182 Malignant neoplasm of ascending colon: Secondary | ICD-10-CM

## 2013-02-15 NOTE — Telephone Encounter (Signed)
Message copied by Brien Few on Thu Feb 15, 2013  8:40 AM ------      Message from: Betsy Coder B      Created: Wed Feb 14, 2013  6:23 PM       Please call patient, ferritin in low normal range, begin ferrous sulfate 325mg  bid after seen by Dr. Deatra Ina ------

## 2013-02-16 ENCOUNTER — Telehealth: Payer: Self-pay | Admitting: Gastroenterology

## 2013-02-16 NOTE — Telephone Encounter (Signed)
Pt returned call, instructed him to begin Ferrous Sulfate 325 mg BID after he is seen by Dr. Deatra Ina. He has not heard from GI office as of yet. Left message at [facility] requesting follow up on appt.

## 2013-02-16 NOTE — Telephone Encounter (Signed)
Left message for [name] RN at cancer center that no referral was in epic for appt. Pt scheduled to see Dr. Deatra Ina 02/19/13@9 :30am. Pt aware of appt.

## 2013-02-19 ENCOUNTER — Encounter: Payer: Self-pay | Admitting: Gastroenterology

## 2013-02-19 ENCOUNTER — Ambulatory Visit (INDEPENDENT_AMBULATORY_CARE_PROVIDER_SITE_OTHER): Payer: Medicare Other | Admitting: Gastroenterology

## 2013-02-19 ENCOUNTER — Telehealth: Payer: Self-pay | Admitting: Oncology

## 2013-02-19 VITALS — BP 100/56 | HR 96 | Ht 71.75 in | Wt 173.4 lb

## 2013-02-19 DIAGNOSIS — D509 Iron deficiency anemia, unspecified: Secondary | ICD-10-CM

## 2013-02-19 DIAGNOSIS — R195 Other fecal abnormalities: Secondary | ICD-10-CM

## 2013-02-19 DIAGNOSIS — K222 Esophageal obstruction: Secondary | ICD-10-CM

## 2013-02-19 DIAGNOSIS — C182 Malignant neoplasm of ascending colon: Secondary | ICD-10-CM

## 2013-02-19 MED ORDER — PEG-KCL-NACL-NASULF-NA ASC-C 100 G PO SOLR: 1.0000 | Freq: Once | ORAL | Status: DC

## 2013-02-19 NOTE — Patient Instructions (Signed)
You have been scheduled for a colonoscopy with propofol. Please follow written instructions given to you at your visit today.  Please pick up your prep kit at the pharmacy within the next 1-3 days. If you use inhalers (even only as needed), please bring them with you on the day of your procedure. Your physician has requested that you go to www.startemmi.com and enter the access code given to you at your visit today. This web site gives a general overview about your procedure. However, you should still follow specific instructions given to you by our office regarding your preparation for the procedure.  FREE VOUCHER FOR MOVIPREP KIT GIVEN

## 2013-02-19 NOTE — Progress Notes (Signed)
_                                                                                                                History of Present Illness: This 73 year old white male with history of colon cancer, colon polyps, esophageal stricture, and family history of colon cancer referred at the request of Dr. Sherren Mocha for evaluation of Hemoccult-positive stool.  This was noted on routine testing.  Patient complains of fatigue, especially since suffering from pneumonia last month.  Over the past 6 months there is been a drop in hemoglobin from 12.7 in July to 11.3 in January.  MCV in July was 84.7 and currently is 77.5.  He is on no gastric irritants including nonsteroidals.  He takes omeprazole daily.  He denies change in bowel habits, abdominal pain or dysphagia.  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  He received postoperative chemotherapy. Colonoscopy one year ago was negative.  Family history is pertinent for a brother and nephew with colon cancer and a sister with stomach cancer.  Genetic testing was negative but concern has been raised for Lynch syndrome.    Past Medical History  Diagnosis Date  . Diverticulosis     Sigmoid colon  . Personal history of colonic polyps 09/17/2009    Tubular adenoma  . History of hemorrhoids   . Hyperlipidemia   . Anemia   . 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 Sleep apnea 03-31-11    dx. 2-3 yrs ago, uses cap nightly  . GERD (gastroesophageal reflux disease) 03-31-11    tx. Omeprazole  . Sleep apnea     dx 2010     . Atrial fibrillation 2006    Dr Tonny Bollman af-no meds  . colon ca dx'd 02/2011  . Pneumonia    Past Surgical History  Procedure Laterality Date  . Inguinal hernia repair    . Cad  2004    Dr Wynonia Lawman  . Colon surgery  04/08/2011    lap patial colectomy  . Cardiac catheterization      around 2004  by Dr Wynonia Lawman  . Portacath placement   04/30/2011    Procedure: INSERTION PORT-A-CATH;  Surgeon: Odis Hollingshead, MD;  Location: [facility];  Service: General;  Laterality: Right;  . Port-a-cath removal  11/09/2011    Procedure: REMOVAL PORT-A-CATH;  Surgeon: Odis Hollingshead, MD;  Location: [facility];  Service: General;  Laterality: N/A;   family history includes Cancer in his brother; Colon cancer (age of onset: 55) in his brother; Coronary artery disease in his brother; Diabetes in his father; Heart attack (age of onset: 8) in his father; Heart failure in his mother; Prostate cancer in his father; Stomach cancer in his sister. There is no history of Anesthesia problems. Current Outpatient Prescriptions  Medication Sig Dispense Refill  . amphetamine-dextroamphetamine (ADDERALL) 10 MG tablet Take 30 mg by mouth daily.       Marland Kitchen  aspirin 81 MG tablet Take 81 mg by mouth at bedtime.       . docusate sodium (COLACE) 100 MG capsule Take 100 mg by mouth at bedtime. Unsure of dose      . Multiple Vitamin (MULTIVITAMIN WITH MINERALS) TABS tablet Take 1 tablet by mouth daily.      Marland Kitchen omeprazole (PRILOSEC) 10 MG capsule Take 1 capsule (10 mg total) by mouth 2 (two) times daily.  180 capsule  3  . sildenafil (VIAGRA) 100 MG tablet Take 1 tablet (100 mg total) by mouth daily as needed.  6 tablet  11  . simvastatin (ZOCOR) 20 MG tablet Take 1 tablet (20 mg total) by mouth at bedtime.  100 tablet  3   No current facility-administered medications for this visit.   Allergies as of 02/19/2013  . (No Known Allergies)    reports that he quit smoking about 51 years ago. He has never used smokeless tobacco. He reports that he does not drink alcohol or use illicit drugs.     Review of Systems: He complains of numbness in his hands and feet.  Pertinent positive and negative review of systems were noted in the above HPI section. All other review of systems were otherwise negative.  Vital signs were reviewed in today's medical  record Physical Exam: General: Well developed , well nourished, no acute distress Skin: anicteric Head: Normocephalic and atraumatic Eyes:  sclerae anicteric, EOMI Ears: Normal auditory acuity Mouth: No deformity or lesions Neck: Supple, no masses or thyromegaly Lungs: Clear throughout to auscultation Heart: Regular rate and rhythm; no murmurs, rubs or bruits Abdomen: Soft, non tender and non distended. No masses, hepatosplenomegaly or hernias noted. Normal Bowel sounds Rectal:deferred Musculoskeletal: Symmetrical with no gross deformities  Skin: No lesions on visible extremities Pulses:  Normal pulses noted Extremities: No clubbing, cyanosis, edema or deformities noted Neurological: Alert oriented x 4, grossly nonfocal Cervical Nodes:  No significant cervical adenopathy Inguinal Nodes: No significant inguinal adenopathy Psychological:  Alert and cooperative. Normal mood and affect  See Assessment and Plan under Problem List

## 2013-02-19 NOTE — Telephone Encounter (Signed)
Talked to pt and gave him appt for 2/16 lab and ML, appt for 2/19th lab cancelled  per Ophelia Shoulder, RN

## 2013-02-19 NOTE — Assessment & Plan Note (Signed)
Iron deficiency anemia and Hemoccult-positive stool strongly suggest a chronic GI bleeding source.  Recommendations #1 colonoscopy and upper endoscopy-to be done at the same sitting #2 repeat capsule study if one is not diagnostic

## 2013-02-19 NOTE — Assessment & Plan Note (Signed)
In view of family history raising the question of Lynch syndrome patient will undergo yearly endoscopy and colonoscopy despite negative genetic studies

## 2013-02-19 NOTE — Assessment & Plan Note (Signed)
Asymptomatic since dilatation therapy

## 2013-02-20 ENCOUNTER — Other Ambulatory Visit: Payer: Medicare Other

## 2013-02-21 ENCOUNTER — Telehealth: Payer: Self-pay | Admitting: *Deleted

## 2013-02-21 NOTE — Telephone Encounter (Signed)
Saw Dr. Deatra Ina on 1/12 and is scheduled for endoscopy/colonoscopy on 03/07/13. Asking if he should start the ferrous sulfate 325 mg bid now or wait till after the procedure?

## 2013-02-22 NOTE — Telephone Encounter (Signed)
Left VM for patient to begin his ferrous sulfate after his colonoscopy-per Dr. Benay Spice.

## 2013-02-23 ENCOUNTER — Encounter: Payer: Self-pay | Admitting: Gastroenterology

## 2013-02-27 ENCOUNTER — Encounter: Payer: Medicare Other | Admitting: Family Medicine

## 2013-03-07 ENCOUNTER — Encounter: Payer: Self-pay | Admitting: Gastroenterology

## 2013-03-07 ENCOUNTER — Ambulatory Visit (AMBULATORY_SURGERY_CENTER): Payer: Medicare Other | Admitting: Gastroenterology

## 2013-03-07 VITALS — BP 133/70 | HR 72 | Temp 98.1°F | Resp 14 | Ht 71.75 in | Wt 173.0 lb

## 2013-03-07 DIAGNOSIS — R195 Other fecal abnormalities: Secondary | ICD-10-CM

## 2013-03-07 DIAGNOSIS — D509 Iron deficiency anemia, unspecified: Secondary | ICD-10-CM

## 2013-03-07 DIAGNOSIS — D126 Benign neoplasm of colon, unspecified: Secondary | ICD-10-CM

## 2013-03-07 MED ORDER — SODIUM CHLORIDE 0.9 % IV SOLN: 500.0000 mL | INTRAVENOUS | Status: DC

## 2013-03-07 NOTE — Op Note (Signed)
[place]  [address]. [place], [address]   COLONOSCOPY PROCEDURE REPORT  PATIENT: [name], [name]  MR#: [id number] BIRTHDATE: [birth year] , 72  yrs. old GENDER: Male ENDOSCOPIST: Inda Castle, MD REFERRED BY: PROCEDURE DATE:  03/07/2013 PROCEDURE:   Colonoscopy with biopsy First Screening Colonoscopy - Avg.  risk and is 50 yrs.  old or older - No.  Prior Negative Screening - Now for repeat screening. N/A  History of Adenoma - Now for follow-up colonoscopy & has been > or = to 3 yrs.  N/A  Polyps Removed Today? No.  Recommend repeat exam, <10 yrs? Yes.  High risk (family or personal hx). ASA CLASS:   Class II INDICATIONS:heme-positive stool and Iron Deficiency Anemia, personal history of colon cancer 2013 MEDICATIONS: MAC sedation, administered by CRNA and propofol (Diprivan) 100mg  IV  DESCRIPTION OF PROCEDURE:   After the risks benefits and alternatives of the procedure were thoroughly explained, informed consent was obtained.  A digital rectal exam revealed no abnormalities of the rectum.   The LB [id number]  endoscope was introduced through the anus and advanced to the surgical anastomosis. No adverse events experienced.   The quality of the prep was excellent using Suprep  The instrument was then slowly withdrawn as the colon was fully examined.      COLON FINDINGS: At the anastomosis along the suture line there were several areas of prominent mucosa.  At the proximal end it was not clear whether it was an area of prominent mucosa corresponding to the suture line or whether it represented a possible adenoma. Biopsies were taken.   Moderate diverticulosis was noted in the sigmoid colon.   Internal hemorrhoids were found.  Retroflexed views revealed no abnormalities. The time to cecum=3 minutes 50 seconds.  Withdrawal time=7 minutes 12 seconds.  The scope was withdrawn and the procedure completed. COMPLICATIONS: There were no  complications.  ENDOSCOPIC IMPRESSION: 1.   At the anastomosis along the suture line there were several areas of prominent mucosa.  At the proximal end it was not clear whether it was an area of prominent mucosa corresponding to the suture line or whether it represented a possible adenoma.  Biopsies were taken. 2.   Moderate diverticulosis was noted in the sigmoid colon 3.   Internal hemorrhoids  No definite source for GI blood loss was identified.  RECOMMENDATIONS: upper endoscopy  eSigned:  Inda Castle, MD 03/07/2013 3:48 PM   cc: Dorena Cookey, MD and Patria Mane MD   PATIENT NAME:  [name], [name] MR#: [id number]

## 2013-03-07 NOTE — Patient Instructions (Signed)
Discharge instructions given with verbal understanding. Handouts on diverticulosis and hemorrhoids. Office will schedule capsule endoscopy. Resume previous medications. YOU HAD AN ENDOSCOPIC PROCEDURE TODAY AT [facility]: Refer to the procedure report that was given to you for any specific questions about what was found during the examination.  If the procedure report does not answer your questions, please call your gastroenterologist to clarify.  If you requested that your care partner not be given the details of your procedure findings, then the procedure report has been included in a sealed envelope for you to review at your convenience later.  YOU SHOULD EXPECT: Some feelings of bloating in the abdomen. Passage of more gas than usual.  Walking can help get rid of the air that was put into your GI tract during the procedure and reduce the bloating. If you had a lower endoscopy (such as a colonoscopy or flexible sigmoidoscopy) you may notice spotting of blood in your stool or on the toilet paper. If you underwent a bowel prep for your procedure, then you may not have a normal bowel movement for a few days.  DIET: Your first meal following the procedure should be a light meal and then it is ok to progress to your normal diet.  A half-sandwich or bowl of soup is an example of a good first meal.  Heavy or fried foods are harder to digest and may make you feel nauseous or bloated.  Likewise meals heavy in dairy and vegetables can cause extra gas to form and this can also increase the bloating.  Drink plenty of fluids but you should avoid alcoholic beverages for 24 hours.  ACTIVITY: Your care partner should take you home directly after the procedure.  You should plan to take it easy, moving slowly for the rest of the day.  You can resume normal activity the day after the procedure however you should NOT DRIVE or use heavy machinery for 24 hours (because of the sedation medicines used during  the test).    SYMPTOMS TO REPORT IMMEDIATELY: A gastroenterologist can be reached at any hour.  During normal business hours, 8:30 AM to 5:00 PM Monday through Friday, call [phone].  After hours and on weekends, please call the GI answering service at [phone] who will take a message and have the physician on call contact you.   Following lower endoscopy (colonoscopy or flexible sigmoidoscopy):  Excessive amounts of blood in the stool  Significant tenderness or worsening of abdominal pains  Swelling of the abdomen that is new, acute  Fever of 100F or higher  Following upper endoscopy (EGD)  Vomiting of blood or coffee ground material  New chest pain or pain under the shoulder blades  Painful or persistently difficult swallowing  New shortness of breath  Fever of 100F or higher  Black, tarry-looking stools  FOLLOW UP: If any biopsies were taken you will be contacted by phone or by letter within the next 1-3 weeks.  Call your gastroenterologist if you have not heard about the biopsies in 3 weeks.  Our staff will call the home number listed on your records the next business day following your procedure to check on you and address any questions or concerns that you may have at that time regarding the information given to you following your procedure. This is a courtesy call and so if there is no answer at the home number and we have not heard from you through the emergency physician on call, we  will assume that you have returned to your regular daily activities without incident.  SIGNATURES/CONFIDENTIALITY: You and/or your care partner have signed paperwork which will be entered into your electronic medical record.  These signatures attest to the fact that that the information above on your After Visit Summary has been reviewed and is understood.  Full responsibility of the confidentiality of this discharge information lies with you and/or your care-partner.

## 2013-03-07 NOTE — Progress Notes (Signed)
Called to room to assist during endoscopic procedure.  Patient ID and intended procedure confirmed with present staff. Received instructions for my participation in the procedure from the performing physician.  

## 2013-03-07 NOTE — Progress Notes (Signed)
Lidocaine-40mg IV prior to Propofol InductionPropofol given over incremental dosages 

## 2013-03-07 NOTE — Op Note (Signed)
[place]  [address]. [place], [address]   ENDOSCOPY PROCEDURE REPORT  PATIENT: Russell, [name]  MR#: [id number] BIRTHDATE: 21-Dec-1940 , 72  yrs. old GENDER: Male ENDOSCOPIST: Inda Castle, MD REFERRED BY: PROCEDURE DATE:  03/07/2013 PROCEDURE:  EGD, diagnostic ASA CLASS:     Class II INDICATIONS:  Iron deficiency anemia.   Occult blood positive. MEDICATIONS: There was residual sedation effect present from prior procedure, MAC sedation, administered by CRNA, and propofol (Diprivan) 100mg  IV TOPICAL ANESTHETIC:  DESCRIPTION OF PROCEDURE: After the risks benefits and alternatives of the procedure were thoroughly explained, informed consent was obtained.  The LB [id number] endoscope was introduced through the mouth and advanced to the third portion of the duodenum. Without limitations.  The instrument was slowly withdrawn as the mucosa was fully examined.      The upper, middle and distal third of the esophagus were carefully inspected and no abnormalities were noted.  The z-line was well seen at the GEJ.  The endoscope was pushed into the fundus which was normal including a retroflexed view.  The antrum, gastric body, first and second part of the duodenum were unremarkable. Retroflexed views revealed no abnormalities.     The scope was then withdrawn from the patient and the procedure completed.  COMPLICATIONS: There were no complications. ENDOSCOPIC IMPRESSION: Normal EGD  RECOMMENDATIONS: Capsule endoscopy  REPEAT EXAM:  eSigned:  Inda Castle, MD 03/07/2013 3:51 PM   KZ:[id number] Delora Fuel, MD

## 2013-03-08 ENCOUNTER — Telehealth: Payer: Self-pay | Admitting: *Deleted

## 2013-03-08 NOTE — Telephone Encounter (Signed)
Left message that we called for f/u 

## 2013-03-13 ENCOUNTER — Telehealth: Payer: Self-pay | Admitting: Gastroenterology

## 2013-03-13 NOTE — Telephone Encounter (Signed)
Left message for pt to call back  °

## 2013-03-14 ENCOUNTER — Encounter: Payer: Self-pay | Admitting: Gastroenterology

## 2013-03-15 NOTE — Telephone Encounter (Signed)
Spoke with pt and appts scheduled for capsule teaching and Capsule endo. Pt aware of appts.

## 2013-03-16 ENCOUNTER — Other Ambulatory Visit: Payer: Self-pay

## 2013-03-16 DIAGNOSIS — D649 Anemia, unspecified: Secondary | ICD-10-CM

## 2013-03-20 ENCOUNTER — Ambulatory Visit (INDEPENDENT_AMBULATORY_CARE_PROVIDER_SITE_OTHER): Payer: Medicare Other | Admitting: Gastroenterology

## 2013-03-20 ENCOUNTER — Ambulatory Visit: Payer: Medicare Other | Admitting: Gastroenterology

## 2013-03-20 DIAGNOSIS — R195 Other fecal abnormalities: Secondary | ICD-10-CM

## 2013-03-20 DIAGNOSIS — D509 Iron deficiency anemia, unspecified: Secondary | ICD-10-CM

## 2013-03-20 NOTE — Progress Notes (Signed)
Pt here this am stating he had completed the prep with good results and had his meds at 5:50am with a small amount of water. Pt swallowed capsule with no distress noted. He lay on exam table for 30 minutes and left with diet and activity instructions. Capsule SB3    LOT 2014- 36/26377S     10  Pt back at 4:15pm and pt denied any problems. Equipment was removed and connected to computer. Pt discharged home.

## 2013-03-26 ENCOUNTER — Ambulatory Visit (HOSPITAL_BASED_OUTPATIENT_CLINIC_OR_DEPARTMENT_OTHER): Payer: Medicare Other | Admitting: Nurse Practitioner

## 2013-03-26 ENCOUNTER — Other Ambulatory Visit (HOSPITAL_BASED_OUTPATIENT_CLINIC_OR_DEPARTMENT_OTHER): Payer: Medicare Other

## 2013-03-26 VITALS — BP 130/55 | HR 75 | Temp 97.4°F | Resp 18 | Ht 71.0 in | Wt 178.1 lb

## 2013-03-26 DIAGNOSIS — D509 Iron deficiency anemia, unspecified: Secondary | ICD-10-CM

## 2013-03-26 DIAGNOSIS — I4891 Unspecified atrial fibrillation: Secondary | ICD-10-CM

## 2013-03-26 DIAGNOSIS — D61818 Other pancytopenia: Secondary | ICD-10-CM

## 2013-03-26 DIAGNOSIS — C182 Malignant neoplasm of ascending colon: Secondary | ICD-10-CM

## 2013-03-26 DIAGNOSIS — C180 Malignant neoplasm of cecum: Secondary | ICD-10-CM

## 2013-03-26 DIAGNOSIS — D649 Anemia, unspecified: Secondary | ICD-10-CM

## 2013-03-26 DIAGNOSIS — D696 Thrombocytopenia, unspecified: Secondary | ICD-10-CM

## 2013-03-26 LAB — CBC WITH DIFFERENTIAL/PLATELET
BASO%: 0.6 % (ref 0.0–2.0)
Basophils Absolute: 0 10*3/uL (ref 0.0–0.1)
EOS%: 2.7 % (ref 0.0–7.0)
Eosinophils Absolute: 0.1 10*3/uL (ref 0.0–0.5)
HEMATOCRIT: 32.4 % — AB (ref 38.4–49.9)
HGB: 10.8 g/dL — ABNORMAL LOW (ref 13.0–17.1)
LYMPH%: 32.4 % (ref 14.0–49.0)
MCH: 25.1 pg — ABNORMAL LOW (ref 27.2–33.4)
MCHC: 33.4 g/dL (ref 32.0–36.0)
MCV: 75.1 fL — ABNORMAL LOW (ref 79.3–98.0)
MONO#: 0.3 10*3/uL (ref 0.1–0.9)
MONO%: 8.2 % (ref 0.0–14.0)
NEUT#: 1.9 10*3/uL (ref 1.5–6.5)
NEUT%: 56.1 % (ref 39.0–75.0)
PLATELETS: 118 10*3/uL — AB (ref 140–400)
RBC: 4.32 10*6/uL (ref 4.20–5.82)
RDW: 16.1 % — ABNORMAL HIGH (ref 11.0–14.6)
WBC: 3.4 10*3/uL — AB (ref 4.0–10.3)
lymph#: 1.1 10*3/uL (ref 0.9–3.3)

## 2013-03-26 NOTE — Progress Notes (Addendum)
OFFICE PROGRESS NOTE  Interval history:  [name] returns for follow-up of anemia. He was found to have heme positive stool. He underwent a colonoscopy on 03/07/2013. At the anastomosis along the suture line there were several areas of prominent mucosa. Biopsies were taken. Moderate diverticulosis was noted in the sigmoid colon. Internal hemorrhoids also noted. No definite source for GI blood loss was identified. Biopsy of the anastomosis site showed benign colonic mucosa with inflamed granulation tissue. No adenomatous change or malignancy. He subsequently underwent an upper endoscopy which was normal. He underwent a capsule endoscopy on 03/20/2013.  Energy level is decreased. He denies shortness of breath. No chest pain. He denies any rectal bleeding. No black stools. No nausea or vomiting. No dysphagia. He reports the nodular lesion below the umbilicus is unchanged.   Objective: Filed Vitals:   03/26/13 0928  BP: 130/55  Pulse: 75  Temp: 97.4 F (36.3 C)  Resp: 18   Oropharynx is without thrush or ulceration. No palpable cervical, supraclavicular, axillary or inguinal lymph nodes. Lungs are clear. No wheezes or rales. Regular cardiac rhythm. Abdomen soft and nontender. No hepatosplenomegaly .Umbilical hernia. Approximate 2 cm firm nodular area inferior to the hernia and just lateral to the superior edge of the midline incision on the left. No leg edema.   Lab Results: Lab Results  Component Value Date   WBC 3.4* 03/26/2013   HGB 10.8* 03/26/2013   HCT 32.4* 03/26/2013   MCV 75.1* 03/26/2013   PLT 118* 03/26/2013   NEUTROABS 1.9 03/26/2013    Chemistry:    Chemistry      Component Value Date/Time   NA 130* 01/30/2013 1915   NA 143 11/15/2012 0917   K 3.8 01/30/2013 1915   K 4.3 11/15/2012 0917   CL 96 01/30/2013 1915   CL 107 05/05/2012 1104   CO2 22 01/30/2013 1915   CO2 26 11/15/2012 0917   BUN 10 01/30/2013 1915   BUN 11.5 11/15/2012 0917   CREATININE 0.96 01/30/2013 1915    CREATININE 1.1 11/15/2012 0917   CREATININE 0.92 11/30/2011 1316      Component Value Date/Time   CALCIUM 9.6 01/30/2013 1915   CALCIUM 9.8 11/15/2012 0917   ALKPHOS 138* 02/07/2012 0842   AST 28 02/07/2012 0842   ALT 27 02/07/2012 0842   BILITOT 1.4* 02/07/2012 0842       Studies/Results: No results found.  Medications: I have reviewed the patient's current medications.  Assessment/Plan: 1.Moderately differentiated adenocarcinoma of the cecum, stage IIIB (T4 N1), status post a right colectomy on 04/08/2011 -the tumor was microsatellite stable and K-ras wild-type . He began adjuvant CAPOX chemotherapy on 05/03/2011. He completed cycle 5 on 07/26/2011 (oxaliplatin was held from cycle 4). He completed cycle 7 beginning 09/06/2011. He completed cycle 8 beginning 09/27/2011.  2. History of colon polyps. He underwent a surveillance colonoscopy by Dr. Deatra [name] in February 2014 with benign findings.  3. History of anemia secondary to iron deficiency and chemotherapy. The hemoglobin was higher 11/15/2012. Oral iron was discontinued.   4. Postoperative abdominal wall/flank hematoma.  5. Sleep apnea.  6. History of atrial fibrillation.  7. History of esophageal stricture.  8. Family history of colon cancer and gastric cancer. The tumor was microsatellite stable and no mutation was found in the mismatch repair genes. A CancerNext panel was negative.  9. 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  11. 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  He continues to have mild pancytopenia.  12. Abdominal wall hernia noted on a CT of the abdomen 12/03/2011.  13. Splenomegaly-noted on CT scans back to January of 2013. 14. Stool positive for occult blood 02/12/2013. Ferritin 38 02/14/2013. Colonoscopy and upper endoscopy 03/07/2013 with no source for GI blood loss identified. Capsule endoscopy  03/20/2013 with result pending. 15. Colonoscopy on 03/07/2013. At the anastomosis along the suture line there were several areas of prominent mucosa. Biopsies were taken. Moderate diverticulosis was noted in the sigmoid colon. Internal hemorrhoids also noted. No definite source for GI blood loss was identified. Biopsy of the anastomosis site showed benign colonic mucosa with inflamed granulation tissue. No adenomatous change or malignancy. He subsequently underwent an upper endoscopy (03/07/2013) which was normal.  16. Review of peripheral blood smear 01/30/2013-few metamyelocytes and myelocytes, mild decrease in platelets, normal red blood cells.   Dispositon-[name] appears stable. He remains in clinical remission from colon cancer. He has a progressive microcytic anemia in the setting of heme positive stool and negative GI evaluation to date including colonoscopy and upper endoscopy. Capsule endoscopy result is pending.   He will begin ferrous sulfate 325 mg 3 times daily and return for a followup CBC in 6 weeks. If the hemoglobin is not improved we will likely need to proceed with a bone marrow biopsy.  His next scheduled office visit is on 06/11/2013. We will adjust that appointment accordingly pending the CBC result in 6 weeks.  The nodule at the lower abdomen is likely benign scar tissue. He will contact the office if he notes any change.   Patient seen with Dr. Benay [name]. 25 minutes were spent face-to-face at today's visit with the majority of the time involved in counseling/coordination of care.   [name] ANP/GNP-BC  This was a shared visit with [name]. [name] was examined. He has persistent microcytic anemia and mild thrombocytopenia. He will resume ferrous sulfate. If the hemoglobin does not correct on iron we will consider proceeding with a diagnostic bone marrow biopsy.  [name], M.D.

## 2013-03-26 NOTE — Patient Instructions (Signed)
Ferrous sulfate 325 mg three times daily

## 2013-03-28 ENCOUNTER — Telehealth: Payer: Self-pay | Admitting: Gastroenterology

## 2013-03-28 NOTE — Telephone Encounter (Signed)
Pt states he has not passed the capsule he had done last week. Pt also calling for results. Please advise.

## 2013-03-29 ENCOUNTER — Other Ambulatory Visit: Payer: Medicare Other

## 2013-03-29 NOTE — Telephone Encounter (Signed)
Pt aware and pt will call tomorrow if he has not passed the capsule.

## 2013-03-29 NOTE — Telephone Encounter (Signed)
Since I am in the hospital I have not had an opportunity to review the study.  It may not be until early next week. If he hasn't passed capsule by tomorrow order a KUB

## 2013-03-30 ENCOUNTER — Other Ambulatory Visit: Payer: Self-pay

## 2013-03-30 ENCOUNTER — Ambulatory Visit (INDEPENDENT_AMBULATORY_CARE_PROVIDER_SITE_OTHER)
Admission: RE | Admit: 2013-03-30 | Discharge: 2013-03-30 | Disposition: A | Discharge status: Home / Self Care | Payer: Medicare Other | Source: Ambulatory Visit | Attending: Gastroenterology | Admitting: Gastroenterology

## 2013-03-30 ENCOUNTER — Telehealth: Payer: Self-pay

## 2013-03-30 DIAGNOSIS — T189XXA Foreign body of alimentary tract, part unspecified, initial encounter: Secondary | ICD-10-CM

## 2013-03-30 NOTE — Telephone Encounter (Signed)
Pt states he still does not think he passed the capsule. Pt to come in today for a KUB. Orders in epic.

## 2013-04-03 ENCOUNTER — Other Ambulatory Visit (INDEPENDENT_AMBULATORY_CARE_PROVIDER_SITE_OTHER): Payer: Medicare Other

## 2013-04-03 DIAGNOSIS — Z0000 Encounter for general adult medical examination without abnormal findings: Secondary | ICD-10-CM

## 2013-04-03 LAB — POCT URINALYSIS DIPSTICK
BILIRUBIN UA: NEGATIVE
Blood, UA: NEGATIVE
Glucose, UA: NEGATIVE
KETONES UA: NEGATIVE
LEUKOCYTES UA: NEGATIVE
Nitrite, UA: NEGATIVE
PH UA: 5.5
PROTEIN UA: NEGATIVE
SPEC GRAV UA: 1.025
Urobilinogen, UA: 0.2

## 2013-04-03 LAB — CBC WITH DIFFERENTIAL/PLATELET
BASOS ABS: 0 10*3/uL (ref 0.0–0.1)
BASOS PCT: 0 % (ref 0–1)
EOS ABS: 0.1 10*3/uL (ref 0.0–0.7)
Eosinophils Relative: 2 % (ref 0–5)
HCT: 35.2 % — ABNORMAL LOW (ref 39.0–52.0)
HEMOGLOBIN: 11.9 g/dL — AB (ref 13.0–17.0)
Lymphocytes Relative: 31 % (ref 12–46)
Lymphs Abs: 1.1 10*3/uL (ref 0.7–4.0)
MCH: 25.3 pg — AB (ref 26.0–34.0)
MCHC: 33.8 g/dL (ref 30.0–36.0)
MCV: 74.7 fL — ABNORMAL LOW (ref 78.0–100.0)
MONOS PCT: 8 % (ref 3–12)
Monocytes Absolute: 0.3 10*3/uL (ref 0.1–1.0)
NEUTROS ABS: 2.1 10*3/uL (ref 1.7–7.7)
Neutrophils Relative %: 59 % (ref 43–77)
Platelets: 132 10*3/uL — ABNORMAL LOW (ref 150–400)
RBC: 4.71 MIL/uL (ref 4.22–5.81)
RDW: 17.9 % — AB (ref 11.5–15.5)
WBC: 3.6 10*3/uL — ABNORMAL LOW (ref 4.0–10.5)

## 2013-04-03 LAB — BASIC METABOLIC PANEL
BUN: 12 mg/dL (ref 6–23)
CALCIUM: 9.7 mg/dL (ref 8.4–10.5)
CHLORIDE: 108 meq/L (ref 96–112)
CO2: 28 meq/L (ref 19–32)
CREATININE: 1.15 mg/dL (ref 0.50–1.35)
GLUCOSE: 100 mg/dL — AB (ref 70–99)
Potassium: 4.8 mEq/L (ref 3.5–5.3)
Sodium: 143 mEq/L (ref 135–145)

## 2013-04-03 LAB — HEPATIC FUNCTION PANEL
ALBUMIN: 4.4 g/dL (ref 3.5–5.2)
ALT: 19 U/L (ref 0–53)
AST: 19 U/L (ref 0–37)
Alkaline Phosphatase: 75 U/L (ref 39–117)
BILIRUBIN DIRECT: 0.3 mg/dL (ref 0.0–0.3)
Indirect Bilirubin: 0.8 mg/dL (ref 0.2–1.2)
Total Bilirubin: 1.1 mg/dL (ref 0.2–1.2)
Total Protein: 6.8 g/dL (ref 6.0–8.3)

## 2013-04-03 LAB — LIPID PANEL
CHOL/HDL RATIO: 2.2 ratio
Cholesterol: 103 mg/dL (ref 0–200)
HDL: 46 mg/dL (ref >39)
LDL Cholesterol: 43 mg/dL (ref 0–99)
TRIGLYCERIDES: 68 mg/dL (ref <150)
VLDL: 14 mg/dL (ref 0–40)

## 2013-04-03 NOTE — Addendum Note (Signed)
Addended by: Elmer Picker on: 04/03/2013 01:20 PM   Modules accepted: Orders

## 2013-04-04 LAB — TSH: TSH: 1.326 u[IU]/mL (ref 0.350–4.500)

## 2013-04-04 LAB — PSA: PSA: 3.19 ng/mL (ref <=4.00)

## 2013-04-09 ENCOUNTER — Encounter: Payer: Self-pay | Admitting: Gastroenterology

## 2013-04-10 ENCOUNTER — Encounter: Payer: Self-pay | Admitting: Family Medicine

## 2013-04-10 ENCOUNTER — Ambulatory Visit (INDEPENDENT_AMBULATORY_CARE_PROVIDER_SITE_OTHER): Payer: Medicare Other | Admitting: Family Medicine

## 2013-04-10 VITALS — BP 120/80 | Temp 97.9°F | Ht 72.0 in | Wt 180.0 lb

## 2013-04-10 DIAGNOSIS — C182 Malignant neoplasm of ascending colon: Secondary | ICD-10-CM

## 2013-04-10 DIAGNOSIS — N400 Enlarged prostate without lower urinary tract symptoms: Secondary | ICD-10-CM

## 2013-04-10 DIAGNOSIS — N529 Male erectile dysfunction, unspecified: Secondary | ICD-10-CM

## 2013-04-10 DIAGNOSIS — K219 Gastro-esophageal reflux disease without esophagitis: Secondary | ICD-10-CM

## 2013-04-10 DIAGNOSIS — H908 Mixed conductive and sensorineural hearing loss, unspecified: Secondary | ICD-10-CM

## 2013-04-10 DIAGNOSIS — Z0000 Encounter for general adult medical examination without abnormal findings: Secondary | ICD-10-CM

## 2013-04-10 DIAGNOSIS — D509 Iron deficiency anemia, unspecified: Secondary | ICD-10-CM

## 2013-04-10 DIAGNOSIS — I4891 Unspecified atrial fibrillation: Secondary | ICD-10-CM

## 2013-04-10 DIAGNOSIS — E785 Hyperlipidemia, unspecified: Secondary | ICD-10-CM

## 2013-04-10 DIAGNOSIS — I2510 Atherosclerotic heart disease of native coronary artery without angina pectoris: Secondary | ICD-10-CM

## 2013-04-10 MED ORDER — OMEPRAZOLE 10 MG PO CPDR: 10.0000 mg | DELAYED_RELEASE_CAPSULE | Freq: Two times a day (BID) | ORAL | Status: DC

## 2013-04-10 MED ORDER — SILDENAFIL CITRATE 100 MG PO TABS: 100.0000 mg | ORAL_TABLET | Freq: Every day | ORAL | Status: DC | PRN

## 2013-04-10 MED ORDER — SIMVASTATIN 20 MG PO TABS: 20.0000 mg | ORAL_TABLET | Freq: Every day | ORAL | Status: DC

## 2013-04-10 NOTE — Progress Notes (Signed)
Pre visit review using our clinic review tool, if applicable. No additional management support is needed unless otherwise documented below in the visit note. 

## 2013-04-10 NOTE — Patient Instructions (Signed)
Continue your current medications  Return in one year sooner if any problems 

## 2013-04-10 NOTE — Progress Notes (Signed)
Subjective:    Patient ID: [name], male    DOB: Jan 13, 1941, 73 y.o.   MRN: [id number]  HPI [name] is a 73 year old married male nonsmoker who comes in today for a Medicare wellness examination because of a history of adult ADD, anemia iron deficiency etiology unknown. He recently had a complete GI workup including an upper and lower endoscopy along with the capsule endoscopy. Hemoglobin 2 weeks ago was 10.8 kn 11.9. He's taking iron daily. It sounds like he probably has an AVM somewhere in his GI tract.  Uses Viagra for ED and Zocor 20 mg daily along with an aspirin tablet for hyperlipidemia. Because of the bleeding this advised to stop the aspirin.  He gets routine eye care, dental care, colonoscopy as noted above,  Vaccinations up-to-date  Cognitive function normal he walks on a regular basis home health safety reviewed no issues identified, no guns in the house, he does have a health care power of attorney and living well    Review of Systems  Constitutional: Negative.   HENT: Negative.   Eyes: Negative.   Respiratory: Negative.   Cardiovascular: Negative.   Gastrointestinal: Negative.   Genitourinary: Negative.   Musculoskeletal: Negative.   Skin: Negative.   Neurological: Negative.   Psychiatric/Behavioral: Negative.        Objective:   Physical Exam  Nursing note and vitals reviewed. Constitutional: He is oriented to person, place, and time. He appears well-developed and well-nourished.  HENT:  Head: Normocephalic and atraumatic.  Right Ear: External ear normal.  Left Ear: External ear normal.  Nose: Nose normal.  Mouth/Throat: Oropharynx is clear and moist.  Eyes: Conjunctivae and EOM are normal. Pupils are equal, round, and reactive to light.  Neck: Normal range of motion. Neck supple. No JVD present. No tracheal deviation present. No thyromegaly present.  Cardiovascular: Normal rate, regular rhythm, normal heart sounds and intact distal pulses.  Exam  reveals no gallop and no friction rub.   No murmur heard. No carotid or aortic bruits peripheral pulses 2+ and symmetrical  Pulmonary/Chest: Effort normal and breath sounds normal. No stridor. No respiratory distress. He has no wheezes. He has no rales. He exhibits no tenderness.  Abdominal: Soft. Bowel sounds are normal. He exhibits no distension and no mass. There is no tenderness. There is no rebound and no guarding.  Genitourinary: Rectum normal, prostate normal and penis normal. Guaiac negative stool. No penile tenderness.  Musculoskeletal: Normal range of motion. He exhibits no edema and no tenderness.  Lymphadenopathy:    He has no cervical adenopathy.  Neurological: He is alert and oriented to person, place, and time. He has normal reflexes. No cranial nerve deficit. He exhibits normal muscle tone.  Skin: Skin is warm and dry. No rash noted. No erythema. No pallor.  Total body skin exam normal. He has some garden variety of freckles moles and seborrheic keratosis especially on his back  Psychiatric: He has a normal mood and affect. His behavior is normal. Judgment and thought content normal.          Assessment & Plan:  Healthy male  History of colon cancer now with an iron deficiency anemia positive stool guaiacs and negative endoscopy. However his hemoglobin is coming up with iron one tab twice a day. He's to followup with Dr. Cain Sieve in oncology. Advised him also stopped the aspirin  Erectile dysfunction continue Viagra  Hyperlipidemia continue Zocor  History of adult ADD continue Adderall  Fatigue,,,,,,,,,,, continue Adderall 30 mg  at the direction of his psychiatrist Dr. Loralee Pacas. He a complete workup including studies for sleep apnea all of which were negative. This medication release his daytime fatigue

## 2013-04-24 ENCOUNTER — Telehealth: Payer: Self-pay | Admitting: Gastroenterology

## 2013-04-24 DIAGNOSIS — R195 Other fecal abnormalities: Secondary | ICD-10-CM

## 2013-04-24 NOTE — Telephone Encounter (Signed)
Pt is calling for capsule endo results. Please advise. 

## 2013-04-24 NOTE — Telephone Encounter (Signed)
Capsule study was normal.  He needs repeat stool Hemoccults and a CBC

## 2013-04-24 NOTE — Telephone Encounter (Signed)
Pt aware and orders in epic. 

## 2013-05-01 ENCOUNTER — Ambulatory Visit (INDEPENDENT_AMBULATORY_CARE_PROVIDER_SITE_OTHER): Payer: Medicare Other

## 2013-05-01 DIAGNOSIS — R195 Other fecal abnormalities: Secondary | ICD-10-CM

## 2013-05-01 LAB — CBC WITH DIFFERENTIAL/PLATELET
BASOS ABS: 0 10*3/uL (ref 0.0–0.1)
Basophils Relative: 0.2 % (ref 0.0–3.0)
EOS PCT: 1.5 % (ref 0.0–5.0)
Eosinophils Absolute: 0.1 10*3/uL (ref 0.0–0.7)
HEMATOCRIT: 37.1 % — AB (ref 39.0–52.0)
Hemoglobin: 12.5 g/dL — ABNORMAL LOW (ref 13.0–17.0)
LYMPHS ABS: 0.9 10*3/uL (ref 0.7–4.0)
LYMPHS PCT: 21.8 % (ref 12.0–46.0)
MCHC: 33.7 g/dL (ref 30.0–36.0)
MCV: 82.3 fl (ref 78.0–100.0)
MONOS PCT: 7.1 % (ref 3.0–12.0)
Monocytes Absolute: 0.3 10*3/uL (ref 0.1–1.0)
NEUTROS PCT: 69.4 % (ref 43.0–77.0)
Neutro Abs: 3 10*3/uL (ref 1.4–7.7)
Platelets: 122 10*3/uL — ABNORMAL LOW (ref 150.0–400.0)
RBC: 4.5 Mil/uL (ref 4.22–5.81)
RDW: 22.4 % — ABNORMAL HIGH (ref 11.5–14.6)
WBC: 4.4 10*3/uL — AB (ref 4.5–10.5)

## 2013-05-02 ENCOUNTER — Other Ambulatory Visit: Payer: Medicare Other

## 2013-05-02 ENCOUNTER — Other Ambulatory Visit: Payer: Self-pay | Admitting: *Deleted

## 2013-05-02 DIAGNOSIS — D649 Anemia, unspecified: Secondary | ICD-10-CM

## 2013-05-02 LAB — HEMOCCULT SLIDES (X 3 CARDS)
Fecal Occult Blood: POSITIVE — AB
OCCULT 1: POSITIVE — AB
OCCULT 2: POSITIVE — AB
OCCULT 3: POSITIVE — AB
OCCULT 4: POSITIVE — AB
OCCULT 5: POSITIVE — AB

## 2013-05-08 ENCOUNTER — Other Ambulatory Visit: Payer: Self-pay

## 2013-05-08 DIAGNOSIS — R195 Other fecal abnormalities: Secondary | ICD-10-CM

## 2013-05-15 ENCOUNTER — Other Ambulatory Visit: Payer: Medicare Other

## 2013-05-15 ENCOUNTER — Ambulatory Visit
Admission: RE | Admit: 2013-05-15 | Discharge: 2013-05-15 | Disposition: A | Discharge status: Home / Self Care | Payer: Medicare Other | Source: Ambulatory Visit | Attending: Gastroenterology | Admitting: Gastroenterology

## 2013-05-15 ENCOUNTER — Ambulatory Visit (INDEPENDENT_AMBULATORY_CARE_PROVIDER_SITE_OTHER)
Admission: RE | Admit: 2013-05-15 | Discharge: 2013-05-15 | Disposition: A | Discharge status: Home / Self Care | Payer: Medicare Other | Source: Ambulatory Visit | Attending: Gastroenterology | Admitting: Gastroenterology

## 2013-05-15 ENCOUNTER — Telehealth: Payer: Self-pay | Admitting: *Deleted

## 2013-05-15 DIAGNOSIS — R195 Other fecal abnormalities: Secondary | ICD-10-CM

## 2013-05-15 DIAGNOSIS — D649 Anemia, unspecified: Secondary | ICD-10-CM

## 2013-05-15 MED ORDER — IOHEXOL 300 MG/ML  SOLN
100.0000 mL | Freq: Once | INTRAMUSCULAR | Status: AC | PRN
  Administered 2013-05-15: 100 mL via INTRAVENOUS

## 2013-05-15 NOTE — Telephone Encounter (Signed)
Patient is asking what is next since his workup is negative so far. Please, advise.

## 2013-05-16 MED ORDER — FERROUS SULFATE 325 (65 FE) MG PO TBEC: DELAYED_RELEASE_TABLET | ORAL | Status: DC

## 2013-05-16 NOTE — Telephone Encounter (Signed)
Labs in [place] scheduled on 06/29/13 at 2:15 PM. Left a message for patient to call me.

## 2013-05-16 NOTE — Telephone Encounter (Signed)
Spoke with patient and gave him recommendations. OV scheduled.

## 2013-05-16 NOTE — Telephone Encounter (Signed)
Complete GI workup was negative.  Presumably he may have some vessels in the small intestine that could be bleeding.  Plan no further GI workup at this time. Begin Feosol one tab twice a day Followup office visit and CBC in one month

## 2013-06-11 ENCOUNTER — Other Ambulatory Visit: Payer: Medicare Other

## 2013-06-11 ENCOUNTER — Other Ambulatory Visit (HOSPITAL_BASED_OUTPATIENT_CLINIC_OR_DEPARTMENT_OTHER): Payer: Medicare Other

## 2013-06-11 ENCOUNTER — Ambulatory Visit (HOSPITAL_BASED_OUTPATIENT_CLINIC_OR_DEPARTMENT_OTHER): Payer: Medicare Other | Admitting: Oncology

## 2013-06-11 ENCOUNTER — Ambulatory Visit: Payer: Medicare Other | Admitting: Oncology

## 2013-06-11 VITALS — BP 129/59 | HR 85 | Temp 98.3°F | Resp 18 | Ht 72.0 in | Wt 185.0 lb

## 2013-06-11 DIAGNOSIS — C20 Malignant neoplasm of rectum: Secondary | ICD-10-CM

## 2013-06-11 DIAGNOSIS — D6959 Other secondary thrombocytopenia: Secondary | ICD-10-CM

## 2013-06-11 DIAGNOSIS — K429 Umbilical hernia without obstruction or gangrene: Secondary | ICD-10-CM

## 2013-06-11 DIAGNOSIS — G622 Polyneuropathy due to other toxic agents: Secondary | ICD-10-CM

## 2013-06-11 DIAGNOSIS — D696 Thrombocytopenia, unspecified: Secondary | ICD-10-CM

## 2013-06-11 DIAGNOSIS — I4891 Unspecified atrial fibrillation: Secondary | ICD-10-CM

## 2013-06-11 DIAGNOSIS — C180 Malignant neoplasm of cecum: Secondary | ICD-10-CM

## 2013-06-11 DIAGNOSIS — C182 Malignant neoplasm of ascending colon: Secondary | ICD-10-CM

## 2013-06-11 DIAGNOSIS — Z800 Family history of malignant neoplasm of digestive organs: Secondary | ICD-10-CM

## 2013-06-11 DIAGNOSIS — T451X5A Adverse effect of antineoplastic and immunosuppressive drugs, initial encounter: Secondary | ICD-10-CM

## 2013-06-11 DIAGNOSIS — D702 Other drug-induced agranulocytosis: Secondary | ICD-10-CM

## 2013-06-11 LAB — CBC WITH DIFFERENTIAL/PLATELET
BASO%: 0.7 % (ref 0.0–2.0)
Basophils Absolute: 0 10*3/uL (ref 0.0–0.1)
EOS%: 3 % (ref 0.0–7.0)
Eosinophils Absolute: 0.1 10*3/uL (ref 0.0–0.5)
HCT: 42.1 % (ref 38.4–49.9)
HGB: 14.3 g/dL (ref 13.0–17.1)
LYMPH%: 31 % (ref 14.0–49.0)
MCH: 28.5 pg (ref 27.2–33.4)
MCHC: 33.9 g/dL (ref 32.0–36.0)
MCV: 84.1 fL (ref 79.3–98.0)
MONO#: 0.3 10*3/uL (ref 0.1–0.9)
MONO%: 8.8 % (ref 0.0–14.0)
NEUT#: 1.9 10*3/uL (ref 1.5–6.5)
NEUT%: 56.5 % (ref 39.0–75.0)
Platelets: 98 10*3/uL — ABNORMAL LOW (ref 140–400)
RBC: 5.01 10*6/uL (ref 4.20–5.82)
RDW: 18.7 % — ABNORMAL HIGH (ref 11.0–14.6)
WBC: 3.4 10*3/uL — ABNORMAL LOW (ref 4.0–10.3)
lymph#: 1.1 10*3/uL (ref 0.9–3.3)

## 2013-06-11 LAB — CEA: CEA: 0.9 ng/mL (ref 0.0–5.0)

## 2013-06-11 LAB — CHCC SMEAR

## 2013-06-11 NOTE — Progress Notes (Signed)
[facility] OFFICE PROGRESS NOTE   Diagnosis: Colon cancer  INTERVAL HISTORY:   [name] returns as scheduled. He feels well. He continues iron. He is undergone an extensive diagnostic evaluation for source of blood loss by Dr. Deatra Ina. This has been negative. This included a negative camera endoscopy 03/20/2013.  The umbilical hernia is now painful.  Objective:  Vital signs in last 24 hours:  Blood pressure 129/59, pulse 85, temperature 98.3 F (36.8 C), temperature source Oral, resp. rate 18, height 6' (1.829 m), weight 185 lb (83.915 kg).    HEENT: Neck without mass Lymphatics: No cervical, subclavicular, axillary, or inguinal nodes Resp: Lungs clear bilaterally Cardio: Regular rate and rhythm GI: No hepatosplenomegaly, nontender, no mass. Reducible umbilical hernia with firm nodularity at the inferior aspect. Probable small reducible left inguinal hernia. Vascular: No leg edema  Skin: Multiple lipomas over the trunk and extremities     Lab Results:  Lab Results  Component Value Date   WBC 3.4* 06/11/2013   HGB 14.3 06/11/2013   HCT 42.1 06/11/2013   MCV 84.1 06/11/2013   PLT 98* 06/11/2013   NEUTROABS 1.9 06/11/2013    Lab Results  Component Value Date   CEA 1.0 11/15/2012    Imaging: CTs of the chest, abdomen, and pelvis on 05/15/2013-borderline right hilar lymph node, small fat-containing umbilical hernia, diverticulosis of the descending and sigmoid colon, multiple hepatic cysts Medications: I have reviewed the patient's current medications.  Assessment/Plan: 1.Moderately differentiated adenocarcinoma of the cecum, stage IIIB (T4 N1), status post a right colectomy on 04/08/2011 -the tumor was microsatellite stable and K-ras wild-type . He began adjuvant CAPOX chemotherapy on 05/03/2011. He completed cycle 5 on 07/26/2011 (oxaliplatin was held from cycle 4). He completed cycle 7 beginning 09/06/2011. He completed cycle 8 beginning 09/27/2011.   CTs of  the chest, abdomen, and pelvis 05/15/2013-negative for recurrent colon cancer 2. History of colon polyps. He underwent a surveillance colonoscopy by Dr. Deatra Ina 03/07/2013  3. History of anemia secondary to iron deficiency and chemotherapy. The hemoglobin has improved with iron therapy  4. Postoperative abdominal wall/flank hematoma.  5. Sleep apnea.  6. History of atrial fibrillation.  7. History of esophageal stricture.  8. Family history of colon cancer and gastric cancer. The tumor was microsatellite stable and no mutation was found in the mismatch repair genes. A CancerNext panel was negative.  9. 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  11. 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 Abdominal wall hernia noted on a CT of the abdomen 12/03/2011.  13. Splenomegaly-noted on CT scans back to January of 2013.  14. Stool positive for occult blood 02/12/2013. Ferritin 38 02/14/2013. Colonoscopy and upper endoscopy 03/07/2013 with no source for GI blood loss identified. Capsule endoscopy 03/20/2013 negative. 15. Colonoscopy on 03/07/2013. At the anastomosis along the suture line there were several areas of prominent mucosa. Biopsies were taken. Moderate diverticulosis was noted in the sigmoid colon. Internal hemorrhoids also noted. No definite source for GI blood loss was identified. Biopsy of the anastomosis site showed benign colonic mucosa with inflamed granulation tissue. No adenomatous change or malignancy. He subsequently underwent an upper endoscopy (03/07/2013) which was normal.  16. Review of peripheral blood smear 01/30/2013-few metamyelocytes and myelocytes, mild decrease in platelets, normal red blood cells.   Disposition:  [name] remains in clinical remission from colon cancer. We will followup on the CEA from today. He will  return for an office visit and CEA in 6  months.  He has persistent mild thrombocytopenia. 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 He knows to contact us for spontaneous bleeding or bruising.  [name] will continue iron therapy. He will make an appointment with Dr. Zella Richer to consider repair of the umbilical hernia.  Ladell Pier, MD  06/11/2013  1:45 PM

## 2013-06-14 ENCOUNTER — Telehealth: Payer: Self-pay | Admitting: Oncology

## 2013-06-14 NOTE — Telephone Encounter (Signed)
S/W PT GAVE APPT 11/3 @ 9.15AM. PT VERBALIZED UNDERSTANDING.

## 2013-06-29 ENCOUNTER — Ambulatory Visit (INDEPENDENT_AMBULATORY_CARE_PROVIDER_SITE_OTHER): Payer: Medicare Other | Admitting: Gastroenterology

## 2013-06-29 ENCOUNTER — Encounter: Payer: Self-pay | Admitting: Gastroenterology

## 2013-06-29 VITALS — BP 120/60 | HR 70 | Ht 73.0 in | Wt 186.0 lb

## 2013-06-29 DIAGNOSIS — D509 Iron deficiency anemia, unspecified: Secondary | ICD-10-CM

## 2013-06-29 DIAGNOSIS — R195 Other fecal abnormalities: Secondary | ICD-10-CM

## 2013-06-29 NOTE — Patient Instructions (Signed)
You need to have labs in early August. Follow up in 1 year. CC:  Stevie Kern MD

## 2013-06-29 NOTE — Assessment & Plan Note (Signed)
Workup including upper and lower endoscopy and capsule endoscopy were negative for a GI bleeding source.  Presumably, he is losing blood from AVMs in the small bowel that were not appreciated by capsule study.  Hemoglobin has normalized with supplementary iron.  Recommendations #1 repeat CBC in approximately 10 weeks #2 continue iron supplementation

## 2013-06-29 NOTE — Progress Notes (Signed)
          History of Present Illness:  The patient has returned for followup of Hemoccult-positive stool.  Workup including upper and lower endoscopy and capsule endoscopy was unrevealing for a GI bleeding source.  Hemoccults in January and March, 2015 were positive.  Patient has been taking supplementary iron.  Earlier this month hemoglobin was 14.  Platelets were 98,000 and white count 3.4.  He's had a persistent mild leukopenia and thrombocytopenia for the past year.  He has no GI complaints.    Review of Systems: Pertinent positive and negative review of systems were noted in the above HPI section. All other review of systems were otherwise negative.    Current Medications, Allergies, Past Medical History, Past Surgical History, Family History and Social History were reviewed in [facility] record  Vital signs were reviewed in today's medical record. Physical Exam: General: Well developed , well nourished, no acute distress   See Assessment and Plan under Problem List

## 2013-07-01 ENCOUNTER — Encounter (HOSPITAL_BASED_OUTPATIENT_CLINIC_OR_DEPARTMENT_OTHER): Payer: Self-pay | Admitting: Emergency Medicine

## 2013-07-01 ENCOUNTER — Emergency Department (HOSPITAL_BASED_OUTPATIENT_CLINIC_OR_DEPARTMENT_OTHER)
Admission: EM | Admit: 2013-07-01 | Discharge: 2013-07-01 | Disposition: A | Discharge status: Home / Self Care | Payer: Medicare Other | Attending: Emergency Medicine | Admitting: Emergency Medicine

## 2013-07-01 DIAGNOSIS — K219 Gastro-esophageal reflux disease without esophagitis: Secondary | ICD-10-CM | POA: Insufficient documentation

## 2013-07-01 DIAGNOSIS — Z8701 Personal history of pneumonia (recurrent): Secondary | ICD-10-CM | POA: Insufficient documentation

## 2013-07-01 DIAGNOSIS — Z860100 Personal history of colon polyps, unspecified: Secondary | ICD-10-CM | POA: Insufficient documentation

## 2013-07-01 DIAGNOSIS — IMO0002 Reserved for concepts with insufficient information to code with codable children: Secondary | ICD-10-CM

## 2013-07-01 DIAGNOSIS — Z87891 Personal history of nicotine dependence: Secondary | ICD-10-CM | POA: Insufficient documentation

## 2013-07-01 DIAGNOSIS — G4733 Obstructive sleep apnea (adult) (pediatric): Secondary | ICD-10-CM | POA: Insufficient documentation

## 2013-07-01 DIAGNOSIS — E785 Hyperlipidemia, unspecified: Secondary | ICD-10-CM | POA: Insufficient documentation

## 2013-07-01 DIAGNOSIS — D649 Anemia, unspecified: Secondary | ICD-10-CM | POA: Insufficient documentation

## 2013-07-01 DIAGNOSIS — Z85038 Personal history of other malignant neoplasm of large intestine: Secondary | ICD-10-CM | POA: Insufficient documentation

## 2013-07-01 DIAGNOSIS — Z79899 Other long term (current) drug therapy: Secondary | ICD-10-CM | POA: Insufficient documentation

## 2013-07-01 DIAGNOSIS — M19049 Primary osteoarthritis, unspecified hand: Secondary | ICD-10-CM | POA: Insufficient documentation

## 2013-07-01 DIAGNOSIS — Z9889 Other specified postprocedural states: Secondary | ICD-10-CM | POA: Insufficient documentation

## 2013-07-01 DIAGNOSIS — L03019 Cellulitis of unspecified finger: Secondary | ICD-10-CM | POA: Insufficient documentation

## 2013-07-01 MED ORDER — CEPHALEXIN 500 MG PO CAPS: 500.0000 mg | ORAL_CAPSULE | Freq: Four times a day (QID) | ORAL | Status: DC

## 2013-07-01 MED ORDER — CEPHALEXIN 500 MG PO CAPS
500.0000 mg | ORAL_CAPSULE | Freq: Four times a day (QID) | ORAL | Status: DC
Start: 2013-07-01 — End: 2013-11-23

## 2013-07-01 MED ORDER — SULFAMETHOXAZOLE-TRIMETHOPRIM 800-160 MG PO TABS: 1.0000 | ORAL_TABLET | Freq: Two times a day (BID) | ORAL | Status: DC

## 2013-07-01 NOTE — ED Notes (Signed)
Left middle finger nail bed infection noted with surrounding erythema to cuticle/tissue.

## 2013-07-01 NOTE — ED Notes (Signed)
Left middle finger red and painful around nail. Started appx a week ago.

## 2013-07-01 NOTE — Discharge Instructions (Signed)
Please call your doctor for a followup appointment within 24-48 hours. When you talk to your doctor please let them know that you were seen in the emergency department and have them acquire all of your records so that they can discuss the findings with you and formulate a treatment plan to fully care for your new and ongoing problems. Please call and set-up an appointment with your primary care provider to be reassessed this week Please take medications as prescribed-while on antibiotics please take on a full stomach Please report back to the ED within 48 hours for site to be reassessed Please apply warm soaks obese are to 4 times per day and apply pressure to the area to aid in drainage Please continue monitor symptoms closely if symptoms are to worsen or change (fever greater than 101, chills, chest pain, shortness of breath, difficulty breathing, numbness, tingling, red streaks running down the hand, loss of sensation, fall, injury, worsening changes to pain, weakness) please report back to the ED immediately    Paronychia Paronychia is an inflammatory reaction involving the folds of the skin surrounding the fingernail. This is commonly caused by an infection in the skin around a nail. The most common cause of paronychia is frequent wetting of the hands (as seen with bartenders, food servers, nurses or others who wet their hands). This makes the skin around the fingernail susceptible to infection by bacteria (germs) or fungus. Other predisposing factors are:  Aggressive manicuring.  Nail biting.  Thumb sucking. The most common cause is a staphylococcal (a type of germ) infection, or a fungal (Candida) infection. When caused by a germ, it usually comes on suddenly with redness, swelling, pus and is often painful. It may get under the nail and form an abscess (collection of pus), or form an abscess around the nail. If the nail itself is infected with a fungus, the treatment is usually prolonged and  may require oral medicine for up to one year. Your caregiver will determine the length of time treatment is required. The paronychia caused by bacteria (germs) may largely be avoided by not pulling on hangnails or picking at cuticles. When the infection occurs at the tips of the finger it is called felon. When the cause of paronychia is from the herpes simplex virus (HSV) it is called herpetic whitlow. TREATMENT  When an abscess is present treatment is often incision and drainage. This means that the abscess must be cut open so the pus can get out. When this is done, the following home care instructions should be followed. HOME CARE INSTRUCTIONS   It is important to keep the affected fingers very dry. Rubber or plastic gloves over cotton gloves should be used whenever the hand must be placed in water.  Keep wound clean, dry and dressed as suggested by your caregiver between warm soaks or warm compresses.  Soak in warm water for fifteen to twenty minutes three to four times per day for bacterial infections. Fungal infections are very difficult to treat, so often require treatment for long periods of time.  For bacterial (germ) infections take antibiotics (medicine which kill germs) as directed and finish the prescription, even if the problem appears to be solved before the medicine is gone.  Only take over-the-counter or prescription medicines for pain, discomfort, or fever as directed by your caregiver. SEEK IMMEDIATE MEDICAL CARE IF:  You have redness, swelling, or increasing pain in the wound.  You notice pus coming from the wound.  You have a fever.  You notice a bad smell coming from the wound or dressing. Document Released: 07/21/2000 Document Revised: 04/19/2011 Document Reviewed: 03/22/2008 [facility] Patient Information 2014 [place].

## 2013-07-01 NOTE — ED Provider Notes (Signed)
CSN: [id number]     Arrival date & time 07/01/13  1740 History   First MD Initiated Contact with Patient 07/01/13 1819     Chief Complaint  Patient presents with  . Hand Pain     (Consider location/radiation/quality/duration/timing/severity/associated sxs/prior Treatment) The history is provided by the patient. No language interpreter was used.  [name] is a 72 y/o M with past medical history of diverticulosis, hemorrhoids, hyperlipidemia, colon cancer in remission with chemotherapy back in 2013, arthritis, atrial fibrillation, sleep apnea presenting to the ED with left middle finger pain that started approximately 10 days ago. Patient reported that he noticed that the nailbed of the left middle finger started to become red-reported that he placed triple antibiotic ointment on it and went away. Reported that yesterday the redness and erythema returned. Reported that he noticed mild swelling to the nailbed of the left middle finger. Reported that there is mild pain. Denied injury, bite, fever, chills, numbness, tingling, nausea, vomiting, red streaks, drainage, bleeding. PCP Dr. Sherren Mocha  Past Medical History  Diagnosis Date  . Diverticulosis     Sigmoid colon  . Personal history of colonic polyps 09/17/2009    Tubular adenoma  . History of hemorrhoids   . Hyperlipidemia   . Anemia   . 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 Sleep apnea 03-31-11    dx. 2-3 yrs ago, uses cap nightly  . GERD (gastroesophageal reflux disease) 03-31-11    tx. Omeprazole  . Sleep apnea     dx 2010     . Atrial fibrillation 2006    Dr Tonny Bollman af-no meds  . Pneumonia   . colon ca dx'd 02/2011    colon cancer   Past Surgical History  Procedure Laterality Date  . Inguinal hernia repair    . Cad  2004    Dr Wynonia Lawman  . Colon surgery  04/08/2011    lap patial colectomy  . Cardiac catheterization      around 2004  by Dr Wynonia Lawman  . Portacath placement  04/30/2011    Procedure: INSERTION PORT-A-CATH;   Surgeon: Odis Hollingshead, MD;  Location: [facility];  Service: General;  Laterality: Right;  . Port-a-cath removal  11/09/2011    Procedure: REMOVAL PORT-A-CATH;  Surgeon: Odis Hollingshead, MD;  Location: [facility];  Service: General;  Laterality: N/A;  . Appendectomy     Family History  Problem Relation Age of Onset  . Diabetes Father   . Prostate cancer Father   . Heart attack Father 3  . Heart failure Mother     Congestive heart failure  . Cancer Brother     colon  . Colon cancer Brother 54  . Stomach cancer Sister   . Coronary artery disease Brother     CABG  . Anesthesia problems Neg Hx   . Esophageal cancer Neg Hx   . Colon cancer Other    History  Substance Use Topics  . Smoking status: Former Smoker -- 7 years    Quit date: 03/21/1961  . Smokeless tobacco: Never Used  . Alcohol Use: No    Review of Systems  Constitutional: Negative for fever and chills.  Respiratory: Negative for shortness of breath.   Cardiovascular: Negative for chest pain.  Musculoskeletal: Positive for arthralgias (Left middle finger).  Skin: Positive for color change.  Neurological: Negative for weakness and numbness.  All other systems reviewed and are negative.     Allergies  Review of  patient's allergies indicates no known allergies.  Home Medications   Prior to Admission medications   Medication Sig Start Date End Date Taking? Authorizing Provider  amphetamine-dextroamphetamine (ADDERALL) 10 MG tablet Take 30 mg by mouth daily.  12/30/10   Historical Provider, MD  b complex vitamins tablet Take 1 tablet by mouth daily.    Historical Provider, MD  docusate sodium (COLACE) 100 MG capsule Take 100 mg by mouth at bedtime. Unsure of dose    Historical Provider, MD  ferrous sulfate 325 (65 FE) MG EC tablet Take one po BID 05/16/13   Inda Castle, MD  Multiple Vitamin (MULTIVITAMIN WITH MINERALS) TABS tablet Take 1 tablet by mouth daily.    Historical Provider, MD   omeprazole (PRILOSEC) 10 MG capsule Take 1 capsule (10 mg total) by mouth 2 (two) times daily. 04/10/13   Dorena Cookey, MD  sildenafil (VIAGRA) 100 MG tablet Take 1 tablet (100 mg total) by mouth daily as needed. 04/10/13   Dorena Cookey, MD  simvastatin (ZOCOR) 20 MG tablet Take 1 tablet (20 mg total) by mouth at bedtime. 04/10/13   Dorena Cookey, MD   BP 134/65  Pulse 72  Temp(Src) 97.9 F (36.6 C) (Oral)  Resp 20  Ht 6\' 1"  (1.854 m)  Wt 186 lb (84.369 kg)  BMI 24.55 kg/m2  SpO2 100% Physical Exam  Nursing note and vitals reviewed. Constitutional: He is oriented to person, place, and time. He appears well-developed and well-nourished. No distress.  HENT:  Head: Normocephalic and atraumatic.  Mouth/Throat: Oropharynx is clear and moist. No oropharyngeal exudate.  Eyes: Conjunctivae and EOM are normal. Right eye exhibits no discharge. Left eye exhibits no discharge.  Neck: Normal range of motion. Neck supple. No tracheal deviation present.  Negative neck stiffness Negative nuchal rigidity Negative cervical lymphadenopathy Negative meningeal signs  Cardiovascular: Normal rate, regular rhythm and normal heart sounds.  Exam reveals no friction rub.   No murmur heard. Pulmonary/Chest: Effort normal and breath sounds normal. No respiratory distress. He has no wheezes. He has no rales.  Musculoskeletal: Normal range of motion.  Full ROM to upper and lower extremities without difficulty noted, negative ataxia noted.  Lymphadenopathy:    He has no cervical adenopathy.  Neurological: He is alert and oriented to person, place, and time. No cranial nerve deficit. He exhibits normal muscle tone. Coordination normal.  Cranial nerves III-XII grossly intact Strength 5+/5+ to upper and lower extremities bilaterally with resistance applied, equal distribution noted Strength intact to MCP, PIP, DIP joints of left hand. Sensation intact  Skin: He is not diaphoretic.  Paronychia identified to the  left middle finger. Erythema with mild fluctuance identified. Negative active drainage or bleeding. Small pus pocket identified to the radial aspect of the left middle finger. Negative red streaks. Negative felon noted. Very minimal discomfort upon palpation.  Psychiatric: He has a normal mood and affect. His behavior is normal. Thought content normal.    ED Course  Procedures (including critical care time)  INCISION AND DRAINAGE Performed by: Jamse Mead Consent: Verbal consent obtained. Risks and benefits: risks, benefits and alternatives were discussed Type: paronychia Body area: left middle finger nailbed Incision was made with a scalpel Drainage: blood Patient tolerance: Patient tolerated the procedure well with no immediate complications.  Labs Review Labs Reviewed - No data to display  Imaging Review No results found.   EKG Interpretation None      MDM   Final diagnoses:  Paronychia  Filed Vitals:   07/01/13 1745  BP: 134/65  Pulse: 72  Temp: 97.9 F (36.6 C)  TempSrc: Oral  Resp: 20  Height: 6\' 1"  (1.854 m)  Weight: 186 lb (84.369 kg)  SpO2: 100%    Patient presenting to the ED with paronychia identified to the left middle finger with surrounding erythema, fluctuance, swelling just below the nailbed. Small pus pocket identified to the ulnar aspect of the left middle finger. Mild discomfort upon palpation. Incision and drainage performed with small amount of blood drainage. Patient tolerated procedure well. Discussed case with attending physician, Dr. Cameron Ali who recommended patient to be placed on antibiotics.  Patient stable, afebrile. Patient not septic appearing. Negative focal neurological deficits noted. Strength intact MCP, PIP, DIP joints. Negative red streaks or signs of cellulitic infection. Discharged patient. Discharged patient with antibiotics - Keflex. Discussed with patient to apply warm soaks to the finger. Referred patient to primary care  provider. Discussed with patient to report back to the ED for the finger to be re-assessed within 48 hours. Discussed with patient to closely monitor symptoms and if symptoms are to worsen or change to report back to the ED - strict return instructions given.  Patient agreed to plan of care, understood, all questions answered.   [employer], PA-C 07/02/13 0130

## 2013-07-02 NOTE — ED Provider Notes (Signed)
Medical screening examination/treatment/procedure(s) were performed by non-physician practitioner and as supervising physician I was immediately available for consultation/collaboration.   Neta Ehlers, MD 07/02/13 1201

## 2013-11-23 ENCOUNTER — Emergency Department (HOSPITAL_COMMUNITY): Payer: Medicare Other

## 2013-11-23 ENCOUNTER — Emergency Department (HOSPITAL_COMMUNITY)
Admission: EM | Admit: 2013-11-23 | Discharge: 2013-11-23 | Disposition: A | Discharge status: Home / Self Care | Payer: Medicare Other | Attending: Emergency Medicine | Admitting: Emergency Medicine

## 2013-11-23 ENCOUNTER — Encounter (HOSPITAL_COMMUNITY): Payer: Self-pay | Admitting: Emergency Medicine

## 2013-11-23 DIAGNOSIS — W1839XA Other fall on same level, initial encounter: Secondary | ICD-10-CM | POA: Insufficient documentation

## 2013-11-23 DIAGNOSIS — E785 Hyperlipidemia, unspecified: Secondary | ICD-10-CM | POA: Diagnosis not present

## 2013-11-23 DIAGNOSIS — S8991XA Unspecified injury of right lower leg, initial encounter: Secondary | ICD-10-CM | POA: Diagnosis present

## 2013-11-23 DIAGNOSIS — Y9389 Activity, other specified: Secondary | ICD-10-CM | POA: Diagnosis not present

## 2013-11-23 DIAGNOSIS — D649 Anemia, unspecified: Secondary | ICD-10-CM | POA: Insufficient documentation

## 2013-11-23 DIAGNOSIS — K219 Gastro-esophageal reflux disease without esophagitis: Secondary | ICD-10-CM | POA: Diagnosis not present

## 2013-11-23 DIAGNOSIS — Z87891 Personal history of nicotine dependence: Secondary | ICD-10-CM | POA: Diagnosis not present

## 2013-11-23 DIAGNOSIS — Z79899 Other long term (current) drug therapy: Secondary | ICD-10-CM | POA: Diagnosis not present

## 2013-11-23 DIAGNOSIS — Z792 Long term (current) use of antibiotics: Secondary | ICD-10-CM | POA: Diagnosis not present

## 2013-11-23 DIAGNOSIS — Y929 Unspecified place or not applicable: Secondary | ICD-10-CM | POA: Diagnosis not present

## 2013-11-23 DIAGNOSIS — Z86010 Personal history of colonic polyps: Secondary | ICD-10-CM | POA: Insufficient documentation

## 2013-11-23 DIAGNOSIS — M19041 Primary osteoarthritis, right hand: Secondary | ICD-10-CM | POA: Diagnosis not present

## 2013-11-23 DIAGNOSIS — Z8701 Personal history of pneumonia (recurrent): Secondary | ICD-10-CM | POA: Insufficient documentation

## 2013-11-23 DIAGNOSIS — M19042 Primary osteoarthritis, left hand: Secondary | ICD-10-CM | POA: Insufficient documentation

## 2013-11-23 DIAGNOSIS — S72402A Unspecified fracture of lower end of left femur, initial encounter for closed fracture: Secondary | ICD-10-CM | POA: Diagnosis not present

## 2013-11-23 DIAGNOSIS — Z85038 Personal history of other malignant neoplasm of large intestine: Secondary | ICD-10-CM | POA: Diagnosis not present

## 2013-11-23 DIAGNOSIS — S72401A Unspecified fracture of lower end of right femur, initial encounter for closed fracture: Secondary | ICD-10-CM

## 2013-11-23 LAB — I-STAT CHEM 8, ED
BUN: 11 mg/dL (ref 6–23)
Calcium, Ion: 1.2 mmol/L (ref 1.13–1.30)
Chloride: 107 mEq/L (ref 96–112)
Creatinine, Ser: 1.1 mg/dL (ref 0.50–1.35)
Glucose, Bld: 112 mg/dL — ABNORMAL HIGH (ref 70–99)
HEMATOCRIT: 37 % — AB (ref 39.0–52.0)
HEMOGLOBIN: 12.6 g/dL — AB (ref 13.0–17.0)
POTASSIUM: 4 meq/L (ref 3.7–5.3)
SODIUM: 141 meq/L (ref 137–147)
TCO2: 22 mmol/L (ref 0–100)

## 2013-11-23 LAB — TYPE AND SCREEN
ABO/RH(D): B POS
Antibody Screen: NEGATIVE

## 2013-11-23 LAB — BASIC METABOLIC PANEL
Anion gap: 15 (ref 5–15)
BUN: 11 mg/dL (ref 6–23)
CHLORIDE: 108 meq/L (ref 96–112)
CO2: 21 mEq/L (ref 19–32)
Calcium: 9.2 mg/dL (ref 8.4–10.5)
Creatinine, Ser: 0.99 mg/dL (ref 0.50–1.35)
GFR calc Af Amer: >90 mL/min (ref >90)
GFR calc non Af Amer: 80 mL/min — ABNORMAL LOW (ref >90)
Glucose, Bld: 110 mg/dL — ABNORMAL HIGH (ref 70–99)
Potassium: 6.2 mEq/L — ABNORMAL HIGH (ref 3.7–5.3)
Sodium: 144 mEq/L (ref 137–147)

## 2013-11-23 LAB — CBC
HEMATOCRIT: 39.4 % (ref 39.0–52.0)
Hemoglobin: 14.5 g/dL (ref 13.0–17.0)
MCH: 30.9 pg (ref 26.0–34.0)
MCHC: 36.8 g/dL — AB (ref 30.0–36.0)
MCV: 84 fL (ref 78.0–100.0)
Platelets: 145 10*3/uL — ABNORMAL LOW (ref 150–400)
RBC: 4.69 MIL/uL (ref 4.22–5.81)
RDW: 13.6 % (ref 11.5–15.5)
WBC: 8.3 10*3/uL (ref 4.0–10.5)

## 2013-11-23 LAB — PROTIME-INR
INR: 1.16 (ref 0.00–1.49)
Prothrombin Time: 15 seconds (ref 11.6–15.2)

## 2013-11-23 MED ORDER — OXYCODONE-ACETAMINOPHEN 5-325 MG PO TABS: 1.0000 | ORAL_TABLET | ORAL | Status: DC | PRN

## 2013-11-23 MED ORDER — HYDROCODONE-ACETAMINOPHEN 5-325 MG PO TABS
1.0000 | ORAL_TABLET | Freq: Once | ORAL | Status: AC
  Administered 2013-11-23: 1 via ORAL
  Filled 2013-11-23: qty 1

## 2013-11-23 NOTE — ED Notes (Signed)
md at bedside  Pt alert and oriented x4. Respirations even and unlabored, bilateral symmetrical rise and fall of chest. Skin warm and dry. In no acute distress. Denies needs.   

## 2013-11-23 NOTE — ED Notes (Signed)
Pt was pushing granddaughter in running stroll and got his foot caught in the big wheel in the front when walking around in and fell landing on right knee. Pt unsure if he twisted his leg when he fell or not. Pt states that he is unable to bear full weight on the RLE.  Pt denies taking any blood thinners.  Pt is wearing a knee brace in triage. Pt states that he did take chemo and last time he had blood checked (approx 6 months ago) he had low WBC count.

## 2013-11-23 NOTE — ED Provider Notes (Signed)
TIME SEEN: 12:40 PM  CHIEF COMPLAINT: Right knee pain  HPI: Patient is a 73 year old male with history of paroxysmal atrial fibrillation not on medication, history of colon cancer in remission with last chemotherapy in 2013, hyperlipidemia who presents emergency department with right knee pain after a fall. He reports that he was getting his 36-month-old granddaughter out of her jogging stroller when his foot got caught on one of the wheels of the stroller causing him to fall to the ground onto his right knee. He denies any head injury or loss of consciousness. Denies chest pain, shortness of breath, palpitations or dizziness that led to his fall. No other injury. No numbness, tingling or focal weakness.  ROS: See HPI Constitutional: no fever  Eyes: no drainage  ENT: no runny nose   Cardiovascular:  no chest pain  Resp: no SOB  GI: no vomiting GU: no dysuria Integumentary: no rash  Allergy: no hives  Musculoskeletal: no leg swelling  Neurological: no slurred speech ROS otherwise negative  PAST MEDICAL HISTORY/PAST SURGICAL HISTORY:  Past Medical History  Diagnosis Date  . Diverticulosis     Sigmoid colon  . Personal history of colonic polyps 09/17/2009    Tubular adenoma  . History of hemorrhoids   . Hyperlipidemia   . Anemia   . 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 Sleep apnea 03-31-11    dx. 2-3 yrs ago, uses cap nightly  . GERD (gastroesophageal reflux disease) 03-31-11    tx. Omeprazole  . Sleep apnea     dx 2010     . Atrial fibrillation 2006    Dr Tonny Bollman af-no meds  . Pneumonia   . colon ca dx'd 02/2011    colon cancer    MEDICATIONS:  Prior to Admission medications   Medication Sig Start Date End Date Taking? Authorizing Provider  amphetamine-dextroamphetamine (ADDERALL) 10 MG tablet Take 30 mg by mouth daily.  12/30/10   Historical Provider, MD  b complex vitamins tablet Take 1 tablet by mouth daily.    Historical Provider, MD  cephALEXin (KEFLEX) 500 MG  capsule Take 1 capsule (500 mg total) by mouth 4 (four) times daily. 07/01/13   Marissa Sciacca, PA-C  docusate sodium (COLACE) 100 MG capsule Take 100 mg by mouth at bedtime. Unsure of dose    Historical Provider, MD  ferrous sulfate 325 (65 FE) MG EC tablet Take one po BID 05/16/13   Inda Castle, MD  Multiple Vitamin (MULTIVITAMIN WITH MINERALS) TABS tablet Take 1 tablet by mouth daily.    Historical Provider, MD  omeprazole (PRILOSEC) 10 MG capsule Take 1 capsule (10 mg total) by mouth 2 (two) times daily. 04/10/13   Dorena Cookey, MD  sildenafil (VIAGRA) 100 MG tablet Take 1 tablet (100 mg total) by mouth daily as needed. 04/10/13   Dorena Cookey, MD  simvastatin (ZOCOR) 20 MG tablet Take 1 tablet (20 mg total) by mouth at bedtime. 04/10/13   Dorena Cookey, MD    ALLERGIES:  No Known Allergies  SOCIAL HISTORY:  History  Substance Use Topics  . Smoking status: Former Smoker -- 7 years    Quit date: 03/21/1961  . Smokeless tobacco: Never Used  . Alcohol Use: No    FAMILY HISTORY: Family History  Problem Relation Age of Onset  . Diabetes Father   . Prostate cancer Father   . Heart attack Father 14  . Heart failure Mother     Congestive heart failure  .  Cancer Brother     colon  . Colon cancer Brother 64  . Stomach cancer Sister   . Coronary artery disease Brother     CABG  . Anesthesia problems Neg Hx   . Esophageal cancer Neg Hx   . Colon cancer Other     EXAM: BP 127/66  Pulse 108  Temp(Src) 98 F (36.7 C) (Oral)  Resp 18  SpO2 97% CONSTITUTIONAL: Alert and oriented and responds appropriately to questions. Well-appearing; well-nourished; GCS 15 HEAD: Normocephalic; atraumatic EYES: Conjunctivae clear, PERRL, EOMI ENT: normal nose; no rhinorrhea; moist mucous membranes; pharynx without lesions noted; no dental injury;no septal hematoma NECK: Supple, no meningismus, no LAD; no midline spinal tenderness, step-off or deformity CARD: RRR; S1 and S2 appreciated; no  murmurs, no clicks, no rubs, no gallops RESP: Normal chest excursion without splinting or tachypnea; breath sounds clear and equal bilaterally; no wheezes, no rhonchi, no rales; chest wall stable, nontender to palpation ABD/GI: Normal bowel sounds; non-distended; soft, non-tender, no rebound, no guarding PELVIS:  stable, nontender to palpation, no leg length discrepancy BACK:  The back appears normal and is non-tender to palpation, there is no CVA tenderness; no midline spinal tenderness, step-off or deformity EXT: Tender to palpation over the right knee diffusely with a large joint effusion, no ligamentous laxity appreciated but exam is limited given patient's swelling, 2+ DP pulses bilaterally, no calf tenderness, compartments are soft, patient is able to flex and extend he normally has some pain with full flexion, Normal ROM in all joints; otherwise extremities are non-tender to palpation; no edema; normal capillary refill; no cyanosis    SKIN: Normal color for age and race; warm NEURO: Moves all extremities equally, sensation to light touch intact diffusely, cranial nerves II through XII intact PSYCH: The patient's mood and manner are appropriate. Grooming and personal hygiene are appropriate.  MEDICAL DECISION MAKING: Patient here with mechanical fall with right knee injury. We'll obtain x-rays. He has been able to bear weight. No sign of ligamentous laxity or tendon injury. No sign of infection. No other injury on exam. We'll give pain medication.  ED PROGRESS: Xray shows comminuted distal right femoral fracture. Discussed with Dr. Erlinda Hong with orthopedics who recommended a CT of his knee. Updated patient with plan. We'll obtain screening labs in case patient needs operative repair. We'll keep him NPO.   2:44 PM  Ct scan reviewed by Dr. Erlinda Hong.  Fracture is only minimally displaced and the displaced area is a nonweightbearing area. He does not feel that surgery would be appropriate for this patient and  could do more harm than good. Discussed this with patient who understands. We'll place him in a knee immobilizer per Dr. Phoebe Sharps recommendation and keep him nonweightbearing with crutches. We'll have her followup with orthopedics next week. Will have him rest, elevate, ice his leg. We'll discharge with pain medication.  3:11 PM  Labs drawn for possible pre-op a potassium greater than 6 is likely secondary to hemolysis. We'll repeat.  3:46 PM  Repeat potassium was 4.0. Will discharge.   SPLINT APPLICATION Date/Time: 3:78 PM Authorized by: Nyra Jabs Consent: Verbal consent obtained. Risks and benefits: risks, benefits and alternatives were discussed Consent given by: patient Splint applied by: orthopedic technician Location details: R knee Splint type: Knee immobilizer  Supplies used: Knee immobilizer  Post-procedure: The splinted body part was neurovascularly unchanged following the procedure. Patient tolerance: Patient tolerated the procedure well with no immediate complications.     [place],  DO 11/23/13 1546

## 2013-11-23 NOTE — ED Notes (Signed)
Bed: UX32 Expected date:  Expected time:  Means of arrival:  Comments: No monitor

## 2013-11-23 NOTE — ED Notes (Signed)
Bed: [facility] Expected date:  Expected time:  Means of arrival:  Comments: 

## 2013-11-23 NOTE — Discharge Instructions (Signed)
Femur Fracture A femur fracture is a complete or incomplete break in the thighbone (femur). This is a serious injury, but is uncommon in sports. Usually the ankle, lower leg, or knee will become injured before the thighbone does.  SYMPTOMS   Severe pain in the thigh, at the time of injury.  Tenderness and inflammation in the thigh.  Bleeding and bruising in the thigh.  Inability to bear weight on the injured leg.  Visible deformity, if the fracture is complete and bone fragments separate enough to distort the leg shape.  Numbness and coldness in the leg and foot, beyond the fracture site, if blood supply is impaired. CAUSES   A fracture results when the force applied to a bone is greater that the bone can withstand. Thighbone fractures often result from a direct hit (trauma).  Indirect stress, caused by twisting or violent muscle contraction. RISK INCREASES WITH:   Contact sports (i.e. football, soccer, hockey), motor sports, and track and field events.  Previous or current bone problems (i.e. osteoporosis, tumors).  Metabolism disorders or hormone problems.  Nutrition deficiency or disorder (i.e. anorexia and bulimia).  Poor strength and flexibility. PREVENTION   Warm up and stretch properly before activity.  Maintain physical fitness:  Muscle strength.  Endurance and flexibility.  Cardiovascular fitness.  Wear proper protective equipment (i.e. thigh pads for football or hockey). PROGNOSIS  This condition can often be cured with proper treatment, though it may take 6 to 8 weeks to heal.  RELATED COMPLICATIONS   Low blood volume (hypovolemic) shock, due to blood loss in the thigh.  Failure of bone to heal (nonunion).  Bone heals in a poor position (malunion).  Increased pressure inside the leg(compartment syndrome), due to injury that disrupts blood supply to the leg and foot and injures the nerves and muscles of the leg and foot (uncommon).  Shortening of the  injured bones.  Increased chance of repeated leg injury.  Stiff hip or knee.  Hindrance of normal bone growth in children.  Risks of surgery: infection, bleeding, injury to nerves (numbness, weakness, paralysis), need for further surgery.  Infection of open fractures (skin broken over fracture site).  Bone forming within the muscle (myositis ossificans).  Longer healing time, if activity is resumed too soon. TREATMENT  Treatment first involves the use of ice and medicine to reduce pain and inflammation. Treatment of thighbone fractures often requires surgery, to allow the bone to heal in proper alignment, and to reduce the risk of possible complications. Surgery often involves placing a metal rod down the center of the bone, or fixing plates and screws over the fracture line. Use of a cast is not common, because the cast would need to involve the stomach, low back, pelvis, and extend to the foot. For adults, traction (applying pressure using a device) is not often advised, due to the need for prolonged bed rest (6 to 8 weeks). In certain cases, bone growth stimulators may be advised. After the bone heals (with or without surgery), stretching and strengthening exercise is needed. Exercises may be done at home or with a therapist. The rod, plate, and screws from surgery are only removed if they cause further discomfort.  MEDICATION   If pain medicine is needed, nonsteroidal anti-inflammatory medicines (aspirin and ibuprofen), or other minor pain relievers (acetaminophen), are often advised.  Do not take pain medicine for 7 days before surgery.  Stronger pain relievers may be prescribed by your caregiver. Use only as directed and only as much  as you need. SEEK MEDICAL CARE IF:   Symptoms get worse or do not improve in 2 weeks, despite treatment.  The following occur after restraint or surgery. (Report any of these signs immediately):  Swelling above or below the fracture site.  Severe,  persistent pain.  Blue or gray skin below the fracture site, especially under the toenails. Numbness or loss of feeling below the fracture site.  New, unexplained symptoms develop. (Drugs used in treatment may produce side effects.) Document Released: 01/25/2005 Document Revised: 04/19/2011 Document Reviewed: 05/09/2008 [facility] Patient Information 2015 [place], [place]. This information is not intended to replace advice given to you by your health care provider. Make sure you discuss any questions you have with your health care provider.   RICE: Routine Care for Injuries The routine care of many injuries includes Rest, Ice, Compression, and Elevation (RICE). HOME CARE INSTRUCTIONS  Rest is needed to allow your body to heal. Routine activities can usually be resumed when comfortable. Injured tendons and bones can take up to 6 weeks to heal. Tendons are the cord-like structures that attach muscle to bone.  Ice following an injury helps keep the swelling down and reduces pain.  Put ice in a plastic bag.  Place a towel between your skin and the bag.  Leave the ice on for 15-20 minutes, 3-4 times a day, or as directed by your health care provider. Do this while awake, for the first 24 to 48 hours. After that, continue as directed by your caregiver.  Compression helps keep swelling down. It also gives support and helps with discomfort. If an elastic bandage has been applied, it should be removed and reapplied every 3 to 4 hours. It should not be applied tightly, but firmly enough to keep swelling down. Watch fingers or toes for swelling, bluish discoloration, coldness, numbness, or excessive pain. If any of these problems occur, remove the bandage and reapply loosely. Contact your caregiver if these problems continue.  Elevation helps reduce swelling and decreases pain. With extremities, such as the arms, hands, legs, and feet, the injured area should be placed near or above the level of the  heart, if possible. SEEK IMMEDIATE MEDICAL CARE IF:  You have persistent pain and swelling.  You develop redness, numbness, or unexpected weakness.  Your symptoms are getting worse rather than improving after several days. These symptoms may indicate that further evaluation or further X-rays are needed. Sometimes, X-rays may not show a small broken bone (fracture) until 1 week or 10 days later. Make a follow-up appointment with your caregiver. Ask when your X-ray results will be ready. Make sure you get your X-ray results. Document Released: 05/09/2000 Document Revised: 01/30/2013 Document Reviewed: 06/26/2010 [facility] Patient Information 2015 [place], [place]. This information is not intended to replace advice given to you by your health care provider. Make sure you discuss any questions you have with your health care provider.

## 2013-11-23 NOTE — ED Notes (Signed)
Pt to CT

## 2013-11-28 ENCOUNTER — Encounter (HOSPITAL_COMMUNITY): Payer: Self-pay | Admitting: Pharmacy Technician

## 2013-11-28 ENCOUNTER — Encounter (HOSPITAL_COMMUNITY): Payer: Self-pay | Admitting: *Deleted

## 2013-11-29 ENCOUNTER — Ambulatory Visit (HOSPITAL_COMMUNITY): Payer: Medicare Other

## 2013-11-29 ENCOUNTER — Inpatient Hospital Stay (HOSPITAL_COMMUNITY)
Admission: RE | Admit: 2013-11-29 | Discharge: 2013-11-30 | DRG: 481 | Disposition: A | Discharge status: Home / Self Care | Payer: Medicare Other | Source: Ambulatory Visit | Attending: Orthopedic Surgery | Admitting: Orthopedic Surgery

## 2013-11-29 ENCOUNTER — Encounter (HOSPITAL_COMMUNITY): Payer: Medicare Other | Admitting: Anesthesiology

## 2013-11-29 ENCOUNTER — Inpatient Hospital Stay (HOSPITAL_COMMUNITY): Payer: Medicare Other

## 2013-11-29 ENCOUNTER — Encounter (HOSPITAL_COMMUNITY): Payer: Self-pay | Admitting: Anesthesiology

## 2013-11-29 ENCOUNTER — Encounter (HOSPITAL_COMMUNITY)
Admission: RE | Disposition: A | Discharge status: Home / Self Care | Payer: Self-pay | Source: Ambulatory Visit | Attending: Orthopedic Surgery

## 2013-11-29 ENCOUNTER — Ambulatory Visit (HOSPITAL_COMMUNITY): Payer: Medicare Other | Admitting: Anesthesiology

## 2013-11-29 DIAGNOSIS — I4891 Unspecified atrial fibrillation: Secondary | ICD-10-CM | POA: Diagnosis present

## 2013-11-29 DIAGNOSIS — S72409A Unspecified fracture of lower end of unspecified femur, initial encounter for closed fracture: Secondary | ICD-10-CM | POA: Diagnosis present

## 2013-11-29 DIAGNOSIS — I2510 Atherosclerotic heart disease of native coronary artery without angina pectoris: Secondary | ICD-10-CM | POA: Diagnosis present

## 2013-11-29 DIAGNOSIS — Z01811 Encounter for preprocedural respiratory examination: Secondary | ICD-10-CM

## 2013-11-29 DIAGNOSIS — Z85038 Personal history of other malignant neoplasm of large intestine: Secondary | ICD-10-CM | POA: Diagnosis not present

## 2013-11-29 DIAGNOSIS — S72401A Unspecified fracture of lower end of right femur, initial encounter for closed fracture: Secondary | ICD-10-CM | POA: Diagnosis present

## 2013-11-29 DIAGNOSIS — K219 Gastro-esophageal reflux disease without esophagitis: Secondary | ICD-10-CM | POA: Diagnosis present

## 2013-11-29 DIAGNOSIS — N400 Enlarged prostate without lower urinary tract symptoms: Secondary | ICD-10-CM | POA: Diagnosis present

## 2013-11-29 DIAGNOSIS — Z87891 Personal history of nicotine dependence: Secondary | ICD-10-CM

## 2013-11-29 DIAGNOSIS — E785 Hyperlipidemia, unspecified: Secondary | ICD-10-CM | POA: Diagnosis present

## 2013-11-29 DIAGNOSIS — S72421A Displaced fracture of lateral condyle of right femur, initial encounter for closed fracture: Principal | ICD-10-CM | POA: Diagnosis present

## 2013-11-29 DIAGNOSIS — G4733 Obstructive sleep apnea (adult) (pediatric): Secondary | ICD-10-CM | POA: Diagnosis present

## 2013-11-29 DIAGNOSIS — D62 Acute posthemorrhagic anemia: Secondary | ICD-10-CM | POA: Diagnosis not present

## 2013-11-29 DIAGNOSIS — C182 Malignant neoplasm of ascending colon: Secondary | ICD-10-CM

## 2013-11-29 DIAGNOSIS — W19XXXA Unspecified fall, initial encounter: Secondary | ICD-10-CM | POA: Diagnosis present

## 2013-11-29 DIAGNOSIS — S7291XA Unspecified fracture of right femur, initial encounter for closed fracture: Secondary | ICD-10-CM

## 2013-11-29 HISTORY — DX: Pure hypercholesterolemia, unspecified: E78.00

## 2013-11-29 HISTORY — DX: Unspecified fracture of lower end of unspecified femur, initial encounter for closed fracture: S72.409A

## 2013-11-29 HISTORY — PX: ORIF FEMUR FRACTURE: SHX2119

## 2013-11-29 HISTORY — DX: Obstructive sleep apnea (adult) (pediatric): G47.33

## 2013-11-29 HISTORY — DX: Unspecified abdominal hernia without obstruction or gangrene: K46.9

## 2013-11-29 HISTORY — DX: Obstructive sleep apnea (adult) (pediatric): Z99.89

## 2013-11-29 HISTORY — DX: Unspecified intracranial injury with loss of consciousness of unspecified duration, initial encounter: S06.9X9A

## 2013-11-29 HISTORY — PX: ORIF DISTAL FEMUR FRACTURE: SUR926

## 2013-11-29 LAB — CBC WITH DIFFERENTIAL/PLATELET
BASOS PCT: 0 % (ref 0–1)
Basophils Absolute: 0 10*3/uL (ref 0.0–0.1)
Eosinophils Absolute: 0.1 10*3/uL (ref 0.0–0.7)
Eosinophils Relative: 3 % (ref 0–5)
HEMATOCRIT: 32.7 % — AB (ref 39.0–52.0)
Hemoglobin: 11.6 g/dL — ABNORMAL LOW (ref 13.0–17.0)
Lymphocytes Relative: 21 % (ref 12–46)
Lymphs Abs: 0.8 10*3/uL (ref 0.7–4.0)
MCH: 30.6 pg (ref 26.0–34.0)
MCHC: 35.5 g/dL (ref 30.0–36.0)
MCV: 86.3 fL (ref 78.0–100.0)
MONO ABS: 0.3 10*3/uL (ref 0.1–1.0)
MONOS PCT: 8 % (ref 3–12)
Neutro Abs: 2.5 10*3/uL (ref 1.7–7.7)
Neutrophils Relative %: 68 % (ref 43–77)
Platelets: 120 10*3/uL — ABNORMAL LOW (ref 150–400)
RBC: 3.79 MIL/uL — AB (ref 4.22–5.81)
RDW: 13.7 % (ref 11.5–15.5)
WBC: 3.7 10*3/uL — ABNORMAL LOW (ref 4.0–10.5)

## 2013-11-29 LAB — PROTIME-INR
INR: 1.2 (ref 0.00–1.49)
PROTHROMBIN TIME: 15.3 s — AB (ref 11.6–15.2)

## 2013-11-29 LAB — CBC
HCT: 32.4 % — ABNORMAL LOW (ref 39.0–52.0)
HEMOGLOBIN: 11.7 g/dL — AB (ref 13.0–17.0)
MCH: 30.2 pg (ref 26.0–34.0)
MCHC: 36.1 g/dL — ABNORMAL HIGH (ref 30.0–36.0)
MCV: 83.7 fL (ref 78.0–100.0)
Platelets: 110 10*3/uL — ABNORMAL LOW (ref 150–400)
RBC: 3.87 MIL/uL — AB (ref 4.22–5.81)
RDW: 13.6 % (ref 11.5–15.5)
WBC: 4.6 10*3/uL (ref 4.0–10.5)

## 2013-11-29 LAB — COMPREHENSIVE METABOLIC PANEL
ALBUMIN: 3.5 g/dL (ref 3.5–5.2)
ALK PHOS: 74 U/L (ref 39–117)
ALT: 18 U/L (ref 0–53)
AST: 18 U/L (ref 0–37)
Anion gap: 10 (ref 5–15)
BILIRUBIN TOTAL: 2.1 mg/dL — AB (ref 0.3–1.2)
BUN: 17 mg/dL (ref 6–23)
CO2: 24 meq/L (ref 19–32)
CREATININE: 0.99 mg/dL (ref 0.50–1.35)
Calcium: 9 mg/dL (ref 8.4–10.5)
Chloride: 106 mEq/L (ref 96–112)
GFR, EST NON AFRICAN AMERICAN: 80 mL/min — AB (ref >90)
Glucose, Bld: 123 mg/dL — ABNORMAL HIGH (ref 70–99)
POTASSIUM: 4 meq/L (ref 3.7–5.3)
Sodium: 140 mEq/L (ref 137–147)
Total Protein: 6.9 g/dL (ref 6.0–8.3)

## 2013-11-29 LAB — CREATININE, SERUM
CREATININE: 0.92 mg/dL (ref 0.50–1.35)
GFR, EST NON AFRICAN AMERICAN: 82 mL/min — AB (ref >90)

## 2013-11-29 LAB — APTT: aPTT: 36 seconds (ref 24–37)

## 2013-11-29 LAB — TYPE AND SCREEN
ABO/RH(D): B POS
Antibody Screen: NEGATIVE

## 2013-11-29 LAB — ABO/RH: ABO/RH(D): B POS

## 2013-11-29 SURGERY — OPEN REDUCTION INTERNAL FIXATION (ORIF) DISTAL FEMUR FRACTURE
Anesthesia: General | Site: Leg Upper | Laterality: Right

## 2013-11-29 MED ORDER — METOCLOPRAMIDE HCL 5 MG/ML IJ SOLN: 5.0000 mg | Freq: Three times a day (TID) | INTRAMUSCULAR | Status: DC | PRN

## 2013-11-29 MED ORDER — INFLUENZA VAC SPLIT QUAD 0.5 ML IM SUSY
0.5000 mL | PREFILLED_SYRINGE | INTRAMUSCULAR | Status: AC
  Administered 2013-11-30: 0.5 mL via INTRAMUSCULAR
  Filled 2013-11-29: qty 0.5

## 2013-11-29 MED ORDER — CHLORHEXIDINE GLUCONATE 4 % EX LIQD
60.0000 mL | Freq: Once | CUTANEOUS | Status: DC
  Filled 2013-11-29: qty 60

## 2013-11-29 MED ORDER — BUPIVACAINE LIPOSOME 1.3 % IJ SUSP
20.0000 mL | INTRAMUSCULAR | Status: DC
  Filled 2013-11-29: qty 20

## 2013-11-29 MED ORDER — OXYCODONE HCL 5 MG PO TABS
5.0000 mg | ORAL_TABLET | ORAL | Status: DC | PRN
  Administered 2013-11-29 – 2013-11-30 (×4): 10 mg via ORAL
  Administered 2013-11-30: 5 mg via ORAL
  Filled 2013-11-29 (×3): qty 2
  Filled 2013-11-29: qty 1
  Filled 2013-11-29: qty 2

## 2013-11-29 MED ORDER — POTASSIUM CHLORIDE IN NACL 20-0.9 MEQ/L-% IV SOLN
INTRAVENOUS | Status: DC
  Administered 2013-11-29: 18:00:00 via INTRAVENOUS
  Filled 2013-11-29 (×4): qty 1000

## 2013-11-29 MED ORDER — MORPHINE SULFATE 4 MG/ML IJ SOLN
INTRAMUSCULAR | Status: DC | PRN
  Administered 2013-11-29: 4 mg via INTRAVENOUS

## 2013-11-29 MED ORDER — MORPHINE SULFATE 4 MG/ML IJ SOLN
INTRAMUSCULAR | Status: AC
  Filled 2013-11-29: qty 1

## 2013-11-29 MED ORDER — ONDANSETRON HCL 4 MG/2ML IJ SOLN
INTRAMUSCULAR | Status: AC
  Filled 2013-11-29: qty 2

## 2013-11-29 MED ORDER — ADULT MULTIVITAMIN W/MINERALS CH
1.0000 | ORAL_TABLET | Freq: Every evening | ORAL | Status: DC
  Filled 2013-11-29 (×2): qty 1

## 2013-11-29 MED ORDER — NEOSTIGMINE METHYLSULFATE 10 MG/10ML IV SOLN
INTRAVENOUS | Status: DC | PRN
  Administered 2013-11-29: 3 mg via INTRAVENOUS

## 2013-11-29 MED ORDER — HYDROCODONE-ACETAMINOPHEN 7.5-325 MG PO TABS: 1.0000 | ORAL_TABLET | Freq: Four times a day (QID) | ORAL | Status: DC | PRN

## 2013-11-29 MED ORDER — HYDROMORPHONE HCL 1 MG/ML IJ SOLN
0.2500 mg | INTRAMUSCULAR | Status: DC | PRN
  Administered 2013-11-29 (×3): 0.5 mg via INTRAVENOUS

## 2013-11-29 MED ORDER — BISACODYL 5 MG PO TBEC: 5.0000 mg | DELAYED_RELEASE_TABLET | Freq: Every day | ORAL | Status: DC | PRN

## 2013-11-29 MED ORDER — HYDROMORPHONE HCL 1 MG/ML IJ SOLN
INTRAMUSCULAR | Status: AC
  Filled 2013-11-29: qty 1

## 2013-11-29 MED ORDER — FERROUS SULFATE 325 (65 FE) MG PO TABS
325.0000 mg | ORAL_TABLET | Freq: Two times a day (BID) | ORAL | Status: DC
  Administered 2013-11-29 – 2013-11-30 (×3): 325 mg via ORAL
  Filled 2013-11-29 (×4): qty 1

## 2013-11-29 MED ORDER — MIDAZOLAM HCL 2 MG/2ML IJ SOLN
INTRAMUSCULAR | Status: AC
  Filled 2013-11-29: qty 2

## 2013-11-29 MED ORDER — LIDOCAINE HCL (CARDIAC) 20 MG/ML IV SOLN
INTRAVENOUS | Status: AC
  Filled 2013-11-29: qty 5

## 2013-11-29 MED ORDER — ONDANSETRON HCL 4 MG/2ML IJ SOLN
INTRAMUSCULAR | Status: DC | PRN
Start: 2013-11-29 — End: 2013-11-29
  Administered 2013-11-29: 4 mg via INTRAVENOUS

## 2013-11-29 MED ORDER — BUPIVACAINE HCL 0.5 % IJ SOLN
INTRAMUSCULAR | Status: DC | PRN
  Administered 2013-11-29: 5 mL via INTRA_ARTICULAR

## 2013-11-29 MED ORDER — CEFAZOLIN SODIUM-DEXTROSE 2-3 GM-% IV SOLR
2.0000 g | INTRAVENOUS | Status: AC
  Administered 2013-11-29: 2 g via INTRAVENOUS
  Filled 2013-11-29: qty 50

## 2013-11-29 MED ORDER — METHOCARBAMOL 1000 MG/10ML IJ SOLN
500.0000 mg | Freq: Four times a day (QID) | INTRAVENOUS | Status: DC | PRN
  Filled 2013-11-29: qty 5

## 2013-11-29 MED ORDER — METHOCARBAMOL 500 MG PO TABS
ORAL_TABLET | ORAL | Status: AC
  Filled 2013-11-29: qty 1

## 2013-11-29 MED ORDER — DOCUSATE SODIUM 100 MG PO CAPS
100.0000 mg | ORAL_CAPSULE | Freq: Two times a day (BID) | ORAL | Status: DC
  Administered 2013-11-29 – 2013-11-30 (×2): 100 mg via ORAL
  Filled 2013-11-29 (×2): qty 1

## 2013-11-29 MED ORDER — ENOXAPARIN SODIUM 40 MG/0.4ML ~~LOC~~ SOLN
40.0000 mg | SUBCUTANEOUS | Status: DC
  Administered 2013-11-30: 40 mg via SUBCUTANEOUS
  Filled 2013-11-29 (×2): qty 0.4

## 2013-11-29 MED ORDER — SIMVASTATIN 20 MG PO TABS
20.0000 mg | ORAL_TABLET | Freq: Every day | ORAL | Status: DC
  Administered 2013-11-29: 20 mg via ORAL
  Filled 2013-11-29 (×2): qty 1

## 2013-11-29 MED ORDER — LIDOCAINE HCL (CARDIAC) 20 MG/ML IV SOLN
INTRAVENOUS | Status: DC | PRN
  Administered 2013-11-29: 60 mg via INTRAVENOUS

## 2013-11-29 MED ORDER — ACETAMINOPHEN 500 MG PO TABS
1000.0000 mg | ORAL_TABLET | Freq: Once | ORAL | Status: AC
  Administered 2013-11-29: 1000 mg via ORAL
  Filled 2013-11-29: qty 2

## 2013-11-29 MED ORDER — GLYCOPYRROLATE 0.2 MG/ML IJ SOLN
INTRAMUSCULAR | Status: DC | PRN
  Administered 2013-11-29: 0.4 mg via INTRAVENOUS

## 2013-11-29 MED ORDER — CEFAZOLIN SODIUM 1-5 GM-% IV SOLN
1.0000 g | Freq: Four times a day (QID) | INTRAVENOUS | Status: AC
  Administered 2013-11-29 – 2013-11-30 (×3): 1 g via INTRAVENOUS
  Filled 2013-11-29 (×4): qty 50

## 2013-11-29 MED ORDER — MORPHINE SULFATE 2 MG/ML IJ SOLN
1.0000 mg | INTRAMUSCULAR | Status: DC | PRN
  Administered 2013-11-29: 1 mg via INTRAVENOUS
  Filled 2013-11-29: qty 1

## 2013-11-29 MED ORDER — PROPOFOL 10 MG/ML IV BOLUS
INTRAVENOUS | Status: DC | PRN
  Administered 2013-11-29: 180 mg via INTRAVENOUS

## 2013-11-29 MED ORDER — FENTANYL CITRATE 0.05 MG/ML IJ SOLN
INTRAMUSCULAR | Status: AC
  Filled 2013-11-29: qty 5

## 2013-11-29 MED ORDER — GLYCOPYRROLATE 0.2 MG/ML IJ SOLN
INTRAMUSCULAR | Status: AC
  Filled 2013-11-29: qty 2

## 2013-11-29 MED ORDER — METOCLOPRAMIDE HCL 10 MG PO TABS: 5.0000 mg | ORAL_TABLET | Freq: Three times a day (TID) | ORAL | Status: DC | PRN

## 2013-11-29 MED ORDER — MAGNESIUM HYDROXIDE 400 MG/5ML PO SUSP: 30.0000 mL | Freq: Every day | ORAL | Status: DC | PRN

## 2013-11-29 MED ORDER — DEXAMETHASONE SODIUM PHOSPHATE 4 MG/ML IJ SOLN
INTRAMUSCULAR | Status: AC
  Filled 2013-11-29: qty 2

## 2013-11-29 MED ORDER — NEOSTIGMINE METHYLSULFATE 10 MG/10ML IV SOLN
INTRAVENOUS | Status: AC
  Filled 2013-11-29: qty 1

## 2013-11-29 MED ORDER — FENTANYL CITRATE 0.05 MG/ML IJ SOLN
INTRAMUSCULAR | Status: DC | PRN
  Administered 2013-11-29: 50 ug via INTRAVENOUS
  Administered 2013-11-29 (×2): 100 ug via INTRAVENOUS

## 2013-11-29 MED ORDER — ONDANSETRON HCL 4 MG PO TABS: 4.0000 mg | ORAL_TABLET | Freq: Four times a day (QID) | ORAL | Status: DC | PRN

## 2013-11-29 MED ORDER — LACTATED RINGERS IV SOLN
INTRAVENOUS | Status: DC | PRN
  Administered 2013-11-29 (×2): via INTRAVENOUS

## 2013-11-29 MED ORDER — LACTATED RINGERS IV SOLN
INTRAVENOUS | Status: DC
  Administered 2013-11-29: 50 mL/h via INTRAVENOUS

## 2013-11-29 MED ORDER — METHOCARBAMOL 500 MG PO TABS
500.0000 mg | ORAL_TABLET | Freq: Four times a day (QID) | ORAL | Status: DC | PRN
  Administered 2013-11-29 – 2013-11-30 (×2): 500 mg via ORAL
  Filled 2013-11-29 (×2): qty 1

## 2013-11-29 MED ORDER — ROCURONIUM BROMIDE 100 MG/10ML IV SOLN
INTRAVENOUS | Status: DC | PRN
  Administered 2013-11-29: 50 mg via INTRAVENOUS

## 2013-11-29 MED ORDER — DEXAMETHASONE SODIUM PHOSPHATE 4 MG/ML IJ SOLN
INTRAMUSCULAR | Status: DC | PRN
  Administered 2013-11-29: 8 mg via INTRAVENOUS

## 2013-11-29 MED ORDER — ONDANSETRON HCL 4 MG/2ML IJ SOLN: 4.0000 mg | Freq: Four times a day (QID) | INTRAMUSCULAR | Status: DC | PRN

## 2013-11-29 MED ORDER — BUPIVACAINE HCL (PF) 0.5 % IJ SOLN
INTRAMUSCULAR | Status: AC
  Filled 2013-11-29: qty 10

## 2013-11-29 MED ORDER — BUPIVACAINE LIPOSOME 1.3 % IJ SUSP
INTRAMUSCULAR | Status: DC | PRN
  Administered 2013-11-29: 20 mL

## 2013-11-29 MED ORDER — DOCUSATE SODIUM 100 MG PO CAPS
100.0000 mg | ORAL_CAPSULE | Freq: Every day | ORAL | Status: DC | PRN
Start: 2013-11-29 — End: 2013-11-30

## 2013-11-29 MED ORDER — PANTOPRAZOLE SODIUM 40 MG PO TBEC
40.0000 mg | DELAYED_RELEASE_TABLET | Freq: Every day | ORAL | Status: DC
  Administered 2013-11-29 – 2013-11-30 (×2): 40 mg via ORAL
  Filled 2013-11-29 (×2): qty 1

## 2013-11-29 MED ORDER — LACTATED RINGERS IV SOLN: INTRAVENOUS | Status: DC

## 2013-11-29 MED ORDER — MIDAZOLAM HCL 5 MG/5ML IJ SOLN
INTRAMUSCULAR | Status: DC | PRN
  Administered 2013-11-29: 1 mg via INTRAVENOUS

## 2013-11-29 MED ORDER — 0.9 % SODIUM CHLORIDE (POUR BTL) OPTIME
TOPICAL | Status: DC | PRN
  Administered 2013-11-29: 1000 mL

## 2013-11-29 SURGICAL SUPPLY — 69 items
BANDAGE ELASTIC 4 VELCRO ST LF (GAUZE/BANDAGES/DRESSINGS) ×2 IMPLANT
BANDAGE ELASTIC 6 VELCRO ST LF (GAUZE/BANDAGES/DRESSINGS) ×2 IMPLANT
BIT DRILL CALIBRATED 3.2MM (DRILL) ×1 IMPLANT
BIT DRILL GUIDEWIRE 2.5X200 (WIRE) ×6 IMPLANT
BLADE SURG ROTATE 9660 (MISCELLANEOUS) ×2 IMPLANT
BNDG GAUZE ELAST 4 BULKY (GAUZE/BANDAGES/DRESSINGS) ×2 IMPLANT
BRUSH SCRUB DISP (MISCELLANEOUS) ×4 IMPLANT
CANISTER SUCT 3000ML (MISCELLANEOUS) ×2 IMPLANT
COVER SURGICAL LIGHT HANDLE (MISCELLANEOUS) ×2 IMPLANT
DRAPE C-ARM 42X72 X-RAY (DRAPES) ×2 IMPLANT
DRAPE C-ARMOR (DRAPES) ×2 IMPLANT
DRAPE ORTHO SPLIT 77X108 STRL (DRAPES) ×3
DRAPE SURG ORHT 6 SPLT 77X108 (DRAPES) ×3 IMPLANT
DRAPE U-SHAPE 47X51 STRL (DRAPES) ×2 IMPLANT
DRILL CALIBRATED 3.2MM (DRILL) ×2
DRSG ADAPTIC 3X8 NADH LF (GAUZE/BANDAGES/DRESSINGS) ×2 IMPLANT
DRSG PAD ABDOMINAL 8X10 ST (GAUZE/BANDAGES/DRESSINGS) ×6 IMPLANT
ELECT REM PT RETURN 9FT ADLT (ELECTROSURGICAL) ×2
ELECTRODE REM PT RTRN 9FT ADLT (ELECTROSURGICAL) ×1 IMPLANT
EVACUATOR 1/8 PVC DRAIN (DRAIN) IMPLANT
EVACUATOR 3/16  PVC DRAIN (DRAIN)
EVACUATOR 3/16 PVC DRAIN (DRAIN) IMPLANT
GAUZE SPONGE 4X4 12PLY STRL (GAUZE/BANDAGES/DRESSINGS) ×2 IMPLANT
GLOVE BIO SURGEON STRL SZ7.5 (GLOVE) ×2 IMPLANT
GLOVE BIO SURGEON STRL SZ8 (GLOVE) ×2 IMPLANT
GLOVE BIOGEL PI IND STRL 7.5 (GLOVE) ×5 IMPLANT
GLOVE BIOGEL PI IND STRL 8 (GLOVE) ×1 IMPLANT
GLOVE BIOGEL PI INDICATOR 7.5 (GLOVE) ×5
GLOVE BIOGEL PI INDICATOR 8 (GLOVE) ×1
GOWN STRL REUS W/ TWL LRG LVL3 (GOWN DISPOSABLE) ×4 IMPLANT
GOWN STRL REUS W/ TWL XL LVL3 (GOWN DISPOSABLE) ×1 IMPLANT
GOWN STRL REUS W/TWL LRG LVL3 (GOWN DISPOSABLE) ×4
GOWN STRL REUS W/TWL XL LVL3 (GOWN DISPOSABLE) ×1
GUIDEWIRE ORTH 6X062XTROC NS (WIRE) ×1 IMPLANT
K-WIRE .062 (WIRE) ×1
KIT BASIN OR (CUSTOM PROCEDURE TRAY) ×2 IMPLANT
KIT ROOM TURNOVER OR (KITS) ×2 IMPLANT
MANIFOLD NEPTUNE WASTE (CANNULA) ×2 IMPLANT
NEEDLE 22X1 1/2 (OR ONLY) (NEEDLE) ×4 IMPLANT
NS IRRIG 1000ML POUR BTL (IV SOLUTION) ×2 IMPLANT
PACK TOTAL JOINT (CUSTOM PROCEDURE TRAY) ×2 IMPLANT
PAD ARMBOARD 7.5X6 YLW CONV (MISCELLANEOUS) ×4 IMPLANT
PAD CAST 4YDX4 CTTN HI CHSV (CAST SUPPLIES) ×1 IMPLANT
PADDING CAST COTTON 4X4 STRL (CAST SUPPLIES) ×1
PADDING CAST COTTON 6X4 STRL (CAST SUPPLIES) ×2 IMPLANT
PLATE VA-LCP CONDYLAR 4.5 6H (Plate) ×2 IMPLANT
SCREW CANN LOCK 5.0X80 (Screw) ×4 IMPLANT
SCREW CONICAL 5.0MM 85MM (Screw) ×2 IMPLANT
SCREW CONICAL 5.0MM 90MM (Screw) ×2 IMPLANT
SCREW CORTEX 4.5X58MM (Screw) ×2 IMPLANT
SCREW CORTEX ST 4.5X34 (Screw) ×2 IMPLANT
SCREW CORTEX ST 4.5X36 (Screw) ×2 IMPLANT
SCREW CORTEX ST 4.5X44 (Screw) ×2 IMPLANT
SPONGE LAP 18X18 X RAY DECT (DISPOSABLE) ×2 IMPLANT
STAPLER VISISTAT 35W (STAPLE) ×2 IMPLANT
SUCTION FRAZIER TIP 10 FR DISP (SUCTIONS) ×2 IMPLANT
SUT ETHILON 3 0 PS 1 (SUTURE) ×4 IMPLANT
SUT PROLENE 0 CT 2 (SUTURE) IMPLANT
SUT VIC AB 0 CT1 27 (SUTURE)
SUT VIC AB 0 CT1 27XBRD ANBCTR (SUTURE) IMPLANT
SUT VIC AB 1 CT1 27 (SUTURE) ×1
SUT VIC AB 1 CT1 27XBRD ANBCTR (SUTURE) ×1 IMPLANT
SUT VIC AB 2-0 CT1 27 (SUTURE) ×1
SUT VIC AB 2-0 CT1 TAPERPNT 27 (SUTURE) ×1 IMPLANT
SYR 20ML ECCENTRIC (SYRINGE) IMPLANT
TOWEL OR 17X24 6PK STRL BLUE (TOWEL DISPOSABLE) ×2 IMPLANT
TOWEL OR 17X26 10 PK STRL BLUE (TOWEL DISPOSABLE) ×4 IMPLANT
TRAY FOLEY CATH 16FRSI W/METER (SET/KITS/TRAYS/PACK) ×2 IMPLANT
TUBE CONNECTING 12X1/4 (SUCTIONS) ×2 IMPLANT

## 2013-11-29 NOTE — Evaluation (Signed)
Physical Therapy Evaluation Patient Details Name: [name] MRN: [id number] DOB: 21-Nov-1940 Today's Date: 11/29/2013   History of Present Illness  73 y.o. male s/p Open reduction and internal fixation of right distal femur  Clinical Impression  Patient is seen following the above procedure and presents with functional limitations due to the deficits listed below (see PT Problem List). He was able to tolerate gait training up to 35 feet today with close min guard assist and maintains NWB on RLE at all times. Reviewed therapeutic exercises, precautions, and safety with mobility. Plan for stair training in AM. Anticipate he will progress quickly and will have 24 hour care from family at home. Patient will benefit from skilled PT to increase their independence and safety with mobility to allow discharge to the venue listed below.       Follow Up Recommendations Home health PT;Supervision for mobility/OOB    Equipment Recommendations  Rolling walker with 5" wheels;3in1 (PT) (Pt requests w/c only if width (including wheels) <24 inches)    Recommendations for Other Services OT consult     Precautions / Restrictions Precautions Precautions: Fall Restrictions Weight Bearing Restrictions: Yes RLE Weight Bearing: Non weight bearing      Mobility  Bed Mobility Overal bed mobility: Needs Assistance Bed Mobility: Supine to Sit     Supine to sit: Supervision     General bed mobility comments: Supervision for safety. VC for technique. No physical assist needed  Transfers Overall transfer level: Needs assistance Equipment used: Rolling walker (2 wheeled) Transfers: Sit to/from Stand Sit to Stand: Min guard;From elevated surface         General transfer comment: Min guard for safety from slightly elevated bed surface. VC for hand placement. Safely maintains NWB on RLE.  Ambulation/Gait Ambulation/Gait assistance: Min guard Ambulation Distance (Feet): 35 Feet Assistive  device: Rolling walker (2 wheeled) Gait Pattern/deviations:  ("hop-to" pattern)   Gait velocity interpretation: Below normal speed for age/gender General Gait Details: Very slow and guarded but with good stability while using a rolling walker. Educated on safe DME use with a rolling walker. VC for walker placement. Maintains NWB at all times on RLE.  Stairs            Wheelchair Mobility    Modified Rankin (Stroke Patients Only)       Balance Overall balance assessment: Needs assistance Sitting-balance support: No upper extremity supported;Feet supported Sitting balance-Leahy Scale: Good     Standing balance support: Bilateral upper extremity supported Standing balance-Leahy Scale: Poor                               Pertinent Vitals/Pain Pain Assessment: No/denies pain    Home Living Family/patient expects to be discharged to:: Private residence Living Arrangements: Spouse/significant other Available Help at Discharge: Family;Available 24 hours/day Type of Home: House Home Access: Stairs to enter Entrance Stairs-Rails: Psychiatric nurse of Steps: 5 Home Layout: One level Home Equipment: Environmental consultant - standard;Crutches      Prior Function Level of Independence: Independent               Hand Dominance        Extremity/Trunk Assessment   Upper Extremity Assessment: Defer to OT evaluation           Lower Extremity Assessment: RLE deficits/detail RLE Deficits / Details: decreased strength and ROM as expected post op - limited by immobility and WB restrictions  Communication   Communication: No difficulties  Cognition Arousal/Alertness: Awake/alert Behavior During Therapy: WFL for tasks assessed/performed Overall Cognitive Status: Within Functional Limits for tasks assessed                      General Comments      Exercises General Exercises - Lower Extremity Ankle Circles/Pumps: AROM;Both;10  reps;Seated Quad Sets: Strengthening;Right;10 reps;Seated (isometric contraction) Gluteal Sets: Strengthening;Both;10 reps;Seated      Assessment/Plan    PT Assessment Patient needs continued PT services  PT Diagnosis Difficulty walking;Abnormality of gait   PT Problem List Decreased strength;Decreased range of motion;Decreased activity tolerance;Decreased balance;Decreased mobility;Decreased knowledge of use of DME;Pain  PT Treatment Interventions DME instruction;Gait training;Stair training;Functional mobility training;Therapeutic activities;Therapeutic exercise;Balance training;Neuromuscular re-education;Patient/family education;Modalities;Wheelchair mobility training   PT Goals (Current goals can be found in the Care Plan section) Acute Rehab PT Goals Patient Stated Goal: Go home and be independent again. PT Goal Formulation: With patient Time For Goal Achievement: 12/06/13 Potential to Achieve Goals: Good    Frequency Min 5X/week   Barriers to discharge        Co-evaluation               End of Session Equipment Utilized During Treatment: Gait belt Activity Tolerance: Patient tolerated treatment well Patient left: in chair;with call bell/phone within reach;with family/visitor present Nurse Communication: Mobility status         Time: 5056-9794 (- 5 minutes while blood was being drawn by LAB staff) PT Time Calculation (min): 35 min   Charges:   PT Evaluation $Initial PT Evaluation Tier I: 1 Procedure PT Treatments $Therapeutic Activity: 8-22 mins   PT G Codes:         Elayne Snare, [place]  Ellouise Newer 11/29/2013, 5:16 PM

## 2013-11-29 NOTE — Transfer of Care (Signed)
Immediate Anesthesia Transfer of Care Note  Patient: [name]  Procedure(s) Performed: Procedure(s): OPEN REDUCTION INTERNAL FIXATION (ORIF) RIGHT  DISTAL FEMUR FRACTURE (Right)  Patient Location: PACU  Anesthesia Type:General  Level of Consciousness: awake, alert , oriented and patient cooperative  Airway & Oxygen Therapy: Patient Spontanous Breathing and Patient connected to nasal cannula oxygen  Post-op Assessment: Report given to PACU RN, Post -op Vital signs reviewed and stable and Patient moving all extremities X 4  Post vital signs: Reviewed and stable  Complications: No apparent anesthesia complications

## 2013-11-29 NOTE — Anesthesia Postprocedure Evaluation (Signed)
  Anesthesia Post-op Note  Patient: [name]  Procedure(s) Performed: Procedure(s): OPEN REDUCTION INTERNAL FIXATION (ORIF) RIGHT  DISTAL FEMUR FRACTURE (Right)  Patient Location: PACU  Anesthesia Type:General  Level of Consciousness: awake  Airway and Oxygen Therapy: Patient Spontanous Breathing  Post-op Pain: mild  Post-op Assessment: Post-op Vital signs reviewed  Post-op Vital Signs: Reviewed  Last Vitals:  Filed Vitals:   11/29/13 1230  BP: 128/45  Pulse: 73  Temp: 36.6 C  Resp: 20    Complications: No apparent anesthesia complications

## 2013-11-29 NOTE — H&P (Signed)
I saw and examined the patient with Mr. Eddie Dibbles, communicating the findings and plan noted above.  I discussed with the patient the risks and benefits of surgical repair of his right distal femur displaced intra-articular fracture, including the possibility of infection, nerve injury, vessel injury, malunion, nonunion, wound breakdown, arthritis, symptomatic hardware, DVT/ PE, loss of motion, and need for further surgery among others.  He understood these risks and wished to proceed.  [place], MD [facility] [phone] (p)

## 2013-11-29 NOTE — Progress Notes (Signed)
Utilization review completed.  

## 2013-11-29 NOTE — Anesthesia Preprocedure Evaluation (Addendum)
Anesthesia Evaluation  Patient identified by MRN, date of birth, ID band Patient awake    Reviewed: Allergy & Precautions, H&P , NPO status   Airway Mallampati: II TM Distance: >3 FB     Dental  (+) Dental Advisory Given, Teeth Intact   Pulmonary sleep apnea , pneumonia -, former smoker,  breath sounds clear to auscultation        Cardiovascular + CAD Rhythm:Regular Rate:Normal     Neuro/Psych    GI/Hepatic Neg liver ROS, GERD-  ,  Endo/Other    Renal/GU Renal disease     Musculoskeletal   Abdominal   Peds  Hematology   Anesthesia Other Findings   Reproductive/Obstetrics                         Anesthesia Physical Anesthesia Plan  ASA: III  Anesthesia Plan: General   Post-op Pain Management:    Induction: Intravenous  Airway Management Planned: Oral ETT  Additional Equipment:   Intra-op Plan:   Post-operative Plan: Possible Post-op intubation/ventilation  Informed Consent: I have reviewed the patients History and Physical, chart, labs and discussed the procedure including the risks, benefits and alternatives for the proposed anesthesia with the patient or authorized representative who has indicated his/her understanding and acceptance.   Dental advisory given  Plan Discussed with: CRNA, Anesthesiologist and Surgeon  Anesthesia Plan Comments:         Anesthesia Quick Evaluation

## 2013-11-29 NOTE — Op Note (Signed)
[name], [name] NO.:  [id number]  MEDICAL RECORD NO.:  [id number]  LOCATION:  [facility]                         FACILITY:  [facility]  PHYSICIAN:  Astrid Divine. Marcelino Scot, M.D. DATE OF BIRTH:  1940/02/10  DATE OF PROCEDURE: DATE OF DISCHARGE:                              OPERATIVE REPORT   PREOPERATIVE DIAGNOSES:  Right distal femur supracondylar fracture with intercondylar extension.  POSTOPERATIVE DIAGNOSES:  Right distal femur supracondylar fracture with intercondylar extension.  PROCEDURE:  Open reduction and internal fixation of right distal femur with intercondylar extension.  SURGEON:  Astrid Divine. Marcelino Scot, M.D.  ASSISTANT:  Jari Pigg, PA-C  ANESTHESIA:  General.  COMPLICATIONS:  None.  TOURNIQUET:  None.  ESTIMATED BLOOD LOSS:  60 mL.  DISPOSITION:  To PACU.  CONDITION:  Stable.  BRIEF SUMMARY AND INDICATION OF PROCEDURE:  [name] is a very pleasant 73 year old male who sustained a displaced intra-articular distal femur fracture earlier this week.  The patient was seen, evaluated and we discussed the risks and benefits of surgical repair including the possibility of infection, nerve injury, vessel injury, loss of motion, arthritis, DVT, PE, anesthetic complications, and many others.  The patient acknowledged these risks and did wish to proceed with repair.  BRIEF SUMMARY OF PROCEDURE:  [name] was given preoperative antibiotics, taken to the operating room where general anesthesia was induced.  His right lower extremity was prepped and draped in usual sterile fashion.  No tourniquet was used during the procedure.  A 5-cm incision was made extending from the supracondylar region to just proximal of Gerdy's tubercle.  Dissection was carried down through the retinaculum where the hematoma was evacuated.  A right angle clamp was then placed into the fracture site and used to remove the hematoma that had collected between the articular  surface.  No other free fragments were noted in the joint.  It was copiously irrigated to assist with removal of the hematoma and then, a provisional reduction made the plate placed along the lateral distal femur and checked for position on orthogonal views and pinned provisionally to the lateral fragment.  My assistant, [name], then produced a reduction maneuver consisting of traction and varus while I used a [name] clamp placed in the distal screw hole of the plate and through a small stab incision medially against the femoral condyle to oppose the articular surfaces together for a reduction.  This was confirmed radiographically and then, the plate was secured initially in buttress fashion proximally and then with conical screws distally to maximally appose the articular surface as well as the plate to the bone at the distal aspect of the repair, and this was followed by placement of 2 additional lock screws more proximally in the condyle to assist with holding the plate position. Final images showed appropriate reduction, hardware trajectory, and length.  There were no complications.  The knee joint was irrigated thoroughly.  A standard layered closure was performed using 0 Vicryl, 2- 0 Vicryl, and 3-0 nylon and then, Exparel was placed in the subcutaneous tissue and 4 mg of morphine into the knee joint.  The knee injection also included 0.5% Marcaine.  Sterile gently compressive  dressing was applied.  The patient was then awakened from anesthesia and transferred to the PACU in stable condition.  PROGNOSIS:  [name] will be nonweightbearing with unrestricted range of motion of the knee beginning immediately.  We will plan to admit him overnight to work with physical therapy tomorrow prior to discharge.  If he is feeling outstanding and chooses not to stay, he may go home tonight.  We will plan to see him back in the office in 2 weeks for removal of his sutures and he can  begin working with physical therapy as soon as those arrangements can be made.  Because of his intra-articular injury, he is at increased risk for arthritis as well as a loss of motion.  However, all these can be mitigated by the reduction that was achieved today and by early aggressive therapy.     Astrid Divine. Marcelino Scot, M.D.     MHH/MEDQ  D:  11/29/2013  T:  11/29/2013  Job:  [id number]

## 2013-11-29 NOTE — H&P (Signed)
[facility] H&P   Chief Complaint:  R distal femur fracture HPI:   73 y/o white male w/ hx of colon cancer/partial colectomy/Chemo sustained a fall about a week ago falling directly on his R knee.  Pt was walking his grandchild and came into the garage and tripped over the front tire of a jogging stroller. Pt fell directly on his R knee. Pt had immediate onset of pain and inability to bear weight. He want to [facility] for eval and was found to have a R distal femur fracture.  He was discharged from ED and instructed to follow up with ortho.  Pt was referred to our office by Dr. Alvan Dame for surgical treatment.  Pt seen yesterday in office. His fracture was deemed to be surgical and pt presents today for ORIF.   Past Medical History  Diagnosis Date  . Diverticulosis     Sigmoid colon  . Personal history of colonic polyps 09/17/2009    Tubular adenoma  . History of hemorrhoids   . Hyperlipidemia   . Anemia   . 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 Sleep apnea 03-31-11    dx. 2-3 yrs ago, uses cap nightly  . GERD (gastroesophageal reflux disease) 03-31-11    tx. Omeprazole  . Sleep apnea     dx 2010     . Atrial fibrillation 2006    Dr Tonny Bollman af-no meds  . Pneumonia   . colon ca dx'd 02/2011    colon cancer  . Hypercholesterolemia   . Hernia, abdominal     at the umbilicus    Past Surgical History  Procedure Laterality Date  . Inguinal hernia repair    . Cad  2004    Dr Wynonia Lawman  . Colon surgery  04/08/2011    lap patial colectomy  . Cardiac catheterization      around 2004  by Dr Wynonia Lawman  . Portacath placement  04/30/2011    Procedure: INSERTION PORT-A-CATH;  Surgeon: Odis Hollingshead, MD;  Location: [place];  Service: General;  Laterality: Right;  . Port-a-cath removal  11/09/2011    Procedure: REMOVAL PORT-A-CATH;  Surgeon: Odis Hollingshead, MD;  Location: [place];  Service: General;  Laterality: N/A;  . Appendectomy      Family History  Problem  Relation Age of Onset  . Diabetes Father   . Prostate cancer Father   . Heart attack Father 23  . Heart failure Mother     Congestive heart failure  . Cancer Brother     colon  . Colon cancer Brother 28  . Stomach cancer Sister   . Coronary artery disease Brother     CABG  . Anesthesia problems Neg Hx   . Esophageal cancer Neg Hx   . Colon cancer Other    Social History:  reports that he quit smoking about 52 years ago. He has never used smokeless tobacco. He reports that he does not drink alcohol or use illicit drugs.  Allergies: No Known Allergies  Medications Prior to Admission  Medication Sig Dispense Refill  . amphetamine-dextroamphetamine (ADDERALL) 10 MG tablet Take 30 mg by mouth every morning.       Marland Kitchen b complex vitamins tablet Take 1 tablet by mouth every evening.       . docusate sodium (COLACE) 100 MG capsule Take 100 mg by mouth daily as needed for mild constipation.       . ferrous sulfate 325 (65 FE) MG  tablet Take 325 mg by mouth 2 (two) times daily.      Marland Kitchen ibuprofen (ADVIL,MOTRIN) 200 MG tablet Take 400 mg by mouth every 6 (six) hours as needed for moderate pain.      . Multiple Vitamin (MULTIVITAMIN WITH MINERALS) TABS tablet Take 1 tablet by mouth every evening.       Marland Kitchen omeprazole (PRILOSEC) 10 MG capsule Take 1 capsule (10 mg total) by mouth 2 (two) times daily.  180 capsule  3  . oxyCODONE-acetaminophen (PERCOCET/ROXICET) 5-325 MG per tablet Take 1 tablet by mouth every 4 (four) hours as needed (pain).      . simvastatin (ZOCOR) 20 MG tablet Take 1 tablet (20 mg total) by mouth at bedtime.  100 tablet  3    Results for orders placed during the hospital encounter of 11/29/13 (from the past 48 hour(s))  CBC WITH DIFFERENTIAL     Status: Abnormal   Collection Time    11/29/13  7:38 AM      Result Value Ref Range   WBC 3.7 (*) 4.0 - 10.5 K/uL   RBC 3.79 (*) 4.22 - 5.81 MIL/uL   Hemoglobin 11.6 (*) 13.0 - 17.0 g/dL   HCT 32.7 (*) 39.0 - 52.0 %   MCV 86.3  78.0  - 100.0 fL   MCH 30.6  26.0 - 34.0 pg   MCHC 35.5  30.0 - 36.0 g/dL   RDW 13.7  11.5 - 15.5 %   Platelets 120 (*) 150 - 400 K/uL   Neutrophils Relative % 68  43 - 77 %   Neutro Abs 2.5  1.7 - 7.7 K/uL   Lymphocytes Relative 21  12 - 46 %   Lymphs Abs 0.8  0.7 - 4.0 K/uL   Monocytes Relative 8  3 - 12 %   Monocytes Absolute 0.3  0.1 - 1.0 K/uL   Eosinophils Relative 3  0 - 5 %   Eosinophils Absolute 0.1  0.0 - 0.7 K/uL   Basophils Relative 0  0 - 1 %   Basophils Absolute 0.0  0.0 - 0.1 K/uL   Dg Chest 2 View  11/29/2013   CLINICAL DATA:  Preoperative examination for patient for right knee fracture fixation.  EXAM: CHEST  2 VIEW  COMPARISON:  CT chest 05/15/2013.  PA and lateral chest 01/30/2013.  FINDINGS: The lungs are clear. Heart size is normal. There is no pneumothorax or pleural effusion. No focal bony abnormality.  IMPRESSION: Negative chest.   Electronically Signed   By: Inge Rise M.D.   On: 11/29/2013 07:55    Review of Systems  Constitutional: Negative for fever and chills.  Eyes: Negative for blurred vision.  Respiratory: Negative for shortness of breath and wheezing.   Cardiovascular: Negative for chest pain and palpitations.  Gastrointestinal: Negative for nausea, vomiting and abdominal pain.  Genitourinary: Negative for dysuria.  Neurological: Positive for tingling and sensory change.       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 rate 20, SpO2 99.00%. Physical Exam  Constitutional: He is oriented to person, place, and time. Vital signs are normal. He appears well-developed and well-nourished. He is cooperative.  HENT:  Head: Normocephalic and atraumatic.  Cardiovascular: Normal rate, regular rhythm, S1 normal and S2 normal.   Respiratory: Effort normal and breath sounds normal. No respiratory distress. He has no wheezes. He has no rhonchi.  GI:  Surgical  scar NTND, +BS +hernia   Musculoskeletal:  Right Lower Extremity  Inspection:   Knee immobilizer in place   Ace to knee   Extensive ecchymosis proximal posterior thigh and distal leg Bony eval:   Tender distal femur    Patella and proximal tib/fib nontender Soft tissue:   Swelling minimal   Skin wrinkle with gentle compression    No open wounds or lesions  ROM:   Full ankle ROM   Did not assess knee or hip ROM  Sensation:    DPN, SPN,TN sensation grossly intact    Baseline peripheral neuropathy B LEx, unchanged Motor:   EHL, FHL, AT, PT, peroneals, Gastroc motor intact   + quad set Vascular:   + DP pulse    Compartments soft    Neurological: He is alert and oriented to person, place, and time.  Psychiatric: He has a normal mood and affect. His speech is normal and behavior is normal. Judgment and thought content normal. Cognition and memory are normal.     Imaging  CT and xray R knee   Comminuted R lateral femoral condyle fracture with intra-articular extension and extension to the intercondylar notch    Assessment/Plan  73 y/o male s/p fall with R lateral femoral condyle fracture  OR for ORIF  NWB x 6 weeks Unrestricted ROM post op Anticipate overnight observation  Will check vitamin D levels   Jari Pigg, PA-C [facility] [phone] (P)  11/29/2013, 8:06 AM

## 2013-11-29 NOTE — Progress Notes (Signed)
Orthopedic Tech Progress Note Patient Details:  [name] 03-29-1940 [id number]  Ortho Devices Ortho Device/Splint Location: applied ohf to bed Ortho Device/Splint Interventions: Ordered;Application   Braulio Bosch 11/29/2013, 6:34 PM

## 2013-11-30 ENCOUNTER — Encounter (HOSPITAL_COMMUNITY): Payer: Self-pay | Admitting: Orthopedic Surgery

## 2013-11-30 DIAGNOSIS — S72421A Displaced fracture of lateral condyle of right femur, initial encounter for closed fracture: Secondary | ICD-10-CM | POA: Diagnosis not present

## 2013-11-30 LAB — BASIC METABOLIC PANEL
ANION GAP: 13 (ref 5–15)
BUN: 14 mg/dL (ref 6–23)
CHLORIDE: 104 meq/L (ref 96–112)
CO2: 23 meq/L (ref 19–32)
Calcium: 8.7 mg/dL (ref 8.4–10.5)
Creatinine, Ser: 1.01 mg/dL (ref 0.50–1.35)
GFR calc Af Amer: 84 mL/min — ABNORMAL LOW (ref >90)
GFR calc non Af Amer: 72 mL/min — ABNORMAL LOW (ref >90)
Glucose, Bld: 144 mg/dL — ABNORMAL HIGH (ref 70–99)
POTASSIUM: 4.5 meq/L (ref 3.7–5.3)
SODIUM: 140 meq/L (ref 137–147)

## 2013-11-30 LAB — CBC
HCT: 30.8 % — ABNORMAL LOW (ref 39.0–52.0)
Hemoglobin: 11 g/dL — ABNORMAL LOW (ref 13.0–17.0)
MCH: 30.9 pg (ref 26.0–34.0)
MCHC: 35.7 g/dL (ref 30.0–36.0)
MCV: 86.5 fL (ref 78.0–100.0)
PLATELETS: 149 10*3/uL — AB (ref 150–400)
RBC: 3.56 MIL/uL — AB (ref 4.22–5.81)
RDW: 13.8 % (ref 11.5–15.5)
WBC: 7.3 10*3/uL (ref 4.0–10.5)

## 2013-11-30 MED ORDER — DOCUSATE SODIUM 100 MG PO CAPS: 100.0000 mg | ORAL_CAPSULE | Freq: Two times a day (BID) | ORAL | Status: DC

## 2013-11-30 MED ORDER — OXYCODONE HCL 5 MG PO TABS: 5.0000 mg | ORAL_TABLET | Freq: Four times a day (QID) | ORAL | Status: DC | PRN

## 2013-11-30 MED ORDER — HYDROCODONE-ACETAMINOPHEN 7.5-325 MG PO TABS: 1.0000 | ORAL_TABLET | Freq: Four times a day (QID) | ORAL | Status: DC | PRN

## 2013-11-30 MED ORDER — METHOCARBAMOL 500 MG PO TABS: 500.0000 mg | ORAL_TABLET | Freq: Four times a day (QID) | ORAL | Status: DC | PRN

## 2013-11-30 MED ORDER — ENOXAPARIN SODIUM 40 MG/0.4ML ~~LOC~~ SOLN: 40.0000 mg | SUBCUTANEOUS | Status: DC

## 2013-11-30 NOTE — Progress Notes (Signed)
Physical Therapy Treatment Patient Details Name: [name] MRN: [id number] DOB: [birth year] Today's Date: 11/30/2013    History of Present Illness 73 y.o. male s/p Open reduction and internal fixation of right distal femur    PT Comments    Session focused on progressing mobility with pt and address stair ambulation goal. Reviewed multiple strategies with pt. Pt verbalized and demo good understanding of safest technique. Reviewed home setup with pt for safe mobility. Pt will need RW but then is safe from mobility standpoint to D/C home.   Follow Up Recommendations  Home health PT;Supervision for mobility/OOB     Equipment Recommendations  Rolling walker with 5" wheels;3in1 (PT)    Recommendations for Other Services       Precautions / Restrictions Precautions Precautions: None Restrictions Weight Bearing Restrictions: Yes RLE Weight Bearing: Non weight bearing    Mobility  Bed Mobility Overal bed mobility: Needs Assistance Bed Mobility: Supine to Sit     Supine to sit: Supervision     General bed mobility comments: not addressed  Transfers Overall transfer level: Modified independent Equipment used: Rolling walker (2 wheeled) Transfers: Sit to/from Stand Sit to Stand: Min guard;From elevated surface         General transfer comment: demo good technique with RW and NWB status   Ambulation/Gait Ambulation/Gait assistance: Supervision Ambulation Distance (Feet): 100 Feet Assistive device: Rolling walker (2 wheeled) Gait Pattern/deviations: Step-to pattern (NWB status ) Gait velocity: decr due to NWB status  Gait velocity interpretation: Below normal speed for age/gender General Gait Details: guarded due to NWB status but good balance noted with RW   Stairs Stairs: Yes Stairs assistance: Min guard Stair Management: No rails;One rail Right;Step to pattern;Forwards;Backwards;With walker Number of Stairs: 5 General stair comments: reviewed and  practiced multiple techniques with pt; pt more steady and comfortable with forwards technique using rail and crutch   Wheelchair Mobility    Modified Rankin (Stroke Patients Only)       Balance Overall balance assessment: Needs assistance Sitting-balance support: Feet supported;No upper extremity supported Sitting balance-Leahy Scale: Good     Standing balance support: During functional activity;Bilateral upper extremity supported Standing balance-Leahy Scale: Poor Standing balance comment: relies on bil UE support for balance due to NWB status                     Cognition Arousal/Alertness: Awake/alert Behavior During Therapy: WFL for tasks assessed/performed Overall Cognitive Status: Within Functional Limits for tasks assessed                      Exercises Other Exercises Other Exercises: reviewed HEP with pt     General Comments General comments (skin integrity, edema, etc.): reviewed car transfer technique with pt and answered home setup questions for safety       Pertinent Vitals/Pain Pain Assessment: No/denies pain (given ice packs after session)    Home Living Family/patient expects to be discharged to:: Private residence Living Arrangements: Spouse/significant other Available Help at Discharge: Family;Available 24 hours/day Type of Home: House Home Access: Stairs to enter Entrance Stairs-Rails: Right;Left Home Layout: One level Home Equipment: Environmental consultant - standard;Crutches      Prior Function Level of Independence: Independent          PT Goals (current goals can now be found in the care plan section) Acute Rehab PT Goals Patient Stated Goal: to practice the steps today PT Goal Formulation: With patient Time For Goal Achievement: 12/06/13  Potential to Achieve Goals: Good Progress towards PT goals: Progressing toward goals    Frequency  Min 5X/week    PT Plan Current plan remains appropriate    Co-evaluation              End of Session Equipment Utilized During Treatment: Gait belt;Other (comment) (vendor present to donn hinged brace) Activity Tolerance: Patient tolerated treatment well Patient left: in chair;with call bell/phone within reach     Time: 1006-1030 PT Time Calculation (min): 24 min  Charges:  [employer] Training: 23-37 mins                    G CodesGustavus Bryant, [place] 11/30/2013, 11:41 AM

## 2013-11-30 NOTE — Evaluation (Signed)
Occupational Therapy Evaluation Patient Details Name: [name] MRN: [id number] DOB: 1940/10/07 Today's Date: 11/30/2013    History of Present Illness 73 y.o. male s/p Open reduction and internal fixation of right distal femur   Clinical Impression   Pt admitted with the above diagnoses and presents with below problem list. Pt seen for acute OT evaluation and treatment. PTA pt was independent with ADLs. Currently, pt is at min guard level for LB ADLs. Pt able to maintain NWB LLE throughout OOB ambulation and transfers. Wife to provide 24 hour assist. ADL and home safety education provided. No further OT needs indicated at his time.     Follow Up Recommendations  Supervision/Assistance - 24 hour;No OT follow up    Equipment Recommendations  3 in 1 bedside comode    Recommendations for Other Services       Precautions / Restrictions Precautions Precautions: Fall Restrictions Weight Bearing Restrictions: Yes RLE Weight Bearing: Non weight bearing      Mobility Bed Mobility Overal bed mobility: Needs Assistance Bed Mobility: Supine to Sit     Supine to sit: Supervision        Transfers Overall transfer level: Needs assistance Equipment used: Rolling walker (2 wheeled) Transfers: Sit to/from Stand Sit to Stand: Min guard;From elevated surface         General transfer comment: maintains NWB LLLE    Balance Overall balance assessment: Needs assistance Sitting-balance support: No upper extremity supported;Feet supported Sitting balance-Leahy Scale: Good     Standing balance support: Bilateral upper extremity supported;During functional activity Standing balance-Leahy Scale: Poor                              ADL Overall ADL's : Needs assistance/impaired Eating/Feeding: Set up;Sitting   Grooming: Set up;Sitting   Upper Body Bathing: Set up;Sitting   Lower Body Bathing: Min guard;Sit to/from stand   Upper Body Dressing : Set up;Sitting   Lower Body Dressing: Min guard;Sit to/from stand   Toilet Transfer: Min guard;Ambulation;RW (3n1 over toilet)   Toileting- Clothing Manipulation and Hygiene: Min guard;Sit to/from stand   Tub/ Shower Transfer: Min guard;3 in 1;Ambulation;Rolling walker   Functional mobility during ADLs: Min guard;Rolling walker General ADL Comments: Pt at min guard for LB ADLs. ADL education provided. Discussed home setup safety and falls prevention.      Vision                     Perception     Praxis      Pertinent Vitals/Pain Pain Assessment: No/denies pain     Hand Dominance     Extremity/Trunk Assessment Upper Extremity Assessment Upper Extremity Assessment: Overall WFL for tasks assessed   Lower Extremity Assessment Lower Extremity Assessment: Defer to PT evaluation       Communication Communication Communication: No difficulties   Cognition Arousal/Alertness: Awake/alert Behavior During Therapy: WFL for tasks assessed/performed Overall Cognitive Status: Within Functional Limits for tasks assessed                     General Comments       Exercises       Shoulder Instructions      Home Living Family/patient expects to be discharged to:: Private residence Living Arrangements: Spouse/significant other Available Help at Discharge: Family;Available 24 hours/day Type of Home: House Home Access: Stairs to enter [employer] of Steps: 5 Entrance Stairs-Rails: Right;Left Home Layout: One  level     Bathroom Shower/Tub: Teacher, early years/pre: Standard     Home Equipment: Walker - standard;Crutches          Prior Functioning/Environment Level of Independence: Independent             OT Diagnosis: Acute pain   OT Problem List: Impaired balance (sitting and/or standing);Decreased knowledge of use of DME or AE;Decreased knowledge of precautions;Pain   OT Treatment/Interventions:      OT Goals(Current goals can be  found in the care plan section) Acute Rehab OT Goals Patient Stated Goal: Go home and be independent again.  OT Frequency:     Barriers to D/C:            Co-evaluation              End of Session Equipment Utilized During Treatment: Gait belt;Rolling walker  Activity Tolerance: Patient tolerated treatment well Patient left: in chair;with call bell/phone within reach   Time: 0828-0852 OT Time Calculation (min): 24 min Charges:  OT General Charges $OT Visit: 1 Procedure OT Evaluation $Initial OT Evaluation Tier I: 1 Procedure OT Treatments $Self Care/Home Management : 8-22 mins G-Codes:    Hortencia Pilar December 25, 2013, 8:59 AM

## 2013-11-30 NOTE — Discharge Instructions (Signed)
[facility] Discharge Instructions   General Discharge Instructions  WEIGHT BEARING STATUS: Nonweightbearing Right Leg  RANGE OF MOTION/ACTIVITY: Range of motion as tolerated Right knee and ankle. R knee ROM- AROM, PROM. Prone exercises as well. No ROM restrictions.  Quad sets, SLR, LAQ, SAQ, heel slides, stretching, prone flexion and extension, etc  Wound Care: daily dressing changes starting on 12/02/2013. See instructions below   Diet: as you were eating previously.  Can use over the counter stool softeners and bowel preparations, such as Miralax, to help with bowel movements.  Narcotics can be constipating.  Be sure to drink plenty of fluids  STOP SMOKING OR USING NICOTINE PRODUCTS!!!!  As discussed nicotine severely impairs your body's ability to heal surgical and traumatic wounds but also impairs bone healing.  Wounds and bone heal by forming microscopic blood vessels (angiogenesis) and nicotine is a vasoconstrictor (essentially, shrinks blood vessels).  Therefore, if vasoconstriction occurs to these microscopic blood vessels they essentially disappear and are unable to deliver necessary nutrients to the healing tissue.  This is one modifiable factor that you can do to dramatically increase your chances of healing your injury.    (This means no smoking, no nicotine gum, patches, etc)  DO NOT USE NONSTEROIDAL ANTI-INFLAMMATORY DRUGS (NSAID'S)  Using products such as Advil (ibuprofen), Aleve (naproxen), Motrin (ibuprofen) for additional pain control during fracture healing can delay and/or prevent the healing response.  If you would like to take over the counter (OTC) medication, Tylenol (acetaminophen) is ok.  However, some narcotic medications that are given for pain control contain acetaminophen as well. Therefore, you should not exceed more than 4000 mg of tylenol in a day if you do not have liver disease.  Also note that there are may OTC medicines, such as cold medicines  and allergy medicines that my contain tylenol as well.  If you have any questions about medications and/or interactions please ask your doctor/PA or your pharmacist.   PAIN MEDICATION USE AND EXPECTATIONS  You have likely been given narcotic medications to help control your pain.  After a traumatic event that results in an fracture (broken bone) with or without surgery, it is ok to use narcotic pain medications to help control one's pain.  We understand that everyone responds to pain differently and each individual patient will be evaluated on a regular basis for the continued need for narcotic medications. Ideally, narcotic medication use should last no more than 6-8 weeks (coinciding with fracture healing).   As a patient it is your responsibility as well to monitor narcotic medication use and report the amount and frequency you use these medications when you come to your office visit.   We would also advise that if you are using narcotic medications, you should take a dose prior to therapy to maximize you participation.  IF YOU ARE ON NARCOTIC MEDICATIONS IT IS NOT PERMISSIBLE TO OPERATE A MOTOR VEHICLE (MOTORCYCLE/CAR/TRUCK/MOPED) OR HEAVY MACHINERY DO NOT MIX NARCOTICS WITH OTHER CNS (CENTRAL NERVOUS SYSTEM) DEPRESSANTS SUCH AS ALCOHOL       ICE AND ELEVATE INJURED/OPERATIVE EXTREMITY  Using ice and elevating the injured extremity above your heart can help with swelling and pain control.  Icing in a pulsatile fashion, such as 20 minutes on and 20 minutes off, can be followed.    Do not place ice directly on skin. Make sure there is a barrier between to skin and the ice pack.    Using frozen items such as frozen peas works well  as the conform nicely to the are that needs to be iced.  USE AN ACE WRAP OR TED HOSE FOR SWELLING CONTROL  In addition to icing and elevation, Ace wraps or TED hose are used to help limit and resolve swelling.  It is recommended to use Ace wraps or TED hose until you  are informed to stop.    When using Ace Wraps start the wrapping distally (farthest away from the body) and wrap proximally (closer to the body)   Example: If you had surgery on your leg or thing and you do not have a splint on, start the ace wrap at the toes and work your way up to the thigh        If you had surgery on your upper extremity and do not have a splint on, start the ace wrap at your fingers and work your way up to the upper arm  IF YOU ARE IN A SPLINT OR CAST DO NOT [place]   If your splint gets wet for any reason please contact the office immediately. You may shower in your splint or cast as long as you keep it dry.  This can be done by wrapping in a cast cover or garbage back (or similar)  Do Not stick any thing down your splint or cast such as pencils, money, or hangers to try and scratch yourself with.  If you feel itchy take benadryl as prescribed on the bottle for itching  IF YOU ARE IN A CAM BOOT (BLACK BOOT)  You may remove boot periodically. Perform daily dressing changes as noted below.  Wash the liner of the boot regularly and wear a sock when wearing the boot. It is recommended that you sleep in the boot until told otherwise  CALL THE OFFICE WITH ANY QUESTIONS OR CONCERTS: [phone]     Discharge Pin Site Instructions  Dress pins daily with Kerlix roll starting on POD 2. Wrap the Kerlix so that it tamps the skin down around the pin-skin interface to prevent/limit motion of the skin relative to the pin.  (Pin-skin motion is the primary cause of pain and infection related to external fixator pin sites).  Remove any crust or coagulum that may obstruct drainage with a saline moistened gauze or soap and water.  After POD 3, if there is no discernable drainage on the pin site dressing, the interval for change can by increased to every other day.  You may shower with the fixator, cleaning all pin sites gently with soap and water.  If you have a surgical  wound this needs to be completely dry and without drainage before showering.  The extremity can be lifted by the fixator to facilitate wound care and transfers.  Notify the office/Doctor if you experience increasing drainage, redness, or pain from a pin site, or if you notice purulent (thick, snot-like) drainage.  Discharge Wound Care Instructions  Do NOT apply any ointments, solutions or lotions to pin sites or surgical wounds.  These prevent needed drainage and even though solutions like hydrogen peroxide kill bacteria, they also damage cells lining the pin sites that help fight infection.  Applying lotions or ointments can keep the wounds moist and can cause them to breakdown and open up as well. This can increase the risk for infection. When in doubt call the office.  Surgical incisions should be dressed daily.  If any drainage is noted, use one layer of adaptic, then gauze, Kerlix, and an ace  wrap.  Once the incision is completely dry and without drainage, it may be left open to air out.  Showering may begin 36-48 hours later.  Cleaning gently with soap and water.  Traumatic wounds should be dressed daily as well.    One layer of adaptic, gauze, Kerlix, then ace wrap.  The adaptic can be discontinued once the draining has ceased    If you have a wet to dry dressing: wet the gauze with saline the squeeze as much saline out so the gauze is moist (not soaking wet), place moistened gauze over wound, then place a dry gauze over the moist one, followed by Kerlix wrap, then ace wrap.

## 2013-11-30 NOTE — Care Management Note (Signed)
CARE MANAGEMENT NOTE 11/30/2013  Patient:  DEMETRUS, PAVAO   Account Number:  [id number]  Date Initiated:  11/30/2013  Documentation initiated by:  Ricki Miller  Subjective/Objective Assessment:   34 yr yr old male admitted with right distal femur fracture, underwent  a right femur ORIF.     Action/Plan:   Case manager spoke with patient concerning home health and DME needs. Choice was offered. Referral given to [place] Lia[facility]. Patient has family support at discharge.   Anticipated DC Date:  12/01/2013   Anticipated DC Plan:  [facility]  CM consult      [place]   Choice offered to / List presented to:  C-1 Patient   DME arranged  [place]  3-N-1      DME agency  [place] arranged  [place].   Status of service:  Completed, signed off Medicare Important Message given?  NA - LOS <3 / Initial given by admissions (If response is "NO", the following Medicare IM given date fields will be blank) Date Medicare IM given:   Medicare IM given by:   Date Additional Medicare IM given:   Additional Medicare IM given by:    Discharge Disposition:  [place]  Per UR Regulation:  Reviewed for med. necessity/level of care/duration of stay

## 2013-11-30 NOTE — Progress Notes (Signed)
Patient education done on Lovenox - demonstrates self-injection without difficulty.

## 2013-11-30 NOTE — Discharge Summary (Signed)
Orthopaedic Trauma Service (OTS)  Patient ID: [name] MRN: [id number] DOB/AGE: [birth year] 72 y.o.  Admit date: 11/29/2013 Discharge date: 11/30/2013  Admission Diagnoses: Right distal femur fracture  Hyperlipidemia OSA CAD BPH  Discharge Diagnoses:  Principal Problem:   Fracture of distal femur Active Problems:   Hyperlipidemia   Obstructive sleep apnea   BPH (benign prostatic hypertrophy)   CAD (coronary artery disease)   Procedures Performed: 11/29/2013- Dr. Marcelino Scot   1.Open reduction and internal fixation of right distal femur with intercondylar extension   Discharged Condition: good  Hospital Course:   Very pleasant 73 year old white male sustained a right distal femur fracture approximately one week ago. Patient was seen in the outpatient setting. X-rays and CT scan were reviewed and it was deemed that his fracture pattern with surgical was involved his joint and was unstable. Patient was taken to the operating room on 11/30/2011 with the procedure described above was performed. After surgery patient was transferred to the PACU for recovery from anesthesia and was transferred to the orthopedic floor for observation, pain control begin therapies. Patient was admitted overnight for observation. He was doing very well on postoperative day #1 and passed all physical therapies. He was deemed to be stable for discharge to home on postoperative day #1. No acute issues were noted during his overnight stay.    Consults: None  Significant Diagnostic Studies: labs:   Results for Russell, [name] (MRN [id number]) as of 11/30/2013 08:49  Ref. Range 11/30/2013 05:36  Sodium Latest Range: 136-145 mEq/L 140  Potassium Latest Range: 3.5-5.1 mEq/L 4.5  Chloride Latest Range: 96-112 mEq/L 104  CO2 Latest Range: 19-32 mEq/L 23  BUN Latest Range: 7.0-26.0 mg/dL 14  Creatinine Latest Range: 0.50-1.35 mg/dL 1.01  Calcium Latest Range: 8.4-10.5 mg/dL 8.7  GFR calc non Af  Amer Latest Range: >90 mL/min 72 (L)  GFR calc Af Amer Latest Range: >90 mL/min 84 (L)  Glucose Latest Range: 70-99 mg/dl 144 (H)  Anion gap Latest Range: 5-15  13  WBC Latest Range: 4.0-10.5 K/uL 7.3  RBC Latest Range: 4.22-5.81 MIL/uL 3.56 (L)  Hemoglobin Latest Range: 13.0-17.0 g/dL 11.0 (L)  HCT Latest Range: 39.0-52.0 % 30.8 (L)  MCV Latest Range: 78.0-100.0 fL 86.5  MCH Latest Range: 26.0-34.0 pg 30.9  MCHC Latest Range: 30.0-36.0 g/dL 35.7  RDW Latest Range: 11.5-15.5 % 13.8  Platelets Latest Range: 150-400 K/uL 149 (L)    Treatments: IV hydration, antibiotics: Ancef, analgesia: Morphine, Norco and oxycodone, anticoagulation: LMW heparin, therapies: PT, OT and RN and surgery: as above   Discharge Exam:  Orthopaedic Trauma Service Progress Note  Subjective  Doing fantastic Ready to go home today States that he is not having any right knee pain at current time, a little sore though Tolerating diet No issues voiding No other complaints   Review of Systems  Constitutional: Negative for fever and chills.  Respiratory: Negative for shortness of breath and wheezing.   Cardiovascular: Negative for chest pain and palpitations.  Gastrointestinal: Negative for nausea, vomiting and abdominal pain.  Genitourinary: Negative for dysuria.  Neurological: Negative for tingling and sensory change.     Objective   BP 122/53  Pulse 88  Temp(Src) 98.3 F (36.8 C) (Oral)  Resp 16  Wt 84.1 kg (185 lb 6.5 oz)  SpO2 94%  Intake/Output     10/22 0701 - 10/23 0700 10/23 0701 - 10/24 0700    P.O.  360    I.V. (mL/kg) 2153.3 (  25.6)     IV Piggyback 100     Total Intake(mL/kg) 2253.3 (26.8) 360 (4.3)    Urine (mL/kg/hr) 1575     Blood 60     Total Output 1635      Net +618.3 +360            Labs Results for Russell, [name] (MRN [id number]) as of 11/30/2013 08:49   Ref. Range  11/30/2013 05:36   Sodium  Latest Range: 136-145 mEq/L  140   Potassium  Latest Range: 3.5-5.1  mEq/L  4.5   Chloride  Latest Range: 96-112 mEq/L  104   CO2  Latest Range: 19-32 mEq/L  23   BUN  Latest Range: 7.0-26.0 mg/dL  14   Creatinine  Latest Range: 0.50-1.35 mg/dL  1.01   Calcium  Latest Range: 8.4-10.5 mg/dL  8.7   GFR calc non Af Amer  Latest Range: >90 mL/min  72 (L)   GFR calc Af Amer  Latest Range: >90 mL/min  84 (L)   Glucose  Latest Range: 70-99 mg/dl  144 (H)   Anion gap  Latest Range: 5-15   13   WBC  Latest Range: 4.0-10.5 K/uL  7.3   RBC  Latest Range: 4.22-5.81 MIL/uL  3.56 (L)   Hemoglobin  Latest Range: 13.0-17.0 g/dL  11.0 (L)   HCT  Latest Range: 39.0-52.0 %  30.8 (L)   MCV  Latest Range: 78.0-100.0 fL  86.5   MCH  Latest Range: 26.0-34.0 pg  30.9   MCHC  Latest Range: 30.0-36.0 g/dL  35.7   RDW  Latest Range: 11.5-15.5 %  13.8   Platelets  Latest Range: 150-400 K/uL  149 (L)     Exam  Gen: awake and alert, NAD, sitting up in bed Lungs: clear B   Cardiac: RRR, s1 and s2 Abd: +BS, NTND Ext:        Right Lower Extremity               Dressing c/d/i             Ext warm             + DP pulse             EHL, FHL, AT, PT, peroneals, gastroc motor intact             DPN, SPN, TN sensations at baseline             No significant swelling   Assessment and Plan   POD/HD#: 1   73 y/o male s/p ORIF R distal femur fracture   1. R distal femur fracture s/p ORIF             NWB x 6 weeks             Unrestricted ROM R knee             Will get hinge brace, can do motion without brace on             Ice and elevate             Do not rest with pillow under bend of knee             PT/OT evals              Dressing change on 12/02/2013             Ok to shower when wound is dry   2.  General medical issues               Resume home meds   3. Pain management:             norco             Oxy IR             robaxin  4. ABL anemia/Hemodynamics             Stable   5. DVT/PE prophylaxis:             Lovenox x 21 days  6. ID:                Completed periop abx  7. Activity:             As tolerated while maintaining WB restrictions  8. FEN/Foley/Lines:             Advance diet             Dc lines   9. Dispo:             Dc home after therapies               Order hinge brace               Follow up in 10-14 days   Jari Pigg, PA-C [facility] [phone] (O) 11/30/2013 8:48 AM      Disposition: 01-Home or Self Care  Discharge Instructions   Call MD / Call 911    Complete by:  As directed   If you experience chest pain or shortness of breath, CALL 911 and be transported to the hospital emergency room.  If you develope a fever above 101 F, pus (white drainage) or increased drainage or redness at the wound, or calf pain, call your surgeon's office.     Constipation Prevention    Complete by:  As directed   Drink plenty of fluids.  Prune juice may be helpful.  You may use a stool softener, such as Colace (over the counter) 100 mg twice a day.  Use MiraLax (over the counter) for constipation as needed.     Diet - low sodium heart healthy    Complete by:  As directed      Discharge instructions    Complete by:  As directed   Orthopaedic Trauma Service Discharge Instructions   General Discharge Instructions  WEIGHT BEARING STATUS: Nonweightbearing Right Leg  RANGE OF MOTION/ACTIVITY: Range of motion as tolerated Right knee and ankle. R knee ROM- AROM, PROM. Prone exercises as well. No ROM restrictions.  Quad sets, SLR, LAQ, SAQ, heel slides, stretching, prone flexion and extension, etc  Wound Care: daily dressing changes starting on 12/02/2013. See instructions below   Diet: as you were eating previously.  Can use over the counter stool softeners and bowel preparations, such as Miralax, to help with bowel movements.  Narcotics can be constipating.  Be sure to drink plenty of fluids  STOP SMOKING OR USING NICOTINE PRODUCTS!!!!  As discussed nicotine severely impairs your  body's ability to heal surgical and traumatic wounds but also impairs bone healing.  Wounds and bone heal by forming microscopic blood vessels (angiogenesis) and nicotine is a vasoconstrictor (essentially, shrinks blood vessels).  Therefore, if vasoconstriction occurs to these microscopic blood vessels they essentially disappear and are unable to deliver necessary nutrients to the healing tissue.  This is one modifiable factor that  you can do to dramatically increase your chances of healing your injury.    (This means no smoking, no nicotine gum, patches, etc)  DO NOT USE NONSTEROIDAL ANTI-INFLAMMATORY DRUGS (NSAID'S)  Using products such as Advil (ibuprofen), Aleve (naproxen), Motrin (ibuprofen) for additional pain control during fracture healing can delay and/or prevent the healing response.  If you would like to take over the counter (OTC) medication, Tylenol (acetaminophen) is ok.  However, some narcotic medications that are given for pain control contain acetaminophen as well. Therefore, you should not exceed more than 4000 mg of tylenol in a day if you do not have liver disease.  Also note that there are may OTC medicines, such as cold medicines and allergy medicines that my contain tylenol as well.  If you have any questions about medications and/or interactions please ask your doctor/PA or your pharmacist.   PAIN MEDICATION USE AND EXPECTATIONS  You have likely been given narcotic medications to help control your pain.  After a traumatic event that results in an fracture (broken bone) with or without surgery, it is ok to use narcotic pain medications to help control one's pain.  We understand that everyone responds to pain differently and each individual patient will be evaluated on a regular basis for the continued need for narcotic medications. Ideally, narcotic medication use should last no more than 6-8 weeks (coinciding with fracture healing).   As a patient it is your responsibility as well to  monitor narcotic medication use and report the amount and frequency you use these medications when you come to your office visit.   We would also advise that if you are using narcotic medications, you should take a dose prior to therapy to maximize you participation.  IF YOU ARE ON NARCOTIC MEDICATIONS IT IS NOT PERMISSIBLE TO OPERATE A MOTOR VEHICLE (MOTORCYCLE/CAR/TRUCK/MOPED) OR HEAVY MACHINERY DO NOT MIX NARCOTICS WITH OTHER CNS (CENTRAL NERVOUS SYSTEM) DEPRESSANTS SUCH AS ALCOHOL       ICE AND ELEVATE INJURED/OPERATIVE EXTREMITY  Using ice and elevating the injured extremity above your heart can help with swelling and pain control.  Icing in a pulsatile fashion, such as 20 minutes on and 20 minutes off, can be followed.    Do not place ice directly on skin. Make sure there is a barrier between to skin and the ice pack.    Using frozen items such as frozen peas works well as the conform nicely to the are that needs to be iced.  USE AN ACE WRAP OR TED HOSE FOR SWELLING CONTROL  In addition to icing and elevation, Ace wraps or TED hose are used to help limit and resolve swelling.  It is recommended to use Ace wraps or TED hose until you are informed to stop.    When using Ace Wraps start the wrapping distally (farthest away from the body) and wrap proximally (closer to the body)   Example: If you had surgery on your leg or thing and you do not have a splint on, start the ace wrap at the toes and work your way up to the thigh        If you had surgery on your upper extremity and do not have a splint on, start the ace wrap at your fingers and work your way up to the upper arm  IF YOU ARE IN A SPLINT OR CAST DO NOT [place]   If your splint gets wet for any reason please contact the office  immediately. You may shower in your splint or cast as long as you keep it dry.  This can be done by wrapping in a cast cover or garbage back (or similar)  Do Not stick any thing down your  splint or cast such as pencils, money, or hangers to try and scratch yourself with.  If you feel itchy take benadryl as prescribed on the bottle for itching  IF YOU ARE IN A CAM BOOT (BLACK BOOT)  You may remove boot periodically. Perform daily dressing changes as noted below.  Wash the liner of the boot regularly and wear a sock when wearing the boot. It is recommended that you sleep in the boot until told otherwise  CALL THE OFFICE WITH ANY QUESTIONS OR CONCERTS: [phone]     Discharge Pin Site Instructions  Dress pins daily with Kerlix roll starting on POD 2. Wrap the Kerlix so that it tamps the skin down around the pin-skin interface to prevent/limit motion of the skin relative to the pin.  (Pin-skin motion is the primary cause of pain and infection related to external fixator pin sites).  Remove any crust or coagulum that may obstruct drainage with a saline moistened gauze or soap and water.  After POD 3, if there is no discernable drainage on the pin site dressing, the interval for change can by increased to every other day.  You may shower with the fixator, cleaning all pin sites gently with soap and water.  If you have a surgical wound this needs to be completely dry and without drainage before showering.  The extremity can be lifted by the fixator to facilitate wound care and transfers.  Notify the office/Doctor if you experience increasing drainage, redness, or pain from a pin site, or if you notice purulent (thick, snot-like) drainage.  Discharge Wound Care Instructions  Do NOT apply any ointments, solutions or lotions to pin sites or surgical wounds.  These prevent needed drainage and even though solutions like hydrogen peroxide kill bacteria, they also damage cells lining the pin sites that help fight infection.  Applying lotions or ointments can keep the wounds moist and can cause them to breakdown and open up as well. This can increase the risk for infection. When in doubt  call the office.  Surgical incisions should be dressed daily.  If any drainage is noted, use one layer of adaptic, then gauze, Kerlix, and an ace wrap.  Once the incision is completely dry and without drainage, it may be left open to air out.  Showering may begin 36-48 hours later.  Cleaning gently with soap and water.  Traumatic wounds should be dressed daily as well.    One layer of adaptic, gauze, Kerlix, then ace wrap.  The adaptic can be discontinued once the draining has ceased    If you have a wet to dry dressing: wet the gauze with saline the squeeze as much saline out so the gauze is moist (not soaking wet), place moistened gauze over wound, then place a dry gauze over the moist one, followed by Kerlix wrap, then ace wrap.     Do not put a pillow under the knee. Place it under the heel.    Complete by:  As directed      Driving restrictions    Complete by:  As directed   No driving     Increase activity slowly as tolerated    Complete by:  As directed      Non weight bearing  Complete by:  As directed   Laterality:  right  Extremity:  Lower            Medication List    STOP taking these medications       ibuprofen 200 MG tablet  Commonly known as:  ADVIL,MOTRIN     oxyCODONE-acetaminophen 5-325 MG per tablet  Commonly known as:  PERCOCET/ROXICET      TAKE these medications       amphetamine-dextroamphetamine 10 MG tablet  Commonly known as:  ADDERALL  Take 30 mg by mouth every morning.     b complex vitamins tablet  Take 1 tablet by mouth every evening.     docusate sodium 100 MG capsule  Commonly known as:  COLACE  Take 1 capsule (100 mg total) by mouth 2 (two) times daily.     enoxaparin 40 MG/0.4ML injection  Commonly known as:  LOVENOX  Inject 0.4 mLs (40 mg total) into the skin daily.     ferrous sulfate 325 (65 FE) MG tablet  Take 325 mg by mouth 2 (two) times daily.     HYDROcodone-acetaminophen 7.5-325 MG per tablet  Commonly known as:   NORCO  Take 1-2 tablets by mouth every 6 (six) hours as needed for moderate pain or severe pain.     methocarbamol 500 MG tablet  Commonly known as:  ROBAXIN  Take 1-2 tablets (500-1,000 mg total) by mouth every 6 (six) hours as needed for muscle spasms.     multivitamin with minerals Tabs tablet  Take 1 tablet by mouth every evening.     omeprazole 10 MG capsule  Commonly known as:  PRILOSEC  Take 1 capsule (10 mg total) by mouth 2 (two) times daily.     oxyCODONE 5 MG immediate release tablet  Commonly known as:  Oxy IR/ROXICODONE  Take 1-2 tablets (5-10 mg total) by mouth every 6 (six) hours as needed for breakthrough pain (take between hydrocodone if needed for breakthrough pain).     simvastatin 20 MG tablet  Commonly known as:  ZOCOR  Take 1 tablet (20 mg total) by mouth at bedtime.           Follow-up Information   Follow up with HANDY,MICHAEL H, MD. Schedule an appointment as soon as possible for a visit in 14 days. (For suture removal, For wound re-check)    Specialty:  Orthopedic Surgery   Contact information:   [address] [place] [address] [phone]       Discharge Instructions and Plan:  Mr. Sebesta has sustained a severe injury to his right distal femur. We were able to achieve excellent fixation via plate osteosynthesis.   Patient will be nonweightbearing for the next 6 weeks with the use of crutches or a walker  He has unrestricted range of motion of his right knee.   Will remain on Lovenox for 21 days postoperatively for DVT and PE prophylaxis   Patient is encouraged to be as active as possible while maintaining his weight bearing restrictions  I reviewed wound care instructions have been reviewed with the patient and he can begin wound care on 12/02/2013. Once his operative wound is completely dry it is okay to shower. His 2 options with only with soap and water. No ointments or lotions to be applied.  The patient follow up  with orthopedics in 10-14 days.  He'll contact us in the interim with any questions or concerns  Signed:  Jari Pigg, PA-C  [facility] [phone] (P) 11/30/2013, 9:02 AM

## 2013-11-30 NOTE — Progress Notes (Signed)
Orthopaedic Trauma Service Progress Note  Subjective  Doing fantastic Ready to go home today States that he is not having any right knee pain at current time, a little sore though Tolerating diet No issues voiding No other complaints   Review of Systems  Constitutional: Negative for fever and chills.  Respiratory: Negative for shortness of breath and wheezing.   Cardiovascular: Negative for chest pain and palpitations.  Gastrointestinal: Negative for nausea, vomiting and abdominal pain.  Genitourinary: Negative for dysuria.  Neurological: Negative for tingling and sensory change.     Objective   BP 122/53  Pulse 88  Temp(Src) 98.3 F (36.8 C) (Oral)  Resp 16  Wt 84.1 kg (185 lb 6.5 oz)  SpO2 94%  Intake/Output     10/22 0701 - 10/23 0700 10/23 0701 - 10/24 0700   P.O.  360   I.V. (mL/kg) 2153.3 (25.6)    IV Piggyback 100    Total Intake(mL/kg) 2253.3 (26.8) 360 (4.3)   Urine (mL/kg/hr) 1575    Blood 60    Total Output 1635     Net +618.3 +360          Labs Results for KENNEDY, BOHANON (MRN [id number]) as of 11/30/2013 08:49  Ref. Range 11/30/2013 05:36  Sodium Latest Range: 136-145 mEq/L 140  Potassium Latest Range: 3.5-5.1 mEq/L 4.5  Chloride Latest Range: 96-112 mEq/L 104  CO2 Latest Range: 19-32 mEq/L 23  BUN Latest Range: 7.0-26.0 mg/dL 14  Creatinine Latest Range: 0.50-1.35 mg/dL 1.01  Calcium Latest Range: 8.4-10.5 mg/dL 8.7  GFR calc non Af Amer Latest Range: >90 mL/min 72 (L)  GFR calc Af Amer Latest Range: >90 mL/min 84 (L)  Glucose Latest Range: 70-99 mg/dl 144 (H)  Anion gap Latest Range: 5-15  13  WBC Latest Range: 4.0-10.5 K/uL 7.3  RBC Latest Range: 4.22-5.81 MIL/uL 3.56 (L)  Hemoglobin Latest Range: 13.0-17.0 g/dL 11.0 (L)  HCT Latest Range: 39.0-52.0 % 30.8 (L)  MCV Latest Range: 78.0-100.0 fL 86.5  MCH Latest Range: 26.0-34.0 pg 30.9  MCHC Latest Range: 30.0-36.0 g/dL 35.7  RDW Latest Range: 11.5-15.5 % 13.8  Platelets Latest Range:  150-400 K/uL 149 (L)    Exam  Gen: awake and alert, NAD, sitting up in bed Lungs: clear B  Cardiac: RRR, s1 and s2 Abd: +BS, NTND Ext:       Right Lower Extremity   Dressing c/d/i  Ext warm  + DP pulse  EHL, FHL, AT, PT, peroneals, gastroc motor intact  DPN, SPN, TN sensations at baseline  No significant swelling   Assessment and Plan   POD/HD#: 1   73 y/o male s/p ORIF R distal femur fracture   1. R distal femur fracture s/p ORIF  NWB x 6 weeks  Unrestricted ROM R knee  Will get hinge brace, can do motion without brace on  Ice and elevate  Do not rest with pillow under bend of knee  PT/OT evals   Dressing change on 12/02/2013  Ok to shower when wound is dry   2. General medical issues   Resume home meds   3. Pain management:  norco  Oxy IR  robaxin  4. ABL anemia/Hemodynamics  Stable   5. DVT/PE prophylaxis:  Lovenox x 21 days  6. ID:   Completed periop abx  7. Activity:  As tolerated while maintaining WB restrictions  8. FEN/Foley/Lines:  Advance diet  Dc lines   9. Dispo:  Dc home after therapies   Order hinge brace  Follow up in 10-14 days   Jari Pigg, PA-C [facility] [phone] (O) 11/30/2013 8:48 AM

## 2013-11-30 NOTE — Progress Notes (Signed)
Orthopedic Tech Progress Note Patient Details:  [name] August 05, 1940 [id number]  Patient ID: [name], male   DOB: [birth year], 73 y.o.   MRN: [id number] Called in Advanced brace order; spoke with Alinda Sierras, [name] 11/30/2013, 9:33 AM

## 2013-12-03 NOTE — Progress Notes (Signed)
[facility] ED CM received a call from [place], [place] about pt Referred her to [place] office [phone]

## 2013-12-12 ENCOUNTER — Ambulatory Visit (HOSPITAL_BASED_OUTPATIENT_CLINIC_OR_DEPARTMENT_OTHER): Payer: Medicare Other | Admitting: Nurse Practitioner

## 2013-12-12 ENCOUNTER — Telehealth: Payer: Self-pay

## 2013-12-12 ENCOUNTER — Telehealth: Payer: Self-pay | Admitting: Nurse Practitioner

## 2013-12-12 ENCOUNTER — Other Ambulatory Visit (HOSPITAL_BASED_OUTPATIENT_CLINIC_OR_DEPARTMENT_OTHER): Payer: Medicare Other

## 2013-12-12 VITALS — BP 151/69 | HR 71 | Temp 97.8°F | Resp 18 | Ht 73.0 in | Wt 178.5 lb

## 2013-12-12 DIAGNOSIS — C180 Malignant neoplasm of cecum: Secondary | ICD-10-CM

## 2013-12-12 DIAGNOSIS — C182 Malignant neoplasm of ascending colon: Secondary | ICD-10-CM

## 2013-12-12 DIAGNOSIS — D702 Other drug-induced agranulocytosis: Secondary | ICD-10-CM

## 2013-12-12 DIAGNOSIS — D6959 Other secondary thrombocytopenia: Secondary | ICD-10-CM

## 2013-12-12 DIAGNOSIS — G622 Polyneuropathy due to other toxic agents: Secondary | ICD-10-CM

## 2013-12-12 LAB — CBC WITH DIFFERENTIAL/PLATELET
BASO%: 0.9 % (ref 0.0–2.0)
Basophils Absolute: 0 10*3/uL (ref 0.0–0.1)
EOS%: 2.6 % (ref 0.0–7.0)
Eosinophils Absolute: 0.1 10*3/uL (ref 0.0–0.5)
HEMATOCRIT: 43.1 % (ref 38.4–49.9)
HGB: 14.4 g/dL (ref 13.0–17.1)
LYMPH#: 1.6 10*3/uL (ref 0.9–3.3)
LYMPH%: 28.1 % (ref 14.0–49.0)
MCH: 29.8 pg (ref 27.2–33.4)
MCHC: 33.5 g/dL (ref 32.0–36.0)
MCV: 89.1 fL (ref 79.3–98.0)
MONO#: 0.4 10*3/uL (ref 0.1–0.9)
MONO%: 7.8 % (ref 0.0–14.0)
NEUT#: 3.5 10*3/uL (ref 1.5–6.5)
NEUT%: 60.6 % (ref 39.0–75.0)
Platelets: 216 10*3/uL (ref 140–400)
RBC: 4.84 10*6/uL (ref 4.20–5.82)
RDW: 14 % (ref 11.0–14.6)
WBC: 5.7 10*3/uL (ref 4.0–10.3)

## 2013-12-12 LAB — CEA: CEA: 0.6 ng/mL (ref 0.0–5.0)

## 2013-12-12 NOTE — Telephone Encounter (Signed)
Pt concerned with Colonoscopy scheduling recommended by genetics counselor, faxed over genetics counselor notes and pts concerns to [place] office.

## 2013-12-12 NOTE — Telephone Encounter (Signed)
Gave avs & cal for May 2016. Also gave pt contrast for CT Scan.

## 2013-12-12 NOTE — Progress Notes (Signed)
[place] OFFICE PROGRESS NOTE   Diagnosis:  Colon cancer  INTERVAL HISTORY:   [name] returns as scheduled. No change in bowel habits. No bloody or black stools. He denies nausea/vomiting. No abdominal pain. He reports a good appetite.  He fractured the distal right femur in October and underwent surgery.  Objective:  Vital signs in last 24 hours:  Blood pressure 151/69, pulse 71, temperature 97.8 F (36.6 C), temperature source Oral, resp. rate 18, height _0  (1.854 m), weight 178 lb 8 oz (80.967 kg).    HEENT: no thrush or ulcers. Lymphatics: no palpable cervical, supraclavicular, axillary or inguinal lymph nodes. Resp: lungs clear bilaterally. Cardio: regular rate and rhythm. GI: abdomen soft and nontender. No hepatomegaly. Vascular: no left leg edema. He is wearing a brace on the right leg.     Lab Results:  Lab Results  Component Value Date   WBC 5.7 12/12/2013   HGB 14.4 12/12/2013   HCT 43.1 12/12/2013   MCV 89.1 12/12/2013   PLT 216 12/12/2013   NEUTROABS 3.5 12/12/2013  CEA pending.  Imaging:  No results found.  Medications: I have reviewed the patient's current medications.  Assessment/Plan: 1.Moderately differentiated adenocarcinoma of the cecum, stage IIIB (T4 N1), status post a right colectomy on 04/08/2011 -the tumor was microsatellite stable and K-ras wild-type . He began adjuvant CAPOX chemotherapy on 05/03/2011. He completed cycle 5 on 07/26/2011 (oxaliplatin was held from cycle 4). He completed cycle 7 beginning 09/06/2011. He completed cycle 8 beginning 09/27/2011.   CTs of the chest, abdomen, and pelvis 05/15/2013-negative for recurrent colon cancer 2. History of colon polyps. He underwent a surveillance colonoscopy by Dr. Deatra Ina 03/07/2013  3. History of anemia secondary to iron deficiency and chemotherapy. The hemoglobin has corrected with iron therapy  4. Postoperative abdominal wall/flank hematoma.  5. Sleep  apnea.  6. History of atrial fibrillation.  7. History of esophageal stricture.  8. Family history of colon cancer and gastric cancer. The tumor was microsatellite stable and no mutation was found in the mismatch repair genes. A CancerNext panel was negative.  9. 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  11. 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 Abdominal wall hernia noted on a CT of the abdomen 12/03/2011.  13. Splenomegaly-noted on CT scans back to January of 2013.  14. Stool positive for occult blood 02/12/2013. Ferritin 38 02/14/2013. Colonoscopy and upper endoscopy 03/07/2013 with no source for GI blood loss identified. Capsule endoscopy 03/20/2013 negative. 15. Colonoscopy on 03/07/2013. At the anastomosis along the suture line there were several areas of prominent mucosa. Biopsies were taken. Moderate diverticulosis was noted in the sigmoid colon. Internal hemorrhoids also noted. No definite source for GI blood loss was identified. Biopsy of the anastomosis site showed benign colonic mucosa with inflamed granulation tissue. No adenomatous change or malignancy. He subsequently underwent an upper endoscopy (03/07/2013) which was normal.  16. Review of peripheral blood smear 01/30/2013-few metamyelocytes and myelocytes, mild decrease in platelets, normal red blood cells.  51. Right distal femur fracture October 2015 status post surgery.   Disposition: [name] remains in clinical remission from colon cancer. We will followup on the CEA from today. He will return for a followup visit in 6 months with labs and CT scans 1 week prior to that visit. He will contact the office in the interim with any problems.  Plan reviewed with Dr. Benay [name].  [name] ANP/GNP-BC   12/12/2013  9:52 AM

## 2013-12-24 ENCOUNTER — Telehealth: Payer: Self-pay | Admitting: Gastroenterology

## 2013-12-24 NOTE — Telephone Encounter (Signed)
Patient had questions about recommendations for yearly colonoscopy and upper endoscopy in view of strong family history of colorectal cancer and possibility of a Lynch-like syndrome.  This was recommended by the genetic counselor.  Patient is comfortable with the recommendations and will follow them.

## 2013-12-24 NOTE — Telephone Encounter (Signed)
Existing recall edited

## 2014-02-12 ENCOUNTER — Encounter: Payer: Self-pay | Admitting: Family Medicine

## 2014-02-12 ENCOUNTER — Telehealth: Payer: Self-pay | Admitting: Family Medicine

## 2014-02-12 ENCOUNTER — Ambulatory Visit (INDEPENDENT_AMBULATORY_CARE_PROVIDER_SITE_OTHER): Payer: Medicare Other | Admitting: Family Medicine

## 2014-02-12 VITALS — BP 130/80 | Temp 97.4°F | Wt 172.0 lb

## 2014-02-12 DIAGNOSIS — Z23 Encounter for immunization: Secondary | ICD-10-CM

## 2014-02-12 DIAGNOSIS — G609 Hereditary and idiopathic neuropathy, unspecified: Secondary | ICD-10-CM

## 2014-02-12 HISTORY — DX: Hereditary and idiopathic neuropathy, unspecified: G60.9

## 2014-02-12 MED ORDER — AMITRIPTYLINE HCL 10 MG PO TABS: 10.0000 mg | ORAL_TABLET | Freq: Every day | ORAL | Status: DC

## 2014-02-12 NOTE — Progress Notes (Signed)
   Subjective:    Patient ID: [name], male    DOB: May 14, 1940, 74 y.o.   MRN: [id number]  HPI [name] is a delightful 74 year old married male nonsmoker who comes in today for evaluation of peripheral neuropathy and of his right foot  He had colon cancer surgery and subsequent chemotherapy a couple years ago which triggered the neuropathy. It seemed to be very indolent until October when he fell and broke his right femur. He had a plate put in by Dr. Marcelino Scot has done well for rheumatic however the fracture triggered the exacerbation of his neuropathy. He's never taken any medication for the neuropathy.  He says during the day doesn't bother him that much but at night he has a burning sensation. Since the fracture has healed the neuropathy is has seemed to improved however still bothersome and he would like to discuss any treatment options.   Review of Systems Review of systems otherwise negative    Objective:   Physical Exam  Well-developed well-nourished male no acute distress vital signs stable he is afebrile examination a freight shows marked erythema of the right foot. Pulses 1+ dorsalis pedis and posterior tibial. Skin appears normal. Sensation normal.      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

## 2014-02-12 NOTE — Telephone Encounter (Signed)
Can you please change the quantity sent on amitriptyline from 100 per 30 to 30 per 30 days.  [facility] on [address].  Also, when I submitted the PA the below message appeared: Amitriptyline is not recommended for those 65 years and older. Please consider changing to a safer alternative for your patient's diagnosis. Safer alternatives for depression include TRAZODONE; an SSRI such as SERTRALINE, FLUOXETINE, or ESCITALOPRAM; an SNRI such as VENLAFAXINE or DULOXETINE; or BUPROPION. Safer alternatives for nerve pain include DULOXETINE, LYRICA, GABAPENTIN, or LIDOCAINE patch (prior authorization may apply). Safer alternatives for difficulty sleeping include SILENOR (doxepine) at a dose no more than 6 mg per day.   I will let you know the outcome of the PA request.

## 2014-02-12 NOTE — Progress Notes (Signed)
Pre visit review using our clinic review tool, if applicable. No additional management support is needed unless otherwise documented below in the visit note. 

## 2014-02-12 NOTE — Patient Instructions (Signed)
Elavil 10 mg........... one tablet daily at bedtime  Return in one month for follow-up

## 2014-02-13 MED ORDER — AMITRIPTYLINE HCL 10 MG PO TABS: 10.0000 mg | ORAL_TABLET | Freq: Every day | ORAL | Status: DC

## 2014-03-13 ENCOUNTER — Ambulatory Visit: Payer: Medicare Other | Admitting: Family Medicine

## 2014-04-02 ENCOUNTER — Encounter: Payer: Self-pay | Admitting: Gastroenterology

## 2014-04-10 ENCOUNTER — Other Ambulatory Visit (INDEPENDENT_AMBULATORY_CARE_PROVIDER_SITE_OTHER): Payer: Medicare Other

## 2014-04-10 DIAGNOSIS — Z0000 Encounter for general adult medical examination without abnormal findings: Secondary | ICD-10-CM | POA: Diagnosis not present

## 2014-04-10 DIAGNOSIS — E785 Hyperlipidemia, unspecified: Secondary | ICD-10-CM

## 2014-04-10 LAB — BASIC METABOLIC PANEL
BUN: 12 mg/dL (ref 6–23)
CALCIUM: 9.7 mg/dL (ref 8.4–10.5)
CO2: 29 mEq/L (ref 19–32)
Chloride: 106 mEq/L (ref 96–112)
Creatinine, Ser: 0.99 mg/dL (ref 0.40–1.50)
GFR: 78.88 mL/min (ref >60.00)
GLUCOSE: 102 mg/dL — AB (ref 70–99)
Potassium: 4.5 mEq/L (ref 3.5–5.1)
Sodium: 141 mEq/L (ref 135–145)

## 2014-04-10 LAB — LIPID PANEL
CHOLESTEROL: 127 mg/dL (ref 0–200)
HDL: 46.2 mg/dL (ref >39.00)
LDL CALC: 67 mg/dL (ref 0–99)
NonHDL: 80.8
TRIGLYCERIDES: 71 mg/dL (ref 0.0–149.0)
Total CHOL/HDL Ratio: 3
VLDL: 14.2 mg/dL (ref 0.0–40.0)

## 2014-04-10 LAB — CBC WITH DIFFERENTIAL/PLATELET
BASOS PCT: 0.5 % (ref 0.0–3.0)
Basophils Absolute: 0 10*3/uL (ref 0.0–0.1)
Eosinophils Absolute: 0.1 10*3/uL (ref 0.0–0.7)
Eosinophils Relative: 3.1 % (ref 0.0–5.0)
HCT: 39.1 % (ref 39.0–52.0)
Hemoglobin: 13.7 g/dL (ref 13.0–17.0)
LYMPHS PCT: 28.9 % (ref 12.0–46.0)
Lymphs Abs: 0.8 10*3/uL (ref 0.7–4.0)
MCHC: 35 g/dL (ref 30.0–36.0)
MCV: 84.5 fl (ref 78.0–100.0)
MONO ABS: 0.2 10*3/uL (ref 0.1–1.0)
Monocytes Relative: 7.7 % (ref 3.0–12.0)
NEUTROS ABS: 1.7 10*3/uL (ref 1.4–7.7)
Neutrophils Relative %: 59.8 % (ref 43.0–77.0)
Platelets: 105 10*3/uL — ABNORMAL LOW (ref 150.0–400.0)
RBC: 4.62 Mil/uL (ref 4.22–5.81)
RDW: 15.5 % (ref 11.5–15.5)
WBC: 2.8 10*3/uL — AB (ref 4.0–10.5)

## 2014-04-10 LAB — POCT URINALYSIS DIPSTICK
Bilirubin, UA: NEGATIVE
Blood, UA: NEGATIVE
Glucose, UA: NEGATIVE
LEUKOCYTES UA: NEGATIVE
Nitrite, UA: NEGATIVE
PH UA: 5.5
PROTEIN UA: NEGATIVE
Spec Grav, UA: 1.01
Urobilinogen, UA: 0.2

## 2014-04-10 LAB — HEPATIC FUNCTION PANEL
ALT: 20 U/L (ref 0–53)
AST: 18 U/L (ref 0–37)
Albumin: 4.3 g/dL (ref 3.5–5.2)
Alkaline Phosphatase: 85 U/L (ref 39–117)
Bilirubin, Direct: 0.4 mg/dL — ABNORMAL HIGH (ref 0.0–0.3)
Total Bilirubin: 1.7 mg/dL — ABNORMAL HIGH (ref 0.2–1.2)
Total Protein: 7.1 g/dL (ref 6.0–8.3)

## 2014-04-10 LAB — TSH: TSH: 1.54 u[IU]/mL (ref 0.35–4.50)

## 2014-04-10 LAB — PSA: PSA: 2.87 ng/mL (ref 0.10–4.00)

## 2014-04-13 ENCOUNTER — Encounter: Payer: Self-pay | Admitting: Gastroenterology

## 2014-04-16 ENCOUNTER — Encounter: Payer: Self-pay | Admitting: Family Medicine

## 2014-04-16 ENCOUNTER — Ambulatory Visit (INDEPENDENT_AMBULATORY_CARE_PROVIDER_SITE_OTHER): Payer: Medicare Other | Admitting: Family Medicine

## 2014-04-16 VITALS — BP 120/74 | Temp 98.0°F | Ht 72.75 in | Wt 172.0 lb

## 2014-04-16 DIAGNOSIS — N528 Other male erectile dysfunction: Secondary | ICD-10-CM

## 2014-04-16 DIAGNOSIS — G609 Hereditary and idiopathic neuropathy, unspecified: Secondary | ICD-10-CM

## 2014-04-16 DIAGNOSIS — N529 Male erectile dysfunction, unspecified: Secondary | ICD-10-CM

## 2014-04-16 DIAGNOSIS — Z0000 Encounter for general adult medical examination without abnormal findings: Secondary | ICD-10-CM | POA: Diagnosis not present

## 2014-04-16 DIAGNOSIS — N400 Enlarged prostate without lower urinary tract symptoms: Secondary | ICD-10-CM | POA: Diagnosis not present

## 2014-04-16 DIAGNOSIS — E785 Hyperlipidemia, unspecified: Secondary | ICD-10-CM | POA: Diagnosis not present

## 2014-04-16 DIAGNOSIS — K2100 Gastro-esophageal reflux disease with esophagitis, without bleeding: Secondary | ICD-10-CM

## 2014-04-16 MED ORDER — OMEPRAZOLE 10 MG PO CPDR: 10.0000 mg | DELAYED_RELEASE_CAPSULE | Freq: Two times a day (BID) | ORAL | Status: DC

## 2014-04-16 MED ORDER — GABAPENTIN 100 MG PO CAPS: ORAL_CAPSULE | ORAL | Status: DC

## 2014-04-16 MED ORDER — SIMVASTATIN 20 MG PO TABS: 20.0000 mg | ORAL_TABLET | Freq: Every day | ORAL | Status: DC

## 2014-04-16 MED ORDER — SILDENAFIL CITRATE 100 MG PO TABS: 100.0000 mg | ORAL_TABLET | Freq: Every day | ORAL | Status: DC | PRN

## 2014-04-16 NOTE — Progress Notes (Signed)
Pre visit review using our clinic review tool, if applicable. No additional management support is needed unless otherwise documented below in the visit note. 

## 2014-04-16 NOTE — Patient Instructions (Signed)
Trial of Neurontin 100 mg........ one twice daily.....Marland Kitchen if you don't see any improvement in 6-8 weeks call and leave a voicemail with Apolonio Schneiders extension [phone]..... And we will get you set up to see a neurologist  Continue other medications  Return in one year for general physical exam......... Netta Corrigan will be taking over for me after May

## 2014-04-16 NOTE — Progress Notes (Signed)
   Subjective:    Patient ID: [name], male    DOB: [birth year], 74 y.o.   MRN: [id number]  HPI [name] is a 74 year old married male nonsmoker who comes in today for evaluation of neuropathy etiology unknown, reflux esophagitis, hyperlipidemia and erectile dysfunction  He tells me he sees his psychiatrist and they have him on 30 mg of Adderall daily.  Which give him a trial of 10 mg of Elavil bedtime to help with the neuropathy it didn't help. He would like to try Neurontin.  He takes Prilosec 10 mg twice a day for chronic reflux and Zocor 20 mg daily for hyperlipidemia.  He has a history of underlying coronary artery disease currently asymptomatic, sleep apnea, history of colon cancer follow-up by GI and oncology, atrial fibrillation currently in sinus rhythm  Vaccinations up-to-date  Cognitive function normal he exercises daily home health safety reviewed no issues identified, no guns in the house, he does have a healthcare power of attorney and living well   Review of Systems  Constitutional: Negative.   HENT: Negative.   Eyes: Negative.   Respiratory: Negative.   Cardiovascular: Negative.   Gastrointestinal: Negative.   Endocrine: Negative.   Genitourinary: Negative.   Musculoskeletal: Negative.   Skin: Negative.   Allergic/Immunologic: Negative.   Neurological: Negative.   Hematological: Negative.   Psychiatric/Behavioral: Negative.        Objective:   Physical Exam  Constitutional: He is oriented to person, place, and time. He appears well-developed and well-nourished.  HENT:  Head: Normocephalic and atraumatic.  Right Ear: External ear normal.  Left Ear: External ear normal.  Nose: Nose normal.  Mouth/Throat: Oropharynx is clear and moist.  Eyes: Conjunctivae and EOM are normal. Pupils are equal, round, and reactive to light.  Neck: Normal range of motion. Neck supple. No JVD present. No tracheal deviation present. No thyromegaly present.  Cardiovascular:  Normal rate, regular rhythm, normal heart sounds and intact distal pulses.  Exam reveals no gallop and no friction rub.   No murmur heard. No carotid neurologic bruits peripheral pulses 1+ and symmetrical  Pulmonary/Chest: Effort normal and breath sounds normal. No stridor. No respiratory distress. He has no wheezes. He has no rales. He exhibits no tenderness.  Abdominal: Soft. Bowel sounds are normal. He exhibits no distension and no mass. There is no tenderness. There is no rebound and no guarding.  Genitourinary: Rectum normal and penis normal. Guaiac negative stool. No penile tenderness.  2+ symmetrical nonnodular BPH  Musculoskeletal: Normal range of motion. He exhibits no edema or tenderness.  Lymphadenopathy:    He has no cervical adenopathy.  Neurological: He is alert and oriented to person, place, and time. He has normal reflexes. No cranial nerve deficit. He exhibits normal muscle tone.  Skin: Skin is warm and dry. No rash noted. No erythema. No pallor.  Psychiatric: He has a normal mood and affect. His behavior is normal. Judgment and thought content normal.  Nursing note and vitals reviewed.         Assessment & Plan:  Neuropathy etiology unknown.... Trial of Neurontin neuro consult when necessary  Reflux esophagitis continue Prilosec 10 mg twice a day  History of hyperlipidemia on Zocor 20 mg daily with normal lipids LDL 67.  BPH,, asymptomatic relatively  Erectile dysfunction,,,,,, refill Viagra  History of colon cancer,,,,,, followed up in oncology and GI

## 2014-04-19 ENCOUNTER — Telehealth: Payer: Self-pay | Admitting: Family Medicine

## 2014-04-19 MED ORDER — OMEPRAZOLE 20 MG PO CPDR: 20.0000 mg | DELAYED_RELEASE_CAPSULE | Freq: Every day | ORAL | Status: DC

## 2014-04-19 NOTE — Telephone Encounter (Signed)
I received a Quantity Limit request for omeprazole 10 mg, 60/30, which I submitted the request to the patient's plan.  However, I looked up the formulary's and saw omeprazole capsules delayed released 20 mg and 40 mg, are available with the quantity of 30/30.  Can the rx be changed to one of the preferred strengths with the quantity 30/30?

## 2014-04-19 NOTE — Telephone Encounter (Signed)
Yes. His plan is quantity restricted and only allows 30 per 30.

## 2014-04-19 NOTE — Telephone Encounter (Signed)
Rx sent and wife is aware 

## 2014-04-19 NOTE — Telephone Encounter (Signed)
Patient take medication twice daily?

## 2014-04-25 ENCOUNTER — Other Ambulatory Visit: Payer: Self-pay | Admitting: Family Medicine

## 2014-06-12 ENCOUNTER — Ambulatory Visit (HOSPITAL_COMMUNITY)
Admission: RE | Admit: 2014-06-12 | Discharge: 2014-06-12 | Disposition: A | Discharge status: Home / Self Care | Payer: Medicare Other | Source: Ambulatory Visit | Attending: Nurse Practitioner | Admitting: Nurse Practitioner

## 2014-06-12 ENCOUNTER — Encounter (HOSPITAL_COMMUNITY): Payer: Self-pay

## 2014-06-12 ENCOUNTER — Other Ambulatory Visit (HOSPITAL_BASED_OUTPATIENT_CLINIC_OR_DEPARTMENT_OTHER): Payer: Medicare Other

## 2014-06-12 DIAGNOSIS — C182 Malignant neoplasm of ascending colon: Secondary | ICD-10-CM | POA: Insufficient documentation

## 2014-06-12 DIAGNOSIS — Z9221 Personal history of antineoplastic chemotherapy: Secondary | ICD-10-CM | POA: Insufficient documentation

## 2014-06-12 DIAGNOSIS — C180 Malignant neoplasm of cecum: Secondary | ICD-10-CM | POA: Diagnosis not present

## 2014-06-12 DIAGNOSIS — Z9889 Other specified postprocedural states: Secondary | ICD-10-CM | POA: Insufficient documentation

## 2014-06-12 LAB — COMPREHENSIVE METABOLIC PANEL (CC13)
ALK PHOS: 131 U/L (ref 40–150)
ALT: 21 U/L (ref 0–55)
AST: 15 U/L (ref 5–34)
Albumin: 4 g/dL (ref 3.5–5.0)
Anion Gap: 10 mEq/L (ref 3–11)
BUN: 11.6 mg/dL (ref 7.0–26.0)
CALCIUM: 9.4 mg/dL (ref 8.4–10.4)
CO2: 25 mEq/L (ref 22–29)
Chloride: 107 mEq/L (ref 98–109)
Creatinine: 1 mg/dL (ref 0.7–1.3)
EGFR: 73 mL/min/{1.73_m2} — AB (ref >90)
Glucose: 133 mg/dl (ref 70–140)
Potassium: 4.4 mEq/L (ref 3.5–5.1)
Sodium: 142 mEq/L (ref 136–145)
Total Bilirubin: 0.78 mg/dL (ref 0.20–1.20)
Total Protein: 7.4 g/dL (ref 6.4–8.3)

## 2014-06-12 LAB — CBC WITH DIFFERENTIAL/PLATELET
BASO%: 0.3 % (ref 0.0–2.0)
Basophils Absolute: 0 10*3/uL (ref 0.0–0.1)
EOS%: 3 % (ref 0.0–7.0)
Eosinophils Absolute: 0.1 10*3/uL (ref 0.0–0.5)
HEMATOCRIT: 40.7 % (ref 38.4–49.9)
HGB: 14.6 g/dL (ref 13.0–17.1)
LYMPH%: 24.4 % (ref 14.0–49.0)
MCH: 30 pg (ref 27.2–33.4)
MCHC: 35.9 g/dL (ref 32.0–36.0)
MCV: 83.7 fL (ref 79.3–98.0)
MONO#: 0.2 10*3/uL (ref 0.1–0.9)
MONO%: 7.1 % (ref 0.0–14.0)
NEUT%: 65.2 % (ref 39.0–75.0)
NEUTROS ABS: 2.2 10*3/uL (ref 1.5–6.5)
PLATELETS: 113 10*3/uL — AB (ref 140–400)
RBC: 4.86 10*6/uL (ref 4.20–5.82)
RDW: 13.9 % (ref 11.0–14.6)
WBC: 3.4 10*3/uL — ABNORMAL LOW (ref 4.0–10.3)
lymph#: 0.8 10*3/uL — ABNORMAL LOW (ref 0.9–3.3)

## 2014-06-12 MED ORDER — IOHEXOL 300 MG/ML  SOLN
100.0000 mL | Freq: Once | INTRAMUSCULAR | Status: AC | PRN
  Administered 2014-06-12: 100 mL via INTRAVENOUS

## 2014-06-13 ENCOUNTER — Telehealth: Payer: Self-pay | Admitting: *Deleted

## 2014-06-13 LAB — CEA: CEA: <0.5 ng/mL (ref 0.0–5.0)

## 2014-06-13 NOTE — Telephone Encounter (Signed)
-----   Message from Russell Pier, MD sent at 06/12/2014  8:44 PM EDT ----- Please call patient, ct is negative for cancer, f/u as scheduled

## 2014-06-13 NOTE — Telephone Encounter (Signed)
Called and informed patient that CT is negative for cancer and to follow up as scheduled.  Per Dr. Benay Spice.  Patient verbalized understanding.

## 2014-06-18 ENCOUNTER — Telehealth: Payer: Self-pay | Admitting: Oncology

## 2014-06-18 ENCOUNTER — Ambulatory Visit (HOSPITAL_BASED_OUTPATIENT_CLINIC_OR_DEPARTMENT_OTHER): Payer: Medicare Other | Admitting: Oncology

## 2014-06-18 VITALS — BP 130/59 | HR 79 | Temp 98.5°F | Resp 17 | Ht 72.75 in | Wt 173.4 lb

## 2014-06-18 DIAGNOSIS — C182 Malignant neoplasm of ascending colon: Secondary | ICD-10-CM

## 2014-06-18 DIAGNOSIS — C180 Malignant neoplasm of cecum: Secondary | ICD-10-CM | POA: Diagnosis not present

## 2014-06-18 DIAGNOSIS — D6959 Other secondary thrombocytopenia: Secondary | ICD-10-CM | POA: Diagnosis not present

## 2014-06-18 DIAGNOSIS — D509 Iron deficiency anemia, unspecified: Secondary | ICD-10-CM

## 2014-06-18 DIAGNOSIS — I4891 Unspecified atrial fibrillation: Secondary | ICD-10-CM

## 2014-06-18 DIAGNOSIS — D6481 Anemia due to antineoplastic chemotherapy: Secondary | ICD-10-CM

## 2014-06-18 DIAGNOSIS — Z800 Family history of malignant neoplasm of digestive organs: Secondary | ICD-10-CM

## 2014-06-18 NOTE — Progress Notes (Signed)
[place] OFFICE PROGRESS NOTE   Diagnosis: Colon cancer  INTERVAL HISTORY:   [name] returns as scheduled. He continues to have numbness in the feet. He takes Adderall for difficulty focusing in the mornings. No other complaint.  Objective:  Vital signs in last 24 hours:  Blood pressure 130/59, pulse 79, temperature 98.5 F (36.9 C), temperature source Oral, resp. rate 17, height 6' 0.75" (1.848 m), weight 173 lb 6.4 oz (78.654 kg), SpO2 100 %.    HEENT: Right conjunctival hemorrhage, neck without mass Lymphatics: No cervical, supra lavicular, axillary, or inguinal nodes Resp: Lungs clear bilaterally Cardio: Regular rate and rhythm GI: No hepatosplenomegaly, nontender, no mass Vascular: No leg edema   Lab Results:  Lab Results  Component Value Date   WBC 3.4* 06/12/2014   HGB 14.6 06/12/2014   HCT 40.7 06/12/2014   MCV 83.7 06/12/2014   PLT 113* 06/12/2014   NEUTROABS 2.2 06/12/2014     Lab Results  Component Value Date   CEA <0.5 06/12/2014    Imaging: CTs of the chest, abdomen, and pelvis 06/12/2014-stable benign hepatic cyst. Persistent splenomegaly. No evidence of recurrent tumor or metastatic disease.  Medications: I have reviewed the patient's current medications.  Assessment/Plan: 1.Moderately differentiated adenocarcinoma of the cecum, stage IIIB (T4 N1), status post a right colectomy on 04/08/2011 -the tumor was microsatellite stable and K-ras wild-type . He began adjuvant CAPOX chemotherapy on 05/03/2011. He completed cycle 5 on 07/26/2011 (oxaliplatin was held from cycle 4). He completed cycle 7 beginning 09/06/2011. He completed cycle 8 beginning 09/27/2011.   CTs of the chest, abdomen, and pelvis 05/15/2013-negative for recurrent colon cancer  CTs of the chest, abdomen, and pelvis 06/12/2014-negative for recurrent colon cancer 2. History of colon polyps. He underwent a surveillance colonoscopy by Dr. Deatra Ina 03/07/2013  3.  History of anemia secondary to iron deficiency and chemotherapy. The hemoglobin has corrected with iron therapy  4. Postoperative abdominal wall/flank hematoma.  5. Sleep apnea.  6. History of atrial fibrillation.  7. History of esophageal stricture.  8. Family history of colon cancer and gastric cancer. The tumor was microsatellite stable and no mutation was found in the mismatch repair genes. A CancerNext panel was negative.  9. 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  11. 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 Abdominal wall hernia noted on a CT of the abdomen 12/03/2011.  13. Splenomegaly-noted on CT scans back to January of 2013.  14. Stool positive for occult blood 02/12/2013. Ferritin 38 02/14/2013. Colonoscopy and upper endoscopy 03/07/2013 with no source for GI blood loss identified. Capsule endoscopy 03/20/2013 negative. 15. Colonoscopy on 03/07/2013. At the anastomosis along the suture line there were several areas of prominent mucosa. Biopsies were taken. Moderate diverticulosis was noted in the sigmoid colon. Internal hemorrhoids also noted. No definite source for GI blood loss was identified. Biopsy of the anastomosis site showed benign colonic mucosa with inflamed granulation tissue. No adenomatous change or malignancy. He subsequently underwent an upper endoscopy (03/07/2013) which was normal.  16. Review of peripheral blood smear 01/30/2013-few metamyelocytes and myelocytes, mild decrease in platelets, normal red blood cells.  44. Right distal femur fracture October 2015 status post surgery.    Disposition:  [name] remains in clinical remission from colon cancer. He will return for an office visit and CEA in 6 months. He continues surveillance colonoscopies with Dr. Deatra Ina.  He has chronic splenomegaly and  mild thrombocytopenia.  He may have an underlying low-grade lymphoproliferative disorder or chronic liver disease. We will consider additional diagnostic evaluation with a bone marrow biopsy if he develops progressive thrombocytopenia or other hematologic abnormalities.  Betsy Coder, MD  06/18/2014  10:49 AM

## 2014-06-18 NOTE — Telephone Encounter (Signed)
gv adn printed appt sched and avs for pt for NOV °

## 2014-07-04 ENCOUNTER — Ambulatory Visit (INDEPENDENT_AMBULATORY_CARE_PROVIDER_SITE_OTHER): Payer: Medicare Other | Admitting: Gastroenterology

## 2014-07-04 ENCOUNTER — Other Ambulatory Visit: Payer: Medicare Other

## 2014-07-04 ENCOUNTER — Encounter: Payer: Self-pay | Admitting: Gastroenterology

## 2014-07-04 VITALS — BP 128/70 | HR 94 | Ht 73.0 in | Wt 175.0 lb

## 2014-07-04 DIAGNOSIS — D509 Iron deficiency anemia, unspecified: Secondary | ICD-10-CM

## 2014-07-04 DIAGNOSIS — C182 Malignant neoplasm of ascending colon: Secondary | ICD-10-CM

## 2014-07-04 DIAGNOSIS — Z8509 Personal history of malignant neoplasm of other digestive organs: Secondary | ICD-10-CM | POA: Diagnosis not present

## 2014-07-04 MED ORDER — NA SULFATE-K SULFATE-MG SULF 17.5-3.13-1.6 GM/177ML PO SOLN: 1.0000 | Freq: Once | ORAL | Status: DC

## 2014-07-04 NOTE — Progress Notes (Signed)
      History of Present Illness:  [name]  has a history of right sided colon cancer, iron deficiency anemia with Hemoccult-positive stool of unknown etiology, here for follow-up of multiple problems.  He underwent extensive workup one year ago including upper and lower endoscopy and capsule endoscopy.  No GI bleeding source was identified.  It was felt that he probably has AVM somewhere in the GI tract causing bleeding.  On supplementary iron hemoglobin has stabilized.  Earlier this month it was 14.6. He had a stage IIIB adenocarcinoma of the cecum and was resected in 2013.  Follow-up CTs including from earlier this month were negative for recurrent cancer.  He received postoperative chemotherapy.  He has no GI complaints.   Review of Systems: Pertinent positive and negative review of systems were noted in the above HPI section. All other review of systems were otherwise negative.    Current Medications, Allergies, Past Medical History, Past Surgical History, Family History and Social History were reviewed in [name] record  Vital signs were reviewed in today's medical record. Physical Exam: General: Well developed , well nourished, no acute distress Skin: anicteric Head: Normocephalic and atraumatic Eyes:  sclerae anicteric, EOMI Ears: Normal auditory acuity Mouth: No deformity or lesions Lymph Nodes: no lymphadenopathy Lungs: Clear throughout to auscultation Heart: Regular rate and rhythm; no murmurs, rubs or brui: Gastroinestinal:  Soft, non tender and non distended. No masses, hepatosplenomegaly or hernias noted. Normal Bowel sounds Rectal:deferred Musculoskeletal: Symmetrical with no gross deformities  Pulses:  Normal pulses noted Extremities: No clubbing, cyanosis, edema or deformities noted Neurological: Alert oriented x 4, grossly nonfocal Psychological:  Alert and cooperative. Normal mood and affect  See Assessment and Plan under Problem  List

## 2014-07-04 NOTE — Assessment & Plan Note (Signed)
See above recommendations.  Patient has a strong family history of multiple cancers.  Early surveillance of the upper and lower GI tract was recommended.  A coarsely, he'll be scheduled for both upper endoscopy and colonoscopy.    25  minutes were spent with the patient and/or family.  Greater than 50% was spent in counseling and coordination of care with the patient

## 2014-07-04 NOTE — Assessment & Plan Note (Signed)
Workup including upper and lower endoscopy and capsule endoscopy were negative for a GI bleeding source.  Presumably, he is losing blood from AVMs in the small bowel that were not appreciated by capsule study.  Hemoglobin has normalized with supplementary iron.  Recommendations #1 repeat Hemoccults #2 continue iron supplementation

## 2014-07-04 NOTE — Patient Instructions (Signed)
You have been scheduled for an endoscopy and colonoscopy. Please follow the written instructions given to you at your visit today. Please pick up your prep supplies at the pharmacy within the next 1-3 days. If you use inhalers (even only as needed), please bring them with you on the day of your procedure. Your physician has requested that you go to www.startemmi.com and enter the access code given to you at your visit today. This web site gives a general overview about your procedure. However, you should still follow specific instructions given to you by our office regarding your preparation for the procedure.  Go to the basement for labs today (IFOB)

## 2014-07-18 ENCOUNTER — Telehealth: Payer: Self-pay | Admitting: Gastroenterology

## 2014-07-18 NOTE — Telephone Encounter (Signed)
Suprep free sample kit out front Spoke with patient

## 2014-07-22 ENCOUNTER — Other Ambulatory Visit (INDEPENDENT_AMBULATORY_CARE_PROVIDER_SITE_OTHER): Payer: Medicare Other

## 2014-07-22 DIAGNOSIS — C182 Malignant neoplasm of ascending colon: Secondary | ICD-10-CM | POA: Diagnosis not present

## 2014-07-22 DIAGNOSIS — D509 Iron deficiency anemia, unspecified: Secondary | ICD-10-CM | POA: Diagnosis not present

## 2014-07-22 LAB — FECAL OCCULT BLOOD, IMMUNOCHEMICAL: Fecal Occult Bld: NEGATIVE

## 2014-07-23 ENCOUNTER — Encounter: Payer: Self-pay | Admitting: Gastroenterology

## 2014-07-24 NOTE — Progress Notes (Signed)
Quick Note:  Please inform the patient that 2 Hemoccults was negative. No further GI workup. ______

## 2014-08-14 ENCOUNTER — Encounter: Payer: Self-pay | Admitting: Gastroenterology

## 2014-09-11 ENCOUNTER — Encounter: Payer: Medicare Other | Admitting: Gastroenterology

## 2014-10-10 DIAGNOSIS — I8500 Esophageal varices without bleeding: Secondary | ICD-10-CM | POA: Insufficient documentation

## 2014-10-10 HISTORY — PX: COLONOSCOPY: SHX174

## 2014-10-15 ENCOUNTER — Ambulatory Visit (AMBULATORY_SURGERY_CENTER): Payer: Self-pay

## 2014-10-15 VITALS — Ht 74.0 in | Wt 181.0 lb

## 2014-10-15 DIAGNOSIS — Z85038 Personal history of other malignant neoplasm of large intestine: Secondary | ICD-10-CM

## 2014-10-15 DIAGNOSIS — Z800 Family history of malignant neoplasm of digestive organs: Secondary | ICD-10-CM

## 2014-10-15 DIAGNOSIS — D509 Iron deficiency anemia, unspecified: Secondary | ICD-10-CM

## 2014-10-15 NOTE — Progress Notes (Signed)
Per pt, no allergies to soy or egg products.Pt not taking any weight loss meds or using  O2 at home. 

## 2014-10-28 ENCOUNTER — Telehealth: Payer: Self-pay

## 2014-10-28 ENCOUNTER — Encounter: Payer: Self-pay | Admitting: Gastroenterology

## 2014-10-28 ENCOUNTER — Ambulatory Visit (AMBULATORY_SURGERY_CENTER): Payer: Medicare Other | Admitting: Gastroenterology

## 2014-10-28 ENCOUNTER — Other Ambulatory Visit: Payer: Self-pay

## 2014-10-28 ENCOUNTER — Other Ambulatory Visit (INDEPENDENT_AMBULATORY_CARE_PROVIDER_SITE_OTHER): Payer: Medicare Other

## 2014-10-28 VITALS — BP 128/65 | HR 74 | Temp 97.9°F | Resp 20 | Ht 74.0 in | Wt 181.0 lb

## 2014-10-28 DIAGNOSIS — D509 Iron deficiency anemia, unspecified: Secondary | ICD-10-CM

## 2014-10-28 DIAGNOSIS — Z85038 Personal history of other malignant neoplasm of large intestine: Secondary | ICD-10-CM

## 2014-10-28 DIAGNOSIS — K317 Polyp of stomach and duodenum: Secondary | ICD-10-CM | POA: Diagnosis not present

## 2014-10-28 DIAGNOSIS — Z79899 Other long term (current) drug therapy: Secondary | ICD-10-CM | POA: Diagnosis not present

## 2014-10-28 DIAGNOSIS — I8500 Esophageal varices without bleeding: Secondary | ICD-10-CM

## 2014-10-28 DIAGNOSIS — R161 Splenomegaly, not elsewhere classified: Secondary | ICD-10-CM

## 2014-10-28 DIAGNOSIS — R195 Other fecal abnormalities: Secondary | ICD-10-CM

## 2014-10-28 LAB — BUN: BUN: 11 mg/dL (ref 6–23)

## 2014-10-28 LAB — FERRITIN: Ferritin: 108.6 ng/mL (ref 22.0–322.0)

## 2014-10-28 LAB — CREATININE, SERUM: CREATININE: 0.94 mg/dL (ref 0.40–1.50)

## 2014-10-28 MED ORDER — NADOLOL 40 MG PO TABS: 40.0000 mg | ORAL_TABLET | Freq: Every day | ORAL | Status: DC

## 2014-10-28 MED ORDER — SODIUM CHLORIDE 0.9 % IV SOLN: 500.0000 mL | INTRAVENOUS | Status: DC

## 2014-10-28 NOTE — Patient Instructions (Signed)
Discharge instructions given. Handout on diverticulosis. Lab work to be drawn upon discharge at large. Repeat CT scan of the abdomin and pelvis. Contrast given in recovery. Office visit in 3-4 weeks office will schedule. YOU HAD AN ENDOSCOPIC PROCEDURE TODAY AT [facility]:   Refer to the procedure report that was given to you for any specific questions about what was found during the examination.  If the procedure report does not answer your questions, please call your gastroenterologist to clarify.  If you requested that your care partner not be given the details of your procedure findings, then the procedure report has been included in a sealed envelope for you to review at your convenience later.  YOU SHOULD EXPECT: Some feelings of bloating in the abdomen. Passage of more gas than usual.  Walking can help get rid of the air that was put into your GI tract during the procedure and reduce the bloating. If you had a lower endoscopy (such as a colonoscopy or flexible sigmoidoscopy) you may notice spotting of blood in your stool or on the toilet paper. If you underwent a bowel prep for your procedure, you may not have a normal bowel movement for a few days.  Please Note:  You might notice some irritation and congestion in your nose or some drainage.  This is from the oxygen used during your procedure.  There is no need for concern and it should clear up in a day or so.  SYMPTOMS TO REPORT IMMEDIATELY:   Following lower endoscopy (colonoscopy or flexible sigmoidoscopy):  Excessive amounts of blood in the stool  Significant tenderness or worsening of abdominal pains  Swelling of the abdomen that is new, acute  Fever of 100F or higher   Following upper endoscopy (EGD)  Vomiting of blood or coffee ground material  New chest pain or pain under the shoulder blades  Painful or persistently difficult swallowing  New shortness of breath  Fever of 100F or higher  Black,  tarry-looking stools  For urgent or emergent issues, a gastroenterologist can be reached at any hour by calling [phone].   DIET: Your first meal following the procedure should be a small meal and then it is ok to progress to your normal diet. Heavy or fried foods are harder to digest and may make you feel nauseous or bloated.  Likewise, meals heavy in dairy and vegetables can increase bloating.  Drink plenty of fluids but you should avoid alcoholic beverages for 24 hours.  ACTIVITY:  You should plan to take it easy for the rest of today and you should NOT DRIVE or use heavy machinery until tomorrow (because of the sedation medicines used during the test).    FOLLOW UP: Our staff will call the number listed on your records the next business day following your procedure to check on you and address any questions or concerns that you may have regarding the information given to you following your procedure. If we do not reach you, we will leave a message.  However, if you are feeling well and you are not experiencing any problems, there is no need to return our call.  We will assume that you have returned to your regular daily activities without incident.  If any biopsies were taken you will be contacted by phone or by letter within the next 1-3 weeks.  Please call us at [phone] if you have not heard about the biopsies in 3 weeks.    SIGNATURES/CONFIDENTIALITY: You and/or  your care partner have signed paperwork which will be entered into your electronic medical record.  These signatures attest to the fact that that the information above on your After Visit Summary has been reviewed and is understood.  Full responsibility of the confidentiality of this discharge information lies with you and/or your care-partner.

## 2014-10-28 NOTE — Op Note (Signed)
[place] [facility]  [address]. [place], [address]   ENDOSCOPY PROCEDURE REPORT  PATIENT: Philbert, Ocallaghan  MR#: [id number] BIRTHDATE: 07-22-1940 , 74  yrs. old GENDER: male ENDOSCOPIST: Inda Castle, MD REFERRED BY:  Arturo Morton, M.D.  Stevie Kern, MD PROCEDURE DATE:  10/28/2014 PROCEDURE:  EGD w/ biopsy ASA CLASS:     Class II INDICATIONS:  iron deficiency anemia. MEDICATIONS: Residual sedation present, Monitored anesthesia care, and Propofol 150 mg IV TOPICAL ANESTHETIC:  DESCRIPTION OF PROCEDURE: After the risks benefits and alternatives of the procedure were thoroughly explained, informed consent was obtained.  The LB [id number] endoscope was introduced through the mouth and advanced to the second portion of the duodenum , Without limitations.  The instrument was slowly withdrawn as the mucosa was fully examined.      STOMACH: Gastropathy was found.   There are areas of hemorrhagic mucosa along with marked edema in the gastric cardia fundus and body consistent with portal hypertensive gastropathy  ESOPHAGUS: There were 3 columns of varices in the distal esophagus. grade 2-3 varices were present in the distal esophagus. Retroflexed views revealed no abnormalities.     The scope was then withdrawn from the patient and the procedure completed.  COMPLICATIONS: There were no immediate complications.  ENDOSCOPIC IMPRESSION: Ga 1.  esophageal varices 2.  Portal hypertensive gastropathy  Findings in the stomach may explain iron deficiency anemia  RECOMMENDATIONS: check serologies for hepatitis A, B, and C, ferritin, anti-mitochondrial antibody, antinuclear antibody repeat CT of the abdomen and pelvis Begin nadolol 40 mg daily Office visit 3-4 weeks  REPEAT EXAM:  eSigned:  Inda Castle, MD 10/28/2014 10:52 AM    CC:  PATIENT NAME:  Cutler, Sunday MR#: [id number]

## 2014-10-28 NOTE — Progress Notes (Signed)
Called to room to assist during endoscopic procedure.  Patient ID and intended procedure confirmed with present staff. Received instructions for my participation in the procedure from the performing physician.  

## 2014-10-28 NOTE — Telephone Encounter (Signed)
Patient is advised of CT appointment and of his follow up appointment.

## 2014-10-28 NOTE — Progress Notes (Signed)
Report to PACU, RN, vss, BBS= Clear.  

## 2014-10-28 NOTE — Telephone Encounter (Signed)
Scheduled for CT abd/pelvis on 11/01/14. BUN and Creatinine added to labs drawn today. Left a message for the patient to call back

## 2014-10-28 NOTE — Op Note (Signed)
[place]  [address]. [place], [address]   COLONOSCOPY PROCEDURE REPORT  PATIENT: [name], [name]  MR#: [id number] BIRTHDATE: [birth year] , 74  yrs. old GENDER: male ENDOSCOPIST: Inda Castle, MD REFERRED VQ:[id number] Edrick Kins, M.D. PROCEDURE DATE:  10/28/2014 PROCEDURE:   Colonoscopy, surveillance First Screening Colonoscopy - Avg.  risk and is 50 yrs.  old or older - No.  Prior Negative Screening - Now for repeat screening. N/A  History of Adenoma - Now for follow-up colonoscopy & has been > or = to 3 yrs.  No.  It has been less than 3 yrs since last colonoscopy.  Other: See Comments  Polyps removed today? No Recommend repeat exam, <10 yrs? Yes high risk ASA CLASS:   Class II INDICATIONS:PH Colon or Rectal Adenocarcinoma.  2013 MEDICATIONS: Monitored anesthesia care and Propofol 150 mg IV  DESCRIPTION OF PROCEDURE:   After the risks benefits and alternatives of the procedure were thoroughly explained, informed consent was obtained.  The digital rectal exam revealed no abnormalities of the rectum.   The LB [id number]  endoscope was introduced through the anus and advanced to the surgical anastomosis. No adverse events experienced.   The quality of the prep was (Suprep was used) excellent.  The instrument was then slowly withdrawn as the colon was fully examined. Estimated blood loss is zero unless otherwise noted in this procedure report.      COLON FINDINGS: There was moderate diverticulosis noted in the descending colon and sigmoid colon.  Retroflexed views revealed no abnormalities. The time to cecum = 3.5 Withdrawal time = 5.5   The scope was withdrawn and the procedure completed. COMPLICATIONS: There were no immediate complications.  ENDOSCOPIC IMPRESSION: Moderate diverticulosis was noted in the descending colon and sigmoid colon  RECOMMENDATIONS: Repeat Colonoscopy in 3 years.  eSigned:  Inda Castle, MD 10/28/2014 10:44  AM   cc:

## 2014-10-29 ENCOUNTER — Telehealth: Payer: Self-pay | Admitting: *Deleted

## 2014-10-29 LAB — HEPATITIS A ANTIBODY, TOTAL: Hep A Total Ab: REACTIVE — AB

## 2014-10-29 LAB — ANA: ANA: NEGATIVE

## 2014-10-29 LAB — HEPATITIS C ANTIBODY: HCV AB: NEGATIVE

## 2014-10-29 LAB — HEPATITIS B SURFACE ANTIGEN: HEP B S AG: NEGATIVE

## 2014-10-29 LAB — HEPATITIS B SURFACE ANTIBODY,QUALITATIVE: HEP B S AB: NEGATIVE

## 2014-10-29 NOTE — Telephone Encounter (Signed)
  Follow up Call-  Call back number 10/28/2014 03/07/2013 03/28/2012  Post procedure Call Back phone  # [phone] hm [phone]  Permission to leave phone message Yes Yes Yes     Patient questions:  Do you have a fever, pain , or abdominal swelling? No. Pain Score  0 *  Have you tolerated food without any problems? Yes.    Have you been able to return to your normal activities? Yes.    Do you have any questions about your discharge instructions: Diet   No. Medications  No. Follow up visit  No.  Do you have questions or concerns about your Care? No.  Actions: * If pain score is 4 or above: No action needed, pain <4.

## 2014-10-30 LAB — MITOCHONDRIAL ANTIBODIES: MITOCHONDRIAL M2 AB, IGG: 0.32 (ref <0.91)

## 2014-10-31 ENCOUNTER — Encounter: Payer: Self-pay | Admitting: Gastroenterology

## 2014-11-01 ENCOUNTER — Ambulatory Visit (INDEPENDENT_AMBULATORY_CARE_PROVIDER_SITE_OTHER)
Admission: RE | Admit: 2014-11-01 | Discharge: 2014-11-01 | Disposition: A | Discharge status: Home / Self Care | Payer: Medicare Other | Source: Ambulatory Visit | Attending: Gastroenterology | Admitting: Gastroenterology

## 2014-11-01 DIAGNOSIS — Z85038 Personal history of other malignant neoplasm of large intestine: Secondary | ICD-10-CM

## 2014-11-01 DIAGNOSIS — R161 Splenomegaly, not elsewhere classified: Secondary | ICD-10-CM

## 2014-11-01 DIAGNOSIS — R195 Other fecal abnormalities: Secondary | ICD-10-CM

## 2014-11-01 DIAGNOSIS — Z79899 Other long term (current) drug therapy: Secondary | ICD-10-CM | POA: Diagnosis not present

## 2014-11-01 MED ORDER — IOHEXOL 300 MG/ML  SOLN
100.0000 mL | Freq: Once | INTRAMUSCULAR | Status: AC | PRN
  Administered 2014-11-01: 100 mL via INTRAVENOUS

## 2014-11-05 ENCOUNTER — Telehealth: Payer: Self-pay | Admitting: Gastroenterology

## 2014-11-05 NOTE — Telephone Encounter (Signed)
Lab and ct look ok.  No specific abnormalities

## 2014-11-05 NOTE — Telephone Encounter (Signed)
Patient is calling on his lab results from 10-28-14 along with the CT scan 11-01-14. Looks like results have been processed but did not see results to notify patient. Please advise

## 2014-11-06 NOTE — Telephone Encounter (Signed)
Patient notified and verbalized understanding. Patient has follow up appt scheduled for 11-26-14.

## 2014-11-26 ENCOUNTER — Ambulatory Visit: Payer: Medicare Other | Admitting: Gastroenterology

## 2014-11-28 ENCOUNTER — Telehealth: Payer: Self-pay | Admitting: Gastroenterology

## 2014-11-28 ENCOUNTER — Telehealth: Payer: Self-pay | Admitting: Family Medicine

## 2014-11-28 NOTE — Telephone Encounter (Signed)
Pt states he has had several nose bleeds recently. Pt wanted to know if nosebleeds could be related to liver issues. DIscussed with pt that liver function could alter lab values. Pt has appt with PCP tomorrow. Pt instructed to keep that appt. Pt verbalized understanding.

## 2014-11-28 NOTE — Telephone Encounter (Signed)
Patient Name: [name]  DOB: 05/19/1940    Initial Comment Caller states c/o nosebleed   Nurse Assessment  Nurse: Raphael Gibney, RN, Vera Date/Time (Eastern Time): 11/28/2014 1:10:06 PM  Confirm and document reason for call. If symptomatic, describe symptoms. ---Caller states he has nosebleeds occasionally. Not bleeding right now. No blood clots. He can blow his nose and it is not always bloody. Has been diagnosed with portal hypertension and esophageal varices. Has more than 1 nosebleeds every day. Slept fine during the night.  Has the patient traveled out of the country within the last 30 days? ---Not Applicable  Does the patient have any new or worsening symptoms? ---Yes  Will a triage be completed? ---Yes  Related visit to physician within the last 2 weeks? ---No  Does the PT have any chronic conditions? (i.e. diabetes, asthma, etc.) ---Yes  List chronic conditions. ---HTN; esophageal varices; portal hypertension; low platelet count     Guidelines    Guideline Title Affirmed Question Affirmed Notes  Nosebleed [1] Bleeding recurs 3 or more times in 24 hours AND [2] direct pressure applied correctly    Final Disposition User   See Physician within 24 Hours [place], RN, Vera    Comments  Called primary number and left message.  Appt scheduled for 9:30 am 11/29/14 with Dr. Shanon Ace   Referrals  REFERRED TO PCP OFFICE   Disagree/Comply: Comply

## 2014-11-28 NOTE — Telephone Encounter (Signed)
Left message on machine for patient to return our call 

## 2014-11-29 ENCOUNTER — Encounter: Payer: Self-pay | Admitting: Internal Medicine

## 2014-11-29 ENCOUNTER — Ambulatory Visit (INDEPENDENT_AMBULATORY_CARE_PROVIDER_SITE_OTHER): Payer: Medicare Other | Admitting: Internal Medicine

## 2014-11-29 VITALS — BP 146/82 | Temp 97.7°F | Ht 74.0 in | Wt 180.4 lb

## 2014-11-29 DIAGNOSIS — R040 Epistaxis: Secondary | ICD-10-CM

## 2014-11-29 DIAGNOSIS — R030 Elevated blood-pressure reading, without diagnosis of hypertension: Secondary | ICD-10-CM

## 2014-11-29 DIAGNOSIS — D696 Thrombocytopenia, unspecified: Secondary | ICD-10-CM

## 2014-11-29 DIAGNOSIS — K766 Portal hypertension: Secondary | ICD-10-CM

## 2014-11-29 DIAGNOSIS — I8500 Esophageal varices without bleeding: Secondary | ICD-10-CM

## 2014-11-29 DIAGNOSIS — IMO0001 Reserved for inherently not codable concepts without codable children: Secondary | ICD-10-CM

## 2014-11-29 LAB — CBC WITH DIFFERENTIAL/PLATELET
BASOS ABS: 0 10*3/uL (ref 0.0–0.1)
Basophils Relative: 0.3 % (ref 0.0–3.0)
EOS ABS: 0.1 10*3/uL (ref 0.0–0.7)
Eosinophils Relative: 3.6 % (ref 0.0–5.0)
HEMATOCRIT: 41.6 % (ref 39.0–52.0)
Hemoglobin: 14.2 g/dL (ref 13.0–17.0)
LYMPHS PCT: 28.5 % (ref 12.0–46.0)
Lymphs Abs: 1.2 10*3/uL (ref 0.7–4.0)
MCHC: 34.2 g/dL (ref 30.0–36.0)
MCV: 87 fl (ref 78.0–100.0)
MONO ABS: 0.3 10*3/uL (ref 0.1–1.0)
Monocytes Relative: 7.1 % (ref 3.0–12.0)
NEUTROS ABS: 2.5 10*3/uL (ref 1.4–7.7)
Neutrophils Relative %: 60.5 % (ref 43.0–77.0)
PLATELETS: 115 10*3/uL — AB (ref 150.0–400.0)
RBC: 4.78 Mil/uL (ref 4.22–5.81)
RDW: 14.7 % (ref 11.5–15.5)
WBC: 4.1 10*3/uL (ref 4.0–10.5)

## 2014-11-29 LAB — IBC PANEL
IRON: 79 ug/dL (ref 42–165)
SATURATION RATIOS: 19.9 % — AB (ref 20.0–50.0)
TRANSFERRIN: 283 mg/dL (ref 212.0–360.0)

## 2014-11-29 LAB — PROTIME-INR
INR: 1.1 ratio — AB (ref 0.8–1.0)
PROTHROMBIN TIME: 12.5 s (ref 9.6–13.1)

## 2014-11-29 LAB — FERRITIN: Ferritin: 79.9 ng/mL (ref 22.0–322.0)

## 2014-11-29 LAB — HEPATIC FUNCTION PANEL
ALK PHOS: 88 U/L (ref 39–117)
ALT: 19 U/L (ref 0–53)
AST: 17 U/L (ref 0–37)
Albumin: 4.2 g/dL (ref 3.5–5.2)
BILIRUBIN DIRECT: 0.3 mg/dL (ref 0.0–0.3)
BILIRUBIN TOTAL: 1.5 mg/dL — AB (ref 0.2–1.2)
TOTAL PROTEIN: 6.9 g/dL (ref 6.0–8.3)

## 2014-11-29 NOTE — Telephone Encounter (Signed)
Patient was seen by Dr Regis Bill.

## 2014-11-29 NOTE — Progress Notes (Signed)
Pre visit review using our clinic review tool, if applicable. No additional management support is needed unless otherwise documented below in the visit note.  Chief Complaint  Patient presents with  . Nosebleeds    HPI: Patient [name]  comes in today for SDA for  new problem evaluation.  PCP NA   he is concerned w recurrent right nosebleed for about 3 days. No history of same no pain mild sinus congestion. Stops on its own feels  High innose always on the right .but no PNbleedlin  However he has concerns because of his recent evaluation and diagnosis and underlying problems.  Dx sept 19th esophageal varices and portal HT.  Has appt with new doc   Armbroser.   Stopped   Sept 19  Asa when had dx.    Put on bp pill to help pressure.  Blood tests seem ok when last done.  He has a history of thrombocytopenia but never low enough to make him bleed. No history of bleeding disorder bleeding family  Has a history of colon cancer but not aware of any blood in stool. No unusual bruises or bleeding.  Does have a concern because of this new diagnosis no alcohol no family history of liver disease. Does have cysts in his liver.  He doesn't have a diagnosis of hypertension is concerned his blood pressure was up some today. No vomiting change in bowel habits now. ROS: See pertinent positives and negatives per HPI. No cough feer cp sob new  Past Medical History  Diagnosis Date  . Diverticulosis     Sigmoid colon  . Personal history of colonic polyps 09/17/2009    Tubular adenoma  . History of hemorrhoids   . Hyperlipidemia   . Anemia   . GERD (gastroesophageal reflux disease) 03-31-11    tx. Omeprazole  . Atrial fibrillation [facility]) 2006    Dr Tonny Bollman af-no meds  . Hypercholesterolemia   . Hernia, abdominal     at the umbilicus  . colon ca dx'd 02/2011  . Pneumonia 01/2013  . OSA on CPAP dx'd 2010    uses cap nightly  . Arthritis     "thumbs" (11/29/2013)  . TBI  (traumatic brain injury) ([facility]) 1986    S/P fall; "slow to get going q morning" (10//22/2015)  . Fracture of distal femur ([facility]) 11/29/2013  . History of irregular heartbeat   . Hearing loss     has bilateral hearing aids    Family History  Problem Relation Age of Onset  . Diabetes Father   . Prostate cancer Father   . Heart attack Father 23  . Heart failure Mother     Congestive heart failure  . Cancer Brother     colon  . Colon cancer Brother 56  . Stomach cancer Sister   . Coronary artery disease Brother     CABG  . Anesthesia problems Neg Hx   . Esophageal cancer Neg Hx   . Colon cancer Other   . Colon cancer Sister     Social History   Social History  . Marital Status: Married    Spouse Name: N/A  . Number of Children: 2  . Years of Education: N/A   Occupational History  . Retired    Social History Main Topics  . Smoking status: Former Smoker -- 0.50 packs/day for 7 years    Types: Cigarettes    Quit date: 03/21/1961  . Smokeless tobacco: Never Used  . Alcohol Use:  No  . Drug Use: No  . Sexual Activity: Yes   Other Topics Concern  . None   Social History Narrative    Outpatient Prescriptions Prior to Visit  Medication Sig Dispense Refill  . amphetamine-dextroamphetamine (ADDERALL) 10 MG tablet Take 30 mg by mouth every morning.     Marland Kitchen b complex vitamins tablet Take 1 tablet by mouth every evening.     . docusate sodium (COLACE) 100 MG capsule Take 1 capsule (100 mg total) by mouth 2 (two) times daily. 30 capsule 1  . ferrous sulfate 325 (65 FE) MG tablet Take 325 mg by mouth 2 (two) times daily.    Marland Kitchen gabapentin (NEURONTIN) 100 MG capsule One by mouth twice a day 200 capsule 3  . Multiple Vitamin (MULTIVITAMIN WITH MINERALS) TABS tablet Take 1 tablet by mouth every evening.     . nadolol (CORGARD) 40 MG tablet Take 1 tablet (40 mg total) by mouth daily. 30 tablet 2  . omeprazole (PRILOSEC) 10 MG capsule TAKE 1 BY MOUTH TWICE DAILY 60 capsule 11  .  sildenafil (VIAGRA) 100 MG tablet Take 1 tablet (100 mg total) by mouth daily as needed for erectile dysfunction. 10 tablet 10  . simvastatin (ZOCOR) 20 MG tablet Take 1 tablet (20 mg total) by mouth at bedtime. 100 tablet 3  . aspirin 81 MG tablet Take 81 mg by mouth daily.     No facility-administered medications prior to visit.     EXAM:  BP 146/82 mmHg  Temp(Src) 97.7 F (36.5 C) (Oral)  Ht 6\' 2"  (1.88 m)  Wt 180 lb 6.4 oz (81.829 kg)  BMI 23.15 kg/m2  Body mass index is 23.15 kg/(m^2).  GENERAL: vitals reviewed and listed above, alert, oriented, appears well hydrated and in no acute distress hearing aids  Looks well  HEENT: atraumatic, conjunctiva  clear, no obvious abnormalities on inspection of external nose and ears OP : no lesion edema or exudate  Nose no bleood or bleeding point seen  Patent and no facial tenderness NECK: no obvious masses on inspection palpation  LUNGS: clear to auscultation bilaterally, no wheezes, rales or rhonchi, CV: HRRR, no clubbing cyanosis or  peripheral edema nl cap refill  abd no tenderness  Liver just  below rcm non tender  Bu percusion noascites  Seen  MS: moves all extremities without noticeable focal  abnormality PSYCH: pleasant and cooperative, no obvious depression or anxiety Lab Results  Component Value Date   WBC 3.4* 06/12/2014   HGB 14.6 06/12/2014   HCT 40.7 06/12/2014   PLT 113* 06/12/2014   GLUCOSE 133 06/12/2014   CHOL 127 04/10/2014   TRIG 71.0 04/10/2014   HDL 46.20 04/10/2014   LDLCALC 67 04/10/2014   ALT 21 06/12/2014   AST 15 06/12/2014   NA 142 06/12/2014   K 4.4 06/12/2014   CL 106 04/10/2014   CREATININE 0.94 10/28/2014   BUN 11 10/28/2014   CO2 25 06/12/2014   TSH 1.54 04/10/2014   PSA 2.87 04/10/2014   INR 1.20 11/29/2013   HGBA1C 5.0 12/16/2008   MICROALBUR 0.2 12/16/2008    ASSESSMENT AND PLAN:  Discussed the following assessment and plan:  Frequent nosebleeds - r side only 3 days no other alarm  features for bleeding - Plan: CBC with Differential/Platelet, Hepatic function panel, Ferritin, IBC panel, Protime-INR  Portal hypertension with esophageal varices ([place]) - by report  - Plan: CBC with Differential/Platelet, Hepatic function panel, Ferritin, IBC panel, Protime-INR  Thrombocytopenia (HCC)  Elevated BP readings  - better on repeat  140 range  -Patient advised to return or notify health care team  if symptoms worsen ,persist or new concerns arise.  Patient Instructions  Labs today  To check platelet   count.  Clotting screen I think that your nosebleed is probably not related. Saline nose spray drop keep moisturized if recurrent and progressive may need to see ear nose and throat physician. Let you know about lab tests keep appointment with your new GI doctor. Blood pressure slightly elevated today but non-alarming. Make a follow-up visit with Dr. Sherren Mocha.       Standley Brooking. Tenecia Ignasiak M.D.  IMPRESSION: Stable exam. No evidence of recurrent or metastatic carcinoma within the abdomen or pelvis.  Stable mild splenomegaly.  Cholelithiasis. No radiographic evidence of cholecystitis.  Colonic diverticulosis. No radiographic evidence of diverticulitis.  Stable mildly enlarged prostate, and diffuse bladder wall thickening likely due to chronic bladder outlet obstruction.   Electronically Signed  By: Earle Gell M.D.  On: 11/01/2014 14:35

## 2014-11-29 NOTE — Patient Instructions (Addendum)
Labs today  To check platelet   count.  Clotting screen I think that your nosebleed is probably not related. Saline nose spray drop keep moisturized if recurrent and progressive may need to see ear nose and throat physician. Let you know about lab tests keep appointment with your new GI doctor. Blood pressure slightly elevated today but non-alarming. Make a follow-up visit with Dr. Sherren Mocha.

## 2014-12-06 ENCOUNTER — Telehealth: Payer: Self-pay | Admitting: Oncology

## 2014-12-06 ENCOUNTER — Ambulatory Visit (INDEPENDENT_AMBULATORY_CARE_PROVIDER_SITE_OTHER): Payer: Medicare Other | Admitting: Gastroenterology

## 2014-12-06 ENCOUNTER — Other Ambulatory Visit (INDEPENDENT_AMBULATORY_CARE_PROVIDER_SITE_OTHER): Payer: Medicare Other

## 2014-12-06 ENCOUNTER — Encounter: Payer: Self-pay | Admitting: Gastroenterology

## 2014-12-06 VITALS — BP 118/62 | HR 60 | Ht 71.75 in | Wt 182.5 lb

## 2014-12-06 DIAGNOSIS — R161 Splenomegaly, not elsewhere classified: Secondary | ICD-10-CM | POA: Diagnosis not present

## 2014-12-06 DIAGNOSIS — I8500 Esophageal varices without bleeding: Secondary | ICD-10-CM | POA: Diagnosis not present

## 2014-12-06 DIAGNOSIS — D649 Anemia, unspecified: Secondary | ICD-10-CM

## 2014-12-06 DIAGNOSIS — K3189 Other diseases of stomach and duodenum: Secondary | ICD-10-CM | POA: Diagnosis not present

## 2014-12-06 DIAGNOSIS — K766 Portal hypertension: Secondary | ICD-10-CM

## 2014-12-06 DIAGNOSIS — Z23 Encounter for immunization: Secondary | ICD-10-CM | POA: Diagnosis not present

## 2014-12-06 LAB — IGA: IgA: 275 mg/dL (ref 68–378)

## 2014-12-06 NOTE — Telephone Encounter (Signed)
Returned patient call re r/s 11/1 lab. Gave patient new appointment date/time for 10/31.

## 2014-12-06 NOTE — Progress Notes (Signed)
HPI :  74 y/o male, former patient of Dr. Deatra Ina, new to me, here for reassessment for iron deficiency anemia. He has a history of right sided colon cancer, stage IIIB adenocarcinoma of the cecum, s/p resection in 2013. He has otherwise been followed for iron deficiency anemia historically in association with hemeoccult positive stools of unknown etiology. He has had prior EGD, colonoscopy, and capsule endoscopy as outlined below. Most recent EGD from last month was remarkable for multiple columns of esophageal varices and portal hypertensive gastropathy, concerning for underlying cirrhosis of the liver. His colonoscopy did not show any source of anemia. His most recent Hgb is stable on oral iron.   He has no history of liver disease. No FH of liver disease or HCC. No history of jaundice. He does have some edema in his LE which is usually mild., and endorses some distension of his abdomen at times but he has never been found to have ascites. He reports he has been on nadolol since being seen with varices and tolerating it well. He is taking omeprazole 20mg  daily. He takes NSAIDs only rarely. He has a history of AF and has been off aspirin more recently due to anemia. He denies any alcohol use or prior history of heavy alcohol use.  Patient's sister had colon cancer.   Most recent workup CT abdomen - splenomegaly, enlarged prostate, bladder thickening - no mention of liver cirrhosis EGD 10/28/14 - esophageal varices and portal hypertensive gastropathy Colonoscopy 10/28/14 - diverticulosis otherwise normal Capsule endoscopy 03/2013 - normal    Past Medical History  Diagnosis Date  . Diverticulosis     Sigmoid colon  . Personal history of colonic polyps 09/17/2009    Tubular adenoma  . History of hemorrhoids   . Hyperlipidemia   . Anemia   . GERD (gastroesophageal reflux disease) 03-31-11    tx. Omeprazole  . Atrial fibrillation [facility]) 2006    Dr Tonny Bollman af-no meds  . Hypercholesterolemia     . Hernia, abdominal     at the umbilicus  . colon ca dx'd 02/2011  . Pneumonia 01/2013  . OSA on CPAP dx'd 2010    uses cap nightly  . Arthritis     "thumbs" (11/29/2013)  . TBI (traumatic brain injury) ([place]) 1986    S/P fall; "slow to get going q morning" (10//22/2015)  . Fracture of distal femur ([place]) 11/29/2013  . History of irregular heartbeat   . Hearing loss     has bilateral hearing aids     Past Surgical History  Procedure Laterality Date  . Laparoscopic partial colectomy  04/08/2011  . Colon surgery  2013  . Cardiac catheterization  07/2002    Dr Wynonia Lawman  . Portacath placement  04/30/2011    Procedure: INSERTION PORT-A-CATH;  Surgeon: Odis Hollingshead, MD;  Location: [facility];  Service: General;  Laterality: Right;  . Port-a-cath removal  11/09/2011    Procedure: REMOVAL PORT-A-CATH;  Surgeon: Odis Hollingshead, MD;  Location: [facility];  Service: General;  Laterality: N/A;  . Appendectomy  03/2011  . Orif distal femur fracture Right 11/29/2013    "put plate in"  . Inguinal hernia repair Left ~ 1991  . Orif femur fracture Right 11/29/2013    Procedure: OPEN REDUCTION INTERNAL FIXATION (ORIF) RIGHT  DISTAL FEMUR FRACTURE;  Surgeon: Rozanna Box, MD;  Location: [facility];  Service: Orthopedics;  Laterality: Right;   Family History  Problem Relation Age of Onset  .  Diabetes Father   . Prostate cancer Father   . Heart attack Father 15  . Heart failure Mother     Congestive heart failure  . Cancer Brother     colon  . Colon cancer Brother 63  . Stomach cancer Sister   . Coronary artery disease Brother     CABG  . Anesthesia problems Neg Hx   . Esophageal cancer Neg Hx   . Colon cancer Other   . Colon cancer Sister    Social History  Substance Use Topics  . Smoking status: Former Smoker -- 0.50 packs/day for 7 years    Types: Cigarettes    Quit date: 03/21/1961  . Smokeless tobacco: Never Used  . Alcohol Use: No   Current Outpatient Prescriptions   Medication Sig Dispense Refill  . amphetamine-dextroamphetamine (ADDERALL) 10 MG tablet Take 30 mg by mouth every morning.     Marland Kitchen b complex vitamins tablet Take 1 tablet by mouth every evening.     . ferrous sulfate 325 (65 FE) MG tablet Take 325 mg by mouth 2 (two) times daily.    Marland Kitchen gabapentin (NEURONTIN) 100 MG capsule One by mouth twice a day 200 capsule 3  . Multiple Vitamin (MULTIVITAMIN WITH MINERALS) TABS tablet Take 1 tablet by mouth every evening.     . nadolol (CORGARD) 40 MG tablet Take 1 tablet (40 mg total) by mouth daily. 30 tablet 2  . omeprazole (PRILOSEC) 10 MG capsule TAKE 1 BY MOUTH TWICE DAILY 60 capsule 11  . sildenafil (VIAGRA) 100 MG tablet Take 1 tablet (100 mg total) by mouth daily as needed for erectile dysfunction. 10 tablet 10  . simvastatin (ZOCOR) 20 MG tablet Take 1 tablet (20 mg total) by mouth at bedtime. 100 tablet 3  . docusate sodium (COLACE) 100 MG capsule Take 1 capsule (100 mg total) by mouth 2 (two) times daily. (Patient not taking: Reported on 12/06/2014) 30 capsule 1   No current facility-administered medications for this visit.   No Known Allergies   Review of Systems: All systems reviewed and negative except where noted in HPI.   Lab Results  Component Value Date   WBC 4.1 11/29/2014   HGB 14.2 11/29/2014   HCT 41.6 11/29/2014   MCV 87.0 11/29/2014   PLT 115.0* 11/29/2014    Lab Results  Component Value Date   ALT 19 11/29/2014   AST 17 11/29/2014   ALKPHOS 88 11/29/2014   BILITOT 1.5* 11/29/2014    Lab Results  Component Value Date   INR 1.1* 11/29/2014   INR 1.20 11/29/2013   INR 1.16 11/23/2013    Lab Results  Component Value Date   CREATININE 0.94 10/28/2014   BUN 11 10/28/2014   NA 142 06/12/2014   K 4.4 06/12/2014   CL 106 04/10/2014   CO2 25 06/12/2014    Lab Results  Component Value Date   FERRITIN 79.9 11/29/2014   Lab Results  Component Value Date   IRON 79 11/29/2014   TIBC 330 04/16/2011   FERRITIN  79.9 11/29/2014   Lab Results  Component Value Date   HAV REACTIVE* 10/28/2014   HCV AB negative  HBV surface AG negative HBV surface AB negative ANA negative SMA negative AMA negative  Imaging as outlined above  Physical Exam: BP 118/62 mmHg  Pulse 60  Ht 5' 11.75" (1.822 m)  Wt 182 lb 8 oz (82.781 kg)  BMI 24.94 kg/m2 Constitutional: Pleasant,well-developed, male in no acute distress. HEENT: Normocephalic  and atraumatic. Conjunctivae are normal. No scleral icterus. Neck supple.  Cardiovascular: Normal rate, regular rhythm.  Pulmonary/chest: Effort normal and breath sounds normal. No wheezing, rales or rhonchi. Abdominal: Soft, nondistended, nontender. Bowel sounds active throughout. There are no masses palpable. No hepatomegaly. No stigmata of chronic liver disease appreciated Extremities: no edema Lymphadenopathy: No cervical adenopathy noted. Neurological: Alert and oriented to person place and time. Skin: Skin is warm and dry. No rashes noted. Psychiatric: Normal mood and affect. Behavior is normal.   ASSESSMENT AND PLAN: 74 y/o male presenting for follow up for chronic iron deficiency anemia of unclear etiology. Most recent EGD and colonoscopy as above. EGD remarkable for esophageal varices and portal hypertensive gastropathy, concerning for underlying cirrhosis or portal hypertension. Splenomegaly noted and mild thrombocytopenia as well which would support possible underlying cirrhosis / portal hypertension. CT does not show clear evidence of cirrhosis. I suspect his anemia could be related to portal hypertensive gastropathy.   At this time given the constellation of findings as above, we need to more definitively assess for cirrhosis and underlying portal hypertension. I discussed that the gold standard to do this is with a liver biopsy and discussed risks / benefits of a liver biopsy. If he had a liver biopsy would recommend transjugular approach to assess portal  pressures. I also discussed [place] with elastography to less invasively evaluate for cirrhosis. Following a discussion of these exams he wanted to avoid liver biopsy initially if possible. We will proceed with [place] with elastography and complete labs to workup other causes of liver disease. If the elastography is normal or borderline, and labs negative, we may still need to proceed with a liver biopsy with portal pressures to more definitively evaluate for cirrhosis and clarify the underlying etiology, if present.   In the interim, recommend he avoid NSAIDs. I think a low dose aspirin is likely okay given his anemia is stable and he would warrant it for AF, but defer to cardiology. I don't think it will significantly increase his risk for variceal bleeding but it may increase it some and I counseled him on this. He will continue omeprazole BID and his nadolol otherwise. His HR is 60 today and tolerating nadolol well. He will continue his iron supplementation. He otherwise is not immune to hepatitis B and will refer him to be vaccinated. Finally, incidentally noted on CT was an enlarged prostate with bladder thickening concerning for chronic outlet obstruction - will refer to Urology regarding this finding to determine if further workup is needed.   Patient agreed with the plan, all questions answered. We will contact him with results of lab workup and [place] results to determine further need of liver biopsy.   [place] Cellar, MD [facility] Pager [phone]

## 2014-12-06 NOTE — Patient Instructions (Signed)
Your physician has requested that you go to the basement for  lab work before leaving today.    You have been scheduled for an abdominal ultrasound at [facility] (1st floor of hospital) on 12/13/2014 at 2:00pm. Please arrive 15 minutes prior to your appointment for registration. Make certain not to have anything to eat or drink 6 hours prior to your appointment. Should you need to reschedule your appointment, please contact radiology at [phone]. This test typically takes about 30 minutes to perform.

## 2014-12-07 LAB — AFP TUMOR MARKER: AFP TUMOR MARKER: 2.7 ng/mL (ref <6.1)

## 2014-12-07 LAB — IGG: IGG (IMMUNOGLOBIN G), SERUM: 1130 mg/dL (ref 650–1600)

## 2014-12-09 ENCOUNTER — Other Ambulatory Visit (HOSPITAL_BASED_OUTPATIENT_CLINIC_OR_DEPARTMENT_OTHER): Payer: Medicare Other

## 2014-12-09 ENCOUNTER — Ambulatory Visit (INDEPENDENT_AMBULATORY_CARE_PROVIDER_SITE_OTHER): Payer: Medicare Other | Admitting: Family Medicine

## 2014-12-09 VITALS — BP 120/80 | Temp 98.1°F | Wt 184.0 lb

## 2014-12-09 DIAGNOSIS — D509 Iron deficiency anemia, unspecified: Secondary | ICD-10-CM

## 2014-12-09 DIAGNOSIS — C180 Malignant neoplasm of cecum: Secondary | ICD-10-CM | POA: Diagnosis not present

## 2014-12-09 DIAGNOSIS — Z23 Encounter for immunization: Secondary | ICD-10-CM | POA: Diagnosis not present

## 2014-12-09 DIAGNOSIS — C182 Malignant neoplasm of ascending colon: Secondary | ICD-10-CM

## 2014-12-09 LAB — CBC WITH DIFFERENTIAL/PLATELET
BASO%: 0.6 % (ref 0.0–2.0)
Basophils Absolute: 0 10*3/uL (ref 0.0–0.1)
EOS ABS: 0.1 10*3/uL (ref 0.0–0.5)
EOS%: 3.6 % (ref 0.0–7.0)
HCT: 41.6 % (ref 38.4–49.9)
HGB: 14 g/dL (ref 13.0–17.1)
LYMPH%: 27.4 % (ref 14.0–49.0)
MCH: 29.4 pg (ref 27.2–33.4)
MCHC: 33.8 g/dL (ref 32.0–36.0)
MCV: 87 fL (ref 79.3–98.0)
MONO#: 0.3 10*3/uL (ref 0.1–0.9)
MONO%: 7.6 % (ref 0.0–14.0)
NEUT%: 60.8 % (ref 39.0–75.0)
NEUTROS ABS: 2.3 10*3/uL (ref 1.5–6.5)
Platelets: 104 10*3/uL — ABNORMAL LOW (ref 140–400)
RBC: 4.78 10*6/uL (ref 4.20–5.82)
RDW: 14.5 % (ref 11.0–14.6)
WBC: 3.8 10*3/uL — AB (ref 4.0–10.3)
lymph#: 1 10*3/uL (ref 0.9–3.3)

## 2014-12-09 LAB — TISSUE TRANSGLUTAMINASE, IGA: TISSUE TRANSGLUTAMINASE AB, IGA: 1 U/mL (ref <4)

## 2014-12-09 NOTE — Progress Notes (Signed)
   Subjective:    Patient ID: [name], male    DOB: 26-Jul-1940, 74 y.o.   MRN: [id number]  HPI [name] is a 74 year old married male nonsmoker who comes in today for follow-up.  He saw Dr. panic recently because of a nosebleed. She did some basic lab work which is basically normal. CBC showed hemoglobin 14 point the counter 104,000 previous platelet count 115,010 days ago. He's had a history of marginal platelet counts since his chemotherapy for colon cancer.  He also had an INR and a serum iron both of which were normal.  He tells me that he is currently being evaluated in GI for esophageal varices. They think he may have something wrong with his liver. He's not sure exactly what's going on but he scheduled for follow-up. Encouraged him to continue that dose follow-up appointments.  He's due for a physical exam in March 2017,,,,,,,,,,,, will asked him to establish with [facility] or [facility]    Review of Systems Review of systems negative    Objective:   Physical Exam Well-developed well-nourished male no acute distress vital signs stable is afebrile no further nosebleeds       Assessment & Plan:  Nosebleeds spontaneous resolved  Low platelet count not pathologic  Esophageal varices..........Marland Kitchen workup by GI ongoing

## 2014-12-09 NOTE — Patient Instructions (Signed)
Call in January to set up a physical exam and to get established for long-term care with either [facility] or [facility]

## 2014-12-10 ENCOUNTER — Telehealth: Payer: Self-pay

## 2014-12-10 ENCOUNTER — Other Ambulatory Visit: Payer: Medicare Other

## 2014-12-10 LAB — CEA: CEA: 0.7 ng/mL (ref 0.0–5.0)

## 2014-12-10 LAB — ALPHA-1-ANTITRYPSIN: A1 ANTITRYPSIN SER: 147 mg/dL (ref 83–199)

## 2014-12-10 LAB — CERULOPLASMIN: CERULOPLASMIN: 20 mg/dL (ref 18–36)

## 2014-12-10 NOTE — Telephone Encounter (Signed)
Called pt to inform him of his appointment for his second hep b injection. Left vm for pt to call back.

## 2014-12-10 NOTE — Telephone Encounter (Signed)
Pt called and was informed of his appointment on 11/28/206 for second hep injection.

## 2014-12-13 ENCOUNTER — Ambulatory Visit (HOSPITAL_COMMUNITY): Payer: Medicare Other

## 2014-12-16 ENCOUNTER — Telehealth: Payer: Self-pay

## 2014-12-16 MED ORDER — NADOLOL 40 MG PO TABS: 40.0000 mg | ORAL_TABLET | Freq: Every day | ORAL | Status: DC

## 2014-12-16 NOTE — Telephone Encounter (Signed)
-----   Message from Manus Gunning, MD sent at 12/12/2014  4:38 PM EDT ----- Regarding: RE: Nadolol refills  Yes that's fine thanks  ----- Message -----    From: Margreta Journey, CMA    Sent: 12/12/2014   2:09 PM      To: Manus Gunning, MD Subject: Nadolol refills                                Ok to refill?

## 2014-12-17 ENCOUNTER — Ambulatory Visit (HOSPITAL_BASED_OUTPATIENT_CLINIC_OR_DEPARTMENT_OTHER): Payer: Medicare Other | Admitting: Oncology

## 2014-12-17 ENCOUNTER — Telehealth: Payer: Self-pay | Admitting: Oncology

## 2014-12-17 VITALS — BP 133/55 | HR 55 | Temp 97.7°F | Resp 18 | Ht 71.0 in | Wt 178.7 lb

## 2014-12-17 DIAGNOSIS — R161 Splenomegaly, not elsewhere classified: Secondary | ICD-10-CM | POA: Diagnosis not present

## 2014-12-17 DIAGNOSIS — C180 Malignant neoplasm of cecum: Secondary | ICD-10-CM | POA: Diagnosis not present

## 2014-12-17 DIAGNOSIS — G629 Polyneuropathy, unspecified: Secondary | ICD-10-CM | POA: Diagnosis not present

## 2014-12-17 DIAGNOSIS — D696 Thrombocytopenia, unspecified: Secondary | ICD-10-CM

## 2014-12-17 DIAGNOSIS — C182 Malignant neoplasm of ascending colon: Secondary | ICD-10-CM

## 2014-12-17 DIAGNOSIS — K2270 Barrett's esophagus without dysplasia: Secondary | ICD-10-CM

## 2014-12-17 NOTE — Telephone Encounter (Signed)
per pof to sch pt appt-gave pt copy of avs °

## 2014-12-17 NOTE — Progress Notes (Signed)
[facility] OFFICE PROGRESS NOTE   Diagnosis: Colon cancer  INTERVAL HISTORY:   Mr. Mcmeen returns as scheduled. He feels well. Good appetite and energy level. No gross bleeding. He underwent an upper endoscopy by Dr. Deatra Ina on 10/28/2014. He was found to have esophageal varices and evidence of portal hypertensive gastropathy. A colonoscopy 10/28/2014 revealed diverticulosis in the descending and sigmoid colon.  He is undergoing an evaluation for cirrhosis.  He continues to have mild neuropathy symptoms in the extremities.   Objective:  Vital signs in last 24 hours:  Blood pressure 133/55, pulse 55, temperature 97.7 F (36.5 C), temperature source Oral, resp. rate 18, height _0  (1.803 m), weight 178 lb 11.2 oz (81.058 kg), SpO2 100 %.    HEENT: Neck without mass Lymphatics: No cervical, supraclavicular, axillary, or inguinal nodes Resp: Lungs clear bilaterally Cardio: Regular rate and rhythm GI: No hepatosplenomegaly, no mass, nontender Vascular: No leg edema   Lab Results:  Lab Results  Component Value Date   WBC 3.8* 12/09/2014   HGB 14.0 12/09/2014   HCT 41.6 12/09/2014   MCV 87.0 12/09/2014   PLT 104* 12/09/2014   NEUTROABS 2.3 12/09/2014     Lab Results  Component Value Date   CEA 0.7 12/09/2014     Medications: I have reviewed the patient's current medications.  Assessment/Plan: 1.Moderately differentiated adenocarcinoma of the cecum, stage IIIB (T4 N1), status post a right colectomy on 04/08/2011 -the tumor was microsatellite stable and K-ras wild-type . He began adjuvant CAPOX chemotherapy on 05/03/2011. He completed cycle 5 on 07/26/2011 (oxaliplatin was held from cycle 4). He completed cycle 7 beginning 09/06/2011. He completed cycle 8 beginning 09/27/2011.   CTs of the chest, abdomen, and pelvis 05/15/2013-negative for recurrent colon cancer  CTs of the chest, abdomen, and pelvis 06/12/2014-negative for recurrent colon  cancer 2. History of colon polyps. He underwent a surveillance colonoscopy by Dr. Deatra Ina 10/28/2014 3. History of anemia secondary to iron deficiency and chemotherapy. 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 Postoperative abdominal wall/flank hematoma.  5. Sleep apnea.  6. History of atrial fibrillation.  7. History of esophageal stricture.  8. Family history of colon cancer and gastric cancer. The tumor was microsatellite stable and no mutation was found in the mismatch repair genes. A CancerNext panel was negative.  9. 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  11. 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 Abdominal wall hernia noted on a CT of the abdomen 12/03/2011.  13. Splenomegaly-noted on CT scans back to January of 2013.  14. Stool positive for occult blood 02/12/2013. Ferritin 38 02/14/2013. Colonoscopy and upper endoscopy 03/07/2013 with no source for GI blood loss identified. Capsule endoscopy 03/20/2013 negative. 15. Colonoscopy on 03/07/2013. At the anastomosis along the suture line there were several areas of prominent mucosa. Biopsies were taken. Moderate diverticulosis was noted in the sigmoid colon. Internal hemorrhoids also noted. No definite source for GI blood loss was identified. Biopsy of the anastomosis site showed benign colonic mucosa with inflamed granulation tissue. No adenomatous change or malignancy. He subsequently underwent an upper endoscopy (03/07/2013) which was normal.  16. Review of peripheral blood smear 01/30/2013-few metamyelocytes and myelocytes, mild decrease in platelets, normal red blood cells.  65. Right distal femur fracture October 2015 status post surgery.    Disposition:  Mr. Dittmar remains in clinical remission  from colon cancer. He will continue surveillance colonoscopies with Dr. Havery Moros.  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 He likely has underlying cirrhosis. It is possible the cirrhosis was caused by adjuvant chemotherapy, but the splenomegaly was present prior to beginning chemotherapy.  He will return for an office visit and CEA in 6 months. He will continue an evaluation for cirrhosis with Dr. Havery Moros.  Betsy Coder, MD  12/17/2014  10:40 AM

## 2014-12-25 ENCOUNTER — Ambulatory Visit (HOSPITAL_COMMUNITY)
Admission: RE | Admit: 2014-12-25 | Discharge: 2014-12-25 | Disposition: A | Discharge status: Home / Self Care | Payer: Medicare Other | Source: Ambulatory Visit | Attending: Gastroenterology | Admitting: Gastroenterology

## 2014-12-25 DIAGNOSIS — I8500 Esophageal varices without bleeding: Secondary | ICD-10-CM | POA: Diagnosis present

## 2014-12-25 DIAGNOSIS — K8020 Calculus of gallbladder without cholecystitis without obstruction: Secondary | ICD-10-CM | POA: Insufficient documentation

## 2014-12-25 DIAGNOSIS — K766 Portal hypertension: Secondary | ICD-10-CM | POA: Insufficient documentation

## 2014-12-25 DIAGNOSIS — K7689 Other specified diseases of liver: Secondary | ICD-10-CM | POA: Diagnosis not present

## 2014-12-25 DIAGNOSIS — R161 Splenomegaly, not elsewhere classified: Secondary | ICD-10-CM | POA: Diagnosis not present

## 2014-12-25 DIAGNOSIS — D649 Anemia, unspecified: Secondary | ICD-10-CM | POA: Diagnosis not present

## 2014-12-25 DIAGNOSIS — K3189 Other diseases of stomach and duodenum: Secondary | ICD-10-CM

## 2014-12-27 ENCOUNTER — Telehealth: Payer: Self-pay | Admitting: Gastroenterology

## 2014-12-27 DIAGNOSIS — K7400 Hepatic fibrosis, unspecified: Secondary | ICD-10-CM

## 2014-12-27 NOTE — Telephone Encounter (Signed)
Called patient and reviewed [place] with elastography. He has thrombocytopenia, splenomegally, and a cirrhotic appearing liver on [place]. Elastography scores show high risk of fibrosis / cirrhosis. His labs for chronic liver disease are negative and he denies an alcohol history. Unclear what has caused him to have cirrhosis, as he otherwise doesn't have risk factors for fatty liver. His prior chemotherapy from Oxaliplatin is possible.   I discussed options with him at this point. I do think he has cirrhosis and will be managed and surveyed as such. Recommend [place] with AFP every 6 months and continue his betablocker. He has no decompensations. Otherwise, to clarify why he has cirrhosis we discussed the utility and risks of a liver biopsy. Following our discussion he wished to proceed with a liver biopsy in hopes of possibly clarifying an etiology.   Rollene Fare, can you assist in coordinating a liver biopsy for [name] with IR? Thanks much

## 2014-12-27 NOTE — Telephone Encounter (Signed)
Patient is calling because he spoke with MD yesterday and was told to call about a test. Please, advise.

## 2014-12-27 NOTE — Telephone Encounter (Signed)
Order placed in EPIC for biopsy. Radiology will call patient with appointment. Patient aware.

## 2015-01-09 ENCOUNTER — Other Ambulatory Visit: Payer: Self-pay | Admitting: Radiology

## 2015-01-09 ENCOUNTER — Ambulatory Visit (INDEPENDENT_AMBULATORY_CARE_PROVIDER_SITE_OTHER): Payer: Medicare Other | Admitting: Gastroenterology

## 2015-01-09 DIAGNOSIS — Z23 Encounter for immunization: Secondary | ICD-10-CM | POA: Diagnosis not present

## 2015-01-10 ENCOUNTER — Ambulatory Visit (HOSPITAL_COMMUNITY)
Admission: RE | Admit: 2015-01-10 | Discharge: 2015-01-10 | Disposition: A | Discharge status: Home / Self Care | Payer: Medicare Other | Source: Ambulatory Visit | Attending: Gastroenterology | Admitting: Gastroenterology

## 2015-01-10 ENCOUNTER — Encounter (HOSPITAL_COMMUNITY): Payer: Self-pay

## 2015-01-10 DIAGNOSIS — Z87891 Personal history of nicotine dependence: Secondary | ICD-10-CM | POA: Insufficient documentation

## 2015-01-10 DIAGNOSIS — G4733 Obstructive sleep apnea (adult) (pediatric): Secondary | ICD-10-CM | POA: Insufficient documentation

## 2015-01-10 DIAGNOSIS — Z85038 Personal history of other malignant neoplasm of large intestine: Secondary | ICD-10-CM | POA: Diagnosis not present

## 2015-01-10 DIAGNOSIS — K219 Gastro-esophageal reflux disease without esophagitis: Secondary | ICD-10-CM | POA: Insufficient documentation

## 2015-01-10 DIAGNOSIS — Z8249 Family history of ischemic heart disease and other diseases of the circulatory system: Secondary | ICD-10-CM | POA: Insufficient documentation

## 2015-01-10 DIAGNOSIS — Z800 Family history of malignant neoplasm of digestive organs: Secondary | ICD-10-CM | POA: Insufficient documentation

## 2015-01-10 DIAGNOSIS — Z9049 Acquired absence of other specified parts of digestive tract: Secondary | ICD-10-CM | POA: Diagnosis not present

## 2015-01-10 DIAGNOSIS — Z79899 Other long term (current) drug therapy: Secondary | ICD-10-CM | POA: Diagnosis not present

## 2015-01-10 DIAGNOSIS — E7800 Pure hypercholesterolemia, unspecified: Secondary | ICD-10-CM | POA: Diagnosis not present

## 2015-01-10 DIAGNOSIS — K7460 Unspecified cirrhosis of liver: Secondary | ICD-10-CM | POA: Diagnosis not present

## 2015-01-10 DIAGNOSIS — K7400 Hepatic fibrosis, unspecified: Secondary | ICD-10-CM | POA: Insufficient documentation

## 2015-01-10 DIAGNOSIS — I4891 Unspecified atrial fibrillation: Secondary | ICD-10-CM | POA: Diagnosis not present

## 2015-01-10 HISTORY — DX: Esophageal varices without bleeding: I85.00

## 2015-01-10 HISTORY — DX: Polyneuropathy, unspecified: G62.9

## 2015-01-10 LAB — CBC
HEMATOCRIT: 40.9 % (ref 39.0–52.0)
HEMOGLOBIN: 14.4 g/dL (ref 13.0–17.0)
MCH: 30.3 pg (ref 26.0–34.0)
MCHC: 35.2 g/dL (ref 30.0–36.0)
MCV: 85.9 fL (ref 78.0–100.0)
Platelets: 104 10*3/uL — ABNORMAL LOW (ref 150–400)
RBC: 4.76 MIL/uL (ref 4.22–5.81)
RDW: 14.1 % (ref 11.5–15.5)
WBC: 4.4 10*3/uL (ref 4.0–10.5)

## 2015-01-10 LAB — PROTIME-INR
INR: 1.16 (ref 0.00–1.49)
Prothrombin Time: 15 seconds (ref 11.6–15.2)

## 2015-01-10 LAB — APTT: aPTT: 36 seconds (ref 24–37)

## 2015-01-10 MED ORDER — FLUMAZENIL 0.5 MG/5ML IV SOLN
INTRAVENOUS | Status: AC
  Filled 2015-01-10: qty 5

## 2015-01-10 MED ORDER — SODIUM CHLORIDE 0.9 % IV SOLN
INTRAVENOUS | Status: DC
  Administered 2015-01-10: 12:00:00 via INTRAVENOUS

## 2015-01-10 MED ORDER — FENTANYL CITRATE (PF) 100 MCG/2ML IJ SOLN
INTRAMUSCULAR | Status: AC | PRN
  Administered 2015-01-10 (×2): 25 ug via INTRAVENOUS

## 2015-01-10 MED ORDER — MIDAZOLAM HCL 2 MG/2ML IJ SOLN
INTRAMUSCULAR | Status: AC
  Filled 2015-01-10: qty 4

## 2015-01-10 MED ORDER — MIDAZOLAM HCL 2 MG/2ML IJ SOLN
INTRAMUSCULAR | Status: AC | PRN
  Administered 2015-01-10: 0.5 mg via INTRAVENOUS
  Administered 2015-01-10: 1 mg via INTRAVENOUS
  Administered 2015-01-10: 0.5 mg via INTRAVENOUS

## 2015-01-10 MED ORDER — FENTANYL CITRATE (PF) 100 MCG/2ML IJ SOLN
INTRAMUSCULAR | Status: DC
Start: 2015-01-10 — End: 2015-01-11
  Filled 2015-01-10: qty 2

## 2015-01-10 MED ORDER — NALOXONE HCL 0.4 MG/ML IJ SOLN
INTRAMUSCULAR | Status: AC
  Filled 2015-01-10: qty 1

## 2015-01-10 NOTE — Discharge Instructions (Signed)

## 2015-01-10 NOTE — Procedures (Signed)
Random Liver Biopsy 18 g times three No comp/EBL

## 2015-01-10 NOTE — H&P (Signed)
Chief Complaint: Patient was seen in consultation today for random liver biopsy at the request of Manus Gunning  Referring Physician(s): Manus Gunning  History of Present Illness: [name] is a 74 y.o. male with recent findings suggestive of hepatic cirrhosis. He is referred for random liver core biopsy PMHx, meds, labs, imaging reviewed. Has been NPO this am.     Past Medical History  Diagnosis Date  . Diverticulosis     Sigmoid colon  . Personal history of colonic polyps 09/17/2009    Tubular adenoma  . History of hemorrhoids   . Hyperlipidemia   . Anemia   . GERD (gastroesophageal reflux disease) 03-31-11    tx. Omeprazole  . Atrial fibrillation [facility]) 2006    Dr Tonny Bollman af-no meds  . Hypercholesterolemia   . Hernia, abdominal     at the umbilicus  . colon ca dx'd 02/2011  . Pneumonia 01/2013  . OSA on CPAP dx'd 2010    uses cap nightly  . Arthritis     "thumbs" (11/29/2013)  . TBI (traumatic brain injury) ([place]) 1986    S/P fall; "slow to get going q morning" (10//22/2015)  . Fracture of distal femur ([place]) 11/29/2013  . History of irregular heartbeat   . Hearing loss     has bilateral hearing aids  . 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 Laparoscopic partial colectomy  04/08/2011  . Colon surgery  2013  . Cardiac catheterization  07/2002    Dr Wynonia Lawman  . Portacath placement  04/30/2011    Procedure: INSERTION PORT-A-CATH;  Surgeon: Odis Hollingshead, MD;  Location: [place];  Service: General;  Laterality: Right;  . Port-a-cath removal  11/09/2011    Procedure: REMOVAL PORT-A-CATH;  Surgeon: Odis Hollingshead, MD;  Location: [place];  Service: General;  Laterality: N/A;  . Appendectomy  03/2011  . Orif distal femur fracture Right 11/29/2013    "put plate in"  . Inguinal hernia repair Left ~  1991  . Orif femur fracture Right 11/29/2013    Procedure: OPEN REDUCTION INTERNAL FIXATION (ORIF) RIGHT  DISTAL FEMUR FRACTURE;  Surgeon: Rozanna Box, MD;  Location: [place];  Service: Orthopedics;  Laterality: Right;  . Colonoscopy  sept 2016    upper endo and colonoscopy  . Esophageal dilation  2006    Allergies: Review of patient's allergies indicates no known allergies.  Medications: Prior to Admission medications   Medication Sig Start Date End Date Taking? Authorizing Provider  amphetamine-dextroamphetamine (ADDERALL) 10 MG tablet Take 30 mg by mouth every morning.  12/30/10  Yes Historical Provider, MD  omeprazole (PRILOSEC) 10 MG capsule TAKE 1 BY MOUTH TWICE DAILY 04/25/14  Yes Dorena Cookey, MD  sildenafil (VIAGRA) 100 MG tablet Take 1 tablet (100 mg total) by mouth daily as needed for erectile dysfunction. 04/16/14  Yes Dorena Cookey, MD  simvastatin (ZOCOR) 20 MG tablet Take 1 tablet (20 mg total) by mouth at bedtime. 04/16/14  Yes Dorena Cookey, MD  b complex vitamins tablet Take 1 tablet by mouth every evening.     Historical Provider, MD  docusate sodium (COLACE) 100 MG capsule Take 1 capsule (100 mg total) by mouth 2 (two) times daily. Patient not taking: Reported on 12/06/2014 11/30/13   Ainsley Spinner, PA-C  ferrous sulfate 325 (65  FE) MG tablet Take 325 mg by mouth 2 (two) times daily.    Historical Provider, MD  gabapentin (NEURONTIN) 100 MG capsule One by mouth twice a day 04/16/14   Dorena Cookey, MD  Multiple Vitamin (MULTIVITAMIN WITH MINERALS) TABS tablet Take 1 tablet by mouth every evening.     Historical Provider, MD  nadolol (CORGARD) 40 MG tablet Take 1 tablet (40 mg total) by mouth daily. 12/16/14   Manus Gunning, MD     Family History  Problem Relation Age of Onset  . Diabetes Father   . Prostate cancer Father   . Heart attack Father 71  . Heart failure Mother     Congestive heart failure  . Cancer Brother     colon  . Colon cancer Brother 16    . Stomach cancer Sister   . Coronary artery disease Brother     CABG  . Anesthesia problems Neg Hx   . Esophageal cancer Neg Hx   . Colon cancer Other   . Colon cancer Sister     Social History   Social History  . Marital Status: Married    Spouse Name: N/A  . Number of Children: 2  . Years of Education: N/A   Occupational History  . Retired    Social History Main Topics  . Smoking status: Former Smoker -- 0.50 packs/day for 7 years    Types: Cigarettes    Quit date: 03/21/1961  . Smokeless tobacco: Never Used  . Alcohol Use: No  . Drug Use: No  . Sexual Activity: Yes   Other Topics Concern  . None   Social History Narrative    Review of Systems: A 12 point ROS discussed and pertinent positives are indicated in the HPI above.  All other systems are negative.  Review of Systems  Vital Signs: BP 124/57 mmHg  Pulse 61  Temp(Src) 97.7 F (36.5 C) (Oral)  Resp 16  SpO2 100%  Physical Exam  Constitutional: He is oriented to person, place, and time. He appears well-developed and well-nourished. No distress.  HENT:  Head: Normocephalic.  Mouth/Throat: Oropharynx is clear and moist.  Neck: Normal range of motion. No tracheal deviation present.  Cardiovascular: Normal rate, regular rhythm and normal heart sounds.   Pulmonary/Chest: Effort normal and breath sounds normal. No respiratory distress.  Abdominal: Soft. Bowel sounds are normal. He exhibits no mass. There is no tenderness.  Neurological: He is alert and oriented to person, place, and time.  Psychiatric: He has a normal mood and affect. Judgment normal.    Mallampati Score:  MD Evaluation Airway: WNL Heart: WNL Abdomen: WNL Chest/ Lungs: WNL ASA  Classification: 2 Mallampati/Airway Score: One  Imaging: US Abdomen Complete W/elastography  12/25/2014  CLINICAL DATA:  Esophageal varices, portal hypertension, anemia, splenomegaly EXAM: ULTRASOUND ABDOMEN COMPLETE ULTRASOUND HEPATIC ELASTOGRAPHY  TECHNIQUE: Sonography of the upper abdomen was performed. In addition, ultrasound elastography evaluation of the liver was performed. A region of interest was placed within the right lobe of the liver. Following application of a compressive sonographic pulse, shear waves were detected in the adjacent hepatic tissue and the shear wave velocity was calculated. Multiple assessments were performed at the selected site. Median shear wave velocity is correlated to a Metavir fibrosis score. COMPARISON:  CT abdomen pelvis dated 11/01/2014 FINDINGS: ULTRASOUND ABDOMEN Gallbladder: Layering gallstones, measuring up to 8 mm. No gallbladder wall thickening or pericholecystic fluid. Negative sonographic Murphy's sign. Common bile duct: Diameter: 3 mm Liver: Multiple hepatic  cysts, measuring up to 5.0 x 3.5 x 4.4 cm. Coarse hepatic echotexture. No focal hepatic lesion is seen. IVC: Poorly visualized. Pancreas: Visualized portion unremarkable. Spleen: Enlarged, measuring 13.4 x 6.5 x 19.4 cm (calculated volume 890 mL). Right Kidney: Length: 10.8 cm. 1.5 x 1.7 x 1.3 cm upper pole renal cyst, simple. No hydronephrosis. Left Kidney: Length: 1.8 cm. No mass or hydronephrosis. Abdominal aorta: No aneurysm visualized. Other findings: None. ULTRASOUND HEPATIC ELASTOGRAPHY Device: Siemens Helix VTQ Transducer 6C1 Patient position: Left lateral decubitus Number of measurements:  10 Hepatic Segment:  8 Median velocity:   2.93  m/sec IQR: 0.50 IQR/Median velocity ratio 0.17 Corresponding Metavir fibrosis score:  F3/F4 Risk of fibrosis: High Limitations of exam: None Pertinent findings noted on other imaging exams:  None Please note that abnormal shear wave velocities may also be identified in clinical settings other than with hepatic fibrosis, such as: acute hepatitis, elevated right heart and central venous pressures including use of beta blockers, veno-occlusive disease (Budd-Chiari), infiltrative processes such as  mastocytosis/amyloidosis/infiltrative tumor, extrahepatic cholestasis, in the post-prandial state, and liver transplantation. Correlation with patient history, laboratory data, and clinical condition recommended. IMPRESSION: Multiple hepatic cysts, measuring up to 5.0 cm. Cholelithiasis, without associated sonographic findings to suggest acute cholecystitis. Splenomegaly (calculated volume 890 mL). Median hepatic shear wave velocity is calculated at 2.93 m/sec. Corresponding Metavir fibrosis score is F3/F4. Risk of fibrosis is high. Follow-up:  Follow-up advised. Electronically Signed   By: Julian Hy M.D.   On: 12/25/2014 09:22    Labs:  CBC:  Recent Labs  06/12/14 1002 11/29/14 1025 12/09/14 1155 01/10/15 1110  WBC 3.4* 4.1 3.8* 4.4  HGB 14.6 14.2 14.0 14.4  HCT 40.7 41.6 41.6 40.9  PLT 113* 115.0* 104* 104*    COAGS:  Recent Labs  11/29/14 1025 01/10/15 1110  INR 1.1* 1.16  APTT  --  36    BMP:  Recent Labs  04/10/14 0825 06/12/14 1003 10/28/14 1550  NA 141 142  --   K 4.5 4.4  --   CL 106  --   --   CO2 29 25  --   GLUCOSE 102* 133  --   BUN 12 11.6 11  CALCIUM 9.7 9.4  --   CREATININE 0.99 1.0 0.94    LIVER FUNCTION TESTS:  Recent Labs  04/10/14 0825 06/12/14 1003 11/29/14 1025  BILITOT 1.7* 0.78 1.5*  AST 18 15 17   ALT 20 21 19   ALKPHOS 85 131 88  PROT 7.1 7.4 6.9  ALBUMIN 4.3 4.0 4.2    TUMOR MARKERS:  Recent Labs  06/12/14 1003 12/06/14 1157 12/09/14 1155  AFPTM  --  2.7  --   CEA <0.5  --  0.7    Assessment and Plan: Hepatic cirrhosis For US guided random liver core biopsy Labs reviewed, ok Risks and Benefits discussed with the patient including, but not limited to bleeding, infection, damage to adjacent structures or low yield requiring additional tests. All of the patient's questions were answered, patient is agreeable to proceed. Consent signed and in chart.    Thank you for this interesting consult. .  A copy of  this report was sent to the requesting provider on this date.  SignedAscencion Dike 01/10/2015, 12:36 PM   I spent a total of 20 minutes in face to face in clinical consultation, greater than 50% of which was counseling/coordinating care for liver core biopsy

## 2015-01-15 ENCOUNTER — Other Ambulatory Visit: Payer: Self-pay | Admitting: *Deleted

## 2015-01-16 ENCOUNTER — Telehealth: Payer: Self-pay | Admitting: Gastroenterology

## 2015-01-16 NOTE — Telephone Encounter (Signed)
Spoke with Sharl Ma and she states the two new hepatologist atre Dr. Gertie Fey and [place]. She has scheduled the patient with Dr. Gertie Fey on 02/28/15 at 11:30 AM. She will let patient know this.

## 2015-01-17 ENCOUNTER — Telehealth: Payer: Self-pay | Admitting: Gastroenterology

## 2015-01-17 NOTE — Telephone Encounter (Signed)
Spoke with patient and answered his questions. He has thought about it and would rather go the [facility] clinic. He could go to the [facility] clinic if needed or to [facility]. Please, advise if this is ok.

## 2015-01-18 NOTE — Telephone Encounter (Signed)
That's fine, where ever his preference is okay with me, as long as he sees a hepatologist. I will otherwise await echocardiogram. Thanks

## 2015-01-20 NOTE — Telephone Encounter (Signed)
Records faxed to [facility] liver.Left a message for patient to referral to [facility] is being made.

## 2015-01-22 ENCOUNTER — Other Ambulatory Visit: Payer: Self-pay | Admitting: Gastroenterology

## 2015-01-22 NOTE — Telephone Encounter (Signed)
Spoke with [facility]  Liver clinic scheduling and records have been sent for review. Left a message for patient with this information.

## 2015-01-27 ENCOUNTER — Encounter: Payer: Self-pay | Admitting: Cardiology

## 2015-02-12 ENCOUNTER — Telehealth: Payer: Self-pay | Admitting: *Deleted

## 2015-02-12 NOTE — Telephone Encounter (Signed)
-----   Message from Hulan Saas, RN sent at 01/22/2015  9:50 AM EST ----- Did patient get appt at [facility] liver? SA

## 2015-02-12 NOTE — Telephone Encounter (Signed)
Spoke with [facility] liver clinic and  Patient is scheduled with Dr. Patsy Baltimore on 02/19/15 at 10:00 AM for OV.

## 2015-03-23 ENCOUNTER — Other Ambulatory Visit: Payer: Self-pay | Admitting: Gastroenterology

## 2015-03-24 ENCOUNTER — Telehealth: Payer: Self-pay | Admitting: Gastroenterology

## 2015-03-24 NOTE — Telephone Encounter (Signed)
Records reviewed from [facility]. He was seen for a second opinion by hepatology for noncirrhotic portal hypertension. 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 He has no evidence of cirrhosis. Rollene Fare can you please book him a follow up appointment with me in a few months for reassessment? Thanks

## 2015-03-25 NOTE — Telephone Encounter (Signed)
Is this ok to refill?  

## 2015-03-25 NOTE — Telephone Encounter (Signed)
Yes please refill and I would like to see him in clinic for a follow up if you can help coordinate the visit. Thanks

## 2015-04-02 ENCOUNTER — Encounter: Payer: Self-pay | Admitting: Gastroenterology

## 2015-04-08 NOTE — Telephone Encounter (Signed)
Rollene Fare can follow up from this patient by any chance?

## 2015-04-08 NOTE — Telephone Encounter (Signed)
Left a message for patient to call us to schedule f/u OV.

## 2015-04-09 NOTE — Telephone Encounter (Signed)
Spoke with patient and scheduled OV on 05/13/15 at 1:45 PM.

## 2015-04-24 DIAGNOSIS — K766 Portal hypertension: Secondary | ICD-10-CM | POA: Diagnosis not present

## 2015-05-06 ENCOUNTER — Ambulatory Visit (INDEPENDENT_AMBULATORY_CARE_PROVIDER_SITE_OTHER): Payer: Medicare Other | Admitting: Family Medicine

## 2015-05-06 ENCOUNTER — Encounter: Payer: Self-pay | Admitting: Family Medicine

## 2015-05-06 VITALS — BP 137/68 | HR 69 | Temp 98.0°F | Resp 20 | Wt 182.5 lb

## 2015-05-06 DIAGNOSIS — J0100 Acute maxillary sinusitis, unspecified: Secondary | ICD-10-CM | POA: Diagnosis not present

## 2015-05-06 MED ORDER — AZITHROMYCIN 250 MG PO TABS: ORAL_TABLET | ORAL | Status: DC

## 2015-05-06 MED ORDER — DOXYCYCLINE HYCLATE 100 MG PO TABS: 100.0000 mg | ORAL_TABLET | Freq: Two times a day (BID) | ORAL | Status: DC

## 2015-05-06 NOTE — Patient Instructions (Addendum)
You can use Mucinex (plain).  Nasal saline. Humidifier use.  Nasal saline.  Doxycyline every 12 hours for 7 days.   Sinusitis, Adult Sinusitis is redness, soreness, and inflammation of the paranasal sinuses. Paranasal sinuses are air pockets within the bones of your face. They are located beneath your eyes, in the middle of your forehead, and above your eyes. In healthy paranasal sinuses, mucus is able to drain out, and air is able to circulate through them by way of your nose. However, when your paranasal sinuses are inflamed, mucus and air can become trapped. This can allow bacteria and other germs to grow and cause infection. Sinusitis can develop quickly and last only a short time (acute) or continue over a long period (chronic). Sinusitis that lasts for more than 12 weeks is considered chronic. CAUSES Causes of sinusitis include:  Allergies.  Structural abnormalities, such as displacement of the cartilage that separates your nostrils (deviated septum), which can decrease the air flow through your nose and sinuses and affect sinus drainage.  Functional abnormalities, such as when the small hairs (cilia) that line your sinuses and help remove mucus do not work properly or are not present. SIGNS AND SYMPTOMS Symptoms of acute and chronic sinusitis are the same. The primary symptoms are pain and pressure around the affected sinuses. Other symptoms include:  Upper toothache.  Earache.  Headache.  Bad breath.  Decreased sense of smell and taste.  A cough, which worsens when you are lying flat.  Fatigue.  Fever.  Thick drainage from your nose, which often is green and may contain pus (purulent).  Swelling and warmth over the affected sinuses. DIAGNOSIS Your health care provider will perform a physical exam. During your exam, your health care provider may perform any of the following to help determine if you have acute sinusitis or chronic sinusitis:  Look in your nose for signs  of abnormal growths in your nostrils (nasal polyps).  Tap over the affected sinus to check for signs of infection.  View the inside of your sinuses using an imaging device that has a light attached (endoscope). If your health care provider suspects that you have chronic sinusitis, one or more of the following tests may be recommended:  Allergy tests.  Nasal culture. A sample of mucus is taken from your nose, sent to a lab, and screened for bacteria.  Nasal cytology. A sample of mucus is taken from your nose and examined by your health care provider to determine if your sinusitis is related to an allergy. TREATMENT Most cases of acute sinusitis are related to a viral infection and will resolve on their own within 10 days. Sometimes, medicines are prescribed to help relieve symptoms of both acute and chronic sinusitis. These may include pain medicines, decongestants, nasal steroid sprays, or saline sprays. However, for sinusitis related to a bacterial infection, your health care provider will prescribe antibiotic medicines. These are medicines that will help kill the bacteria causing the infection. Rarely, sinusitis is caused by a fungal infection. In these cases, your health care provider will prescribe antifungal medicine. For some cases of chronic sinusitis, surgery is needed. Generally, these are cases in which sinusitis recurs more than 3 times per year, despite other treatments. HOME CARE INSTRUCTIONS  Drink plenty of water. Water helps thin the mucus so your sinuses can drain more easily.  Use a humidifier.  Inhale steam 3-4 times a day (for example, sit in the bathroom with the shower running).  Apply a warm, moist  washcloth to your face 3-4 times a day, or as directed by your health care provider.  Use saline nasal sprays to help moisten and clean your sinuses.  Take medicines only as directed by your health care provider.  If you were prescribed either an antibiotic or  antifungal medicine, finish it all even if you start to feel better. SEEK IMMEDIATE MEDICAL CARE IF:  You have increasing pain or severe headaches.  You have nausea, vomiting, or drowsiness.  You have swelling around your face.  You have vision problems.  You have a stiff neck.  You have difficulty breathing.   This information is not intended to replace advice given to you by your health care provider. Make sure you discuss any questions you have with your health care provider.   Document Released: 01/25/2005 Document Revised: 02/15/2014 Document Reviewed: 02/09/2011 Elsevier Interactive Patient Education [employer].

## 2015-05-06 NOTE — Progress Notes (Signed)
Patient ID: [name], male   DOB: Jan 12, 1941, 76 y.o.   MRN: [id number]    [name] , [birth year], 75 y.o., male MRN: [id number]  CC: sinus pressure Subjective: Pt presents for an acute OV with complaints of sinus pressure of 5 days duration. Associated symptoms include ear pressure, low grade fever, nose bleeding, fatigue, cough turned to productive today. Pt feels symptoms are worse in the morning. Pt has tried nothing to ease his symptoms. He reports he uses CPAP humidifier, but his nose is still trying out. He reports the right side of his nose bleeding, "pretty significantly "yesterday, patient has not had a bloody nose today. He has a history of esophageal varices, portal hypertension and fibrosis of the liver. By EMR record, kidney function and liver function are within normal limits.   No Known Allergies Social History  Substance Use Topics  . Smoking status: Former Smoker -- 0.50 packs/day for 7 years    Types: Cigarettes    Quit date: 03/21/1961  . Smokeless tobacco: Never Used  . Alcohol Use: No   Past Medical History  Diagnosis Date  . Diverticulosis     Sigmoid colon  . Personal history of colonic polyps 09/17/2009    Tubular adenoma  . History of hemorrhoids   . Hyperlipidemia   . Anemia   . GERD (gastroesophageal reflux disease) 03-31-11    tx. Omeprazole  . Atrial fibrillation [facility]) 2006    Dr Tonny Bollman af-no meds  . Hypercholesterolemia   . Hernia, abdominal     at the umbilicus  . colon ca dx'd 02/2011  . Pneumonia 01/2013  . OSA on CPAP dx'd 2010    uses cap nightly  . Arthritis     "thumbs" (11/29/2013)  . TBI (traumatic brain injury) ([place]) 1986    S/P fall; "slow to get going q morning" (10//22/2015)  . Fracture of distal femur ([place]) 11/29/2013  . History of irregular heartbeat   . Hearing loss     has bilateral hearing aids  . 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 Laparoscopic partial colectomy  04/08/2011  . Colon surgery  2013  . Cardiac catheterization  07/2002    Dr Wynonia Lawman  . Portacath placement  04/30/2011    Procedure: INSERTION PORT-A-CATH;  Surgeon: Odis Hollingshead, MD;  Location: [place];  Service: General;  Laterality: Right;  . Port-a-cath removal  11/09/2011    Procedure: REMOVAL PORT-A-CATH;  Surgeon: Odis Hollingshead, MD;  Location: [place];  Service: General;  Laterality: N/A;  . Appendectomy  03/2011  . Orif distal femur fracture Right 11/29/2013    "put plate in"  . Inguinal hernia repair Left ~ 1991  . Orif femur fracture Right 11/29/2013    Procedure: OPEN REDUCTION INTERNAL FIXATION (ORIF) RIGHT  DISTAL FEMUR FRACTURE;  Surgeon: Rozanna Box, MD;  Location: [place];  Service: Orthopedics;  Laterality: Right;  . Colonoscopy  sept 2016    upper endo and colonoscopy  . Esophageal dilation  2006   Family History  Problem Relation Age of Onset  . Diabetes Father   . Prostate cancer Father   . Heart attack Father 5  . Heart failure Mother     Congestive heart failure  . Cancer Brother     colon  . Colon cancer Brother 75  .  Stomach cancer Sister   . Coronary artery disease Brother     CABG  . Anesthesia problems Neg Hx   . Esophageal cancer Neg Hx   . Colon cancer Other   . Colon cancer Sister      Medication List       This list is accurate as of: 05/06/15  1:47 PM.  Always use your most recent med list.               amphetamine-dextroamphetamine 10 MG tablet  Commonly known as:  ADDERALL  Take 30 mg by mouth every morning.     b complex vitamins tablet  Take 1 tablet by mouth every evening. Reported on 05/06/2015     docusate sodium 100 MG capsule  Commonly known as:  COLACE  Take 1 capsule (100 mg total) by mouth 2 (two) times daily.     ferrous sulfate 325 (65 FE) MG tablet  Take 325 mg by mouth 2 (two) times daily.      gabapentin 100 MG capsule  Commonly known as:  NEURONTIN  One by mouth twice a day     multivitamin with minerals Tabs tablet  Take 1 tablet by mouth every evening. Reported on 05/06/2015     nadolol 40 MG tablet  Commonly known as:  CORGARD  TAKE 1 TABLET BY MOUTH EVERY DAY     omeprazole 10 MG capsule  Commonly known as:  PRILOSEC  TAKE 1 BY MOUTH TWICE DAILY     sildenafil 100 MG tablet  Commonly known as:  VIAGRA  Take 1 tablet (100 mg total) by mouth daily as needed for erectile dysfunction.     simvastatin 20 MG tablet  Commonly known as:  ZOCOR  Take 1 tablet (20 mg total) by mouth at bedtime.        ROS: Negative, with the exception of above mentioned in HPI  Objective:  BP 137/68 mmHg  Pulse 69  Temp(Src) 98 F (36.7 C)  Resp 20  Wt 182 lb 8 oz (82.781 kg)  SpO2 96% Body mass index is 25.46 kg/(m^2). Gen: Afebrile. No acute distress. Nontoxic in appearance, well-developed, well-nourished, Caucasian male. Very pleasant. HENT: AT. [place]. Bilateral TM visualized, with air-fluid levels bilaterally. No erythema. MMM, no oral lesions. Bilateral nares with erythema, swelling and dried blood. Throat without erythema or exudates. Mild postnasal drip present. No cough on exam. No hoarseness on exam. Tender to palpation maxillary sinus. Eyes:Pupils Equal Round Reactive to light, Extraocular movements intact,  Conjunctiva without redness, discharge or icterus. Neck/lymp/endocrine: Supple, no lymphadenopathy CV: RRR , no edema Chest: CTAB, no wheeze or crackles. Good air movement, normal resp effort.  Abd: Soft.  NTND. BS present  Neuro: Normal gait. PERLA. EOMi. Alert. Oriented x3   Assessment/Plan: [name] is a 75 y.o. male present for acute OV for  1. Acute maxillary sinusitis, recurrence not specified - Patient encouraged to rest, hydrate, nasal saline, humidifier in his room, Mucinex (plain) - doxycycline (VIBRA-TABS) 100 MG tablet; Take 1 tablet (100 mg total)  by mouth 2 (two) times daily.  Dispense: 14 tablet; Refill: 0 - F/U PRN  electronically signed by:  Howard Pouch, DO  [place]

## 2015-05-09 ENCOUNTER — Encounter: Payer: Self-pay | Admitting: *Deleted

## 2015-05-13 ENCOUNTER — Ambulatory Visit (INDEPENDENT_AMBULATORY_CARE_PROVIDER_SITE_OTHER): Payer: Medicare Other | Admitting: Gastroenterology

## 2015-05-13 ENCOUNTER — Encounter: Payer: Self-pay | Admitting: Gastroenterology

## 2015-05-13 ENCOUNTER — Other Ambulatory Visit (INDEPENDENT_AMBULATORY_CARE_PROVIDER_SITE_OTHER): Payer: Medicare Other

## 2015-05-13 VITALS — BP 124/72 | HR 52 | Ht 71.75 in | Wt 185.4 lb

## 2015-05-13 DIAGNOSIS — Z85038 Personal history of other malignant neoplasm of large intestine: Secondary | ICD-10-CM

## 2015-05-13 DIAGNOSIS — D509 Iron deficiency anemia, unspecified: Secondary | ICD-10-CM | POA: Diagnosis not present

## 2015-05-13 DIAGNOSIS — K766 Portal hypertension: Secondary | ICD-10-CM

## 2015-05-13 LAB — CBC WITH DIFFERENTIAL/PLATELET
BASOS PCT: 0.2 % (ref 0.0–3.0)
Basophils Absolute: 0 10*3/uL (ref 0.0–0.1)
EOS ABS: 0.1 10*3/uL (ref 0.0–0.7)
EOS PCT: 2.4 % (ref 0.0–5.0)
HCT: 38.7 % — ABNORMAL LOW (ref 39.0–52.0)
Hemoglobin: 13.3 g/dL (ref 13.0–17.0)
Lymphocytes Relative: 28 % (ref 12.0–46.0)
Lymphs Abs: 1.3 10*3/uL (ref 0.7–4.0)
MCHC: 34.5 g/dL (ref 30.0–36.0)
MCV: 86.2 fl (ref 78.0–100.0)
MONO ABS: 0.4 10*3/uL (ref 0.1–1.0)
Monocytes Relative: 8 % (ref 3.0–12.0)
NEUTROS ABS: 2.9 10*3/uL (ref 1.4–7.7)
Neutrophils Relative %: 61.4 % (ref 43.0–77.0)
PLATELETS: 123 10*3/uL — AB (ref 150.0–400.0)
RBC: 4.49 Mil/uL (ref 4.22–5.81)
RDW: 14.5 % (ref 11.5–15.5)
WBC: 4.7 10*3/uL (ref 4.0–10.5)

## 2015-05-13 LAB — COMPREHENSIVE METABOLIC PANEL
ALBUMIN: 4 g/dL (ref 3.5–5.2)
ALT: 24 U/L (ref 0–53)
AST: 21 U/L (ref 0–37)
Alkaline Phosphatase: 111 U/L (ref 39–117)
BUN: 16 mg/dL (ref 6–23)
CALCIUM: 9.6 mg/dL (ref 8.4–10.5)
CHLORIDE: 105 meq/L (ref 96–112)
CO2: 27 meq/L (ref 19–32)
CREATININE: 0.98 mg/dL (ref 0.40–1.50)
GFR: 79.57 mL/min (ref >60.00)
Glucose, Bld: 112 mg/dL — ABNORMAL HIGH (ref 70–99)
POTASSIUM: 4.1 meq/L (ref 3.5–5.1)
SODIUM: 139 meq/L (ref 135–145)
Total Bilirubin: 1.5 mg/dL — ABNORMAL HIGH (ref 0.2–1.2)
Total Protein: 7.1 g/dL (ref 6.0–8.3)

## 2015-05-13 LAB — FERRITIN: FERRITIN: 112.6 ng/mL (ref 22.0–322.0)

## 2015-05-13 LAB — IBC PANEL
IRON: 93 ug/dL (ref 42–165)
Saturation Ratios: 22.7 % (ref 20.0–50.0)
TRANSFERRIN: 293 mg/dL (ref 212.0–360.0)

## 2015-05-13 NOTE — Progress Notes (Signed)
HPI :  LAST VISIT: 75 y/o male, former patient of Dr. Deatra Ina, new to me, here for reassessment for iron deficiency anemia. He has a history of right sided colon cancer, stage IIIB adenocarcinoma of the cecum, s/p resection in 2013. He has otherwise been followed for iron deficiency anemia historically in association with hemeoccult positive stools of unknown etiology. He has had prior EGD, colonoscopy, and capsule endoscopy as outlined below. Most recent EGD from last month was remarkable for multiple columns of esophageal varices and portal hypertensive gastropathy, concerning for underlying cirrhosis of the liver. His colonoscopy did not show any source of anemia. His most recent Hgb is stable on oral iron.   He has no history of liver disease. No FH of liver disease or HCC. No history of jaundice. He does have some edema in his LE which is usually mild., and endorses some distension of his abdomen at times but he has never been found to have ascites. He reports he has been on nadolol since being seen with varices and tolerating it well. He is taking omeprazole 20mg  daily. He takes NSAIDs only rarely. He has a history of AF and has been off aspirin more recently due to anemia. He denies any alcohol use or prior history of heavy alcohol use. Patient's sister had colon cancer.   Most recent workup CT abdomen - splenomegaly, enlarged prostate, bladder thickening - no mention of liver cirrhosis EGD 10/28/14 - esophageal varices and portal hypertensive gastropathy Colonoscopy 10/28/14 - diverticulosis otherwise normal Capsule endoscopy 03/2013 - normal  SINCE LAST VISIT:  Patient had thrombocytopenia, splenomegally, and a cirrhotic appearing liver on [place]. Elastography scores showed high risk of fibrosis / cirrhosis. His labs for chronic liver disease were negative and he denief an alcohol history. Liver biopsy was performed relatively unremarkable in regards to an etiology and appeared to show a normal  liver. He was referred to [facility] hepatology for evaluation of noncirrhotic portal hypertension. Records reviewed from [facility]. 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 He has no evidence of cirrhosis  He has since followed up with [facility] Hepatology and he reports no changes were made. He is taking nadolol 40mg  daily and tolerating it okay. He is asking about alternatives given the cost of nadolol.   He otherwise continues oral iron. No blood in the stools. He reports he has been told he should have an endoscopy and colonoscopy every year given his history of malignancy and family history of cancer. He reports his genetic counseling workup has recommended he have these exams done yearly. He otherwise denies NSAID use.  Past Medical History  Diagnosis Date  . Diverticulosis     Sigmoid colon  . Personal history of colonic polyps 09/17/2009    Tubular adenoma  . History of hemorrhoids   . Hyperlipidemia   . Anemia   . GERD (gastroesophageal reflux disease) 03-31-11    tx. Omeprazole  . Atrial fibrillation [facility]) 2006    Dr Tonny Bollman af-no meds  . Hypercholesterolemia   . Hernia, abdominal     at the umbilicus  . colon ca dx'd 02/2011  . Pneumonia 01/2013  . OSA on CPAP dx'd 2010    uses cap nightly  . Arthritis     "thumbs" (11/29/2013)  . TBI (traumatic brain injury) ([place]) 1986    S/P fall; "slow to get going q morning" (10//22/2015)  . Fracture of distal femur ([place]) 11/29/2013  . History of irregular heartbeat   .  Hearing loss     has bilateral hearing aids  . Esophageal varices determined by endoscopy ([place]) sept 2016  . 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 Esophageal stricture   . 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 Laparoscopic partial colectomy  04/08/2011  . Colon surgery  2013  . Cardiac  catheterization  07/2002    Dr Wynonia Lawman  . Portacath placement  04/30/2011    Procedure: INSERTION PORT-A-CATH;  Surgeon: Odis Hollingshead, MD;  Location: [place];  Service: General;  Laterality: Right;  . Port-a-cath removal  11/09/2011    Procedure: REMOVAL PORT-A-CATH;  Surgeon: Odis Hollingshead, MD;  Location: [place];  Service: General;  Laterality: N/A;  . Appendectomy  03/2011  . Orif distal femur fracture Right 11/29/2013    "put plate in"  . Inguinal hernia repair Left ~ 1991  . Orif femur fracture Right 11/29/2013    Procedure: OPEN REDUCTION INTERNAL FIXATION (ORIF) RIGHT  DISTAL FEMUR FRACTURE;  Surgeon: Rozanna Box, MD;  Location: [place];  Service: Orthopedics;  Laterality: Right;  . Colonoscopy  sept 2016    upper endo and colonoscopy  . Esophageal dilation  2006   Family History  Problem Relation Age of Onset  . Diabetes Father   . Prostate cancer Father   . Heart attack Father 65  . Heart failure Mother     Congestive heart failure  . Cancer Brother     colon  . Colon cancer Brother 31  . Stomach cancer Sister   . Coronary artery disease Brother     CABG  . Anesthesia problems Neg Hx   . Esophageal cancer Neg Hx   . Colon cancer Other   . Colon cancer Sister    Social History  Substance Use Topics  . Smoking status: Former Smoker -- 0.50 packs/day for 7 years    Types: Cigarettes    Quit date: 03/21/1961  . Smokeless tobacco: Never Used  . Alcohol Use: No   Current Outpatient Prescriptions  Medication Sig Dispense Refill  . amphetamine-dextroamphetamine (ADDERALL) 10 MG tablet Take 30 mg by mouth every morning.     Marland Kitchen doxycycline (VIBRA-TABS) 100 MG tablet Take 1 tablet (100 mg total) by mouth 2 (two) times daily. 14 tablet 0  . ferrous sulfate 325 (65 FE) MG tablet Take 325 mg by mouth 2 (two) times daily.    Marland Kitchen gabapentin (NEURONTIN) 100 MG capsule One by mouth twice a day 200 capsule 3  . nadolol (CORGARD) 40 MG tablet TAKE 1 TABLET BY  MOUTH EVERY DAY 30 tablet 1  . omeprazole (PRILOSEC) 10 MG capsule TAKE 1 BY MOUTH TWICE DAILY 60 capsule 11  . sildenafil (VIAGRA) 100 MG tablet Take 1 tablet (100 mg total) by mouth daily as needed for erectile dysfunction. 10 tablet 10   No current facility-administered medications for this visit.   No Known Allergies   Review of Systems: All systems reviewed and negative except where noted in HPI.   Lab Results  Component Value Date   WBC 4.7 05/13/2015   HGB 13.3 05/13/2015   HCT 38.7* 05/13/2015   MCV 86.2 05/13/2015   PLT 123.0* 05/13/2015   Lab Results  Component Value Date   CREATININE 0.98 05/13/2015   BUN 16 05/13/2015   NA 139 05/13/2015  K 4.1 05/13/2015   CL 105 05/13/2015   CO2 27 05/13/2015    Lab Results  Component Value Date   ALT 24 05/13/2015   AST 21 05/13/2015   ALKPHOS 111 05/13/2015   BILITOT 1.5* 05/13/2015   Lab Results  Component Value Date   IRON 93 05/13/2015   TIBC 330 04/16/2011   FERRITIN 112.6 05/13/2015     Physical Exam: BP 124/72 mmHg  Pulse 52  Ht 5' 11.75" (1.822 m)  Wt 185 lb 6.4 oz (84.097 kg)  BMI 25.33 kg/m2 Constitutional: Pleasant,well-developed, male in no acute distress. HEENT: Normocephalic and atraumatic. Conjunctivae are normal. No scleral icterus. Neck supple.  Cardiovascular: Normal rate, regular rhythm.  Pulmonary/chest: Effort normal and breath sounds normal. No wheezing, rales or rhonchi. Abdominal: Soft, nondistended, nontender. Bowel sounds active throughout. There are no masses palpable. No hepatomegaly. Extremities: no edema Lymphadenopathy: No cervical adenopathy noted. Neurological: Alert and oriented to person place and time. Skin: Skin is warm and dry. No rashes noted. Psychiatric: Normal mood and affect. Behavior is normal.   ASSESSMENT AND PLAN: 75 y/o male with history of colon cancer s/p surgery and chemotherapy, who has been followed for iron deficiency anemia. 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   We discussed his diagnosis of sinusoidal sclerosis, natural history of portal hypertension, risks of complications from this, and long term plan. He will continue to see Hepatology once per year at this time. He has no history of ascites. He will continue beta blockade for his esophageal varices - his HR is 52 and he is tolerating nadolol. I offered him propranolol to make it cheaper for him but will increase the pill volume he takes - he wants to think about this prior to changing. I otherwise counseled him to avoid NSAIDs, he can take low dose tylenol PRN for pains. We repeated labs today which are stable. Regarding his anemia, suspect from portal hypertensive gastropathy, it has resolved on oral iron.   Otherwise he is requesting yearly EGD / colonoscopy for his history of malignancy and based upon his genetic testing. ACG guidelines do not recommend yearly colonoscopy for post surgical surveillance after a normal exam, however I will touch base with his Oncologist about this recommendation to clarify the findings about his genetic testing. He will follow up with me in 4 months or so for reassessment otherwise. All questions answered.   [place] Cellar, MD [facility] Pager [phone]

## 2015-05-13 NOTE — Patient Instructions (Signed)
You are scheduled for your last hepatitis b injection on Monday, 06/09/15 @ 9:00 am.  Your physician has requested that you go to the basement for the following lab work before leaving today: CBC, CMET, IBC, Ferritin  Call your insurance about propranolol.  If you are age 74 or older, your body mass index should be between 23-30. Your Body mass index is 25.33 kg/(m^2). If this is out of the aforementioned range listed, please consider follow up with your Primary Care Provider.  If you are age 43 or younger, your body mass index should be between 19-25. Your Body mass index is 25.33 kg/(m^2). If this is out of the aformentioned range listed, please consider follow up with your Primary Care Provider.

## 2015-05-26 ENCOUNTER — Other Ambulatory Visit: Payer: Self-pay | Admitting: Gastroenterology

## 2015-05-30 DIAGNOSIS — Z1389 Encounter for screening for other disorder: Secondary | ICD-10-CM | POA: Diagnosis not present

## 2015-05-30 DIAGNOSIS — G629 Polyneuropathy, unspecified: Secondary | ICD-10-CM | POA: Diagnosis not present

## 2015-05-30 DIAGNOSIS — K766 Portal hypertension: Secondary | ICD-10-CM | POA: Diagnosis not present

## 2015-05-30 DIAGNOSIS — Z6824 Body mass index (BMI) 24.0-24.9, adult: Secondary | ICD-10-CM | POA: Diagnosis not present

## 2015-06-10 ENCOUNTER — Ambulatory Visit (INDEPENDENT_AMBULATORY_CARE_PROVIDER_SITE_OTHER): Payer: Medicare Other | Admitting: Gastroenterology

## 2015-06-10 DIAGNOSIS — Z23 Encounter for immunization: Secondary | ICD-10-CM | POA: Diagnosis not present

## 2015-06-17 ENCOUNTER — Other Ambulatory Visit (HOSPITAL_COMMUNITY)
Admission: RE | Admit: 2015-06-17 | Discharge: 2015-06-17 | Disposition: A | Discharge status: Home / Self Care | Payer: Medicare Other | Source: Ambulatory Visit | Attending: Oncology | Admitting: Oncology

## 2015-06-17 ENCOUNTER — Ambulatory Visit (HOSPITAL_BASED_OUTPATIENT_CLINIC_OR_DEPARTMENT_OTHER): Payer: Medicare Other | Admitting: Oncology

## 2015-06-17 ENCOUNTER — Telehealth: Payer: Self-pay | Admitting: Oncology

## 2015-06-17 ENCOUNTER — Other Ambulatory Visit: Payer: Medicare Other

## 2015-06-17 VITALS — BP 115/49 | HR 54 | Temp 98.1°F | Resp 18 | Ht 71.0 in | Wt 185.6 lb

## 2015-06-17 DIAGNOSIS — M342 Systemic sclerosis induced by drug and chemical: Secondary | ICD-10-CM | POA: Diagnosis not present

## 2015-06-17 DIAGNOSIS — K766 Portal hypertension: Secondary | ICD-10-CM

## 2015-06-17 DIAGNOSIS — C182 Malignant neoplasm of ascending colon: Secondary | ICD-10-CM

## 2015-06-17 DIAGNOSIS — T451X5A Adverse effect of antineoplastic and immunosuppressive drugs, initial encounter: Secondary | ICD-10-CM | POA: Diagnosis not present

## 2015-06-17 DIAGNOSIS — R319 Hematuria, unspecified: Secondary | ICD-10-CM | POA: Insufficient documentation

## 2015-06-17 DIAGNOSIS — R161 Splenomegaly, not elsewhere classified: Secondary | ICD-10-CM | POA: Diagnosis not present

## 2015-06-17 NOTE — Telephone Encounter (Signed)
Gave pt appt & avs °

## 2015-06-17 NOTE — Progress Notes (Signed)
[name] returns as scheduled. He has occasional loose stools. No bleeding. He is now taking a beta blocker after being diagnosed with sinusoidal sclerosis. He has been evaluated by Dr. Havery [name] and by the hepatology service at [facility].    he would like to continue frequent surveillance colonoscopy. He reports multiple family members have been diagnosed with colon cancer. His sister was diagnosed with gastric cancer. His brother's son was diagnosed with both gastric and colon cancer. He is not aware of other family members undergoing specific genetic testing.  Objective:  Vital signs in last 24 hours:  Blood pressure 115/49, pulse 54, temperature 98.1 F (36.7 C), temperature source Oral, resp. rate 18, height 5' 11" (1.803 m), weight 185 lb 9.6 oz (84.188 kg), SpO2 100 %.    HEENT:  Neck without mass Lymphatics:  No cervical, supraclavicular, axillary, or inguinal nodes Resp:  Lungs clear bilaterally Cardio:  Regular rate and rhythm GI:  No hepatosplenomegaly, no apparent ascites, small reducible umbilical hernia Vascular:  No leg edema, the right lower leg is slightly larger than the left side   Lab Results:  Lab Results  Component Value Date   WBC 4.7 05/13/2015   HGB 13.3 05/13/2015   HCT 38.7* 05/13/2015   MCV 86.2 05/13/2015   PLT 123.0* 05/13/2015   NEUTROABS 2.9 05/13/2015     Medications: I have reviewed the patient's current medications.  Assessment/Plan: 1. Moderately differentiated adenocarcinoma of the cecum, stage IIIB (T4 N1), status post a right colectomy on 04/08/2011 -the tumor was microsatellite stable and K-ras wild-type . He began adjuvant CAPOX chemotherapy on 05/03/2011. He completed cycle 5 on 07/26/2011 (oxaliplatin was held from cycle 4). He completed cycle 7 beginning 09/06/2011. He completed cycle 8 beginning 09/27/2011.   CTs of the chest,  abdomen, and pelvis 05/15/2013-negative for recurrent colon cancer  CTs of the chest, abdomen, and pelvis 06/12/2014-negative for recurrent colon cancer 2. History of colon polyps. He underwent a surveillance colonoscopy by Dr. Deatra [name] 10/28/2014 3. History of anemia secondary to iron deficiency and chemotherapy. 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 Postoperative abdominal wall/flank hematoma.  5. Sleep apnea.  6. History of atrial fibrillation.  7. History of esophageal stricture.  8. Family history of colon cancer and gastric cancer. The tumor was microsatellite stable and no mutation was found in the mismatch repair genes. A CancerNext panel was negative.  9. 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  11. 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 Abdominal wall hernia noted on a CT of the abdomen 12/03/2011.  13. Splenomegaly-noted on CT scans back to January of 2013.  14. Stool positive for occult blood 02/12/2013. Ferritin 38 02/14/2013. Colonoscopy and upper endoscopy 03/07/2013 with no source for GI blood loss identified. Capsule endoscopy 03/20/2013 negative. Upper endoscopy September 2016 confirmed esophagus varices and portal hypertensive gastropathy. 15. Colonoscopy on 03/07/2013. At the anastomosis along the suture line there were several areas of prominent mucosa. Biopsies were taken. Moderate diverticulosis was noted in the sigmoid colon. Internal hemorrhoids also noted. No definite source for GI blood loss was identified. Biopsy of the anastomosis site showed benign colonic mucosa with inflamed granulation tissue. No adenomatous change or malignancy. He subsequently underwent an upper endoscopy (03/07/2013) which was  normal.  16. Right  distal femur fracture October 2015 status post surgery. 28.  Diagnosed with oxaliplatin-induced sinusoidal sclerosis accounting for portal hypertension with splenomegaly   Disposition:   [name] remains in clinical remission from colon cancer. We will follow-up on the CEA from today.   he has a family history of colon/gastric cancer consistent with HNPCC , though no mutation was found on genetic testing. It is recommended he continue surveillance as if he had HNPCC.  We checked a urine cytology today.  He has been diagnosed with oxaliplatin induced sinusoidal sclerosis. This appears to account for the portal hypertension.   [name] will return for an office visit and CEA in 8 months.  Betsy Coder, MD  06/17/2015  12:16 PM

## 2015-06-18 LAB — CEA: CEA1: 1.7 ng/mL (ref 0.0–4.7)

## 2015-06-18 LAB — CEA (PARALLEL TESTING): CEA: 1.2 ng/mL

## 2015-06-19 ENCOUNTER — Telehealth: Payer: Self-pay | Admitting: *Deleted

## 2015-06-19 NOTE — Telephone Encounter (Signed)
Per Dr. Benay Spice, pt notified that urine is negative for cancer.  Pt has no questions or concerns at this time and is appreciative of call.

## 2015-06-19 NOTE — Telephone Encounter (Signed)
-----   Message from Ladell Pier, MD sent at 06/18/2015  5:45 PM EDT ----- Please call patient, urine is negative for cancer

## 2015-06-20 LAB — CYTOLOGY, URINE

## 2015-06-22 ENCOUNTER — Telehealth: Payer: Self-pay | Admitting: Gastroenterology

## 2015-06-22 NOTE — Progress Notes (Signed)
Thanks for the note, we will get him in for his surveillance exams.  Richardson Landry

## 2015-06-22 NOTE — Telephone Encounter (Signed)
I heard back from Dr. Ammie Dalton. This patient has a personal history of colon cancer and family history concerning for HNPCC even though the patient tested negative for it, they are recommending surveillance as if he has HNPCC / Lynch syndrome. In this light, recommend he have a colonoscopy one year from his last exam, thus due in September 2017. Otherwise, he had an EGD Sept 2016 which did not show any high risk lesions of the stomach in regards to gastric cancer screening. ACG guidelines for HNPCC recommend EGD every 3 years, so recall for EGD in 2019.  Rollene Fare can you please let the patient know recommendations as outlined above? Thanks

## 2015-06-23 NOTE — Telephone Encounter (Signed)
Patient given recommendations. New recalls in EPIC.

## 2015-06-25 DIAGNOSIS — F329 Major depressive disorder, single episode, unspecified: Secondary | ICD-10-CM | POA: Diagnosis not present

## 2015-10-10 ENCOUNTER — Telehealth: Payer: Self-pay | Admitting: Gastroenterology

## 2015-10-10 NOTE — Telephone Encounter (Signed)
Patient is scheduled for pre-visit 10/22/15 and colon on 11/07/15

## 2015-10-10 NOTE — Telephone Encounter (Signed)
Left message for patient to call back  

## 2015-10-14 DIAGNOSIS — I2510 Atherosclerotic heart disease of native coronary artery without angina pectoris: Secondary | ICD-10-CM | POA: Diagnosis not present

## 2015-10-14 DIAGNOSIS — K766 Portal hypertension: Secondary | ICD-10-CM | POA: Diagnosis not present

## 2015-10-14 DIAGNOSIS — E785 Hyperlipidemia, unspecified: Secondary | ICD-10-CM | POA: Diagnosis not present

## 2015-10-14 DIAGNOSIS — I480 Paroxysmal atrial fibrillation: Secondary | ICD-10-CM | POA: Diagnosis not present

## 2015-10-22 ENCOUNTER — Ambulatory Visit (AMBULATORY_SURGERY_CENTER): Payer: Self-pay

## 2015-10-22 ENCOUNTER — Telehealth: Payer: Self-pay

## 2015-10-22 VITALS — Ht 72.0 in | Wt 184.2 lb

## 2015-10-22 DIAGNOSIS — Z85038 Personal history of other malignant neoplasm of large intestine: Secondary | ICD-10-CM

## 2015-10-22 MED ORDER — SUPREP BOWEL PREP KIT 17.5-3.13-1.6 GM/177ML PO SOLN: 1.0000 | Freq: Once | ORAL | 0 refills | Status: AC

## 2015-10-22 NOTE — Telephone Encounter (Signed)
This patient has a personal history of colon cancer and family history concerning for HNPCC even though the patient tested negative for it, Oncology is recommending surveillance as if he has HNPCC / Lynch syndrome. In this light, he is scheduled for colonoscopy at this time.   Otherwise, he had an EGD Sept 2016 which did not show any high risk lesions of the stomach in regards to gastric cancer screening. ACG guidelines for HNPCC recommend EGD every 3 years, so recall for EGD in 2019. Regarding the varices he is on nadolol, and does not warrant repeat EGD for varices screening. Can you please let him know. Thanks

## 2015-10-22 NOTE — Telephone Encounter (Signed)
Dr Havery Moros,      Last double with RK in 2016 pt never followed-up on because Dr Deatra Ina "left".  Now he wants to know if you should add EGD on or not.  Please advise.                                                              Angela/PV

## 2015-10-24 ENCOUNTER — Encounter: Payer: Self-pay | Admitting: Gastroenterology

## 2015-10-24 DIAGNOSIS — D509 Iron deficiency anemia, unspecified: Secondary | ICD-10-CM | POA: Diagnosis not present

## 2015-10-24 DIAGNOSIS — Z79899 Other long term (current) drug therapy: Secondary | ICD-10-CM | POA: Diagnosis not present

## 2015-10-24 DIAGNOSIS — Z125 Encounter for screening for malignant neoplasm of prostate: Secondary | ICD-10-CM | POA: Diagnosis not present

## 2015-10-24 DIAGNOSIS — K766 Portal hypertension: Secondary | ICD-10-CM | POA: Diagnosis not present

## 2015-10-24 NOTE — Telephone Encounter (Signed)
LMOM for pt to call us back so MD's message could be relayed.  So far no return call.                [name]/PV

## 2015-10-29 DIAGNOSIS — D509 Iron deficiency anemia, unspecified: Secondary | ICD-10-CM | POA: Diagnosis not present

## 2015-10-29 DIAGNOSIS — K219 Gastro-esophageal reflux disease without esophagitis: Secondary | ICD-10-CM | POA: Diagnosis not present

## 2015-10-29 DIAGNOSIS — G629 Polyneuropathy, unspecified: Secondary | ICD-10-CM | POA: Diagnosis not present

## 2015-10-29 DIAGNOSIS — E784 Other hyperlipidemia: Secondary | ICD-10-CM | POA: Diagnosis not present

## 2015-10-29 DIAGNOSIS — Z0000 Encounter for general adult medical examination without abnormal findings: Secondary | ICD-10-CM | POA: Diagnosis not present

## 2015-10-29 DIAGNOSIS — F909 Attention-deficit hyperactivity disorder, unspecified type: Secondary | ICD-10-CM | POA: Diagnosis not present

## 2015-10-29 DIAGNOSIS — Z6825 Body mass index (BMI) 25.0-25.9, adult: Secondary | ICD-10-CM | POA: Diagnosis not present

## 2015-10-29 DIAGNOSIS — C189 Malignant neoplasm of colon, unspecified: Secondary | ICD-10-CM | POA: Diagnosis not present

## 2015-10-29 DIAGNOSIS — K766 Portal hypertension: Secondary | ICD-10-CM | POA: Diagnosis not present

## 2015-10-29 DIAGNOSIS — K740 Hepatic fibrosis: Secondary | ICD-10-CM | POA: Diagnosis not present

## 2015-10-30 DIAGNOSIS — Z1212 Encounter for screening for malignant neoplasm of rectum: Secondary | ICD-10-CM | POA: Diagnosis not present

## 2015-11-07 ENCOUNTER — Encounter: Payer: Self-pay | Admitting: Gastroenterology

## 2015-11-07 ENCOUNTER — Ambulatory Visit (AMBULATORY_SURGERY_CENTER): Payer: Medicare Other | Admitting: Gastroenterology

## 2015-11-07 VITALS — BP 132/48 | HR 70 | Temp 98.4°F | Resp 17 | Ht 72.0 in | Wt 184.0 lb

## 2015-11-07 DIAGNOSIS — D125 Benign neoplasm of sigmoid colon: Secondary | ICD-10-CM

## 2015-11-07 DIAGNOSIS — I2510 Atherosclerotic heart disease of native coronary artery without angina pectoris: Secondary | ICD-10-CM | POA: Diagnosis not present

## 2015-11-07 DIAGNOSIS — D122 Benign neoplasm of ascending colon: Secondary | ICD-10-CM

## 2015-11-07 DIAGNOSIS — G4733 Obstructive sleep apnea (adult) (pediatric): Secondary | ICD-10-CM | POA: Diagnosis not present

## 2015-11-07 DIAGNOSIS — K91840 Postprocedural hemorrhage and hematoma of a digestive system organ or structure following a digestive system procedure: Secondary | ICD-10-CM | POA: Diagnosis not present

## 2015-11-07 DIAGNOSIS — D123 Benign neoplasm of transverse colon: Secondary | ICD-10-CM | POA: Diagnosis not present

## 2015-11-07 DIAGNOSIS — Z980 Intestinal bypass and anastomosis status: Secondary | ICD-10-CM | POA: Diagnosis not present

## 2015-11-07 DIAGNOSIS — Z85038 Personal history of other malignant neoplasm of large intestine: Secondary | ICD-10-CM | POA: Diagnosis not present

## 2015-11-07 DIAGNOSIS — I491 Atrial premature depolarization: Secondary | ICD-10-CM | POA: Diagnosis not present

## 2015-11-07 DIAGNOSIS — Z86010 Personal history of colonic polyps: Secondary | ICD-10-CM | POA: Diagnosis not present

## 2015-11-07 DIAGNOSIS — I4891 Unspecified atrial fibrillation: Secondary | ICD-10-CM | POA: Diagnosis not present

## 2015-11-07 DIAGNOSIS — D127 Benign neoplasm of rectosigmoid junction: Secondary | ICD-10-CM

## 2015-11-07 MED ORDER — SODIUM CHLORIDE 0.9 % IV SOLN: 500.0000 mL | INTRAVENOUS | Status: DC

## 2015-11-07 NOTE — Progress Notes (Signed)
Moderate amount of air expelled after administering Levsin.

## 2015-11-07 NOTE — Op Note (Addendum)
[place] Patient Name: Kyeon Smittle Procedure Date: 11/07/2015 2:06 PM MRN: [id number] Endoscopist: Remo Lipps P. Havery Moros , MD Age: 75 Referring MD:  Date of Birth: Aug 17, 1940 Gender: Male Account #: [id number] Procedure:                Colonoscopy Indications:              High risk colon cancer surveillance: Personal                            history of colon cancer, being surveyed as if he                            has HNPCC per Oncology Medicines:                Monitored Anesthesia Care Procedure:                Pre-Anesthesia Assessment:                           - Prior to the procedure, a History and Physical                            was performed, and patient medications and                            allergies were reviewed. The patient's tolerance of                            previous anesthesia was also reviewed. The risks                            and benefits of the procedure and the sedation                            options and risks were discussed with the patient.                            All questions were answered, and informed consent                            was obtained. Prior Anticoagulants: The patient has                            taken no previous anticoagulant or antiplatelet                            agents. ASA Grade Assessment: III - A patient with                            severe systemic disease. After reviewing the risks                            and benefits, the patient was deemed in  satisfactory condition to undergo the procedure.                           After obtaining informed consent, the colonoscope                            was passed under direct vision. Throughout the                            procedure, the patient's blood pressure, pulse, and                            oxygen saturations were monitored continuously. The                            Model CF-HQ190L [id number]) scope  was introduced                            through the anus and advanced to the the                            ileocolonic anastomosis. The colonoscopy was                            performed without difficulty. The patient tolerated                            the procedure well. The quality of the bowel                            preparation was good., surgical anastomosis were                            photographed. Scope In: 2:13:38 PM Scope Out: 2:38:19 PM Scope Withdrawal Time: 0 hours 19 minutes 35 seconds  Total Procedure Duration: 0 hours 24 minutes 41 seconds  Findings:                 The perianal and digital rectal examinations were                            normal.                           A 8 mm polyp was found in the recto-sigmoid colon.                            The polyp was pedunculated. The polyp was removed                            with a hot snare. Resection and retrieval were                            complete.  A 6 mm polyp was found in the sigmoid colon. The                            polyp was sessile. The polyp was removed with a                            cold snare. Resection and retrieval were complete.                           A 8 mm polyp was found in the splenic flexure. The                            polyp was semi-pedunculated. The polyp was removed                            with a hot snare. Resection and retrieval were                            complete.                           There was evidence of a prior end-to-side                            ileo-colonic anastomosis in the ascending colon.                            The anatomosis at the entrance to the ileum was not                            able to be intubated due to angulation, and was                            characterized by nodularity. The anastomosis was                            not traversed. Biopsies were taken with a cold                             forceps for histology to ensure no adenomatous                            change.                           Multiple sessile polyps were found in the                            anastomosis along the staple line. The polyps were                            3 to 6 mm in size. I suspect they may likely be  inflammatory / reactive, two representative polyps                            were removed with a hot snare to ensure no                            adenomatous changes. Resection and retrieval were                            complete.                           Multiple medium-mouthed diverticula were found in                            the left colon.                           Non-bleeding internal hemorrhoids were found during                            retroflexion. The hemorrhoids were moderate.                           The exam was otherwise without abnormality. Of                            note, the patient complained of passing some blood                            per rectum after the procedure. Given the                            interventions as above, we took the patient back to                            the endoscopy room and placed the colonoscope                            again, and the anastomosis was reached. The polyp                            sites all looked good without any evidence of                            bleeding, no blood noted in the colon. The was a                            small mucosal wrent at the distal rectum / dentate                            line likely due to retroflexion which probably  caused his symptoms. There was no bleeding noted at                            the time of the exam when it ended. Total of 4                            minutes additional time for the follow up endoscopy Complications:            No immediate complications. Estimated blood loss:                             Minimal. Estimated Blood Loss:     Estimated blood loss was minimal. Impression:               - One 8 mm polyp at the recto-sigmoid colon,                            removed with a hot snare. Resected and retrieved.                           - One 6 mm polyp in the sigmoid colon, removed with                            a cold snare. Resected and retrieved.                           - One 8 mm polyp at the splenic flexure, removed                            with a hot snare. Resected and retrieved.                           - End-to-side ileo-colonic anastomosis,                            characterized by nodularity. Biopsied.                           - Multiple 3 to 6 mm polyps at the anastomosis, 2                            representative polyps removed with a hot snare.                            Resected and retrieved.                           - Diverticulosis in the left colon.                           - Non-bleeding internal hemorrhoids.                           - The examination was otherwise normal.                           -  Small mucosal wrent at the rectum likely due to                            retroflexion, caused diminutive bleeding which has                            since stopped. Recommendation:           - Patient has a contact number available for                            emergencies. The signs and symptoms of potential                            delayed complications were discussed with the                            patient. Return to normal activities tomorrow.                            Written discharge instructions were provided to the                            patient.                           - Resume previous diet.                           - Continue present medications.                           - No ibuprofen, naproxen, or other non-steroidal                            anti-inflammatory drugs for 2 weeks after polyp                             removal.                           - Await pathology results.                           - Repeat colonoscopy is recommended for                            surveillance. The colonoscopy date will be                            determined after pathology results from today's                            exam become available for review. Remo Lipps P. Clemmie Marxen, MD 11/07/2015 2:48:08 PM This report has been signed electronically.

## 2015-11-07 NOTE — Progress Notes (Signed)
To recovery, report to McCoy, RN, VSS 

## 2015-11-07 NOTE — Progress Notes (Signed)
No egg or soy allergy known to patient  No issues with past sedation with any surgeries  or procedures, no intubation problems  No diet pills per patient No home 02 use per patient  No blood thinners per patient  Pt denies issues with constipation  No A fib SINCE 2006 PER PT.  Last week had a FOBT hem positive from PCP per pt.

## 2015-11-07 NOTE — Patient Instructions (Signed)
Discharge instructions given. Handouts on polyps,diverticulosis and hemorrhoids. No ibuprofen,naproxen,or other non-steroidal anti-inflammatory drugs for 2 weeks. Resume previous medications. YOU HAD AN ENDOSCOPIC PROCEDURE TODAY AT THE [facility]:   Refer to the procedure report that was given to you for any specific questions about what was found during the examination.  If the procedure report does not answer your questions, please call your gastroenterologist to clarify.  If you requested that your care partner not be given the details of your procedure findings, then the procedure report has been included in a sealed envelope for you to review at your convenience later.  YOU SHOULD EXPECT: Some feelings of bloating in the abdomen. Passage of more gas than usual.  Walking can help get rid of the air that was put into your GI tract during the procedure and reduce the bloating. If you had a lower endoscopy (such as a colonoscopy or flexible sigmoidoscopy) you may notice spotting of blood in your stool or on the toilet paper. If you underwent a bowel prep for your procedure, you may not have a normal bowel movement for a few days.  Please Note:  You might notice some irritation and congestion in your nose or some drainage.  This is from the oxygen used during your procedure.  There is no need for concern and it should clear up in a day or so.  SYMPTOMS TO REPORT IMMEDIATELY:   Following lower endoscopy (colonoscopy or flexible sigmoidoscopy):  Excessive amounts of blood in the stool  Significant tenderness or worsening of abdominal pains  Swelling of the abdomen that is new, acute  Fever of 100F or higher   For urgent or emergent issues, a gastroenterologist can be reached at any hour by calling [phone].   DIET:  We do recommend a small meal at first, but then you may proceed to your regular diet.  Drink plenty of fluids but you should avoid alcoholic beverages for 24  hours.  ACTIVITY:  You should plan to take it easy for the rest of today and you should NOT DRIVE or use heavy machinery until tomorrow (because of the sedation medicines used during the test).    FOLLOW UP: Our staff will call the number listed on your records the next business day following your procedure to check on you and address any questions or concerns that you may have regarding the information given to you following your procedure. If we do not reach you, we will leave a message.  However, if you are feeling well and you are not experiencing any problems, there is no need to return our call.  We will assume that you have returned to your regular daily activities without incident.  If any biopsies were taken you will be contacted by phone or by letter within the next 1-3 weeks.  Please call us at [phone] if you have not heard about the biopsies in 3 weeks.    SIGNATURES/CONFIDENTIALITY: You and/or your care partner have signed paperwork which will be entered into your electronic medical record.  These signatures attest to the fact that that the information above on your After Visit Summary has been reviewed and is understood.  Full responsibility of the confidentiality of this discharge information lies with you and/or your care-partner. 

## 2015-11-07 NOTE — Progress Notes (Signed)
Patient taken back to procedure room to examine for bleeding while in recovery room. Restarted IV to left forearm. Exam went well per Dr. Havery Moros.  Patient alert and verbal.

## 2015-11-07 NOTE — Progress Notes (Signed)
Called to room to assist during endoscopic procedure.  Patient ID and intended procedure confirmed with present staff. Received instructions for my participation in the procedure from the performing physician.  

## 2015-11-10 ENCOUNTER — Telehealth: Payer: Self-pay | Admitting: *Deleted

## 2015-11-10 DIAGNOSIS — Z1212 Encounter for screening for malignant neoplasm of rectum: Secondary | ICD-10-CM | POA: Diagnosis not present

## 2015-11-10 NOTE — Telephone Encounter (Signed)
  Follow up Call-  Call back number 11/07/2015 10/28/2014 03/07/2013  Post procedure Call Back phone  # [phone] hm  Permission to leave phone message Yes Yes Yes  Some recent data might be hidden      Patient questions:  Do you have a fever, pain , or abdominal swelling? No. Pain Score  0 *  Have you tolerated food without any problems? Yes.    Have you been able to return to your normal activities? Yes.    Do you have any questions about your discharge instructions: Diet   No. Medications  No. Follow up visit  No.  Do you have questions or concerns about your Care? No.  Actions: * If pain score is 4 or above: No action needed, pain <4.

## 2015-11-14 ENCOUNTER — Encounter: Payer: Self-pay | Admitting: Gastroenterology

## 2015-11-27 DIAGNOSIS — F329 Major depressive disorder, single episode, unspecified: Secondary | ICD-10-CM | POA: Diagnosis not present

## 2015-11-28 DIAGNOSIS — H2513 Age-related nuclear cataract, bilateral: Secondary | ICD-10-CM | POA: Diagnosis not present

## 2015-11-28 DIAGNOSIS — H35372 Puckering of macula, left eye: Secondary | ICD-10-CM | POA: Diagnosis not present

## 2016-02-16 ENCOUNTER — Other Ambulatory Visit: Payer: Self-pay | Admitting: Nurse Practitioner

## 2016-02-17 ENCOUNTER — Telehealth: Payer: Self-pay | Admitting: Oncology

## 2016-02-17 ENCOUNTER — Ambulatory Visit (HOSPITAL_BASED_OUTPATIENT_CLINIC_OR_DEPARTMENT_OTHER): Payer: Medicare Other

## 2016-02-17 ENCOUNTER — Ambulatory Visit (HOSPITAL_BASED_OUTPATIENT_CLINIC_OR_DEPARTMENT_OTHER): Payer: Medicare Other | Admitting: Oncology

## 2016-02-17 ENCOUNTER — Other Ambulatory Visit (HOSPITAL_BASED_OUTPATIENT_CLINIC_OR_DEPARTMENT_OTHER): Payer: Medicare Other

## 2016-02-17 VITALS — BP 136/50 | HR 72 | Temp 98.4°F | Resp 18 | Wt 189.6 lb

## 2016-02-17 DIAGNOSIS — C182 Malignant neoplasm of ascending colon: Secondary | ICD-10-CM

## 2016-02-17 DIAGNOSIS — R161 Splenomegaly, not elsewhere classified: Secondary | ICD-10-CM

## 2016-02-17 DIAGNOSIS — I4891 Unspecified atrial fibrillation: Secondary | ICD-10-CM | POA: Diagnosis not present

## 2016-02-17 DIAGNOSIS — K766 Portal hypertension: Secondary | ICD-10-CM

## 2016-02-17 DIAGNOSIS — D696 Thrombocytopenia, unspecified: Secondary | ICD-10-CM | POA: Diagnosis not present

## 2016-02-17 LAB — CBC WITH DIFFERENTIAL/PLATELET
BASO%: 0.3 % (ref 0.0–2.0)
Basophils Absolute: 0 10*3/uL (ref 0.0–0.1)
EOS%: 2.1 % (ref 0.0–7.0)
Eosinophils Absolute: 0.1 10*3/uL (ref 0.0–0.5)
HEMATOCRIT: 36.8 % — AB (ref 38.4–49.9)
HGB: 13.1 g/dL (ref 13.0–17.1)
LYMPH#: 0.8 10*3/uL — AB (ref 0.9–3.3)
LYMPH%: 24.4 % (ref 14.0–49.0)
MCH: 29.9 pg (ref 27.2–33.4)
MCHC: 35.6 g/dL (ref 32.0–36.0)
MCV: 84 fL (ref 79.3–98.0)
MONO#: 0.3 10*3/uL (ref 0.1–0.9)
MONO%: 7.4 % (ref 0.0–14.0)
NEUT%: 65.8 % (ref 39.0–75.0)
NEUTROS ABS: 2.2 10*3/uL (ref 1.5–6.5)
PLATELETS: 79 10*3/uL — AB (ref 140–400)
RBC: 4.38 10*6/uL (ref 4.20–5.82)
RDW: 14.7 % — AB (ref 11.0–14.6)
WBC: 3.4 10*3/uL — ABNORMAL LOW (ref 4.0–10.3)

## 2016-02-17 LAB — CEA (IN HOUSE-CHCC): CEA (CHCC-IN HOUSE): 1.44 ng/mL (ref 0.00–5.00)

## 2016-02-17 NOTE — Telephone Encounter (Signed)
Patient sent back to lab and given avs report and appointments for January 2018.

## 2016-02-17 NOTE — Progress Notes (Signed)
[place] OFFICE PROGRESS NOTE   Diagnosis: Colon cancer  INTERVAL HISTORY:   [name] returns as scheduled. He feels well. He continues to have neuropathy symptoms in the feet. The neuropathy discomfort is partially relieved with gabapentin. He underwent a surveillance colonoscopy in September. Multiple polyps were removed including a tubular adenoma.   Objective:  Vital signs in last 24 hours:  Blood pressure (!) 136/50, pulse 72, temperature 98.4 F (36.9 C), resp. rate 18, weight 189 lb 9.6 oz (86 kg), SpO2 100 %.    HEENT: Neck without mass Lymphatics: No cervical, supraclavicular, axillary, or inguinal nodes Resp: Lungs clear bilaterally Cardio: Regular rate and rhythm GI: No hepatosplenomegaly, no apparent ascites, reducible umbilical hernia Vascular: No leg edema  Lab Results:  Lab Results  Component Value Date   WBC 4.7 05/13/2015   HGB 13.3 05/13/2015   HCT 38.7 (L) 05/13/2015   MCV 86.2 05/13/2015   PLT 123.0 (L) 05/13/2015   NEUTROABS 2.9 05/13/2015     Medications: I have reviewed the patient's current medications.  Assessment/Plan: 1.  Moderately differentiated adenocarcinoma of the cecum, stage IIIB (T4 N1), status post a right colectomy on 04/08/2011 -the tumor was microsatellite stable and K-ras wild-type . He began adjuvant CAPOX chemotherapy on 05/03/2011. He completed cycle 5 on 07/26/2011 (oxaliplatin was held from cycle 4). He completed cycle 7 beginning 09/06/2011. He completed cycle 8 beginning 09/27/2011.   CTs of the chest, abdomen, and pelvis 05/15/2013-negative for recurrent colon cancer  CTs of the chest, abdomen, and pelvis 06/12/2014-negative for recurrent colon cancer 2. History of colon polyps. He underwent a surveillance colonoscopy 11/07/2015 3. History of anemia secondary to iron deficiency and chemotherapy. 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  Postoperative abdominal wall/flank hematoma.  5. Sleep apnea.  6. History of atrial fibrillation.  7. History of esophageal stricture.  8. Family history of colon cancer and gastric cancer. The tumor was microsatellite stable and no mutation was found in the mismatch repair genes. A CancerNext panel was negative.  9. 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  11. 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 Abdominal wall hernia noted on a CT of the abdomen 12/03/2011.  13. Splenomegaly-noted on CT scans back to January of 2013.  14. Stool positive for occult blood 02/12/2013. Ferritin 38 02/14/2013. Colonoscopy and upper endoscopy 03/07/2013 with no source for GI blood loss identified. Capsule endoscopy 03/20/2013 negative. Upper endoscopy September 2016 confirmed esophagus varices and portal hypertensive gastropathy. 15. Colonoscopy on 03/07/2013. At the anastomosis along the suture line there were several areas of prominent mucosa. Biopsies were taken. Moderate diverticulosis was noted in the sigmoid colon. Internal hemorrhoids also noted. No definite source for GI blood loss was identified. Biopsy of the anastomosis site showed benign colonic mucosa with inflamed granulation tissue. No adenomatous change or malignancy. He subsequently underwent an upper endoscopy (03/07/2013) which was normal.  16. Right distal femur fracture October 2015 status post surgery. 38.  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 He will continue follow-up with gastroenterology for management of portal hypertension. He will continue surveillance endoscopies with Dr. Havery Moros. He would like to have a  repeat upper endoscopy given his family history of cancer including his sister's history  of gastric cancer.  We will follow-up on the CEA from today. [name] will return for an office visit in one year.  Betsy Coder, MD  02/17/2016  11:18 AM

## 2016-02-18 LAB — CEA: CEA: 1.7 ng/mL (ref 0.0–4.7)

## 2016-02-24 ENCOUNTER — Encounter (HOSPITAL_COMMUNITY): Payer: Self-pay | Admitting: Emergency Medicine

## 2016-02-24 DIAGNOSIS — K922 Gastrointestinal hemorrhage, unspecified: Secondary | ICD-10-CM | POA: Diagnosis not present

## 2016-02-24 DIAGNOSIS — G4733 Obstructive sleep apnea (adult) (pediatric): Secondary | ICD-10-CM | POA: Diagnosis present

## 2016-02-24 DIAGNOSIS — Z85028 Personal history of other malignant neoplasm of stomach: Secondary | ICD-10-CM

## 2016-02-24 DIAGNOSIS — H9193 Unspecified hearing loss, bilateral: Secondary | ICD-10-CM | POA: Diagnosis not present

## 2016-02-24 DIAGNOSIS — Z801 Family history of malignant neoplasm of trachea, bronchus and lung: Secondary | ICD-10-CM

## 2016-02-24 DIAGNOSIS — K219 Gastro-esophageal reflux disease without esophagitis: Secondary | ICD-10-CM | POA: Diagnosis present

## 2016-02-24 DIAGNOSIS — Z9049 Acquired absence of other specified parts of digestive tract: Secondary | ICD-10-CM

## 2016-02-24 DIAGNOSIS — Z87891 Personal history of nicotine dependence: Secondary | ICD-10-CM

## 2016-02-24 DIAGNOSIS — D62 Acute posthemorrhagic anemia: Secondary | ICD-10-CM | POA: Diagnosis not present

## 2016-02-24 DIAGNOSIS — K766 Portal hypertension: Secondary | ICD-10-CM | POA: Diagnosis not present

## 2016-02-24 DIAGNOSIS — K2970 Gastritis, unspecified, without bleeding: Secondary | ICD-10-CM | POA: Diagnosis present

## 2016-02-24 DIAGNOSIS — D696 Thrombocytopenia, unspecified: Secondary | ICD-10-CM | POA: Diagnosis not present

## 2016-02-24 DIAGNOSIS — I8501 Esophageal varices with bleeding: Secondary | ICD-10-CM | POA: Diagnosis not present

## 2016-02-24 DIAGNOSIS — E785 Hyperlipidemia, unspecified: Secondary | ICD-10-CM | POA: Diagnosis not present

## 2016-02-24 DIAGNOSIS — Z800 Family history of malignant neoplasm of digestive organs: Secondary | ICD-10-CM

## 2016-02-24 DIAGNOSIS — Z974 Presence of external hearing-aid: Secondary | ICD-10-CM

## 2016-02-24 DIAGNOSIS — Z8249 Family history of ischemic heart disease and other diseases of the circulatory system: Secondary | ICD-10-CM

## 2016-02-24 DIAGNOSIS — T451X5S Adverse effect of antineoplastic and immunosuppressive drugs, sequela: Secondary | ICD-10-CM

## 2016-02-24 DIAGNOSIS — Z9221 Personal history of antineoplastic chemotherapy: Secondary | ICD-10-CM

## 2016-02-24 DIAGNOSIS — Z8042 Family history of malignant neoplasm of prostate: Secondary | ICD-10-CM

## 2016-02-24 DIAGNOSIS — Z85038 Personal history of other malignant neoplasm of large intestine: Secondary | ICD-10-CM

## 2016-02-24 DIAGNOSIS — R0789 Other chest pain: Secondary | ICD-10-CM | POA: Diagnosis not present

## 2016-02-24 LAB — CBC
HCT: 32.4 % — ABNORMAL LOW (ref 39.0–52.0)
Hemoglobin: 11.3 g/dL — ABNORMAL LOW (ref 13.0–17.0)
MCH: 29 pg (ref 26.0–34.0)
MCHC: 34.9 g/dL (ref 30.0–36.0)
MCV: 83.1 fL (ref 78.0–100.0)
Platelets: 129 10*3/uL — ABNORMAL LOW (ref 150–400)
RBC: 3.9 MIL/uL — ABNORMAL LOW (ref 4.22–5.81)
RDW: 14.7 % (ref 11.5–15.5)
WBC: 7.4 10*3/uL (ref 4.0–10.5)

## 2016-02-24 NOTE — ED Triage Notes (Signed)
Pt reports rectal bleeding that began at 5pm today; hx of colon cancer and esophageal varices; pt has liver damage from previous chemo; states bleeding is dark red; denies N/V

## 2016-02-25 ENCOUNTER — Inpatient Hospital Stay (HOSPITAL_COMMUNITY): Payer: Medicare Other

## 2016-02-25 ENCOUNTER — Inpatient Hospital Stay (HOSPITAL_COMMUNITY): Payer: Medicare Other | Admitting: Anesthesiology

## 2016-02-25 ENCOUNTER — Encounter (HOSPITAL_COMMUNITY)
Admission: EM | Disposition: A | Discharge status: Home / Self Care | Payer: Self-pay | Source: Home / Self Care | Attending: Internal Medicine

## 2016-02-25 ENCOUNTER — Encounter (HOSPITAL_COMMUNITY): Payer: Self-pay | Admitting: *Deleted

## 2016-02-25 ENCOUNTER — Inpatient Hospital Stay (HOSPITAL_COMMUNITY)
Admission: EM | Admit: 2016-02-25 | Discharge: 2016-02-29 | DRG: 369 | Disposition: A | Discharge status: Home / Self Care | Payer: Medicare Other | Attending: Internal Medicine | Admitting: Internal Medicine

## 2016-02-25 DIAGNOSIS — Z8249 Family history of ischemic heart disease and other diseases of the circulatory system: Secondary | ICD-10-CM | POA: Diagnosis not present

## 2016-02-25 DIAGNOSIS — Z8042 Family history of malignant neoplasm of prostate: Secondary | ICD-10-CM | POA: Diagnosis not present

## 2016-02-25 DIAGNOSIS — Z800 Family history of malignant neoplasm of digestive organs: Secondary | ICD-10-CM | POA: Diagnosis not present

## 2016-02-25 DIAGNOSIS — K922 Gastrointestinal hemorrhage, unspecified: Secondary | ICD-10-CM | POA: Diagnosis not present

## 2016-02-25 DIAGNOSIS — R0602 Shortness of breath: Secondary | ICD-10-CM

## 2016-02-25 DIAGNOSIS — I8501 Esophageal varices with bleeding: Principal | ICD-10-CM

## 2016-02-25 DIAGNOSIS — R0789 Other chest pain: Secondary | ICD-10-CM | POA: Diagnosis not present

## 2016-02-25 DIAGNOSIS — Z8719 Personal history of other diseases of the digestive system: Secondary | ICD-10-CM | POA: Diagnosis not present

## 2016-02-25 DIAGNOSIS — R079 Chest pain, unspecified: Secondary | ICD-10-CM | POA: Diagnosis not present

## 2016-02-25 DIAGNOSIS — K766 Portal hypertension: Secondary | ICD-10-CM | POA: Diagnosis not present

## 2016-02-25 DIAGNOSIS — H9193 Unspecified hearing loss, bilateral: Secondary | ICD-10-CM | POA: Diagnosis not present

## 2016-02-25 DIAGNOSIS — D696 Thrombocytopenia, unspecified: Secondary | ICD-10-CM | POA: Diagnosis not present

## 2016-02-25 DIAGNOSIS — E785 Hyperlipidemia, unspecified: Secondary | ICD-10-CM | POA: Diagnosis not present

## 2016-02-25 DIAGNOSIS — Z87891 Personal history of nicotine dependence: Secondary | ICD-10-CM | POA: Diagnosis not present

## 2016-02-25 DIAGNOSIS — G4733 Obstructive sleep apnea (adult) (pediatric): Secondary | ICD-10-CM | POA: Diagnosis present

## 2016-02-25 DIAGNOSIS — Z85038 Personal history of other malignant neoplasm of large intestine: Secondary | ICD-10-CM | POA: Diagnosis present

## 2016-02-25 DIAGNOSIS — T451X5S Adverse effect of antineoplastic and immunosuppressive drugs, sequela: Secondary | ICD-10-CM | POA: Diagnosis not present

## 2016-02-25 DIAGNOSIS — Z85028 Personal history of other malignant neoplasm of stomach: Secondary | ICD-10-CM | POA: Diagnosis not present

## 2016-02-25 DIAGNOSIS — K219 Gastro-esophageal reflux disease without esophagitis: Secondary | ICD-10-CM | POA: Diagnosis not present

## 2016-02-25 DIAGNOSIS — I4891 Unspecified atrial fibrillation: Secondary | ICD-10-CM | POA: Diagnosis not present

## 2016-02-25 DIAGNOSIS — D62 Acute posthemorrhagic anemia: Secondary | ICD-10-CM | POA: Diagnosis not present

## 2016-02-25 DIAGNOSIS — Z974 Presence of external hearing-aid: Secondary | ICD-10-CM | POA: Diagnosis not present

## 2016-02-25 DIAGNOSIS — Z801 Family history of malignant neoplasm of trachea, bronchus and lung: Secondary | ICD-10-CM | POA: Diagnosis not present

## 2016-02-25 DIAGNOSIS — Z9049 Acquired absence of other specified parts of digestive tract: Secondary | ICD-10-CM | POA: Diagnosis not present

## 2016-02-25 DIAGNOSIS — K2970 Gastritis, unspecified, without bleeding: Secondary | ICD-10-CM | POA: Diagnosis not present

## 2016-02-25 DIAGNOSIS — Z9221 Personal history of antineoplastic chemotherapy: Secondary | ICD-10-CM | POA: Diagnosis not present

## 2016-02-25 HISTORY — PX: ESOPHAGOGASTRODUODENOSCOPY (EGD) WITH PROPOFOL: SHX5813

## 2016-02-25 HISTORY — DX: Gastrointestinal hemorrhage, unspecified: K92.2

## 2016-02-25 HISTORY — DX: Thrombocytopenia, unspecified: D69.6

## 2016-02-25 LAB — CBC
HEMATOCRIT: 30.1 % — AB (ref 39.0–52.0)
Hemoglobin: 10.7 g/dL — ABNORMAL LOW (ref 13.0–17.0)
MCH: 30.1 pg (ref 26.0–34.0)
MCHC: 35.5 g/dL (ref 30.0–36.0)
MCV: 84.6 fL (ref 78.0–100.0)
Platelets: 102 10*3/uL — ABNORMAL LOW (ref 150–400)
RBC: 3.56 MIL/uL — ABNORMAL LOW (ref 4.22–5.81)
RDW: 14.9 % (ref 11.5–15.5)
WBC: 6.3 10*3/uL (ref 4.0–10.5)

## 2016-02-25 LAB — COMPREHENSIVE METABOLIC PANEL
ALBUMIN: 3.5 g/dL (ref 3.5–5.0)
ALT: 24 U/L (ref 17–63)
ALT: 22 U/L (ref 17–63)
ANION GAP: 6 (ref 5–15)
ANION GAP: 7 (ref 5–15)
AST: 21 U/L (ref 15–41)
AST: 22 U/L (ref 15–41)
Albumin: 3.7 g/dL (ref 3.5–5.0)
Alkaline Phosphatase: 73 U/L (ref 38–126)
Alkaline Phosphatase: 64 U/L (ref 38–126)
BUN: 25 mg/dL — ABNORMAL HIGH (ref 6–20)
BUN: 35 mg/dL — ABNORMAL HIGH (ref 6–20)
CHLORIDE: 108 mmol/L (ref 101–111)
CO2: 24 mmol/L (ref 22–32)
CO2: 22 mmol/L (ref 22–32)
CREATININE: 0.89 mg/dL (ref 0.61–1.24)
Calcium: 8.8 mg/dL — ABNORMAL LOW (ref 8.9–10.3)
Calcium: 8.8 mg/dL — ABNORMAL LOW (ref 8.9–10.3)
Chloride: 110 mmol/L (ref 101–111)
Creatinine, Ser: 0.78 mg/dL (ref 0.61–1.24)
GFR calc Af Amer: >60 mL/min (ref >60)
GFR calc non Af Amer: >60 mL/min (ref >60)
GLUCOSE: 151 mg/dL — AB (ref 65–99)
Glucose, Bld: 159 mg/dL — ABNORMAL HIGH (ref 65–99)
POTASSIUM: 4.5 mmol/L (ref 3.5–5.1)
POTASSIUM: 4.6 mmol/L (ref 3.5–5.1)
Sodium: 138 mmol/L (ref 135–145)
Sodium: 139 mmol/L (ref 135–145)
TOTAL PROTEIN: 6.2 g/dL — AB (ref 6.5–8.1)
Total Bilirubin: 1.9 mg/dL — ABNORMAL HIGH (ref 0.3–1.2)
Total Bilirubin: 2.3 mg/dL — ABNORMAL HIGH (ref 0.3–1.2)
Total Protein: 6.5 g/dL (ref 6.5–8.1)

## 2016-02-25 LAB — POC OCCULT BLOOD, ED: FECAL OCCULT BLD: POSITIVE — AB

## 2016-02-25 LAB — MRSA PCR SCREENING: MRSA BY PCR: NEGATIVE

## 2016-02-25 LAB — PROTIME-INR
INR: 1.47
Prothrombin Time: 18 seconds — ABNORMAL HIGH (ref 11.4–15.2)

## 2016-02-25 LAB — HEMOGLOBIN AND HEMATOCRIT, BLOOD
HCT: 27.3 % — ABNORMAL LOW (ref 39.0–52.0)
HEMOGLOBIN: 9.6 g/dL — AB (ref 13.0–17.0)

## 2016-02-25 LAB — ABO/RH: ABO/RH(D): B POS

## 2016-02-25 SURGERY — ESOPHAGOGASTRODUODENOSCOPY (EGD) WITH PROPOFOL
Anesthesia: General

## 2016-02-25 MED ORDER — PROPOFOL 10 MG/ML IV BOLUS
INTRAVENOUS | Status: DC | PRN
  Administered 2016-02-25: 30 mg via INTRAVENOUS
  Administered 2016-02-25: 40 mg via INTRAVENOUS

## 2016-02-25 MED ORDER — SODIUM CHLORIDE 0.9 % IV SOLN
80.0000 mg | Freq: Once | INTRAVENOUS | Status: AC
  Administered 2016-02-25: 80 mg via INTRAVENOUS
  Filled 2016-02-25: qty 80

## 2016-02-25 MED ORDER — LACTATED RINGERS IV SOLN
INTRAVENOUS | Status: DC | PRN
  Administered 2016-02-25 (×2): via INTRAVENOUS

## 2016-02-25 MED ORDER — PHENYLEPHRINE HCL 10 MG/ML IJ SOLN
INTRAMUSCULAR | Status: DC | PRN
  Administered 2016-02-25 (×7): 80 ug via INTRAVENOUS

## 2016-02-25 MED ORDER — GABAPENTIN 100 MG PO CAPS
100.0000 mg | ORAL_CAPSULE | Freq: Two times a day (BID) | ORAL | Status: DC
  Administered 2016-02-25 – 2016-02-29 (×5): 100 mg via ORAL
  Filled 2016-02-25 (×6): qty 1

## 2016-02-25 MED ORDER — CEFTRIAXONE SODIUM 1 G IJ SOLR
1.0000 g | Freq: Every day | INTRAMUSCULAR | Status: DC
  Administered 2016-02-25 – 2016-02-26 (×2): 1 g via INTRAVENOUS
  Filled 2016-02-25 (×2): qty 10

## 2016-02-25 MED ORDER — PANTOPRAZOLE SODIUM 40 MG IV SOLR
40.0000 mg | Freq: Once | INTRAVENOUS | Status: AC
  Administered 2016-02-25: 40 mg via INTRAVENOUS
  Filled 2016-02-25: qty 40

## 2016-02-25 MED ORDER — OCTREOTIDE LOAD VIA INFUSION
50.0000 ug | Freq: Once | INTRAVENOUS | Status: AC
  Administered 2016-02-25: 50 ug via INTRAVENOUS
  Filled 2016-02-25: qty 25

## 2016-02-25 MED ORDER — ONDANSETRON HCL 4 MG PO TABS: 4.0000 mg | ORAL_TABLET | Freq: Four times a day (QID) | ORAL | Status: DC | PRN

## 2016-02-25 MED ORDER — METOPROLOL SUCCINATE ER 25 MG PO TB24: 50.0000 mg | ORAL_TABLET | Freq: Every day | ORAL | Status: DC

## 2016-02-25 MED ORDER — KETOROLAC TROMETHAMINE 15 MG/ML IJ SOLN
15.0000 mg | Freq: Four times a day (QID) | INTRAMUSCULAR | Status: DC | PRN
  Administered 2016-02-25 – 2016-02-26 (×3): 15 mg via INTRAVENOUS
  Filled 2016-02-25 (×3): qty 1

## 2016-02-25 MED ORDER — SODIUM CHLORIDE 0.9 % IV SOLN
8.0000 mg/h | INTRAVENOUS | Status: DC
  Administered 2016-02-25 – 2016-02-29 (×6): 8 mg/h via INTRAVENOUS
  Filled 2016-02-25 (×16): qty 80

## 2016-02-25 MED ORDER — SIMVASTATIN 10 MG PO TABS
20.0000 mg | ORAL_TABLET | Freq: Every day | ORAL | Status: DC
  Filled 2016-02-25: qty 1

## 2016-02-25 MED ORDER — SUCCINYLCHOLINE CHLORIDE 200 MG/10ML IV SOSY
PREFILLED_SYRINGE | INTRAVENOUS | Status: DC | PRN
  Administered 2016-02-25: 100 mg via INTRAVENOUS

## 2016-02-25 MED ORDER — ONDANSETRON HCL 4 MG/2ML IJ SOLN
4.0000 mg | Freq: Four times a day (QID) | INTRAMUSCULAR | Status: DC | PRN
  Administered 2016-02-25: 4 mg via INTRAVENOUS
  Filled 2016-02-25: qty 2

## 2016-02-25 MED ORDER — PROPOFOL 500 MG/50ML IV EMUL
INTRAVENOUS | Status: DC | PRN
  Administered 2016-02-25: 120 ug/kg/min via INTRAVENOUS

## 2016-02-25 MED ORDER — LORAZEPAM 2 MG/ML IJ SOLN: 0.5000 mg | Freq: Two times a day (BID) | INTRAMUSCULAR | Status: DC | PRN

## 2016-02-25 MED ORDER — SODIUM CHLORIDE 0.9 % IV SOLN: INTRAVENOUS | Status: DC

## 2016-02-25 MED ORDER — LIDOCAINE 2% (20 MG/ML) 5 ML SYRINGE
INTRAMUSCULAR | Status: AC
  Filled 2016-02-25: qty 5

## 2016-02-25 MED ORDER — ALBUTEROL SULFATE (2.5 MG/3ML) 0.083% IN NEBU: 2.5000 mg | INHALATION_SOLUTION | RESPIRATORY_TRACT | Status: DC | PRN

## 2016-02-25 MED ORDER — PANTOPRAZOLE SODIUM 40 MG IV SOLR: 40.0000 mg | Freq: Two times a day (BID) | INTRAVENOUS | Status: DC

## 2016-02-25 MED ORDER — SODIUM CHLORIDE 0.9 % IJ SOLN
INTRAMUSCULAR | Status: AC
  Filled 2016-02-25: qty 10

## 2016-02-25 MED ORDER — LACTATED RINGERS IV SOLN: INTRAVENOUS | Status: DC

## 2016-02-25 MED ORDER — SODIUM CHLORIDE 0.9 % IV SOLN
INTRAVENOUS | Status: DC
  Administered 2016-02-25: 05:00:00 via INTRAVENOUS
  Administered 2016-02-25 (×2): 75 mL/h via INTRAVENOUS
  Administered 2016-02-27 – 2016-02-28 (×2): via INTRAVENOUS

## 2016-02-25 MED ORDER — PHENYLEPHRINE 40 MCG/ML (10ML) SYRINGE FOR IV PUSH (FOR BLOOD PRESSURE SUPPORT)
PREFILLED_SYRINGE | INTRAVENOUS | Status: AC
  Filled 2016-02-25: qty 10

## 2016-02-25 MED ORDER — LIDOCAINE 2% (20 MG/ML) 5 ML SYRINGE
INTRAMUSCULAR | Status: DC | PRN
  Administered 2016-02-25: 40 mg via INTRAVENOUS

## 2016-02-25 MED ORDER — SODIUM CHLORIDE 0.9 % IV SOLN
50.0000 ug/h | INTRAVENOUS | Status: DC
  Administered 2016-02-25 – 2016-02-29 (×9): 50 ug/h via INTRAVENOUS
  Filled 2016-02-25 (×16): qty 1

## 2016-02-25 MED ORDER — PROPOFOL 10 MG/ML IV BOLUS
INTRAVENOUS | Status: AC
  Filled 2016-02-25: qty 60

## 2016-02-25 MED ORDER — SUCCINYLCHOLINE CHLORIDE 200 MG/10ML IV SOSY
PREFILLED_SYRINGE | INTRAVENOUS | Status: AC
  Filled 2016-02-25: qty 10

## 2016-02-25 MED ORDER — PHENYLEPHRINE HCL 10 MG/ML IJ SOLN
INTRAMUSCULAR | Status: AC
  Filled 2016-02-25: qty 1

## 2016-02-25 SURGICAL SUPPLY — 14 items

## 2016-02-25 NOTE — Progress Notes (Signed)
Rt placed pt on CPAP per order. Pt requesting to go to sleep.Pt wears CPAP at home witgh no 02 bleed in. Pt vitals stable no distress noted at this time.

## 2016-02-25 NOTE — Transfer of Care (Signed)
Immediate Anesthesia Transfer of Care Note  Patient: [name]  Procedure(s) Performed: Procedure(s): ESOPHAGOGASTRODUODENOSCOPY (EGD) WITH PROPOFOL (N/A)  Patient Location: PACU and Endoscopy Unit  Anesthesia Type:General  Level of Consciousness: awake and patient cooperative  Airway & Oxygen Therapy: Patient Spontanous Breathing and Patient connected to face mask oxygen  Post-op Assessment: Report given to RN and Post -op Vital signs reviewed and stable  Post vital signs: Reviewed and stable  Last Vitals:  Vitals:   02/25/16 1347 02/25/16 1554  BP: (!) 101/50 (!) 134/50  Pulse: 84 92  Resp: 17 16  Temp: 36.7 C     Last Pain:  Vitals:   02/25/16 1347  TempSrc: Oral  PainSc:          Complications: No apparent anesthesia complications

## 2016-02-25 NOTE — ED Notes (Signed)
Bed: [id number] Expected date:  Expected time:  Means of arrival:  Comments: EMS GI bleed

## 2016-02-25 NOTE — Progress Notes (Signed)
PROGRESS NOTE    [name]  [id number] DOB: [birth year] DOA: 02/25/2016 PCP: Geoffery Lyons, MD    Brief Narrative:  76 y.o. 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 Patient reports that he has had a total of 3 loose bowel movements in which blood is mixed in with the stool. Associated symptoms include generalized malaise, lightheadedness, or decreased appetite. Denies having any abdominal pain, nausea, vomiting, loss of consciousness, or falls. Patient sees Dr. Havery Moros of GI in the outpatient setting. Patient notes that his last colonoscopy was on 10/2015 in which multiple polyps were removed. His last EGD was from 10/2014 which showed grade 2-3 esophageal varices  Upon admission into the emergency department patient was seen have a hemoglobin of 11.3 (previously 13.1 just 1  week ago).  Stool guaiac was noted to be positive. Patient was given a dose of Protonix and gastroenterology was consulted.   Assessment & Plan:   Principal Problem:   Upper GI bleed Active Problems:   Disorder of bilirubin excretion   History of colon cancer   Thrombocytopenia (HCC)   History of esophageal varices  GI bleed with acute blood loss anemia:  - Patient presented with one-day history of bloody stools.  - Patient with 2 gm drop in hemoglobin over the past week - Stools noted to be guaiac positive.  - GI consulted. Plan for endoscopy this afternoon   History of colon cancer s/p resection with oxaliplatin induced sinusoidal sclerosis and esophageal varices -Patient notes that this is a reason from his portal hypertension with grade 2-3 esophageal varices noted on last EGD in 10/2014.  -GI following  Gilbert syndrome -Stable at this  time -repeat CMP  Thrombocytopenia: chronic.  - Patient with a platelet count 102 today - Will repeat cbc in AM  DVT prophylaxis: SCD's Code Status: Full Family Communication: Pt in room, wife at bedside Disposition Plan: Uncertain at this time  Consultants:   GI  Procedures:     Antimicrobials: Anti-infectives    Start     Dose/Rate Route Frequency Ordered Stop   02/25/16 1030  cefTRIAXone (ROCEPHIN) 1 g in dextrose 5 % 50 mL IVPB     1 g 100 mL/hr over 30 Minutes Intravenous Daily 02/25/16 1000        Subjective: No complaints at this time  Objective: Vitals:   02/25/16 1600 02/25/16 1605 02/25/16 1610 02/25/16 1615  BP: (!) 130/47  (!) 123/56   Pulse: 80 78 79 80  Resp: 16 15 14 14   Temp:  98.2 F (36.8 C)    TempSrc:  Oral    SpO2: 100% 100% 100% 100%  Weight:      Height:        Intake/Output Summary (Last 24 hours) at 02/25/16 1642 Last data filed at 02/25/16 1540  Gross per 24 hour  Intake             2600 ml  Output              100 ml  Net             2500 ml   Filed Weights   02/24/16 2312 02/25/16 1347  Weight: 82.6 kg (182 lb) 82.6 kg (182 lb)    Examination:  General exam: Appears calm and comfortable  Respiratory system: Clear to auscultation. Respiratory effort normal. Cardiovascular system: S1 & S2 heard, RRR Gastrointestinal system: Abdomen is nondistended, soft and nontender. No organomegaly or masses felt. Normal bowel sounds heard. Central nervous system: Alert and oriented. No focal neurological deficits. Extremities: Symmetric 5 x 5 power. Skin: No rashes, lesions  Psychiatry: Judgement and insight appear normal. Mood & affect appropriate.   Data Reviewed: I have personally reviewed following labs and imaging studies  CBC:  Recent Labs Lab 02/24/16 2328 02/25/16 0455 02/25/16 1120  WBC 7.4 6.3  --   HGB 11.3* 10.7* 9.6*  HCT 32.4* 30.1* 27.3*  MCV 83.1 84.6  --   PLT 129* 102*  --    Basic Metabolic  Panel:  Recent Labs Lab 02/24/16 2328 02/25/16 0455  NA 138 139  K 4.5 4.6  CL 108 110  CO2 24 22  GLUCOSE 159* 151*  BUN 25* 35*  CREATININE 0.89 0.78  CALCIUM 8.8* 8.8*   GFR: Estimated Creatinine Clearance: 94.2 mL/min (by C-G formula based on SCr of 0.78 mg/dL). Liver Function Tests:  Recent Labs Lab 02/24/16 2328 02/25/16 0455  AST 21 22  ALT 24 22  ALKPHOS 73 64  BILITOT 1.9* 2.3*  PROT 6.5 6.2*  ALBUMIN 3.7 3.5   No results for input(s): LIPASE, AMYLASE in the last 168 hours. No results for input(s): AMMONIA in the last 168 hours. Coagulation Profile:  Recent Labs Lab 02/25/16 1125  INR 1.47   Cardiac Enzymes: No results for input(s): CKTOTAL, CKMB, CKMBINDEX, TROPONINI in the last 168 hours. BNP (last 3 results) No results for input(s): PROBNP in the last 8760 hours. HbA1C: No results for input(s): HGBA1C in the last 72 hours. CBG: No results for input(s): GLUCAP in the last 168 hours. Lipid Profile: No results for input(s): CHOL, HDL, LDLCALC, TRIG, CHOLHDL, LDLDIRECT in the last 72 hours. Thyroid Function Tests: No results for input(s): TSH, T4TOTAL, FREET4, T3FREE, THYROIDAB in the last 72 hours. Anemia Panel: No results for input(s): VITAMINB12, FOLATE, FERRITIN, TIBC, IRON, RETICCTPCT in the last 72 hours. Sepsis Labs: No results for input(s): PROCALCITON, LATICACIDVEN in the last 168 hours.  No results found for this or any previous visit (from the past 240 hour(s)).   Radiology Studies: No results found.  Scheduled Meds: . cefTRIAXone (ROCEPHIN)  IV  1 g Intravenous Daily  . gabapentin  100 mg Oral BID  . metoprolol succinate  50 mg Oral QHS  . simvastatin  20 mg Oral Daily   Continuous Infusions: . sodium chloride Stopped (02/25/16 0644)  . octreotide  (SANDOSTATIN)    IV infusion 50 mcg/hr (02/25/16 [id number])  . pantoprozole (PROTONIX) infusion 8 mg/hr (02/25/16 1106)     LOS: 0 days   Eufemio Strahm, Orpah Melter, MD [facility] Pager [phone]  If 7PM-7AM, please contact night-coverage [email or web address] Password [facility] 02/25/2016, 4:42 PM

## 2016-02-25 NOTE — Progress Notes (Signed)
Patient complained of shortness of breath, right sided chest pain, and anxiety, MD paged, O2 on patient and vitals stable. STAT chest x ray placed.

## 2016-02-25 NOTE — H&P (View-Only) (Signed)
Referring Provider: EDP Primary Care Physician:  Geoffery Lyons, MD Primary Gastroenterologist:  Dr. Havery Moros  Reason for Consultation:  GI bleed  HPI: [name] is a 76 y.o. 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  He presented to the ED on 1/16 with complaints of bloody stools since around 5 PM that day. Patient reported that he had a total of 3 loose bowel movements in which dark maroon blood was mixed in with the stool.  Says that he is on iron so stools are always a little dark in color but were darker than usual for a couple of days leading up to this.  Denies having abdominal pain, vomiting but did have a severe episode of nausea this AM, which quickly resolved.  Had two other episodes of bleeding since coming to the ED with the last around 6 AM today. Patient sees Dr. Havery Moros of GI in the outpatient setting. Patient's last colonoscopy was on 10/2015 in which multiple polyps were removed; also had diverticulosis and internal hemorrhoids. His last EGD was from 10/2014, which showed grade 2-3 esophageal varices.  He is no longer on nadolol; is on metoprolol per his PCP.  ED Course: Upon admission into the emergency department patient was seen have a hemoglobin of 11.3 grams (previously 13.1 grams just 1 week ago).  Stool guaiac was noted to be positive. Patient was given a dose of Protonix and gastroenterology was consulted.    Octreotide was also started.  Hgb continuing to trend down to 10.7 grams and most recent 9.6 grams.  BUN elevated at 35.    Past Medical History:  Diagnosis Date  . Anemia   . Arthritis    "thumbs" (11/29/2013)  . Atrial fibrillation [facility]) 2006   Dr Tonny Bollman af-no meds  . colon ca dx'd 02/2011   COLON CANCER  . Diverticulosis    Sigmoid colon  . Esophageal stricture   . Esophageal  varices determined by endoscopy ([place]) sept 2016  . Fracture of distal femur ([place]) 11/29/2013  . GERD (gastroesophageal reflux disease) 03-31-11   tx. Omeprazole  . Hearing loss    has bilateral hearing aids  . Hernia, abdominal    at the umbilicus  . History of hemorrhoids   . History of irregular heartbeat   . Hypercholesterolemia   . Hyperlipidemia   . Neuropathy ([place]) 2013   as a result of chemo  . OSA on CPAP dx'd 2010   uses cap nightly  . Oxygen deficiency   . Personal history of colonic polyps 09/17/2009   Tubular adenoma  . Pneumonia 01/2013  . 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 Sleep apnea    uses c pap at home  . TBI (traumatic brain injury) ([place]) 1986   S/P fall; "slow to get going q morning" (10//22/2015)    Past Surgical History:  Procedure Laterality Date  . APPENDECTOMY  03/2011  . CARDIAC CATHETERIZATION  07/2002   Dr Wynonia Lawman  . COLON SURGERY  2013  . COLONOSCOPY  sept 2016   upper endo and colonoscopy  . ESOPHAGEAL DILATION  2006  . INGUINAL HERNIA REPAIR Left ~ 1991  . LAPAROSCOPIC PARTIAL COLECTOMY  04/08/2011  . ORIF DISTAL FEMUR FRACTURE Right 11/29/2013   "put plate in"  . ORIF FEMUR FRACTURE Right 11/29/2013  Procedure: OPEN REDUCTION INTERNAL FIXATION (ORIF) RIGHT  DISTAL FEMUR FRACTURE;  Surgeon: Rozanna Box, MD;  Location: [place];  Service: Orthopedics;  Laterality: Right;  . PORT-A-CATH REMOVAL  11/09/2011   Procedure: REMOVAL PORT-A-CATH;  Surgeon: Odis Hollingshead, MD;  Location: [place];  Service: General;  Laterality: N/A;  . PORTACATH PLACEMENT  04/30/2011   Procedure: INSERTION PORT-A-CATH;  Surgeon: Odis Hollingshead, MD;  Location: [place];  Service: General;  Laterality: Right;    Prior to Admission medications   Medication Sig Start Date End Date Taking? Authorizing Provider  amphetamine-dextroamphetamine (ADDERALL) 10 MG tablet Take 30  mg by mouth every morning.  12/30/10  Yes Historical Provider, MD  ferrous sulfate 325 (65 FE) MG tablet Take 325 mg by mouth 2 (two) times daily.   Yes Historical Provider, MD  gabapentin (NEURONTIN) 100 MG capsule One by mouth twice a day 04/16/14  Yes Dorena Cookey, MD  metoprolol succinate (TOPROL-XL) 50 MG 24 hr tablet Take 50 mg by mouth daily. Take with or immediately following a meal.   Yes Historical Provider, MD  omeprazole (PRILOSEC) 20 MG capsule Take 20 mg by mouth daily.   Yes Historical Provider, MD  sildenafil (VIAGRA) 100 MG tablet Take 1 tablet (100 mg total) by mouth daily as needed for erectile dysfunction. 04/16/14  Yes Dorena Cookey, MD  simvastatin (ZOCOR) 20 MG tablet Take 20 mg by mouth daily.   Yes Historical Provider, MD    Current Facility-Administered Medications  Medication Dose Route Frequency Provider Last Rate Last Dose  . 0.9 %  sodium chloride infusion  500 mL Intravenous Continuous Manus Gunning, MD      . 0.9 %  sodium chloride infusion   Intravenous Continuous Norval Morton, MD   Stopped at 02/25/16 [phone]  . albuterol (PROVENTIL) (2.5 MG/3ML) 0.083% nebulizer solution 2.5 mg  2.5 mg Nebulization Q2H PRN Rondell A Tamala Julian, MD      . cefTRIAXone (ROCEPHIN) 1 g in dextrose 5 % 50 mL IVPB  1 g Intravenous Daily Laban Emperor Zehr, PA-C 100 mL/hr at 02/25/16 1034 1 g at 02/25/16 1034  . gabapentin (NEURONTIN) capsule 100 mg  100 mg Oral BID Norval Morton, MD      . metoprolol succinate (TOPROL-XL) 24 hr tablet 50 mg  50 mg Oral QHS Rondell A Smith, MD      . octreotide (SANDOSTATIN) 500 mcg in sodium chloride 0.9 % 250 mL (2 mcg/mL) infusion  50 mcg/hr Intravenous Continuous Manus Gunning, MD 25 mL/hr at 02/25/16 0838 50 mcg/hr at 02/25/16 [id number]  . ondansetron (ZOFRAN) tablet 4 mg  4 mg Oral Q6H PRN Norval Morton, MD       Or  . ondansetron (ZOFRAN) injection 4 mg  4 mg Intravenous Q6H PRN Norval Morton, MD   4 mg at 02/25/16 0850  . pantoprazole  (PROTONIX) 80 mg in sodium chloride 0.9 % 250 mL (0.32 mg/mL) infusion  8 mg/hr Intravenous Continuous Laban Emperor Zehr, PA-C 25 mL/hr at 02/25/16 1106 8 mg/hr at 02/25/16 1106  . simvastatin (ZOCOR) tablet 20 mg  20 mg Oral Daily Norval Morton, MD       Current Outpatient Prescriptions  Medication Sig Dispense Refill  . amphetamine-dextroamphetamine (ADDERALL) 10 MG tablet Take 30 mg by mouth every morning.     . ferrous sulfate 325 (65 FE) MG tablet Take 325 mg by mouth 2 (two) times daily.    Marland Kitchen  gabapentin (NEURONTIN) 100 MG capsule One by mouth twice a day 200 capsule 3  . metoprolol succinate (TOPROL-XL) 50 MG 24 hr tablet Take 50 mg by mouth daily. Take with or immediately following a meal.    . omeprazole (PRILOSEC) 20 MG capsule Take 20 mg by mouth daily.    . sildenafil (VIAGRA) 100 MG tablet Take 1 tablet (100 mg total) by mouth daily as needed for erectile dysfunction. 10 tablet 10  . simvastatin (ZOCOR) 20 MG tablet Take 20 mg by mouth daily.      Allergies as of 02/24/2016  . (No Known Allergies)    Family History  Problem Relation Age of Onset  . Diabetes Father   . Prostate cancer Father   . Heart attack Father 11  . Heart failure Mother     Congestive heart failure  . Cancer Brother     colon  . Colon cancer Brother 39  . Stomach cancer Sister   . Colon cancer Other   . Colon cancer Sister   . Coronary artery disease Brother     CABG  . Lung cancer Brother   . Anesthesia problems Neg Hx   . Esophageal cancer Neg Hx     Social History   Social History  . Marital status: Married    Spouse name: N/A  . Number of children: 2  . Years of education: N/A   Occupational History  . Retired    Social History Main Topics  . Smoking status: Former Smoker    Packs/day: 0.50    Years: 7.00    Types: Cigarettes    Quit date: 03/21/1961  . Smokeless tobacco: Never Used  . Alcohol use No  . Drug use: No  . Sexual activity: Yes   Other Topics Concern  . Not on  file   Social History Narrative  . No narrative on file    Review of Systems: Ten point ROS is O/W negative except as mentioned in HPI.  Physical Exam: Vital signs in last 24 hours: Temp:  [97.9 F (36.6 C)-98.4 F (36.9 C)] 98.4 F (36.9 C) (01/17 0030) Pulse Rate:  [60-89] 88 (01/17 1130) Resp:  [10-26] 16 (01/17 1130) BP: (94-136)/(46-72) 98/58 (01/17 1130) SpO2:  [93 %-100 %] 96 % (01/17 1130) Weight:  [182 lb (82.6 kg)] 182 lb (82.6 kg) (01/16 2312)   General:  Alert, Well-developed, well-nourished, pleasant and cooperative in NAD Head:  Normocephalic and atraumatic. Eyes:  Sclera clear, no icterus.  Conjunctiva pink. Ears:  Normal auditory acuity. Mouth:  No deformity or lesions.   Lungs:  Clear throughout to auscultation.  No wheezes, crackles, or rhonchi.  No increased WOB. Heart:  Regular rate and rhythm; no murmurs, clicks, rubs, or gallops. Abdomen:  Soft, non-distended.  BS present.  Non-tender. Rectal:  Deferred.  Heme positive by EDP.  Msk:  Symmetrical without gross deformities. Pulses:  Normal pulses noted. Extremities:  Without clubbing or edema. Neurologic:  Alert and oriented x 4;  grossly normal neurologically. Skin:  Intact without significant lesions or rashes. Psych:  Alert and cooperative. Normal mood and affect.  Intake/Output from previous day: 01/16 0701 - 01/17 0700 In: 1000 [I.V.:1000] Out: -   Lab Results:  Recent Labs  02/24/16 2328 02/25/16 0455 02/25/16 1120  WBC 7.4 6.3  --   HGB 11.3* 10.7* 9.6*  HCT 32.4* 30.1* 27.3*  PLT 129* 102*  --    BMET  Recent Labs  02/24/16 2328 02/25/16 0455  NA 138 139  K 4.5 4.6  CL 108 110  CO2 24 22  GLUCOSE 159* 151*  BUN 25* 35*  CREATININE 0.89 0.78  CALCIUM 8.8* 8.8*   LFT  Recent Labs  02/25/16 0455  PROT 6.2*  ALBUMIN 3.5  AST 22  ALT 22  ALKPHOS 64  BILITOT 2.3*   IMPRESSION:  -GI bleed:  ? Upper vs lower.  With history of varices and elevated BUN we want to be  sure that this is not a variceal bleed. -ABLA:  Hgb down from 13 grams one week ago to 9.6 grams this AM. -Portal hypertension with varices 2/2 oxaliplatin induced sinusoidal sclerosis (not cirrhosis):  No on nadolol or propanolol. -History of stage III colon cancer s/p right colectomy and chemotherapy in 2013.  Last colonoscopy 10/2015.  Still follows with Dr. Benay Spice.  PLAN: -EGD this afternoon. -Continue octreotide gtt for now and I have started PPI gtt as well. -I will start IV Rocephin for now as well. -Monitor Hgb and transfuse prn. -? If should switch from metoprolol to propanolol upon discharge (says that nadolol was not covered).  ZEHR, JESSICA D.  02/25/2016, 12:02 PM  Pager number [phone]

## 2016-02-25 NOTE — Interval H&P Note (Signed)
History and Physical Interval Note:  02/25/2016 2:23 PM  [name]  has presented today for surgery, with the diagnosis of GI bleed; esophageal varices  The various methods of treatment have been discussed with the patient and family. After consideration of risks, benefits and other options for treatment, the patient has consented to  Procedure(s): ESOPHAGOGASTRODUODENOSCOPY (EGD) WITH PROPOFOL (N/A) as a surgical intervention .  The patient's history has been reviewed, patient examined, no change in status, stable for surgery.  I have reviewed the patient's chart and labs.  Questions were answered to the patient's satisfaction.     Renelda Loma Shawnna Pancake

## 2016-02-25 NOTE — Consult Note (Signed)
Referring Provider: EDP Primary Care Physician:  Geoffery Lyons, MD Primary Gastroenterologist:  Dr. Havery Moros  Reason for Consultation:  GI bleed  HPI: [name] is a 76 y.o. 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  He presented to the ED on 1/16 with complaints of bloody stools since around 5 PM that day. Patient reported that he had a total of 3 loose bowel movements in which dark maroon blood was mixed in with the stool.  Says that he is on iron so stools are always a little dark in color but were darker than usual for a couple of days leading up to this.  Denies having abdominal pain, vomiting but did have a severe episode of nausea this AM, which quickly resolved.  Had two other episodes of bleeding since coming to the ED with the last around 6 AM today. Patient sees Dr. Havery Moros of GI in the outpatient setting. Patient's last colonoscopy was on 10/2015 in which multiple polyps were removed; also had diverticulosis and internal hemorrhoids. His last EGD was from 10/2014, which showed grade 2-3 esophageal varices.  He is no longer on nadolol; is on metoprolol per his PCP.  ED Course: Upon admission into the emergency department patient was seen have a hemoglobin of 11.3 grams (previously 13.1 grams just 1 week ago).  Stool guaiac was noted to be positive. Patient was given a dose of Protonix and gastroenterology was consulted.    Octreotide was also started.  Hgb continuing to trend down to 10.7 grams and most recent 9.6 grams.  BUN elevated at 35.    Past Medical History:  Diagnosis Date  . Anemia   . Arthritis    "thumbs" (11/29/2013)  . Atrial fibrillation [facility]) 2006   Dr Tonny Bollman af-no meds  . colon ca dx'd 02/2011   COLON CANCER  . Diverticulosis    Sigmoid colon  . Esophageal stricture   . Esophageal  varices determined by endoscopy ([place]) sept 2016  . Fracture of distal femur ([place]) 11/29/2013  . GERD (gastroesophageal reflux disease) 03-31-11   tx. Omeprazole  . Hearing loss    has bilateral hearing aids  . Hernia, abdominal    at the umbilicus  . History of hemorrhoids   . History of irregular heartbeat   . Hypercholesterolemia   . Hyperlipidemia   . Neuropathy ([place]) 2013   as a result of chemo  . OSA on CPAP dx'd 2010   uses cap nightly  . Oxygen deficiency   . Personal history of colonic polyps 09/17/2009   Tubular adenoma  . Pneumonia 01/2013  . 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 Sleep apnea    uses c pap at home  . TBI (traumatic brain injury) ([place]) 1986   S/P fall; "slow to get going q morning" (10//22/2015)    Past Surgical History:  Procedure Laterality Date  . APPENDECTOMY  03/2011  . CARDIAC CATHETERIZATION  07/2002   Dr Wynonia Lawman  . COLON SURGERY  2013  . COLONOSCOPY  sept 2016   upper endo and colonoscopy  . ESOPHAGEAL DILATION  2006  . INGUINAL HERNIA REPAIR Left ~ 1991  . LAPAROSCOPIC PARTIAL COLECTOMY  04/08/2011  . ORIF DISTAL FEMUR FRACTURE Right 11/29/2013   "put plate in"  . ORIF FEMUR FRACTURE Right 11/29/2013  Procedure: OPEN REDUCTION INTERNAL FIXATION (ORIF) RIGHT  DISTAL FEMUR FRACTURE;  Surgeon: Rozanna Box, MD;  Location: [place];  Service: Orthopedics;  Laterality: Right;  . PORT-A-CATH REMOVAL  11/09/2011   Procedure: REMOVAL PORT-A-CATH;  Surgeon: Odis Hollingshead, MD;  Location: [place];  Service: General;  Laterality: N/A;  . PORTACATH PLACEMENT  04/30/2011   Procedure: INSERTION PORT-A-CATH;  Surgeon: Odis Hollingshead, MD;  Location: [place];  Service: General;  Laterality: Right;    Prior to Admission medications   Medication Sig Start Date End Date Taking? Authorizing Provider  amphetamine-dextroamphetamine (ADDERALL) 10 MG tablet Take 30  mg by mouth every morning.  12/30/10  Yes Historical Provider, MD  ferrous sulfate 325 (65 FE) MG tablet Take 325 mg by mouth 2 (two) times daily.   Yes Historical Provider, MD  gabapentin (NEURONTIN) 100 MG capsule One by mouth twice a day 04/16/14  Yes Dorena Cookey, MD  metoprolol succinate (TOPROL-XL) 50 MG 24 hr tablet Take 50 mg by mouth daily. Take with or immediately following a meal.   Yes Historical Provider, MD  omeprazole (PRILOSEC) 20 MG capsule Take 20 mg by mouth daily.   Yes Historical Provider, MD  sildenafil (VIAGRA) 100 MG tablet Take 1 tablet (100 mg total) by mouth daily as needed for erectile dysfunction. 04/16/14  Yes Dorena Cookey, MD  simvastatin (ZOCOR) 20 MG tablet Take 20 mg by mouth daily.   Yes Historical Provider, MD    Current Facility-Administered Medications  Medication Dose Route Frequency Provider Last Rate Last Dose  . 0.9 %  sodium chloride infusion  500 mL Intravenous Continuous Manus Gunning, MD      . 0.9 %  sodium chloride infusion   Intravenous Continuous Norval Morton, MD   Stopped at 02/25/16 [phone]  . albuterol (PROVENTIL) (2.5 MG/3ML) 0.083% nebulizer solution 2.5 mg  2.5 mg Nebulization Q2H PRN Rondell A Tamala Julian, MD      . cefTRIAXone (ROCEPHIN) 1 g in dextrose 5 % 50 mL IVPB  1 g Intravenous Daily Laban Emperor Hisae Decoursey, PA-C 100 mL/hr at 02/25/16 1034 1 g at 02/25/16 1034  . gabapentin (NEURONTIN) capsule 100 mg  100 mg Oral BID Norval Morton, MD      . metoprolol succinate (TOPROL-XL) 24 hr tablet 50 mg  50 mg Oral QHS Rondell A Smith, MD      . octreotide (SANDOSTATIN) 500 mcg in sodium chloride 0.9 % 250 mL (2 mcg/mL) infusion  50 mcg/hr Intravenous Continuous Manus Gunning, MD 25 mL/hr at 02/25/16 0838 50 mcg/hr at 02/25/16 [id number]  . ondansetron (ZOFRAN) tablet 4 mg  4 mg Oral Q6H PRN Norval Morton, MD       Or  . ondansetron (ZOFRAN) injection 4 mg  4 mg Intravenous Q6H PRN Norval Morton, MD   4 mg at 02/25/16 0850  . pantoprazole  (PROTONIX) 80 mg in sodium chloride 0.9 % 250 mL (0.32 mg/mL) infusion  8 mg/hr Intravenous Continuous Laban Emperor Maxcine Strong, PA-C 25 mL/hr at 02/25/16 1106 8 mg/hr at 02/25/16 1106  . simvastatin (ZOCOR) tablet 20 mg  20 mg Oral Daily Norval Morton, MD       Current Outpatient Prescriptions  Medication Sig Dispense Refill  . amphetamine-dextroamphetamine (ADDERALL) 10 MG tablet Take 30 mg by mouth every morning.     . ferrous sulfate 325 (65 FE) MG tablet Take 325 mg by mouth 2 (two) times daily.    Marland Kitchen  gabapentin (NEURONTIN) 100 MG capsule One by mouth twice a day 200 capsule 3  . metoprolol succinate (TOPROL-XL) 50 MG 24 hr tablet Take 50 mg by mouth daily. Take with or immediately following a meal.    . omeprazole (PRILOSEC) 20 MG capsule Take 20 mg by mouth daily.    . sildenafil (VIAGRA) 100 MG tablet Take 1 tablet (100 mg total) by mouth daily as needed for erectile dysfunction. 10 tablet 10  . simvastatin (ZOCOR) 20 MG tablet Take 20 mg by mouth daily.      Allergies as of 02/24/2016  . (No Known Allergies)    Family History  Problem Relation Age of Onset  . Diabetes Father   . Prostate cancer Father   . Heart attack Father 69  . Heart failure Mother     Congestive heart failure  . Cancer Brother     colon  . Colon cancer Brother 74  . Stomach cancer Sister   . Colon cancer Other   . Colon cancer Sister   . Coronary artery disease Brother     CABG  . Lung cancer Brother   . Anesthesia problems Neg Hx   . Esophageal cancer Neg Hx     Social History   Social History  . Marital status: Married    Spouse name: N/A  . Number of children: 2  . Years of education: N/A   Occupational History  . Retired    Social History Main Topics  . Smoking status: Former Smoker    Packs/day: 0.50    Years: 7.00    Types: Cigarettes    Quit date: 03/21/1961  . Smokeless tobacco: Never Used  . Alcohol use No  . Drug use: No  . Sexual activity: Yes   Other Topics Concern  . Not on  file   Social History Narrative  . No narrative on file    Review of Systems: Ten point ROS is O/W negative except as mentioned in HPI.  Physical Exam: Vital signs in last 24 hours: Temp:  [97.9 F (36.6 C)-98.4 F (36.9 C)] 98.4 F (36.9 C) (01/17 0030) Pulse Rate:  [60-89] 88 (01/17 1130) Resp:  [10-26] 16 (01/17 1130) BP: (94-136)/(46-72) 98/58 (01/17 1130) SpO2:  [93 %-100 %] 96 % (01/17 1130) Weight:  [182 lb (82.6 kg)] 182 lb (82.6 kg) (01/16 2312)   General:  Alert, Well-developed, well-nourished, pleasant and cooperative in NAD Head:  Normocephalic and atraumatic. Eyes:  Sclera clear, no icterus.  Conjunctiva pink. Ears:  Normal auditory acuity. Mouth:  No deformity or lesions.   Lungs:  Clear throughout to auscultation.  No wheezes, crackles, or rhonchi.  No increased WOB. Heart:  Regular rate and rhythm; no murmurs, clicks, rubs, or gallops. Abdomen:  Soft, non-distended.  BS present.  Non-tender. Rectal:  Deferred.  Heme positive by EDP.  Msk:  Symmetrical without gross deformities. Pulses:  Normal pulses noted. Extremities:  Without clubbing or edema. Neurologic:  Alert and oriented x 4;  grossly normal neurologically. Skin:  Intact without significant lesions or rashes. Psych:  Alert and cooperative. Normal mood and affect.  Intake/Output from previous day: 01/16 0701 - 01/17 0700 In: 1000 [I.V.:1000] Out: -   Lab Results:  Recent Labs  02/24/16 2328 02/25/16 0455 02/25/16 1120  WBC 7.4 6.3  --   HGB 11.3* 10.7* 9.6*  HCT 32.4* 30.1* 27.3*  PLT 129* 102*  --    BMET  Recent Labs  02/24/16 2328 02/25/16 0455  NA 138 139  K 4.5 4.6  CL 108 110  CO2 24 22  GLUCOSE 159* 151*  BUN 25* 35*  CREATININE 0.89 0.78  CALCIUM 8.8* 8.8*   LFT  Recent Labs  02/25/16 0455  PROT 6.2*  ALBUMIN 3.5  AST 22  ALT 22  ALKPHOS 64  BILITOT 2.3*   IMPRESSION:  -GI bleed:  ? Upper vs lower.  With history of varices and elevated BUN we want to be  sure that this is not a variceal bleed. -ABLA:  Hgb down from 13 grams one week ago to 9.6 grams this AM. -Portal hypertension with varices 2/2 oxaliplatin induced sinusoidal sclerosis (not cirrhosis):  No on nadolol or propanolol. -History of stage III colon cancer s/p right colectomy and chemotherapy in 2013.  Last colonoscopy 10/2015.  Still follows with Dr. Benay Spice.  PLAN: -EGD this afternoon. -Continue octreotide gtt for now and I have started PPI gtt as well. -I will start IV Rocephin for now as well. -Monitor Hgb and transfuse prn. -? If should switch from metoprolol to propanolol upon discharge (says that nadolol was not covered).  Jacqueleen Pulver D.  02/25/2016, 12:02 PM  Pager number [phone]

## 2016-02-25 NOTE — ED Provider Notes (Signed)
[place] DEPT Provider Note   CSN: [id number] Arrival date & time: 02/24/16  2300  By signing my name below, I, [name], attest that this documentation has been prepared under the direction and in the presence of Russell Speak, MD. Electronically Signed: Oleh [name], Scribe. 02/25/16. 12:22 AM.   History   Chief Complaint Chief Complaint  Patient presents with  . Rectal Bleeding    HPI [name] is a 76 y.o. male with history of colon and gastric cancer s/p R colectomy in 2013, A-fib not anticoagulated, and  esophageal varices who presents to the ED for evaluation of frank rectal bleeding that began yesterday evening. This patient states that he has noticed "darker stools" recently in the setting of iron supplements that he is prescribed. However approximately 5 hours ago he also noticed frank blood mixed with his stool which is atypical for him. When asked, the patient is also reporting lightheadedness in the last 2 hours. However denies any abdominal or rectal pain. Denies any vomiting. He has no other concerns at this time.    The history is provided by the patient. No language interpreter was used.    Past Medical History:  Diagnosis Date  . Anemia   . Arthritis    "thumbs" (11/29/2013)  . Atrial fibrillation [facility]) 2006   Dr Tonny Bollman af-no meds  . colon ca dx'd 02/2011   COLON CANCER  . Diverticulosis    Sigmoid colon  . Esophageal stricture   . Esophageal varices determined by endoscopy ([place]) sept 2016  . Fracture of distal femur ([place]) 11/29/2013  . GERD (gastroesophageal reflux disease) 03-31-11   tx. Omeprazole  . Hearing loss    has bilateral hearing aids  . Hernia, abdominal    at the umbilicus  . History of hemorrhoids   . History of irregular heartbeat   . Hypercholesterolemia   . Hyperlipidemia   . Neuropathy ([place]) 2013   as a result of chemo  . OSA on CPAP dx'd 2010   uses cap nightly  . Oxygen deficiency   . Personal history of  colonic polyps 09/17/2009   Tubular adenoma  . Pneumonia 01/2013  . 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 Sleep apnea    uses c pap at home  . TBI (traumatic brain injury) ([place]) 1986   S/P fall; "slow to get going q morning" (10//22/2015)    Patient Active Problem List   Diagnosis Date Noted  . Maxillary sinusitis, acute 05/06/2015  . Fibrosis of liver ([place])   . 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 Fracture of distal femur ([place]) 11/29/2013  . CAP (community acquired pneumonia) 02/12/2013  . Viral URI with cough 01/24/2013  . CAD (coronary artery disease)   . Nephrolithiasis   . Malignant neoplasm of ascending colon ([place]) 03/03/2011  . 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 Gilbert's syndrome   . Obstructive sleep apnea 12/14/2007  . HYPERGLYCEMIA, FASTING 12/13/2007  . Atrial fibrillation ([place]) 10/03/2007  . Hyperlipidemia 09/22/2006  . 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 BPH (benign prostatic hypertrophy)     Past Surgical History:  Procedure Laterality Date  . APPENDECTOMY  03/2011  . CARDIAC CATHETERIZATION  07/2002   Dr Wynonia Lawman  . COLON SURGERY  2013  . COLONOSCOPY  sept 2016   upper endo and colonoscopy  . ESOPHAGEAL DILATION  2006  . INGUINAL HERNIA REPAIR Left ~ 1991  . LAPAROSCOPIC PARTIAL  COLECTOMY  04/08/2011  . ORIF DISTAL FEMUR FRACTURE Right 11/29/2013   "put plate in"  . ORIF FEMUR FRACTURE Right 11/29/2013   Procedure: OPEN REDUCTION INTERNAL FIXATION (ORIF) RIGHT  DISTAL FEMUR FRACTURE;  Surgeon: Rozanna Box, MD;  Location: [place];  Service: Orthopedics;  Laterality: Right;  . PORT-A-CATH REMOVAL  11/09/2011   Procedure: REMOVAL PORT-A-CATH;  Surgeon: Odis Hollingshead, MD;  Location: [place];  Service: General;  Laterality: N/A;  . PORTACATH PLACEMENT  04/30/2011   Procedure: INSERTION PORT-A-CATH;  Surgeon: Odis Hollingshead, MD;   Location: [place];  Service: General;  Laterality: Right;       Home Medications    Prior to Admission medications   Medication Sig Start Date End Date Taking? Authorizing Provider  amphetamine-dextroamphetamine (ADDERALL) 10 MG tablet Take 30 mg by mouth every morning.  12/30/10   Historical Provider, MD  ferrous sulfate 325 (65 FE) MG tablet Take 325 mg by mouth 2 (two) times daily.    Historical Provider, MD  gabapentin (NEURONTIN) 100 MG capsule One by mouth twice a day 04/16/14   Dorena Cookey, MD  metoprolol (LOPRESSOR) 50 MG tablet Take 50 mg by mouth daily.    Historical Provider, MD  omeprazole (PRILOSEC) 20 MG capsule Take 20 mg by mouth daily.    Historical Provider, MD  sildenafil (VIAGRA) 100 MG tablet Take 1 tablet (100 mg total) by mouth daily as needed for erectile dysfunction. 04/16/14   Dorena Cookey, MD  simvastatin (ZOCOR) 20 MG tablet Take 20 mg by mouth daily.    Historical Provider, MD    Family History Family History  Problem Relation Age of Onset  . Diabetes Father   . Prostate cancer Father   . Heart attack Father 8  . Heart failure Mother     Congestive heart failure  . Cancer Brother     colon  . Colon cancer Brother 92  . Stomach cancer Sister   . Colon cancer Other   . Colon cancer Sister   . Coronary artery disease Brother     CABG  . Lung cancer Brother   . Anesthesia problems Neg Hx   . Esophageal cancer Neg Hx     Social History Social History  Substance Use Topics  . Smoking status: Former Smoker    Packs/day: 0.50    Years: 7.00    Types: Cigarettes    Quit date: 03/21/1961  . Smokeless tobacco: Never Used  . Alcohol use No     Allergies   Patient has no known allergies.   Review of Systems Review of Systems  Cardiovascular:       Lightheadedness.   Gastrointestinal: Positive for blood in stool. Negative for abdominal pain, nausea, rectal pain and vomiting.       Melena  All other systems reviewed and are  negative.    Physical Exam Updated Vital Signs BP 136/71 (BP Location: Left Arm)   Pulse 81   Temp 97.9 F (36.6 C) (Oral)   Resp 20   Ht 6\' 2"  (1.88 m)   Wt 182 lb (82.6 kg)   SpO2 97%   BMI 23.37 kg/m   Physical Exam  Constitutional: He is oriented to person, place, and time. He appears well-developed and well-nourished.  HENT:  Head: Normocephalic and atraumatic.  Cardiovascular: Normal rate.   Pulmonary/Chest: Effort normal.  Neurological: He is alert and oriented to person, place, and time.  Skin: Skin is warm  and dry.  Psychiatric: He has a normal mood and affect.  Nursing note and vitals reviewed.    ED Treatments / Results  DIAGNOSTIC STUDIES: Oxygen Saturation is 97 percent on room air which is normal by my interpretation.    COORDINATION OF CARE: 12:23 AM Discussed treatment plan with pt at bedside and pt agreed to plan.  Labs (all labs ordered are listed, but only abnormal results are displayed) Labs Reviewed  CBC - Abnormal; Notable for the following:       Result Value   RBC 3.90 (*)    Hemoglobin 11.3 (*)    HCT 32.4 (*)    Platelets 129 (*)    All other components within normal limits  COMPREHENSIVE METABOLIC PANEL  POC OCCULT BLOOD, ED  TYPE AND SCREEN    EKG  EKG Interpretation None       Radiology No results found.  Procedures Procedures (including critical care time)  Medications Ordered in ED Medications - No data to display   Initial Impression / Assessment and Plan / ED Course  I have reviewed the triage vital signs and the nursing notes.  Pertinent labs & imaging results that were available during my care of the patient were reviewed by me and considered in my medical decision making (see chart for details).  Clinical Course     Patient presents with bloody, maroon-colored stools. He feels lightheaded. He does have a 2 g drop in his hemoglobin. I've discussed this case with GI and the patient will be admitted to the  hospitalist service.  Final Clinical Impressions(s) / ED Diagnoses   Final diagnoses:  None    New Prescriptions New Prescriptions   No medications on file  I personally performed the services described in this documentation, which was scribed in my presence. The recorded information has been reviewed and is accurate.        Russell Speak, MD 02/28/16 [phone]

## 2016-02-25 NOTE — Progress Notes (Addendum)
Pt declined cpap tonight stating the mask is too uncomfortable.  Pt placed on 2lnc, HR93, rr13, spo2 100%.  Pt was advised that this RT is available all night should he change his mind.  RN aware.

## 2016-02-25 NOTE — Anesthesia Procedure Notes (Addendum)
Procedure Name: MAC Date/Time: 02/25/2016 2:11 PM Performed by: Dione Booze Pre-anesthesia Checklist: Patient identified, Suction available, Patient being monitored and Emergency Drugs available Patient Re-evaluated:Patient Re-evaluated prior to inductionOxygen Delivery Method: Nasal cannula Placement Confirmation: positive ETCO2

## 2016-02-25 NOTE — Op Note (Addendum)
[facility] Patient Name: [name] Procedure Date: 02/25/2016 MRN: [id number] Attending MD: Carlota Raspberry. Armbruster MD, MD Date of Birth: [birth year] CSN: [id number] Age: 76 Admit Type: Inpatient Procedure:                Upper GI endoscopy Indications:              Melena, history of noncirrhotic portal hypertensive                            with varices Providers:                Remo Lipps P. Armbruster MD, MD, Burtis Junes, RN, Cherylynn Ridges, Technician, Dione Booze, CRNA Referring MD:              Medicines:                Monitored Anesthesia Care Complications:            No immediate complications. Estimated blood loss:                            Minimal. Estimated Blood Loss:     Estimated blood loss was minimal. Procedure:                Pre-Anesthesia Assessment:                           - Prior to the procedure, a History and Physical                            was performed, and patient medications and                            allergies were reviewed. The patient's tolerance of                            previous anesthesia was also reviewed. The risks                            and benefits of the procedure and the sedation                            options and risks were discussed with the patient.                            All questions were answered, and informed consent                            was obtained. Prior Anticoagulants: The patient has                            taken no previous anticoagulant or antiplatelet  agents. ASA Grade Assessment: III - A patient with                            severe systemic disease. After reviewing the risks                            and benefits, the patient was deemed in                            satisfactory condition to undergo the procedure.                           After obtaining informed consent, the endoscope was                             passed under direct vision. Throughout the                            procedure, the patient's blood pressure, pulse, and                            oxygen saturations were monitored continuously. The                            [id number]-[id number] ([id number]) scope was introduced through the                            mouth, and advanced to the second part of duodenum.                            The upper GI endoscopy was accomplished without                            difficulty. The patient tolerated the procedure                            well. Scope In: Scope Out: Findings:      Esophagogastric landmarks were identified: the Z-line was found at 43       cm, the gastroesophageal junction was found at 43 cm and the upper       extent of the gastric folds was found at 43 cm from the incisors.      Multiple columns of large varices were found in the middle third of the       esophagus and in the lower third of the esophagus, one of which had a       red spot just proximal to the GEJ. No active bleeding was appreciated       during the exam. After the stomach was cleared, seven bands were       successfully placed. There was no bleeding at the end of the procedure.      Clotted and red blood was found in the entire examined stomach. The       proximal half of the stomach had very poor visualization initially due       to clot burden.  Once this was noted the patient was intubated by       anesthesia to protect the patient's airway. A therapeutic endoscope was       used to help suction clot, as well as a Roth net, which took       approximately one hour to clear the stomach to obtain adequate views and       ensure no other cause of bleeding.      Portal hypertensive gastritis was noted in the proximal half of the       stomach. The exam of the stomach was otherwise normal. No gastric       varices.      The duodenal bulb and second portion of the duodenum were normal other       than old  blood. Impression:               - Esophagogastric landmarks identified.                           - Large esophageal varices, one with red spot.                            Banded x 7, I suspect variceal bleed is the most                            likely cause of the patient's symptoms                           - Clotted and red blood in the entire stomach,                            cleared as outlined above.                           - Portal hypertensive gastritis                           - Normal duodenal bulb and second portion of the                            duodenum. Moderate Sedation:      No moderate sedation, case performed with MAC Recommendation:           - Return patient to ICU for ongoing care.                           - NPO for today.                           - Continue present medications including IV                            octreotide drip and IV protonix                           - Serial Hgb, monitor for recurrent bleeding                           -  GI service will continue to follow, please call                            with questions in the interim Procedure Code(s):        --- Professional ---                           [phone], Esophagogastroduodenoscopy, flexible,                            transoral; with band ligation of esophageal/gastric                            varices Diagnosis Code(s):        --- Professional ---                           I85.00, Esophageal varices without bleeding                           K92.2, Gastrointestinal hemorrhage, unspecified                           K92.1, Melena (includes Hematochezia) CPT copyright 2016 [employer]. All rights reserved. The codes documented in this report are preliminary and upon coder review may  be revised to meet current compliance requirements. Remo Lipps P. Armbruster MD, MD 02/25/2016 3:50:34 PM This report has been signed electronically. Number of Addenda: 0

## 2016-02-25 NOTE — Anesthesia Procedure Notes (Signed)
Procedure Name: Intubation Date/Time: 02/25/2016 2:47 PM Performed by: Dione Booze Pre-anesthesia Checklist: Emergency Drugs available, Suction available, Patient being monitored and Patient identified Patient Re-evaluated:Patient Re-evaluated prior to inductionOxygen Delivery Method: Circle system utilized Preoxygenation: Pre-oxygenation with 100% oxygen Intubation Type: IV induction, Cricoid Pressure applied and Rapid sequence Grade View: Grade II Tube type: Oral Tube size: 7.5 mm Number of attempts: 1 Airway Equipment and Method: Stylet Placement Confirmation: ETT inserted through vocal cords under direct vision,  positive ETCO2 and breath sounds checked- equal and bilateral Secured at: 21 cm Tube secured with: Tape Dental Injury: Teeth and Oropharynx as per pre-operative assessment

## 2016-02-25 NOTE — H&P (Signed)
History and Physical    [name] [id number] DOB: 05-Jun-1940 DOA: 02/25/2016  Referring MD/NP/PA: Dr. Stark Jock PCP: Geoffery Lyons, MD  Patient coming from: Home  Chief Complaint: Bloody stools  HPI: [name] is a 76 y.o. 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 Patient reports that he has had a total of 3 loose bowel movements in which blood is mixed in with the stool. Associated symptoms include generalized malaise, lightheadedness, or decreased appetite. Denies having any abdominal pain, nausea, vomiting, loss of consciousness, or falls. Patient sees Dr. Havery Moros of GI in the outpatient setting. Patient notes that his last colonoscopy was on 10/2015 in which multiple polyps were removed. His last EGD was from 10/2014 which showed grade 2-3 esophageal varices.   ED Course: Upon admission into the emergency department patient was seen have a hemoglobin of 11.3 (previously 13.1 just 1  week ago).  Stool guaiac was noted to be positive. Patient was given a dose of Protonix and gastroenterology was consulted.   Review of Systems: As per HPI otherwise 10 point review of systems negative.   Past Medical History:  Diagnosis Date  . Anemia   . Arthritis    "thumbs" (11/29/2013)  . Atrial fibrillation [facility]) 2006   Dr Tonny Bollman af-no meds  . colon ca dx'd 02/2011   COLON CANCER  . Diverticulosis    Sigmoid colon  . Esophageal stricture   . Esophageal varices determined by endoscopy ([place]) sept 2016  . Fracture of distal femur ([place]) 11/29/2013  . GERD (gastroesophageal reflux disease) 03-31-11   tx. Omeprazole  . Hearing loss    has bilateral hearing aids  . Hernia, abdominal    at the umbilicus  . History of hemorrhoids   .  History of irregular heartbeat   . Hypercholesterolemia   . Hyperlipidemia   . Neuropathy ([place]) 2013   as a result of chemo  . OSA on CPAP dx'd 2010   uses cap nightly  . Oxygen deficiency   . Personal history of colonic polyps 09/17/2009   Tubular adenoma  . Pneumonia 01/2013  . 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 Sleep apnea    uses c pap at home  . TBI (traumatic brain injury) ([place]) 1986   S/P fall; "slow to get going q morning" (10//22/2015)    Past Surgical History:  Procedure Laterality Date  . APPENDECTOMY  03/2011  . CARDIAC CATHETERIZATION  07/2002   Dr Wynonia Lawman  . COLON SURGERY  2013  . COLONOSCOPY  sept 2016   upper endo and colonoscopy  . ESOPHAGEAL DILATION  2006  . INGUINAL HERNIA REPAIR Left ~ 1991  . LAPAROSCOPIC PARTIAL COLECTOMY  04/08/2011  . ORIF DISTAL FEMUR FRACTURE Right 11/29/2013   "put plate in"  . ORIF FEMUR FRACTURE Right 11/29/2013   Procedure: OPEN REDUCTION INTERNAL FIXATION (ORIF) RIGHT  DISTAL FEMUR FRACTURE;  Surgeon: Rozanna Box, MD;  Location: [place];  Service: Orthopedics;  Laterality: Right;  . PORT-A-CATH REMOVAL  11/09/2011   Procedure: REMOVAL PORT-A-CATH;  Surgeon: Odis Hollingshead, MD;  Location: [place];  Service: General;  Laterality: N/A;  . PORTACATH PLACEMENT  04/30/2011   Procedure: INSERTION PORT-A-CATH;  Surgeon: Odis Hollingshead, MD;  Location: [facility] OR;  Service: General;  Laterality: Right;     reports that he quit smoking about 54 years ago. His smoking use included Cigarettes. He has a 3.50 pack-year smoking history. He has never used smokeless tobacco. He reports that he does not drink alcohol or use drugs.  No Known Allergies  Family History  Problem Relation Age of Onset  . Diabetes Father   . Prostate cancer Father   . Heart attack Father 64  . Heart failure Mother     Congestive heart failure  . Cancer Brother      colon  . Colon cancer Brother 58  . Stomach cancer Sister   . Colon cancer Other   . Colon cancer Sister   . Coronary artery disease Brother     CABG  . Lung cancer Brother   . Anesthesia problems Neg Hx   . Esophageal cancer Neg Hx     Prior to Admission medications   Medication Sig Start Date End Date Taking? Authorizing Provider  amphetamine-dextroamphetamine (ADDERALL) 10 MG tablet Take 30 mg by mouth every morning.  12/30/10  Yes Historical Provider, MD  ferrous sulfate 325 (65 FE) MG tablet Take 325 mg by mouth 2 (two) times daily.   Yes Historical Provider, MD  gabapentin (NEURONTIN) 100 MG capsule One by mouth twice a day 04/16/14  Yes Dorena Cookey, MD  metoprolol succinate (TOPROL-XL) 50 MG 24 hr tablet Take 50 mg by mouth daily. Take with or immediately following a meal.   Yes Historical Provider, MD  omeprazole (PRILOSEC) 20 MG capsule Take 20 mg by mouth daily.   Yes Historical Provider, MD  sildenafil (VIAGRA) 100 MG tablet Take 1 tablet (100 mg total) by mouth daily as needed for erectile dysfunction. 04/16/14  Yes Dorena Cookey, MD  simvastatin (ZOCOR) 20 MG tablet Take 20 mg by mouth daily.   Yes Historical Provider, MD    Physical Exam:  Constitutional: NAD, calm, comfortable Vitals:   02/25/16 0150 02/25/16 0238 02/25/16 0319 02/25/16 0359  BP: 131/65 (!) 102/46 (!) 115/53 109/57  Pulse: 79 81 84 80  Resp: 18 16 18 18   Temp:      TempSrc:      SpO2: 98% 97% 98% 96%  Weight:      Height:       Eyes: PERRL, lids and conjunctivae normal ENMT: Mucous membranes are moist. Posterior pharynx clear of any exudate or lesions.Normal dentition.  Neck: normal, supple, no masses, no thyromegaly Respiratory: clear to auscultation bilaterally, no wheezing, no crackles. Normal respiratory effort. No accessory muscle use.  Cardiovascular: Regular rate and rhythm, no murmurs / rubs / gallops. No extremity edema. 2+ pedal pulses. No carotid bruits.  Abdomen: no tenderness, no  masses palpated. No hepatosplenomegaly. Bowel sounds positive.  Musculoskeletal: no clubbing / cyanosis. No joint deformity upper and lower extremities. Good ROM, no contractures. Normal muscle tone.  Skin: no rashes, lesions, ulcers. No induration Neurologic: CN 2-12 grossly intact. Sensation intact, DTR normal. Strength 5/5 in all 4.  Psychiatric: Normal judgment and insight. Alert and oriented x 3. Normal mood.     Labs on Admission: I have personally reviewed following labs and imaging studies  CBC:  Recent Labs Lab 02/24/16 2328  WBC 7.4  HGB 11.3*  HCT 32.4*  MCV 83.1  PLT [id number]*   Basic Metabolic Panel:  Recent Labs Lab 02/24/16 2328  NA 138  K 4.5  CL 108  CO2 24  GLUCOSE 159*  BUN 25*  CREATININE 0.89  CALCIUM 8.8*   GFR: Estimated Creatinine Clearance: 84.7 mL/min (by C-G formula based on SCr of 0.89 mg/dL). Liver Function Tests:  Recent Labs Lab 02/24/16 2328  AST 21  ALT 24  ALKPHOS 73  BILITOT 1.9*  PROT 6.5  ALBUMIN 3.7   No results for input(s): LIPASE, AMYLASE in the last 168 hours. No results for input(s): AMMONIA in the last 168 hours. Coagulation Profile: No results for input(s): INR, PROTIME in the last 168 hours. Cardiac Enzymes: No results for input(s): CKTOTAL, CKMB, CKMBINDEX, TROPONINI in the last 168 hours. BNP (last 3 results) No results for input(s): PROBNP in the last 8760 hours. HbA1C: No results for input(s): HGBA1C in the last 72 hours. CBG: No results for input(s): GLUCAP in the last 168 hours. Lipid Profile: No results for input(s): CHOL, HDL, LDLCALC, TRIG, CHOLHDL, LDLDIRECT in the last 72 hours. Thyroid Function Tests: No results for input(s): TSH, T4TOTAL, FREET4, T3FREE, THYROIDAB in the last 72 hours. Anemia Panel: No results for input(s): VITAMINB12, FOLATE, FERRITIN, TIBC, IRON, RETICCTPCT in the last 72 hours. Urine analysis:    Component Value Date/Time   COLORURINE yellow 12/24/2009 0927   APPEARANCEUR  Clear 12/24/2009 0927   LABSPEC 1.010 03/15/2010 1806   PHURINE 7.0 03/15/2010 1806   HGBUR NEGATIVE 03/15/2010 1806   HGBUR negative 12/24/2009 0927   BILIRUBINUR n 04/10/2014 1011   KETONESUR NEGATIVE 03/15/2010 1806   PROTEINUR n 04/10/2014 1011   PROTEINUR NEGATIVE 03/15/2010 1806   UROBILINOGEN 0.2 04/10/2014 1011   UROBILINOGEN 0.2 03/15/2010 1806   NITRITE n 04/10/2014 1011   NITRITE NEGATIVE 03/15/2010 1806   LEUKOCYTESUR Negative 04/10/2014 1011   Sepsis Labs: No results found for this or any previous visit (from the past 240 hour(s)).   Radiological Exams on Admission: No results found.    Assessment/Plan GI bleed with acute blood loss anemia: Acute. Patient reports one-day history of bloody stools. Patient with a 2 g/dL drop in hemoglobin in the last week. Stool guaiac positive for blood. Question if bleeding from varices.  - Admit to a telemetry bed  - NPO - IVF at 75 ml/hr - Recheck CBC - T&S for possible need of blood products - Transfuse if hemoglobin drops below 8 - Protonix IV - Gastroenterology consulted and to see in a.m.   History of colon cancer s/p resection with oxaliplatin induced sinusoidal sclerosis and esophageal varices: Patient notes that this is a reason from his portal hypertension with grade 2-3 esophageal varices based on last EGD in 10/2014.  - Per GI  Rosanna Randy syndrome  Thrombocytopenia: chronic. Patient with a platelet count of 129 - Continue to monitor  DVT prophylaxis: scd    Code Status: Full Family Communication: Discussed plan of care with the patient and his wife was present at bedside  Disposition Plan: Likely discharge home once clinically stable  Consults called: GI Admission status: inpatien  Norval Morton MD [facility] Pager [phone]  If 7PM-7AM, please contact night-coverage [email or web address] Password [email or web address]  02/25/2016, 4:12 AM

## 2016-02-25 NOTE — Anesthesia Preprocedure Evaluation (Signed)
Anesthesia Evaluation  Patient identified by MRN, date of birth, ID band Patient awake    Reviewed: Allergy & Precautions, NPO status , Patient's Chart, lab work & pertinent test results  Airway Mallampati: II  TM Distance: >3 FB Neck ROM: Full    Dental no notable dental hx.    Pulmonary sleep apnea and Continuous Positive Airway Pressure Ventilation , former smoker,    Pulmonary exam normal breath sounds clear to auscultation       Cardiovascular + CAD  Normal cardiovascular exam+ dysrhythmias Atrial Fibrillation  Rhythm:Regular Rate:Normal     Neuro/Psych negative neurological ROS  negative psych ROS   GI/Hepatic negative GI ROS, Portal HTN with varices   Endo/Other  negative endocrine ROS  Renal/GU negative Renal ROS  negative genitourinary   Musculoskeletal negative musculoskeletal ROS (+)   Abdominal   Peds negative pediatric ROS (+)  Hematology negative hematology ROS (+) Low platelets    Anesthesia Other Findings   Reproductive/Obstetrics negative OB ROS                             Anesthesia Physical Anesthesia Plan  ASA: III  Anesthesia Plan: MAC   Post-op Pain Management:    Induction:   Airway Management Planned: Simple Face Mask  Additional Equipment:   Intra-op Plan:   Post-operative Plan:   Informed Consent: I have reviewed the patients History and Physical, chart, labs and discussed the procedure including the risks, benefits and alternatives for the proposed anesthesia with the patient or authorized representative who has indicated his/her understanding and acceptance.   Dental advisory given  Plan Discussed with: CRNA  Anesthesia Plan Comments:         Anesthesia Quick Evaluation

## 2016-02-26 ENCOUNTER — Encounter (HOSPITAL_COMMUNITY): Payer: Self-pay | Admitting: Gastroenterology

## 2016-02-26 DIAGNOSIS — Z8719 Personal history of other diseases of the digestive system: Secondary | ICD-10-CM

## 2016-02-26 DIAGNOSIS — K766 Portal hypertension: Secondary | ICD-10-CM

## 2016-02-26 DIAGNOSIS — R0602 Shortness of breath: Secondary | ICD-10-CM

## 2016-02-26 DIAGNOSIS — K922 Gastrointestinal hemorrhage, unspecified: Secondary | ICD-10-CM

## 2016-02-26 DIAGNOSIS — Z85038 Personal history of other malignant neoplasm of large intestine: Secondary | ICD-10-CM

## 2016-02-26 LAB — CBC
HCT: 21.5 % — ABNORMAL LOW (ref 39.0–52.0)
HEMOGLOBIN: 7.8 g/dL — AB (ref 13.0–17.0)
MCH: 31 pg (ref 26.0–34.0)
MCHC: 36.3 g/dL — ABNORMAL HIGH (ref 30.0–36.0)
MCV: 85.3 fL (ref 78.0–100.0)
Platelets: 96 10*3/uL — ABNORMAL LOW (ref 150–400)
RBC: 2.52 MIL/uL — ABNORMAL LOW (ref 4.22–5.81)
RDW: 15.5 % (ref 11.5–15.5)
WBC: 6.2 10*3/uL (ref 4.0–10.5)

## 2016-02-26 LAB — COMPREHENSIVE METABOLIC PANEL
ALK PHOS: 47 U/L (ref 38–126)
ALT: 18 U/L (ref 17–63)
AST: 18 U/L (ref 15–41)
Albumin: 2.9 g/dL — ABNORMAL LOW (ref 3.5–5.0)
Anion gap: 5 (ref 5–15)
BUN: 33 mg/dL — AB (ref 6–20)
CALCIUM: 8 mg/dL — AB (ref 8.9–10.3)
CO2: 22 mmol/L (ref 22–32)
CREATININE: 1.02 mg/dL (ref 0.61–1.24)
Chloride: 113 mmol/L — ABNORMAL HIGH (ref 101–111)
Glucose, Bld: 159 mg/dL — ABNORMAL HIGH (ref 65–99)
Potassium: 4.1 mmol/L (ref 3.5–5.1)
Sodium: 140 mmol/L (ref 135–145)
Total Bilirubin: 1.5 mg/dL — ABNORMAL HIGH (ref 0.3–1.2)
Total Protein: 4.8 g/dL — ABNORMAL LOW (ref 6.5–8.1)

## 2016-02-26 LAB — HEMOGLOBIN AND HEMATOCRIT, BLOOD
HCT: 19.8 % — ABNORMAL LOW (ref 39.0–52.0)
HEMOGLOBIN: 7 g/dL — AB (ref 13.0–17.0)

## 2016-02-26 LAB — PREPARE RBC (CROSSMATCH)

## 2016-02-26 MED ORDER — DEXTROSE 5 % IV SOLN
2.0000 g | Freq: Every day | INTRAVENOUS | Status: DC
  Administered 2016-02-27 – 2016-02-29 (×3): 2 g via INTRAVENOUS
  Filled 2016-02-26 (×3): qty 2

## 2016-02-26 MED ORDER — SODIUM CHLORIDE 0.9 % IV SOLN
Freq: Once | INTRAVENOUS | Status: AC
  Administered 2016-02-26: 16:00:00 via INTRAVENOUS

## 2016-02-26 NOTE — Progress Notes (Signed)
Patient refused CPAP use for the night, stating that he cannot tolerate it to sleep. RT encouraged patient to inform RN if he changes his mind at any time. RT will continue to monitor as needed.

## 2016-02-26 NOTE — Progress Notes (Signed)
PROGRESS NOTE    [name]  [id number] DOB: 1940/05/11 DOA: 02/25/2016 PCP: Geoffery Lyons, MD    Brief Narrative:  76 y.o. 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 Patient reports that he has had a total of 3 loose bowel movements in which blood is mixed in with the stool. Associated symptoms include generalized malaise, lightheadedness, or decreased appetite. Denies having any abdominal pain, nausea, vomiting, loss of consciousness, or falls. Patient sees Dr. Havery Moros of GI in the outpatient setting. Patient notes that his last colonoscopy was on 10/2015 in which multiple polyps were removed. His last EGD was from 10/2014 which showed grade 2-3 esophageal varices  Upon admission into the emergency department patient was seen have a hemoglobin of 11.3 (previously 13.1 just 1  week ago).  Stool guaiac was noted to be positive. Patient was given a dose of Protonix and gastroenterology was consulted.   Assessment & Plan:   Principal Problem:   Upper GI bleed Active Problems:   Disorder of bilirubin excretion   History of colon cancer   Thrombocytopenia (HCC)   History of esophageal varices  GI bleed with acute blood loss anemia:  - Patient presented with one-day history of bloody stools.  - Patient with 2 gm drop in hemoglobin over the past week - Stools noted to be guaiac positive at time of admission.  - GI consulted. Patient underwent EGD on 1/17 with findings of large esophageal varices s/p banding x 7 which is suspected to be source of bleed. Also clotted red blood in stomach with portal hypertensive gastritis noted. -Hgb down to 7.0 this afternoon. Blood transfusion ordered by GI, pending -Will check CBC in AM   History  of colon cancer s/p resection with oxaliplatin induced sinusoidal sclerosis and esophageal varices -Per above, large esophageal varices noted on EGD s/p banding -Repeat CBC in AM  Gilbert syndrome -Remains stable -Will repeat CBC in AM  Thrombocytopenia: chronic.  - Platelets down to 96 this AM - Will follow CBC in AM  DVT prophylaxis: SCD's Code Status: Full Family Communication: Pt in room, wife at bedside Disposition Plan: Uncertain at this time  Consultants:   GI  Procedures:     Antimicrobials: Anti-infectives    Start     Dose/Rate Route Frequency Ordered Stop   02/27/16 1000  cefTRIAXone (ROCEPHIN) 2 g in dextrose 5 % 50 mL IVPB     2 g 100 mL/hr over 30 Minutes Intravenous Daily 02/26/16 1218     02/25/16 1030  cefTRIAXone (ROCEPHIN) 1 g in dextrose 5 % 50 mL IVPB  Status:  Discontinued     1 g 100 mL/hr over 30 Minutes Intravenous Daily 02/25/16 1000 02/26/16 1218      Subjective: Without complaints  Objective: Vitals:   02/26/16 1500 02/26/16 1555 02/26/16 1600 02/26/16 1615  BP: (!) 106/52 (!) 102/46 (!) 113/51 (!) 101/56  Pulse: 77 95 91 90  Resp: 16 19 14 15   Temp:  98.7 F (37.1 C)  98.8 F (37.1 C)  TempSrc:  Oral  Oral  SpO2: 100% 99% 99% 98%  Weight:      Height:        Intake/Output Summary (Last 24 hours) at 02/26/16 1621 Last data filed at 02/26/16 1600  Gross per 24 hour  Intake  3225.83 ml  Output              750 ml  Net          2475.83 ml   Filed Weights   02/24/16 2312 02/25/16 1347  Weight: 82.6 kg (182 lb) 82.6 kg (182 lb)    Examination:  General exam: Awake, in nad  Respiratory system: normal resp effort, no audible wheezing Cardiovascular system: regular rate, s1, s2 Gastrointestinal system: soft, nondistended, pos BS Central nervous system: cn2-12 grossly intact, strength intact Extremities: perfused, no clubbing Skin: normal skin turgor, no notable skin lesions seen  Psychiatry: mood normal// no  visual hallucinations   Data Reviewed: I have personally reviewed following labs and imaging studies  CBC:  Recent Labs Lab 02/24/16 2328 02/25/16 0455 02/25/16 1120 02/26/16 0332 02/26/16 1121  WBC 7.4 6.3  --  6.2  --   HGB 11.3* 10.7* 9.6* 7.8* 7.0*  HCT 32.4* 30.1* 27.3* 21.5* 19.8*  MCV 83.1 84.6  --  85.3  --   PLT 129* 102*  --  96*  --    Basic Metabolic Panel:  Recent Labs Lab 02/24/16 2328 02/25/16 0455 02/26/16 0332  NA 138 139 140  K 4.5 4.6 4.1  CL 108 110 113*  CO2 24 22 22   GLUCOSE 159* 151* 159*  BUN 25* 35* 33*  CREATININE 0.89 0.78 1.02  CALCIUM 8.8* 8.8* 8.0*   GFR: Estimated Creatinine Clearance: 73.9 mL/min (by C-G formula based on SCr of 1.02 mg/dL). Liver Function Tests:  Recent Labs Lab 02/24/16 2328 02/25/16 0455 02/26/16 0332  AST 21 22 18   ALT 24 22 18   ALKPHOS 73 64 47  BILITOT 1.9* 2.3* 1.5*  PROT 6.5 6.2* 4.8*  ALBUMIN 3.7 3.5 2.9*   No results for input(s): LIPASE, AMYLASE in the last 168 hours. No results for input(s): AMMONIA in the last 168 hours. Coagulation Profile:  Recent Labs Lab 02/25/16 1125  INR 1.47   Cardiac Enzymes: No results for input(s): CKTOTAL, CKMB, CKMBINDEX, TROPONINI in the last 168 hours. BNP (last 3 results) No results for input(s): PROBNP in the last 8760 hours. HbA1C: No results for input(s): HGBA1C in the last 72 hours. CBG: No results for input(s): GLUCAP in the last 168 hours. Lipid Profile: No results for input(s): CHOL, HDL, LDLCALC, TRIG, CHOLHDL, LDLDIRECT in the last 72 hours. Thyroid Function Tests: No results for input(s): TSH, T4TOTAL, FREET4, T3FREE, THYROIDAB in the last 72 hours. Anemia Panel: No results for input(s): VITAMINB12, FOLATE, FERRITIN, TIBC, IRON, RETICCTPCT in the last 72 hours. Sepsis Labs: No results for input(s): PROCALCITON, LATICACIDVEN in the last 168 hours.  Recent Results (from the past 240 hour(s))  MRSA PCR Screening     Status: None    Collection Time: 02/25/16  4:39 PM  Result Value Ref Range Status   MRSA by PCR NEGATIVE NEGATIVE Final    Comment:        The GeneXpert MRSA Assay (FDA approved for NASAL specimens only), is one component of a comprehensive MRSA colonization surveillance program. It is not intended to diagnose MRSA infection nor to guide or monitor treatment for MRSA infections.      Radiology Studies: Dg Chest 1 View  Result Date: 02/25/2016 CLINICAL DATA:  Shortness of breath, RIGHT chest pain. Stage III colon cancer, atrial fibrillation. EXAM: CHEST 1 VIEW COMPARISON:  CT chest October 13, 2014 FINDINGS: The heart size and mediastinal contours are within normal limits. Skin fold projecting RIGHT lung  apex. Both lungs are clear. Mild bronchitic changes. The visualized skeletal structures are unremarkable. IMPRESSION: Mild bronchitic changes without focal consolidation. Electronically Signed   By: Elon Alas M.D.   On: 02/25/2016 19:03    Scheduled Meds: . [START ON 02/27/2016] cefTRIAXone (ROCEPHIN)  IV  2 g Intravenous Daily  . gabapentin  100 mg Oral BID   Continuous Infusions: . sodium chloride 75 mL/hr at 02/26/16 1600  . octreotide  (SANDOSTATIN)    IV infusion 50 mcg/hr (02/26/16 1600)  . pantoprozole (PROTONIX) infusion 8 mg/hr (02/26/16 1600)     LOS: 1 day   Delaney Schnick, Orpah Melter, MD [facility] Pager [phone]  If 7PM-7AM, please contact night-coverage [email or web address] Password [email or web address] 02/26/2016, 4:21 PM

## 2016-02-26 NOTE — Progress Notes (Signed)
Progress Note   Subjective  EGD done with variceal banding yesterday afternoon. He denies any vomiting or passing any stools since the procedure. Hgb downtrended overnight to 7s. He had some R chest / RUQ discomfort last night, controlled with pain meds. CXR without etiology. He reports sleeping poorly, otherwise no complaints this morning.   Objective   Vital signs in last 24 hours: Temp:  [98 F (36.7 C)-98.6 F (37 C)] 98.5 F (36.9 C) (01/18 0000) Pulse Rate:  [60-99] 77 (01/18 0700) Resp:  [10-23] 17 (01/18 0700) BP: (94-134)/(43-89) 97/49 (01/18 0600) SpO2:  [83 %-100 %] 98 % (01/18 0730) Weight:  [182 lb (82.6 kg)] 182 lb (82.6 kg) (01/17 1347) Last BM Date: 02/25/16 General:    white male in NAD Heart:  Regular rate and rhythm;  Lungs: Respirations even and unlabored, lungs CTA bilaterally Abdomen:  Soft, nontender and nondistended.  Extremities:  Without edema. Neurologic:  Alert and oriented,  grossly normal neurologically. Psych:  Cooperative. Normal mood and affect.  Intake/Output from previous day: 01/17 0701 - 01/18 0700 In: 3365.8 [I.V.:3315.8; IV Piggyback:50] Out: 850 [Urine:750; Blood:100] Intake/Output this shift: Total I/O In: 25 [I.V.:25] Out: -   Lab Results:  Recent Labs  02/24/16 2328 02/25/16 0455 02/25/16 1120 02/26/16 0332  WBC 7.4 6.3  --  6.2  HGB 11.3* 10.7* 9.6* 7.8*  HCT 32.4* 30.1* 27.3* 21.5*  PLT 129* 102*  --  96*   BMET  Recent Labs  02/24/16 2328 02/25/16 0455 02/26/16 0332  NA 138 139 140  K 4.5 4.6 4.1  CL 108 110 113*  CO2 24 22 22   GLUCOSE 159* 151* 159*  BUN 25* 35* 33*  CREATININE 0.89 0.78 1.02  CALCIUM 8.8* 8.8* 8.0*   LFT  Recent Labs  02/26/16 0332  PROT 4.8*  ALBUMIN 2.9*  AST 18  ALT 18  ALKPHOS 47  BILITOT 1.5*   PT/INR  Recent Labs  02/25/16 1125  LABPROT 18.0*  INR 1.47    Studies/Results: Dg Chest 1 View  Result Date: 02/25/2016 CLINICAL DATA:  Shortness of breath,  RIGHT chest pain. Stage III colon cancer, atrial fibrillation. EXAM: CHEST 1 VIEW COMPARISON:  CT chest October 13, 2014 FINDINGS: The heart size and mediastinal contours are within normal limits. Skin fold projecting RIGHT lung apex. Both lungs are clear. Mild bronchitic changes. The visualized skeletal structures are unremarkable. IMPRESSION: Mild bronchitic changes without focal consolidation. Electronically Signed   By: Elon Alas M.D.   On: 02/25/2016 19:03       Assessment / Plan:   76 y/o male with history of noncirrhotic portal hypertension due to oxaliplatin induced sinusoidal sclerosis, with esophageal varices, who presented with an upper GI bleed. He had a prolonged EGD yesterday due to significant clot burden in the stomach which needed to be cleared, required intubation for a period of time - overall, suspect he had variceal bleeding, had large varices with red spots, 7 bands placed, he also had portal hypertensive gastritis. It's possible his chest discomfort is due to banding which is not uncommon. While Hgb has downtrended, BUN also downtrending and no further blood per rectum.  At this time recommend the following: - repeat Hgb later this AM, transfuse PRBC for Hgb < 7 - if CBC stable and no evidence of bleeding can give clear liquids later today. Please hold oral pills if not necessary until tolerating PO - continue IV octreotide drip and IV protonix drip at  this time - please contact me with any evidence of rebleeding - once recovered from this acute bleed, he will need to be back on nonselective beta blocker - he will warrant repeat EGD in a few weeks with further banding as needed  Please call with questions.  Tusculum Cellar, MD [facility] Pager [phone]

## 2016-02-26 NOTE — Anesthesia Postprocedure Evaluation (Signed)
Anesthesia Post Note  Patient: [name]  Procedure(s) Performed: Procedure(s) (LRB): ESOPHAGOGASTRODUODENOSCOPY (EGD) WITH PROPOFOL (N/A)  Patient location during evaluation: Endoscopy Anesthesia Type: General Level of consciousness: awake and alert Pain management: pain level controlled Vital Signs Assessment: post-procedure vital signs reviewed and stable Respiratory status: spontaneous breathing, nonlabored ventilation, respiratory function stable and patient connected to nasal cannula oxygen Cardiovascular status: blood pressure returned to baseline and stable Postop Assessment: no signs of nausea or vomiting Anesthetic complications: no        Last Vitals:  Vitals:   02/26/16 0200 02/26/16 0600  BP: (!) 116/51 (!) 97/49  Pulse: 99 94  Resp: 16 (!) 23  Temp:      Last Pain:  Vitals:   02/26/16 0430  TempSrc:   PainSc: Asleep   Pain Goal: Patients Stated Pain Goal: 0 (02/26/16 0350)               [name]

## 2016-02-26 NOTE — Progress Notes (Signed)
Blood not ready yet for transfusion. Will continue to check with blood bank.

## 2016-02-27 DIAGNOSIS — D696 Thrombocytopenia, unspecified: Secondary | ICD-10-CM

## 2016-02-27 LAB — COMPREHENSIVE METABOLIC PANEL
ALBUMIN: 2.9 g/dL — AB (ref 3.5–5.0)
ALT: 19 U/L (ref 17–63)
AST: 17 U/L (ref 15–41)
Alkaline Phosphatase: 44 U/L (ref 38–126)
Anion gap: 4 — ABNORMAL LOW (ref 5–15)
BILIRUBIN TOTAL: 1 mg/dL (ref 0.3–1.2)
BUN: 28 mg/dL — AB (ref 6–20)
CO2: 24 mmol/L (ref 22–32)
CREATININE: 1.09 mg/dL (ref 0.61–1.24)
Calcium: 8 mg/dL — ABNORMAL LOW (ref 8.9–10.3)
Chloride: 113 mmol/L — ABNORMAL HIGH (ref 101–111)
GFR calc Af Amer: >60 mL/min (ref >60)
GLUCOSE: 168 mg/dL — AB (ref 65–99)
Potassium: 3.7 mmol/L (ref 3.5–5.1)
Sodium: 141 mmol/L (ref 135–145)
TOTAL PROTEIN: 4.9 g/dL — AB (ref 6.5–8.1)

## 2016-02-27 LAB — HEMOGLOBIN AND HEMATOCRIT, BLOOD
HEMATOCRIT: 22.8 % — AB (ref 39.0–52.0)
Hemoglobin: 8.1 g/dL — ABNORMAL LOW (ref 13.0–17.0)

## 2016-02-27 LAB — CBC
HCT: 20.8 % — ABNORMAL LOW (ref 39.0–52.0)
Hemoglobin: 7.4 g/dL — ABNORMAL LOW (ref 13.0–17.0)
MCH: 30 pg (ref 26.0–34.0)
MCHC: 35.6 g/dL (ref 30.0–36.0)
MCV: 84.2 fL (ref 78.0–100.0)
PLATELETS: 83 10*3/uL — AB (ref 150–400)
RBC: 2.47 MIL/uL — ABNORMAL LOW (ref 4.22–5.81)
RDW: 14.7 % (ref 11.5–15.5)
WBC: 2.9 10*3/uL — AB (ref 4.0–10.5)

## 2016-02-27 NOTE — Plan of Care (Signed)
Problem: Safety: Goal: Ability to remain free from injury will improve Outcome: Completed/Met Date Met: 02/27/16 Bed alarm set. Discussed safety plan, pt and spouse are aware

## 2016-02-27 NOTE — Progress Notes (Signed)
PROGRESS NOTE    [name]  [id number] DOB: 04-21-1940 DOA: 02/25/2016 PCP: Geoffery Lyons, MD    Brief Narrative:  76 y.o. 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 Patient reports that he has had a total of 3 loose bowel movements in which blood is mixed in with the stool. Associated symptoms include generalized malaise, lightheadedness, or decreased appetite. Denies having any abdominal pain, nausea, vomiting, loss of consciousness, or falls. Patient sees Dr. Havery Moros of GI in the outpatient setting. Patient notes that his last colonoscopy was on 10/2015 in which multiple polyps were removed. His last EGD was from 10/2014 which showed grade 2-3 esophageal varices  Upon admission into the emergency department patient was seen have a hemoglobin of 11.3 (previously 13.1 just 1  week ago).  Stool guaiac was noted to be positive. Patient was given a dose of Protonix and gastroenterology was consulted.   Assessment & Plan:   Principal Problem:   Upper GI bleed Active Problems:   Disorder of bilirubin excretion   History of colon cancer   Thrombocytopenia (HCC)   History of esophageal varices   Acute GI bleeding   Shortness of breath at rest  GI bleed with acute blood loss anemia:  - Patient presented with one-day history of bloody stools.  - Patient with 2 gm drop in hemoglobin over the past week - Stools noted to be guaiac positive at time of admission.  - GI consulted. Patient underwent EGD on 1/17 with findings of large esophageal varices s/p banding x 7 which is suspected to be source of bleed. Also clotted red blood in stomach with portal hypertensive gastritis noted. -Hgb down to 7.0 on 1/18 and patient received one unit of  PRBC's -repeat hgb of 7.4. Repeat hgb this afternoon reviewed of 8.1 -Patient reports feeling better today - Repeat CBC in AM   History of colon cancer s/p resection with oxaliplatin induced sinusoidal sclerosis and esophageal varices -Per above, large esophageal varices noted on EGD s/p banding -will recheck CBC in AM  Gilbert syndrome -Remains stable -Plan to recheck CBC in AM  Thrombocytopenia: chronic.  - Platelets down to 83 today - Repeat CBC in AM  DVT prophylaxis: SCD's Code Status: Full Family Communication: Pt in room, wife at bedside Disposition Plan: Uncertain at this time  Consultants:   GI  Procedures:     Antimicrobials: Anti-infectives    Start     Dose/Rate Route Frequency Ordered Stop   02/27/16 1000  cefTRIAXone (ROCEPHIN) 2 g in dextrose 5 % 50 mL IVPB     2 g 100 mL/hr over 30 Minutes Intravenous Daily 02/26/16 1218     02/25/16 1030  cefTRIAXone (ROCEPHIN) 1 g in dextrose 5 % 50 mL IVPB  Status:  Discontinued     1 g 100 mL/hr over 30 Minutes Intravenous Daily 02/25/16 1000 02/26/16 1218      Subjective: Reports feeling better today  Objective: Vitals:   02/26/16 1840 02/26/16 2033 02/27/16 0658 02/27/16 1250  BP: (!) 109/49 (!) 126/48 (!) 113/48 (!) 117/52  Pulse: 95 95 88 87  Resp: 18 18 18 18   Temp: 99 F (37.2 C) 98.2 F (36.8 C) 98.3 F (36.8 C) 98.5 F (36.9 C)  TempSrc: Oral Oral Oral Oral  SpO2: 94% 100% 94%  97%  Weight:      Height:        Intake/Output Summary (Last 24 hours) at 02/27/16 1530 Last data filed at 02/27/16 0600  Gross per 24 hour  Intake             2820 ml  Output                0 ml  Net             2820 ml   Filed Weights   02/24/16 2312 02/25/16 1347 02/26/16 1808  Weight: 82.6 kg (182 lb) 82.6 kg (182 lb) 87 kg (191 lb 12.8 oz)    Examination:  General exam: Laying in bed, in nad Respiratory system: normal chest rise, CTA, no audible wheezing Cardiovascular system: regular rhythm, s1-s2  on auscultation Gastrointestinal system: pos bs, soft, nontender Central nervous system: no seizures, no tremors Extremities: no cyanosis, no joint deformities Skin: no rashes, no pallor  Psychiatry: affect normal// no auditory hallucinations   Data Reviewed: I have personally reviewed following labs and imaging studies  CBC:  Recent Labs Lab 02/24/16 2328 02/25/16 0455 02/25/16 1120 02/26/16 0332 02/26/16 1121 02/27/16 0511 02/27/16 1258  WBC 7.4 6.3  --  6.2  --  2.9*  --   HGB 11.3* 10.7* 9.6* 7.8* 7.0* 7.4* 8.1*  HCT 32.4* 30.1* 27.3* 21.5* 19.8* 20.8* 22.8*  MCV 83.1 84.6  --  85.3  --  84.2  --   PLT 129* 102*  --  96*  --  83*  --    Basic Metabolic Panel:  Recent Labs Lab 02/24/16 2328 02/25/16 0455 02/26/16 0332 02/27/16 0511  NA 138 139 140 141  K 4.5 4.6 4.1 3.7  CL 108 110 113* 113*  CO2 24 22 22 24   GLUCOSE 159* 151* 159* 168*  BUN 25* 35* 33* 28*  CREATININE 0.89 0.78 1.02 1.09  CALCIUM 8.8* 8.8* 8.0* 8.0*   GFR: Estimated Creatinine Clearance: 69.1 mL/min (by C-G formula based on SCr of 1.09 mg/dL). Liver Function Tests:  Recent Labs Lab 02/24/16 2328 02/25/16 0455 02/26/16 0332 02/27/16 0511  AST 21 22 18 17   ALT 24 22 18 19   ALKPHOS 73 64 47 44  BILITOT 1.9* 2.3* 1.5* 1.0  PROT 6.5 6.2* 4.8* 4.9*  ALBUMIN 3.7 3.5 2.9* 2.9*   No results for input(s): LIPASE, AMYLASE in the last 168 hours. No results for input(s): AMMONIA in the last 168 hours. Coagulation Profile:  Recent Labs Lab 02/25/16 1125  INR 1.47   Cardiac Enzymes: No results for input(s): CKTOTAL, CKMB, CKMBINDEX, TROPONINI in the last 168 hours. BNP (last 3 results) No results for input(s): PROBNP in the last 8760 hours. HbA1C: No results for input(s): HGBA1C in the last 72 hours. CBG: No results for input(s): GLUCAP in the last 168 hours. Lipid Profile: No results for input(s): CHOL, HDL, LDLCALC, TRIG, CHOLHDL, LDLDIRECT in the last 72 hours. Thyroid Function  Tests: No results for input(s): TSH, T4TOTAL, FREET4, T3FREE, THYROIDAB in the last 72 hours. Anemia Panel: No results for input(s): VITAMINB12, FOLATE, FERRITIN, TIBC, IRON, RETICCTPCT in the last 72 hours. Sepsis Labs: No results for input(s): PROCALCITON, LATICACIDVEN in the last 168 hours.  Recent Results (from the past 240 hour(s))  MRSA PCR Screening     Status: None   Collection Time: 02/25/16  4:39 PM  Result Value Ref Range Status   MRSA by PCR NEGATIVE NEGATIVE Final    Comment:  The GeneXpert MRSA Assay (FDA approved for NASAL specimens only), is one component of a comprehensive MRSA colonization surveillance program. It is not intended to diagnose MRSA infection nor to guide or monitor treatment for MRSA infections.      Radiology Studies: Dg Chest 1 View  Result Date: 02/25/2016 CLINICAL DATA:  Shortness of breath, RIGHT chest pain. Stage III colon cancer, atrial fibrillation. EXAM: CHEST 1 VIEW COMPARISON:  CT chest October 13, 2014 FINDINGS: The heart size and mediastinal contours are within normal limits. Skin fold projecting RIGHT lung apex. Both lungs are clear. Mild bronchitic changes. The visualized skeletal structures are unremarkable. IMPRESSION: Mild bronchitic changes without focal consolidation. Electronically Signed   By: Elon Alas M.D.   On: 02/25/2016 19:03    Scheduled Meds: . cefTRIAXone (ROCEPHIN)  IV  2 g Intravenous Daily  . gabapentin  100 mg Oral BID   Continuous Infusions: . sodium chloride 75 mL/hr at 02/26/16 1600  . octreotide  (SANDOSTATIN)    IV infusion 50 mcg/hr (02/27/16 1451)  . pantoprozole (PROTONIX) infusion 8 mg/hr (02/27/16 1451)     LOS: 2 days   Ottilia Pippenger, Orpah Melter, MD [facility] Pager [phone]  If 7PM-7AM, please contact night-coverage [email or web address] Password [email or web address] 02/27/2016, 3:30 PM

## 2016-02-27 NOTE — Progress Notes (Signed)
Pt brought home CPAP mask for use tonight.  Pt states that he will self administer CPAP when ready for bed.  Pt to notify RT if assistance is required.  RT to monitor and assess as needed.

## 2016-02-27 NOTE — Progress Notes (Signed)
[facility] Progress Note  CC:  UGI bleed  Subjective:  Feels well.  No BM's or sign of bleeding since procedure on 1/17.  Reports a little discomfort in his chest from the bands, but very tolerable.  Objective:  Vital signs in last 24 hours: Temp:  [98 F (36.7 C)-99 F (37.2 C)] 98.3 F (36.8 C) (01/19 0658) Pulse Rate:  [77-101] 88 (01/19 0658) Resp:  [14-19] 18 (01/19 0658) BP: (89-126)/(22-58) 113/48 (01/19 0658) SpO2:  [92 %-100 %] 94 % (01/19 0658) Weight:  [191 lb 12.8 oz (87 kg)] 191 lb 12.8 oz (87 kg) (01/18 1808) Last BM Date: 02/26/16 General:  Alert, Well-developed, in NAD Heart:  Regular rate and rhythm Pulm:  CTAB.  No increased WOB. Abdomen:  Soft, non-distended.  BS present.  Non-tender. Extremities:  Without edema. Neurologic:  Alert and oriented x 4;  grossly normal neurologically. Psych:  Alert and cooperative. Normal mood and affect.  Intake/Output from previous day: 01/18 0701 - 01/19 0700 In: 2845 [I.V.:2175; Blood:670] Out: -  Intake/Output this shift: No intake/output data recorded.  Lab Results:  Recent Labs  02/25/16 0455  02/26/16 0332 02/26/16 1121 02/27/16 0511  WBC 6.3  --  6.2  --  2.9*  HGB 10.7*  < > 7.8* 7.0* 7.4*  HCT 30.1*  < > 21.5* 19.8* 20.8*  PLT 102*  --  96*  --  83*  < > = values in this interval not displayed. BMET  Recent Labs  02/25/16 0455 02/26/16 0332 02/27/16 0511  NA 139 140 141  K 4.6 4.1 3.7  CL 110 113* 113*  CO2 22 22 24   GLUCOSE 151* 159* 168*  BUN 35* 33* 28*  CREATININE 0.78 1.02 1.09  CALCIUM 8.8* 8.0* 8.0*   LFT  Recent Labs  02/27/16 0511  PROT 4.9*  ALBUMIN 2.9*  AST 17  ALT 19  ALKPHOS 44  BILITOT 1.0   PT/INR  Recent Labs  02/25/16 1125  LABPROT 18.0*  INR 1.47   Dg Chest 1 View  Result Date: 02/25/2016 CLINICAL DATA:  Shortness of breath, RIGHT chest pain. Stage III colon cancer, atrial fibrillation. EXAM: CHEST 1 VIEW COMPARISON:  CT chest  October 13, 2014 FINDINGS: The heart size and mediastinal contours are within normal limits. Skin fold projecting RIGHT lung apex. Both lungs are clear. Mild bronchitic changes. The visualized skeletal structures are unremarkable. IMPRESSION: Mild bronchitic changes without focal consolidation. Electronically Signed   By: Elon Alas M.D.   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 He had a prolonged EGD 1/17 due to significant clot burden in the stomach which needed to be cleared, required intubation for a period of time - overall, suspect he had variceal bleeding, had large varices with red spots, 7 bands placed, he also had portal hypertensive gastritis. It's possible his chest discomfort is due to banding which is not uncommon.  Hgb trended down yesterday to 7 grams so was given one unit PRBC's with increase to only 7.3 grams but no signs of active bleeding.  At this time recommend the following: - repeat Hgb at 1300 today - if CBC stable and no evidence of bleeding can give full liquids later today. - continue IV octreotide drip and IV protonix drip at this time to complete 72 hours - please contact  us with any evidence of rebleeding - once recovered from this acute bleed, he will need to be back on nonselective beta blocker, likely propanolol as nadolol was expensive/not covered by insurance. - he will warrant repeat EGD in a few weeks with further banding as needed   LOS: 2 days   ZEHR, JESSICA D.  02/27/2016, 9:36 AM  Pager number [phone]

## 2016-02-27 NOTE — Progress Notes (Signed)
Patient was able to get out bed and sit in chair. Activity tolerated well however pt complained of increased weakness. Dizziness lightheadedness denied. will continue to monitor.

## 2016-02-27 NOTE — Progress Notes (Signed)
Patient had a BM,moderate amount dark red black in color.

## 2016-02-28 LAB — HEMOGLOBIN AND HEMATOCRIT, BLOOD
HEMATOCRIT: 20.7 % — AB (ref 39.0–52.0)
HEMOGLOBIN: 7.4 g/dL — AB (ref 13.0–17.0)

## 2016-02-28 LAB — CBC
HCT: 19.3 % — ABNORMAL LOW (ref 39.0–52.0)
HEMOGLOBIN: 7 g/dL — AB (ref 13.0–17.0)
MCH: 30.4 pg (ref 26.0–34.0)
MCHC: 36.3 g/dL — ABNORMAL HIGH (ref 30.0–36.0)
MCV: 83.9 fL (ref 78.0–100.0)
PLATELETS: 82 10*3/uL — AB (ref 150–400)
RBC: 2.3 MIL/uL — AB (ref 4.22–5.81)
RDW: 14.9 % (ref 11.5–15.5)
WBC: 2 10*3/uL — ABNORMAL LOW (ref 4.0–10.5)

## 2016-02-28 LAB — BUN: BUN: 15 mg/dL (ref 6–20)

## 2016-02-28 NOTE — Progress Notes (Signed)
RT note:  Patient wishes to self administer CPAP.  Able to demonstrate turning machine on/off appropriately.  Has mask from home and can don without assistance.  Pt will call if he has any questions/concerns.

## 2016-02-28 NOTE — Progress Notes (Addendum)
      Progress Note   Subjective  Patient had another dark bowel movement overnight, otherwise feels well. Denies lightheadedness or dizziness. No abdominal pain   Objective   Vital signs in last 24 hours: Temp:  [97.5 F (36.4 C)-98.5 F (36.9 C)] 97.5 F (36.4 C) (01/20 0534) Pulse Rate:  [85-105] 105 (01/20 0534) Resp:  [18] 18 (01/20 0534) BP: (115-124)/(43-52) 124/45 (01/20 0534) SpO2:  [97 %] 97 % (01/20 0534) Last BM Date: 02/26/16 General:    white  male in NAD Heart:  Regular rate and rhythm; (HR 80s on exam) Lungs: Respirations even and unlabored, lungs CTA bilaterally Abdomen:  Soft, nontender and nondistended.  Extremities:  Without edema. Neurologic:  Alert and oriented,  grossly normal neurologically. Psych:  Cooperative. Normal mood and affect.  Intake/Output from previous day: 01/19 0701 - 01/20 0700 In: 2450 [I.V.:2400; IV Piggyback:50] Out: 300 [Urine:300] Intake/Output this shift: No intake/output data recorded.  Lab Results:  Recent Labs  02/26/16 0332  02/27/16 0511 02/27/16 1258 02/28/16 0533  WBC 6.2  --  2.9*  --  2.0*  HGB 7.8*  < > 7.4* 8.1* 7.0*  HCT 21.5*  < > 20.8* 22.8* 19.3*  PLT 96*  --  83*  --  82*  < > = values in this interval not displayed. BMET  Recent Labs  02/26/16 0332 02/27/16 0511  NA 140 141  K 4.1 3.7  CL 113* 113*  CO2 22 24  GLUCOSE 159* 168*  BUN 33* 28*  CREATININE 1.02 1.09  CALCIUM 8.0* 8.0*   LFT  Recent Labs  02/27/16 0511  PROT 4.9*  ALBUMIN 2.9*  AST 17  ALT 19  ALKPHOS 44  BILITOT 1.0   PT/INR  Recent Labs  02/25/16 1125  LABPROT 18.0*  INR 1.47    Studies/Results: No results found.     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 He had a prolonged EGD 1/17 due to significant clot burden in the stomach which needed to be  cleared, required intubation for a period of time - overall, suspect he had variceal bleeding, had large varices with red spots, 7 bands placed, he also had portal hypertensive gastritis.   Hgb trended down 2 days ago to 7, was given one unit PRBC's with fluctuating Hgb between 7-8s since that time. Unclear if passing old blood or if he is having slow active bleeding. Hgb 7/0 this AM, will repeat Hgb and BUN in a few hours to recheck, if he continues to drop will transfuse PRBC and will likely need another endoscopy to assess for active bleeding. If BUN is trending down and repeat Hgb is stable, may continue to monitor. He should be NPO this morning until decision on endoscopy is made. He and wife agreed with the plan.   Addendum: BUN normalized. Hgb back up to 7.4. Do not suspect active bleeding, likely passed old blood. Can increase to full liquid diet today. Minimize blood draws, repeat CBC in the morning.     Cellar, MD [facility] Pager [phone]

## 2016-02-28 NOTE — Progress Notes (Signed)
PROGRESS NOTE    [name]  [id number] DOB: 19-Jan-1941 DOA: 02/25/2016 PCP: Geoffery Lyons, MD    Brief Narrative:  76 y.o. 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 Patient reports that he has had a total of 3 loose bowel movements in which blood is mixed in with the stool. Associated symptoms include generalized malaise, lightheadedness, or decreased appetite. Denies having any abdominal pain, nausea, vomiting, loss of consciousness, or falls. Patient sees Dr. Havery Moros of GI in the outpatient setting. Patient notes that his last colonoscopy was on 10/2015 in which multiple polyps were removed. His last EGD was from 10/2014 which showed grade 2-3 esophageal varices  Upon admission into the emergency department patient was seen have a hemoglobin of 11.3 (previously 13.1 just 1  week ago).  Stool guaiac was noted to be positive. Patient was given a dose of Protonix and gastroenterology was consulted.   Assessment & Plan:   Principal Problem:   Upper GI bleed Active Problems:   Disorder of bilirubin excretion   History of colon cancer   Thrombocytopenia (HCC)   History of esophageal varices   Acute GI bleeding   Shortness of breath at rest  GI bleed with acute blood loss anemia:  - Patient presented with one-day history of bloody stools.  - Patient with 2 gm drop in hemoglobin over the past week - Stools noted to be guaiac positive at time of admission.  - GI was consulted. Patient underwent EGD on 1/17 with findings of large esophageal varices s/p banding x 7 which is suspected to be source of bleed. Also clotted red blood in stomach with portal hypertensive gastritis noted. -Hgb down to 7.0 on 1/18 and patient received one unit  of PRBC's -Patient reports feeling better overall - GI recs to advance to full liquid diet today   History of colon cancer s/p resection with oxaliplatin induced sinusoidal sclerosis and esophageal varices -Per above, large esophageal varices noted on EGD s/p banding -will reorder CBC for the AM  Gilbert syndrome -Remains stable -follow CBC in AM  Thrombocytopenia: chronic.  - Platelets down to 83 today - Repeat CBC in AM  DVT prophylaxis: SCD's Code Status: Full Family Communication: Pt in room, wife at bedside Disposition Plan: Uncertain at this time  Consultants:   GI  Procedures:   EGD 02/25/16 by Dr. Havery Moros  Antimicrobials: Anti-infectives    Start     Dose/Rate Route Frequency Ordered Stop   02/27/16 1000  cefTRIAXone (ROCEPHIN) 2 g in dextrose 5 % 50 mL IVPB     2 g 100 mL/hr over 30 Minutes Intravenous Daily 02/26/16 1218     02/25/16 1030  cefTRIAXone (ROCEPHIN) 1 g in dextrose 5 % 50 mL IVPB  Status:  Discontinued     1 g 100 mL/hr over 30 Minutes Intravenous Daily 02/25/16 1000 02/26/16 1218      Subjective: No complaints at present  Objective: Vitals:   02/27/16 1250 02/27/16 2155 02/27/16 2301 02/28/16 0534  BP: (!) 117/52  (!) 115/43 (!) 124/45  Pulse: 87  85 (!) 105  Resp: 18 18 18 18   Temp: 98.5 F (36.9 C)  98 F (36.7 C) 97.5 F (36.4 C)  TempSrc: Oral  Oral Oral  SpO2: 97%  97% 97%  Weight:  Height:        Intake/Output Summary (Last 24 hours) at 02/28/16 1219 Last data filed at 02/28/16 0700  Gross per 24 hour  Intake             1900 ml  Output              300 ml  Net             1600 ml   Filed Weights   02/24/16 2312 02/25/16 1347 02/26/16 1808  Weight: 82.6 kg (182 lb) 82.6 kg (182 lb) 87 kg (191 lb 12.8 oz)    Examination:  General exam: Awake, conversant, in nad Respiratory system: normal resp effort, clear, no audible wheezing Cardiovascular system: regular rate, s1, s2 Gastrointestinal system:  nondistended, pos BS, soft Central nervous system: cn2-12 grossly intact, strength intact Extremities: no clubbing, perfused Skin: normal skin turgor, no pallor Psychiatry: mood normal// no visual hallucinations   Data Reviewed: I have personally reviewed following labs and imaging studies  CBC:  Recent Labs Lab 02/24/16 2328 02/25/16 0455  02/26/16 0332 02/26/16 1121 02/27/16 0511 02/27/16 1258 02/28/16 0533 02/28/16 0920  WBC 7.4 6.3  --  6.2  --  2.9*  --  2.0*  --   HGB 11.3* 10.7*  < > 7.8* 7.0* 7.4* 8.1* 7.0* 7.4*  HCT 32.4* 30.1*  < > 21.5* 19.8* 20.8* 22.8* 19.3* 20.7*  MCV 83.1 84.6  --  85.3  --  84.2  --  83.9  --   PLT 129* 102*  --  96*  --  83*  --  82*  --   < > = values in this interval not displayed. Basic Metabolic Panel:  Recent Labs Lab 02/24/16 2328 02/25/16 0455 02/26/16 0332 02/27/16 0511 02/28/16 0920  NA 138 139 140 141  --   K 4.5 4.6 4.1 3.7  --   CL 108 110 113* 113*  --   CO2 24 22 22 24   --   GLUCOSE 159* 151* 159* 168*  --   BUN 25* 35* 33* 28* 15  CREATININE 0.89 0.78 1.02 1.09  --   CALCIUM 8.8* 8.8* 8.0* 8.0*  --    GFR: Estimated Creatinine Clearance: 69.1 mL/min (by C-G formula based on SCr of 1.09 mg/dL). Liver Function Tests:  Recent Labs Lab 02/24/16 2328 02/25/16 0455 02/26/16 0332 02/27/16 0511  AST 21 22 18 17   ALT 24 22 18 19   ALKPHOS 73 64 47 44  BILITOT 1.9* 2.3* 1.5* 1.0  PROT 6.5 6.2* 4.8* 4.9*  ALBUMIN 3.7 3.5 2.9* 2.9*   No results for input(s): LIPASE, AMYLASE in the last 168 hours. No results for input(s): AMMONIA in the last 168 hours. Coagulation Profile:  Recent Labs Lab 02/25/16 1125  INR 1.47   Cardiac Enzymes: No results for input(s): CKTOTAL, CKMB, CKMBINDEX, TROPONINI in the last 168 hours. BNP (last 3 results) No results for input(s): PROBNP in the last 8760 hours. HbA1C: No results for input(s): HGBA1C in the last 72 hours. CBG: No results for input(s): GLUCAP in the last 168  hours. Lipid Profile: No results for input(s): CHOL, HDL, LDLCALC, TRIG, CHOLHDL, LDLDIRECT in the last 72 hours. Thyroid Function Tests: No results for input(s): TSH, T4TOTAL, FREET4, T3FREE, THYROIDAB in the last 72 hours. Anemia Panel: No results for input(s): VITAMINB12, FOLATE, FERRITIN, TIBC, IRON, RETICCTPCT in the last 72 hours. Sepsis Labs: No results for input(s): PROCALCITON, LATICACIDVEN in the last 168 hours.  Recent Results (  from the past 240 hour(s))  MRSA PCR Screening     Status: None   Collection Time: 02/25/16  4:39 PM  Result Value Ref Range Status   MRSA by PCR NEGATIVE NEGATIVE Final    Comment:        The GeneXpert MRSA Assay (FDA approved for NASAL specimens only), is one component of a comprehensive MRSA colonization surveillance program. It is not intended to diagnose MRSA infection nor to guide or monitor treatment for MRSA infections.      Radiology Studies: No results found.  Scheduled Meds: . cefTRIAXone (ROCEPHIN)  IV  2 g Intravenous Daily  . gabapentin  100 mg Oral BID   Continuous Infusions: . sodium chloride 75 mL/hr at 02/27/16 2009  . octreotide  (SANDOSTATIN)    IV infusion 50 mcg/hr (02/28/16 1010)  . pantoprozole (PROTONIX) infusion 8 mg/hr (02/27/16 1451)     LOS: 3 days   Keyanah Kozicki, Orpah Melter, MD [facility] Pager [phone]  If 7PM-7AM, please contact night-coverage [email or web address] Password [email or web address] 02/28/2016, 12:19 PM

## 2016-02-29 ENCOUNTER — Other Ambulatory Visit: Payer: Self-pay | Admitting: Gastroenterology

## 2016-02-29 DIAGNOSIS — D649 Anemia, unspecified: Secondary | ICD-10-CM

## 2016-02-29 LAB — HEMOGLOBIN AND HEMATOCRIT, BLOOD
HCT: 19.5 % — ABNORMAL LOW (ref 39.0–52.0)
HCT: 21.2 % — ABNORMAL LOW (ref 39.0–52.0)
HEMOGLOBIN: 7.5 g/dL — AB (ref 13.0–17.0)
Hemoglobin: 7.1 g/dL — ABNORMAL LOW (ref 13.0–17.0)

## 2016-02-29 LAB — BUN: BUN: 11 mg/dL (ref 6–20)

## 2016-02-29 MED ORDER — PANTOPRAZOLE SODIUM 40 MG PO TBEC
40.0000 mg | DELAYED_RELEASE_TABLET | Freq: Two times a day (BID) | ORAL | Status: DC
  Administered 2016-02-29: 40 mg via ORAL
  Filled 2016-02-29: qty 1

## 2016-02-29 MED ORDER — PROPRANOLOL HCL 10 MG PO TABS
20.0000 mg | ORAL_TABLET | Freq: Two times a day (BID) | ORAL | Status: DC
  Administered 2016-02-29: 20 mg via ORAL
  Filled 2016-02-29: qty 2

## 2016-02-29 MED ORDER — PANTOPRAZOLE SODIUM 40 MG PO TBEC: 40.0000 mg | DELAYED_RELEASE_TABLET | Freq: Two times a day (BID) | ORAL | 0 refills | Status: AC

## 2016-02-29 MED ORDER — PROPRANOLOL HCL 20 MG PO TABS: 20.0000 mg | ORAL_TABLET | Freq: Two times a day (BID) | ORAL | 0 refills | Status: AC

## 2016-02-29 NOTE — Care Management Note (Signed)
Case Management Note  Patient Details  Name: [name] MRN: [id number] Date of Birth: 05/21/1940  Subjective/Objective:  GI bleed                   Action/Plan: Discharge Planning: AVS reviewed:  NCM spoke to pt and wife at bedside. Was independent prior to admission. No NCM needs identified.   PCP Burnard Bunting MD    Expected Discharge Date:  02/29/16               Expected Discharge Plan:  Home/Self Care  In-House Referral:  NA  Discharge planning Services  CM Consult  Post Acute Care Choice:  NA Choice offered to:  NA  DME Arranged:  N/A DME Agency:  NA  HH Arranged:  NA HH Agency:  NA  Status of Service:  Completed, signed off  If discussed at [place] of Stay Meetings, dates discussed:    Additional Comments:  Erenest Rasher, RN 02/29/2016, 3:20 PM

## 2016-02-29 NOTE — Progress Notes (Signed)
      Progress Note   Subjective  Patient did well overnight, passed some stool which has lightened. No pain. Ambulating. Hgb stable.   Objective   Vital signs in last 24 hours: Temp:  [98.1 F (36.7 C)-98.2 F (36.8 C)] 98.2 F (36.8 C) (01/21 0409) Pulse Rate:  [81-89] 86 (01/21 0409) Resp:  [18-20] 20 (01/21 0409) BP: (103-130)/(46-73) 126/46 (01/21 0409) SpO2:  [95 %-97 %] 95 % (01/21 0409) Last BM Date: 02/27/16 General:    white male in NAD Heart:  Regular rate and rhythm; no murmurs Lungs: Respirations even and unlabored, lungs CTA bilaterally Abdomen:  Soft, nontender and nondistended.  Extremities:  Without edema. Neurologic:  Alert and oriented,  grossly normal neurologically. Psych:  Cooperative. Normal mood and affect.  Intake/Output from previous day: 01/20 0701 - 01/21 0700 In: 3625 [P.O.:1800; I.V.:1775; IV Piggyback:50] Out: -  Intake/Output this shift: No intake/output data recorded.  Lab Results:  Recent Labs  02/27/16 0511  02/28/16 0533 02/28/16 0920 02/29/16 0533  WBC 2.9*  --  2.0*  --   --   HGB 7.4*  < > 7.0* 7.4* 7.1*  HCT 20.8*  < > 19.3* 20.7* 19.5*  PLT 83*  --  82*  --   --   < > = values in this interval not displayed. BMET  Recent Labs  02/27/16 0511 02/28/16 0920 02/29/16 0533  NA 141  --   --   K 3.7  --   --   CL 113*  --   --   CO2 24  --   --   GLUCOSE 168*  --   --   BUN 28* 15 11  CREATININE 1.09  --   --   CALCIUM 8.0*  --   --    LFT  Recent Labs  02/27/16 0511  PROT 4.9*  ALBUMIN 2.9*  AST 17  ALT 19  ALKPHOS 44  BILITOT 1.0   PT/INR No results for input(s): LABPROT, INR in the last 72 hours.  Studies/Results: No results found.     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 He had a prolonged EGD 1/17due to significant clot burden in  the stomach, required intubation. Overall, suspect he had variceal bleeding, had large varices with red spots, 7 bands placed, he also had portal hypertensive gastritis.   BUN has normalized, Hgb stable over a few days. Stools have lightened, no evidence of active bleeding  Recommend at this time: - soft diet today and at time of discharge until next EGD - discharge home this afternoon if tolerating diet - change protonix to 40mg  PO BID - can stop octreotide drip (day 5 today) - start propranolol 20mg  twice daily  - will repeat EGD in 2 weeks or so for repeat banding as needed, our office will call patient to coordinate  Please call with questions / concerns.  [place] Cellar, MD [facility] Pager [phone]

## 2016-02-29 NOTE — Discharge Summary (Signed)
Physician Discharge Summary  [name] [id number] DOB: Jun 17, 1940 DOA: 02/25/2016  PCP: Geoffery Lyons, MD  Admit date: 02/25/2016 Discharge date: 02/29/2016  Admitted From: Home Disposition:  Home  Recommendations for Outpatient Follow-up:  1. Follow up with PCP in 1-2 weeks 2. 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 Patient reports that he has had a total of 3 loose bowel movements in which blood is mixed in with the stool. Associated symptoms include generalized malaise, lightheadedness, ordecreased appetite. Denies having any abdominal pain, nausea, vomiting, loss of consciousness, or falls. Patient sees Dr. Havery Moros of GI in the outpatient setting. Patient notes that his last colonoscopy was on 10/2015 in which multiple polyps were removed. His last EGD was from 10/2014 which showed grade 2-3 esophageal varices  Upon admission into the emergency department patient was seen have a hemoglobin of 11.3 (previously 13.1 just 1 week ago). Stool guaiac was noted to be positive. Patient was given a dose of Protonix and gastroenterology was consulted.   GI bleed with acute blood loss anemia:  - Patient presented with one-day history of bloody stools.  - Patient with 2 gm drop in hemoglobin over the past week - Stools noted to be guaiac positive at time of admission.  - GI was consulted. Patient underwent EGD on 1/17 with findings of large esophageal varices s/p banding x 7 which is suspected to be source of bleed. Also clotted red blood in stomach with portal hypertensive  gastritis noted. -Hgb down to 7.0 on 1/18 and patient received one unit of PRBC's -Patient reported feeling improved - Per GI, if patient tolerates soft diet, then plan d/c with follow up EGD in 2 weeks  History of colon cancer s/presection with oxaliplatin induced sinusoidal sclerosis and esophageal varices -Per above, large esophageal varices noted on EGD s/p banding -GI recommends repeat EGD in 2 weeks  Rosanna Randy syndrome -Remained stable  Thrombocytopenia: chronic.  - Platelets remained stable  Discharge Diagnoses:  Principal Problem:   Upper GI bleed Active Problems:   Disorder of bilirubin excretion   History of colon cancer   Thrombocytopenia (HCC)   History of esophageal varices   Acute GI bleeding   Shortness of breath at rest    Discharge Instructions   Allergies as of 02/29/2016   No Known Allergies     Medication List    STOP taking these medications   metoprolol succinate 50 MG 24 hr tablet Commonly known as:  TOPROL-XL   omeprazole 20 MG capsule Commonly known as:  PRILOSEC     TAKE these medications   amphetamine-dextroamphetamine 10 MG tablet Commonly known as:  ADDERALL Take 30 mg by mouth every morning.   ferrous sulfate 325 (65 FE) MG tablet Take 325 mg by mouth 2 (two) times daily.   gabapentin 100 MG capsule Commonly known as:  NEURONTIN One by mouth twice a day   pantoprazole 40 MG tablet Commonly known as:  PROTONIX Take 1 tablet (40 mg total) by mouth 2 (two) times daily.   propranolol 20 MG tablet Commonly known as:  INDERAL Take 1 tablet (20 mg total) by mouth 2 (two) times daily.   sildenafil 100  MG tablet Commonly known as:  VIAGRA Take 1 tablet (100 mg total) by mouth daily as needed for erectile dysfunction.   simvastatin 20 MG tablet Commonly known as:  ZOCOR Take 20 mg by mouth daily.      Follow-up Information    ARONSON,RICHARD A, MD. Schedule an appointment as soon as possible for a visit in 2 week(s).    Specialty:  Internal Medicine Contact information: [address] [place] [id number] [place], MD Follow up in 2 week(s).   Specialty:  Gastroenterology Contact information: [address] [place] [address] [phone]          No Known Allergies  Consultations:  GI  Procedures/Studies: Dg Chest 1 View  Result Date: 02/25/2016 CLINICAL DATA:  Shortness of breath, RIGHT chest pain. Stage III colon cancer, atrial fibrillation. EXAM: CHEST 1 VIEW COMPARISON:  CT chest October 13, 2014 FINDINGS: The heart size and mediastinal contours are within normal limits. Skin fold projecting RIGHT lung apex. Both lungs are clear. Mild bronchitic changes. The visualized skeletal structures are unremarkable. IMPRESSION: Mild bronchitic changes without focal consolidation. Electronically Signed   By: Elon Alas M.D.   On: 02/25/2016 19:03    Subjective: No complaints  Discharge Exam: Vitals:   02/29/16 0409 02/29/16 1419  BP: (!) 126/46 115/88  Pulse: 86 72  Resp: 20 18  Temp: 98.2 F (36.8 C) 98 F (36.7 C)   Vitals:   02/28/16 1421 02/28/16 2116 02/29/16 0409 02/29/16 1419  BP: 103/73 130/62 (!) 126/46 115/88  Pulse: 89 81 86 72  Resp: 18 18 20 18   Temp: 98.1 F (36.7 C) 98.2 F (36.8 C) 98.2 F (36.8 C) 98 F (36.7 C)  TempSrc: Oral Oral Oral Oral  SpO2: 97% 97% 95% 98%  Weight:      Height:        General: Pt is alert, awake, not in acute distress Cardiovascular: RRR, S1/S2 +, no rubs, no gallops Respiratory: CTA bilaterally, no wheezing, no rhonchi Abdominal: Soft, NT, ND, bowel sounds + Extremities: no edema, no cyanosis   The results of significant diagnostics from this hospitalization (including imaging, microbiology, ancillary and laboratory) are listed below for reference.     Microbiology: Recent Results (from the past 240 hour(s))  MRSA PCR Screening     Status: None   Collection Time:  02/25/16  4:39 PM  Result Value Ref Range Status   MRSA by PCR NEGATIVE NEGATIVE Final    Comment:        The GeneXpert MRSA Assay (FDA approved for NASAL specimens only), is one component of a comprehensive MRSA colonization surveillance program. It is not intended to diagnose MRSA infection nor to guide or monitor treatment for MRSA infections.      Labs: BNP (last 3 results) No results for input(s): BNP in the last 8760 hours. Basic Metabolic Panel:  Recent Labs Lab 02/24/16 2328 02/25/16 0455 02/26/16 0332 02/27/16 0511 02/28/16 0920 02/29/16 0533  NA 138 139 140 141  --   --   K 4.5 4.6 4.1 3.7  --   --   CL 108 110 113* 113*  --   --   CO2 24 22 22 24   --   --   GLUCOSE 159* 151* 159* 168*  --   --   BUN 25* 35* 33* 28* 15 11  CREATININE 0.89 0.78 1.02 1.09  --   --  CALCIUM 8.8* 8.8* 8.0* 8.0*  --   --    Liver Function Tests:  Recent Labs Lab 02/24/16 2328 02/25/16 0455 02/26/16 0332 02/27/16 0511  AST 21 22 18 17   ALT 24 22 18 19   ALKPHOS 73 64 47 44  BILITOT 1.9* 2.3* 1.5* 1.0  PROT 6.5 6.2* 4.8* 4.9*  ALBUMIN 3.7 3.5 2.9* 2.9*   No results for input(s): LIPASE, AMYLASE in the last 168 hours. No results for input(s): AMMONIA in the last 168 hours. CBC:  Recent Labs Lab 02/24/16 2328 02/25/16 0455  02/26/16 0332  02/27/16 0511 02/27/16 1258 02/28/16 0533 02/28/16 0920 02/29/16 0533 02/29/16 1335  WBC 7.4 6.3  --  6.2  --  2.9*  --  2.0*  --   --   --   HGB 11.3* 10.7*  < > 7.8*  < > 7.4* 8.1* 7.0* 7.4* 7.1* 7.5*  HCT 32.4* 30.1*  < > 21.5*  < > 20.8* 22.8* 19.3* 20.7* 19.5* 21.2*  MCV 83.1 84.6  --  85.3  --  84.2  --  83.9  --   --   --   PLT 129* 102*  --  96*  --  83*  --  82*  --   --   --   < > = values in this interval not displayed. Cardiac Enzymes: No results for input(s): CKTOTAL, CKMB, CKMBINDEX, TROPONINI in the last 168 hours. BNP: Invalid input(s): POCBNP CBG: No results for input(s): GLUCAP in the last 168  hours. D-Dimer No results for input(s): DDIMER in the last 72 hours. Hgb A1c No results for input(s): HGBA1C in the last 72 hours. Lipid Profile No results for input(s): CHOL, HDL, LDLCALC, TRIG, CHOLHDL, LDLDIRECT in the last 72 hours. Thyroid function studies No results for input(s): TSH, T4TOTAL, T3FREE, THYROIDAB in the last 72 hours.  Invalid input(s): FREET3 Anemia work up No results for input(s): VITAMINB12, FOLATE, FERRITIN, TIBC, IRON, RETICCTPCT in the last 72 hours. Urinalysis    Component Value Date/Time   COLORURINE yellow 12/24/2009 0927   APPEARANCEUR Clear 12/24/2009 0927   LABSPEC 1.010 03/15/2010 1806   PHURINE 7.0 03/15/2010 1806   HGBUR NEGATIVE 03/15/2010 1806   HGBUR negative 12/24/2009 0927   BILIRUBINUR n 04/10/2014 1011   KETONESUR NEGATIVE 03/15/2010 1806   PROTEINUR n 04/10/2014 1011   PROTEINUR NEGATIVE 03/15/2010 1806   UROBILINOGEN 0.2 04/10/2014 1011   UROBILINOGEN 0.2 03/15/2010 1806   NITRITE n 04/10/2014 1011   NITRITE NEGATIVE 03/15/2010 1806   LEUKOCYTESUR Negative 04/10/2014 1011   Sepsis Labs Invalid input(s): PROCALCITONIN,  WBC,  LACTICIDVEN Microbiology Recent Results (from the past 240 hour(s))  MRSA PCR Screening     Status: None   Collection Time: 02/25/16  4:39 PM  Result Value Ref Range Status   MRSA by PCR NEGATIVE NEGATIVE Final    Comment:        The GeneXpert MRSA Assay (FDA approved for NASAL specimens only), is one component of a comprehensive MRSA colonization surveillance program. It is not intended to diagnose MRSA infection nor to guide or monitor treatment for MRSA infections.      SIGNED:   Donne Hazel, MD  [facility] 02/29/2016, 2:32 PM  If 7PM-7AM, please contact night-coverage [email or web address] Password [email or web address]

## 2016-02-29 NOTE — Progress Notes (Signed)
Pt had BM, maroon colored earlier in shift , Dr. Havery Moros notified, Labs completed and resulted, MD updated pt, pt discharged as planned. Pt present without others sypmtons. Wife at bedside. SRP, RN

## 2016-02-29 NOTE — Care Management Important Message (Signed)
Important Message  Patient Details  Name: [name] MRN: [id number] Date of Birth: 06-Mar-1940   Medicare Important Message Given:  Yes    Erenest Rasher, RN 02/29/2016, 3:20 PM

## 2016-03-01 LAB — TYPE AND SCREEN
ABO/RH(D): B POS
ANTIBODY SCREEN: NEGATIVE
UNIT DIVISION: 0

## 2016-03-02 ENCOUNTER — Other Ambulatory Visit (INDEPENDENT_AMBULATORY_CARE_PROVIDER_SITE_OTHER): Payer: Medicare Other

## 2016-03-02 ENCOUNTER — Other Ambulatory Visit: Payer: Self-pay

## 2016-03-02 DIAGNOSIS — D649 Anemia, unspecified: Secondary | ICD-10-CM | POA: Diagnosis not present

## 2016-03-02 DIAGNOSIS — I8501 Esophageal varices with bleeding: Secondary | ICD-10-CM

## 2016-03-02 LAB — HEMOGLOBIN: Hemoglobin: 8.9 g/dL — ABNORMAL LOW (ref 13.0–17.0)

## 2016-03-02 NOTE — Progress Notes (Signed)
Prep letter mailed. 

## 2016-03-03 ENCOUNTER — Other Ambulatory Visit: Payer: Self-pay

## 2016-03-03 DIAGNOSIS — I8501 Esophageal varices with bleeding: Secondary | ICD-10-CM

## 2016-03-05 ENCOUNTER — Telehealth: Payer: Self-pay | Admitting: Gastroenterology

## 2016-03-05 ENCOUNTER — Other Ambulatory Visit: Payer: Self-pay

## 2016-03-05 MED ORDER — FUROSEMIDE 20 MG PO TABS: 20.0000 mg | ORAL_TABLET | Freq: Every day | ORAL | 0 refills | Status: DC

## 2016-03-05 MED ORDER — SPIRONOLACTONE 25 MG PO TABS: 25.0000 mg | ORAL_TABLET | Freq: Every day | ORAL | 0 refills | Status: DC

## 2016-03-05 NOTE — Telephone Encounter (Signed)
Patient called to report that when he got home from recent hospitalization on 1/21, noticed he had gained 8 lbs. Of fluid. Mostly in abdomen and legs, more so in right leg. Patient states abdomen is tighter. He is unsure if legs have any discoloration to them, normal skin temperature to legs. He is feeling okay, no fever, no shortness of breath. He is scheduled for 1/31 for EGD with varices banding with Dr. Havery Moros. Patient was concerned about fluid gain. Please advise.

## 2016-03-05 NOTE — Telephone Encounter (Signed)
Start spironolactone 25 mg qd  Furosemide 20 mg qd # 30 no RF each  Ask him to weigh each day and call us back with update on Monday 1/30

## 2016-03-05 NOTE — Telephone Encounter (Signed)
Patient advised of new medications to try to relieve fluid build up. Rx's sent to his pharmacy. He understands to record his weight daily and will call us on Monday to report his progress.

## 2016-03-08 ENCOUNTER — Telehealth: Payer: Self-pay | Admitting: Gastroenterology

## 2016-03-08 NOTE — Telephone Encounter (Signed)
Patient called back to let you know that his weight has been steadily coming back down towards his normal weight. Today was 186, BP 130/66 at highest reading. Patient scheduled for repeat EGD on 1/31.

## 2016-03-08 NOTE — Telephone Encounter (Signed)
Okay sounds good thanks for the update.

## 2016-03-09 ENCOUNTER — Other Ambulatory Visit (INDEPENDENT_AMBULATORY_CARE_PROVIDER_SITE_OTHER): Payer: Medicare Other

## 2016-03-09 DIAGNOSIS — I8501 Esophageal varices with bleeding: Secondary | ICD-10-CM | POA: Diagnosis not present

## 2016-03-09 LAB — CBC WITH DIFFERENTIAL/PLATELET
BASOS PCT: 0.4 % (ref 0.0–3.0)
Basophils Absolute: 0 10*3/uL (ref 0.0–0.1)
EOS PCT: 2.1 % (ref 0.0–5.0)
Eosinophils Absolute: 0.1 10*3/uL (ref 0.0–0.7)
HEMATOCRIT: 30 % — AB (ref 39.0–52.0)
HEMOGLOBIN: 10.1 g/dL — AB (ref 13.0–17.0)
LYMPHS PCT: 23.5 % (ref 12.0–46.0)
Lymphs Abs: 0.9 10*3/uL (ref 0.7–4.0)
MCHC: 33.7 g/dL (ref 30.0–36.0)
MCV: 90.5 fl (ref 78.0–100.0)
MONO ABS: 0.3 10*3/uL (ref 0.1–1.0)
Monocytes Relative: 7.5 % (ref 3.0–12.0)
NEUTROS ABS: 2.5 10*3/uL (ref 1.4–7.7)
Neutrophils Relative %: 66.5 % (ref 43.0–77.0)
PLATELETS: 168 10*3/uL (ref 150.0–400.0)
RBC: 3.31 Mil/uL — ABNORMAL LOW (ref 4.22–5.81)
RDW: 16.4 % — AB (ref 11.5–15.5)
WBC: 3.8 10*3/uL — AB (ref 4.0–10.5)

## 2016-03-09 LAB — PROTIME-INR
INR: 1.3 ratio — ABNORMAL HIGH (ref 0.8–1.0)
Prothrombin Time: 14 s — ABNORMAL HIGH (ref 9.6–13.1)

## 2016-03-10 ENCOUNTER — Ambulatory Visit (HOSPITAL_COMMUNITY)
Admission: RE | Admit: 2016-03-10 | Discharge: 2016-03-10 | Disposition: A | Discharge status: Home / Self Care | Payer: Medicare Other | Source: Ambulatory Visit | Attending: Gastroenterology | Admitting: Gastroenterology

## 2016-03-10 ENCOUNTER — Encounter (HOSPITAL_COMMUNITY)
Admission: RE | Disposition: A | Discharge status: Home / Self Care | Payer: Self-pay | Source: Ambulatory Visit | Attending: Gastroenterology

## 2016-03-10 ENCOUNTER — Ambulatory Visit (HOSPITAL_COMMUNITY): Payer: Medicare Other | Admitting: Anesthesiology

## 2016-03-10 ENCOUNTER — Encounter (HOSPITAL_COMMUNITY): Payer: Self-pay | Admitting: Anesthesiology

## 2016-03-10 DIAGNOSIS — K766 Portal hypertension: Secondary | ICD-10-CM | POA: Diagnosis not present

## 2016-03-10 DIAGNOSIS — K219 Gastro-esophageal reflux disease without esophagitis: Secondary | ICD-10-CM | POA: Diagnosis not present

## 2016-03-10 DIAGNOSIS — G4733 Obstructive sleep apnea (adult) (pediatric): Secondary | ICD-10-CM | POA: Insufficient documentation

## 2016-03-10 DIAGNOSIS — Z87891 Personal history of nicotine dependence: Secondary | ICD-10-CM | POA: Insufficient documentation

## 2016-03-10 DIAGNOSIS — K2970 Gastritis, unspecified, without bleeding: Secondary | ICD-10-CM | POA: Insufficient documentation

## 2016-03-10 DIAGNOSIS — E7800 Pure hypercholesterolemia, unspecified: Secondary | ICD-10-CM | POA: Insufficient documentation

## 2016-03-10 DIAGNOSIS — Z86010 Personal history of colonic polyps: Secondary | ICD-10-CM | POA: Insufficient documentation

## 2016-03-10 DIAGNOSIS — Z87820 Personal history of traumatic brain injury: Secondary | ICD-10-CM | POA: Insufficient documentation

## 2016-03-10 DIAGNOSIS — I4891 Unspecified atrial fibrillation: Secondary | ICD-10-CM | POA: Diagnosis not present

## 2016-03-10 DIAGNOSIS — I8501 Esophageal varices with bleeding: Secondary | ICD-10-CM | POA: Diagnosis not present

## 2016-03-10 DIAGNOSIS — Z85038 Personal history of other malignant neoplasm of large intestine: Secondary | ICD-10-CM | POA: Insufficient documentation

## 2016-03-10 DIAGNOSIS — K3189 Other diseases of stomach and duodenum: Secondary | ICD-10-CM | POA: Diagnosis not present

## 2016-03-10 DIAGNOSIS — K2950 Unspecified chronic gastritis without bleeding: Secondary | ICD-10-CM

## 2016-03-10 DIAGNOSIS — I2510 Atherosclerotic heart disease of native coronary artery without angina pectoris: Secondary | ICD-10-CM | POA: Diagnosis not present

## 2016-03-10 HISTORY — PX: ESOPHAGOGASTRODUODENOSCOPY (EGD) WITH PROPOFOL: SHX5813

## 2016-03-10 HISTORY — PX: GASTRIC VARICES BANDING: SHX5519

## 2016-03-10 SURGERY — ESOPHAGOGASTRODUODENOSCOPY (EGD) WITH PROPOFOL
Anesthesia: Monitor Anesthesia Care

## 2016-03-10 SURGERY — EGD (ESOPHAGOGASTRODUODENOSCOPY)
Anesthesia: Monitor Anesthesia Care

## 2016-03-10 MED ORDER — PHENYLEPHRINE HCL 10 MG/ML IJ SOLN
INTRAMUSCULAR | Status: AC
  Filled 2016-03-10: qty 4

## 2016-03-10 MED ORDER — PROPOFOL 500 MG/50ML IV EMUL
INTRAVENOUS | Status: DC | PRN
  Administered 2016-03-10: 20 mg via INTRAVENOUS
  Administered 2016-03-10: 30 mg via INTRAVENOUS
  Administered 2016-03-10: 20 mg via INTRAVENOUS

## 2016-03-10 MED ORDER — PROPOFOL 500 MG/50ML IV EMUL
INTRAVENOUS | Status: DC | PRN
  Administered 2016-03-10: 100 ug/kg/min via INTRAVENOUS

## 2016-03-10 MED ORDER — ONDANSETRON HCL 4 MG/2ML IJ SOLN
INTRAMUSCULAR | Status: DC | PRN
Start: 2016-03-10 — End: 2016-03-10
  Administered 2016-03-10: 4 mg via INTRAVENOUS

## 2016-03-10 MED ORDER — PROPOFOL 10 MG/ML IV BOLUS
INTRAVENOUS | Status: AC
  Filled 2016-03-10: qty 40

## 2016-03-10 MED ORDER — LACTATED RINGERS IV SOLN
INTRAVENOUS | Status: DC | PRN
  Administered 2016-03-10: 12:00:00 via INTRAVENOUS

## 2016-03-10 MED ORDER — PHENYLEPHRINE 40 MCG/ML (10ML) SYRINGE FOR IV PUSH (FOR BLOOD PRESSURE SUPPORT)
PREFILLED_SYRINGE | INTRAVENOUS | Status: AC
  Filled 2016-03-10: qty 10

## 2016-03-10 MED ORDER — LACTATED RINGERS IV SOLN: INTRAVENOUS | Status: DC

## 2016-03-10 SURGICAL SUPPLY — 14 items

## 2016-03-10 NOTE — Interval H&P Note (Signed)
History and Physical Interval Note:  03/10/2016 12:29 PM  [name]  has presented today for surgery, with the diagnosis of GI variceal bleed  The various methods of treatment have been discussed with the patient and family. After consideration of risks, benefits and other options for treatment, the patient has consented to  Procedure(s): ESOPHAGOGASTRODUODENOSCOPY (EGD) WITH PROPOFOL (N/A) GASTRIC VARICES BANDING (N/A) as a surgical intervention .  The patient's history has been reviewed, patient examined, no change in status, stable for surgery.  I have reviewed the patient's chart and labs.  Questions were answered to the patient's satisfaction.     Renelda Loma Korrie Hofbauer

## 2016-03-10 NOTE — Anesthesia Postprocedure Evaluation (Signed)
Anesthesia Post Note  Patient: [name]  Procedure(s) Performed: Procedure(s) (LRB): ESOPHAGOGASTRODUODENOSCOPY (EGD) WITH PROPOFOL (N/A) GASTRIC VARICES BANDING (N/A)  Patient location during evaluation: PACU Anesthesia Type: MAC Level of consciousness: awake and alert Pain management: pain level controlled Vital Signs Assessment: post-procedure vital signs reviewed and stable Respiratory status: spontaneous breathing, nonlabored ventilation, respiratory function stable and patient connected to nasal cannula oxygen Cardiovascular status: stable and blood pressure returned to baseline Anesthetic complications: no       Last Vitals:  Vitals:   03/10/16 1300 03/10/16 1310  BP: 105/82 (!) 127/55  Pulse: 72 68  Resp: 17 16  Temp:      Last Pain:  Vitals:   03/10/16 1250  TempSrc: Oral                 Sakeena Teall DAVID

## 2016-03-10 NOTE — Transfer of Care (Signed)
Immediate Anesthesia Transfer of Care Note  Patient: [name]  Procedure(s) Performed: Procedure(s): ESOPHAGOGASTRODUODENOSCOPY (EGD) WITH PROPOFOL (N/A) GASTRIC VARICES BANDING (N/A)  Patient Location: PACU  Anesthesia Type:MAC  Level of Consciousness:  sedated, patient cooperative and responds to stimulation  Airway & Oxygen Therapy:Patient Spontanous Breathing and Patient connected to face mask oxgen  Post-op Assessment:  Report given to PACU RN and Post -op Vital signs reviewed and stable  Post vital signs:  Reviewed and stable  Last Vitals:  Vitals:   03/10/16 1155  BP: (!) 106/41  Pulse: 65  Resp: 19  Temp: 00.7 C    Complications: No apparent anesthesia complications

## 2016-03-10 NOTE — Op Note (Signed)
[facility] Patient Name: [name] Procedure Date: 03/10/2016 MRN: [id number] Attending MD: Carlota Raspberry. Anastasya Jewell MD, MD Date of Birth: 17-Nov-1940 CSN: [id number] Age: 76 Admit Type: Outpatient Procedure:                Upper GI endoscopy Indications:              Follow-up of bleeding esophageal varices - s/p 7                            bands placed 2 weeks ago, now on propranolol Providers:                Carlota Raspberry. Lajoya Dombek MD, MD, Burtis Junes, RN, Cherylynn Ridges, Technician, Arnoldo Hooker, CRNA Referring MD:              Medicines:                Monitored Anesthesia Care Complications:            No immediate complications. Estimated blood loss:                            Minimal. Estimated Blood Loss:     Estimated blood loss was minimal. Procedure:                Pre-Anesthesia Assessment:                           - Prior to the procedure, a History and Physical                            was performed, and patient medications and                            allergies were reviewed. The patient's tolerance of                            previous anesthesia was also reviewed. The risks                            and benefits of the procedure and the sedation                            options and risks were discussed with the patient.                            All questions were answered, and informed consent                            was obtained. Prior Anticoagulants: The patient has                            taken no previous anticoagulant or antiplatelet  agents. ASA Grade Assessment: III - A patient with                            severe systemic disease. After reviewing the risks                            and benefits, the patient was deemed in                            satisfactory condition to undergo the procedure.                           After obtaining informed consent, the endoscope was                             passed under direct vision. Throughout the                            procedure, the patient's blood pressure, pulse, and                            oxygen saturations were monitored continuously. The                            EG-[id number] ([id number]) scope was introduced through the                            mouth, and advanced to the second part of duodenum.                            The upper GI endoscopy was accomplished without                            difficulty. The patient tolerated the procedure                            well. Scope In: Scope Out: Findings:      Esophagogastric landmarks were identified: the Z-line was found at 42       cm, the gastroesophageal junction was found at 42 cm and the upper       extent of the gastric folds was found at 42 cm from the incisors.      Large to medium sized varices were found in the middle third of the       esophagus and in the lower third of the esophagus. Overall the size of       the varices were much smaller than initially appreciated compared to       pre-banding. Multiple ulcerations were noted in the distal and mid       esphagus from prior banding sites and were clean based. Four bands were       successfully placed (2 in distal esophagus, 2 in mid esophagus)       resulting in deflation of varices. There was no bleeding during, and at       the end, of the procedure.  The exam of the esophagus was otherwise normal.      Diffuse mild inflammation characterized by congestion (edema) and       erythema was found in the gastric fundus and in the gastric body       consistent with known portal hypertensive gastritis.      The exam of the stomach was otherwise normal.      The duodenal bulb and second portion of the duodenum were normal. Impression:               - Esophagogastric landmarks identified.                           - Esophageal varices with stigmata of recent                            banding,  improved in size compared to previous.                            Banded x 4 with deflation.                           - Portal hypertensive gastritis.                           - Normal duodenal bulb and second portion of the                            duodenum.                           - No specimens collected. Moderate Sedation:      No moderate sedation, case performed with MAC Recommendation:           - Patient has a contact number available for                            emergencies. The signs and symptoms of potential                            delayed complications were discussed with the                            patient. Return to normal activities tomorrow.                            Written discharge instructions were provided to the                            patient.                           - Full liquid diet today, soft diet tomorrow, then                            advance as tolerated.                           -  Continue present medications (protonix and                            propranolol)                           - Repeat upper endoscopy in 2-4 weeks for                            retreatment as needed. Our clinic will call to                            schedule. Procedure Code(s):        --- Professional ---                           [phone], Esophagogastroduodenoscopy, flexible,                            transoral; with band ligation of esophageal/gastric                            varices Diagnosis Code(s):        --- Professional ---                           K29.50, Unspecified chronic gastritis without                            bleeding                           I85.01, Esophageal varices with bleeding CPT copyright 2016 American Medical Association. All rights reserved. The codes documented in this report are preliminary and upon coder review may  be revised to meet current compliance requirements. Remo Lipps P. Leiloni Smithers MD, MD 03/10/2016 12:57:46  PM This report has been signed electronically. Number of Addenda: 0

## 2016-03-10 NOTE — Anesthesia Preprocedure Evaluation (Signed)
Anesthesia Evaluation  Patient identified by MRN, date of birth, ID band Patient awake    Reviewed: Allergy & Precautions, NPO status , Patient's Chart, lab work & pertinent test results  Airway Mallampati: I  TM Distance: >3 FB Neck ROM: Full    Dental   Pulmonary sleep apnea , former smoker,    Pulmonary exam normal        Cardiovascular Normal cardiovascular exam     Neuro/Psych    GI/Hepatic GERD  Medicated and Controlled,  Endo/Other    Renal/GU      Musculoskeletal   Abdominal   Peds  Hematology   Anesthesia Other Findings   Reproductive/Obstetrics                             Anesthesia Physical Anesthesia Plan  ASA: III  Anesthesia Plan: MAC   Post-op Pain Management:    Induction: Intravenous  Airway Management Planned: Simple Face Mask  Additional Equipment:   Intra-op Plan:   Post-operative Plan:   Informed Consent: I have reviewed the patients History and Physical, chart, labs and discussed the procedure including the risks, benefits and alternatives for the proposed anesthesia with the patient or authorized representative who has indicated his/her understanding and acceptance.     Plan Discussed with: CRNA and Surgeon  Anesthesia Plan Comments:         Anesthesia Quick Evaluation

## 2016-03-10 NOTE — H&P (Signed)
HPI :  76 y/o male with noncirrhotic portal hypertension with esophageal varices, admitted 2 weeks ago with large bleed suspected due to varices, banded x 7. He has recovered and here for follow up EGD with banding as needed. Now on propranolol.  Past Medical History:  Diagnosis Date  . Anemia   . Arthritis    "thumbs" (11/29/2013)  . Atrial fibrillation [facility]) 2006   Dr Tonny Bollman af-no meds  . colon ca dx'd 02/2011   COLON CANCER  . Diverticulosis    Sigmoid colon  . Esophageal stricture   . Esophageal varices determined by endoscopy ([place]) sept 2016  . Fracture of distal femur ([place]) 11/29/2013  . GERD (gastroesophageal reflux disease) 03-31-11   tx. Omeprazole  . Hearing loss    has bilateral hearing aids  . Hernia, abdominal    at the umbilicus  . History of hemorrhoids   . History of irregular heartbeat   . Hypercholesterolemia   . Hyperlipidemia   . 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 OSA on CPAP dx'd 2010   uses cap nightly  . Oxygen deficiency   . Personal history of colonic polyps 09/17/2009   Tubular adenoma  . Pneumonia 01/2013  . 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 Sleep apnea    uses c pap at home  . TBI (traumatic brain injury) ([place]) 1986   S/P fall; "slow to get going q morning" (10//22/2015)     Past Surgical History:  Procedure Laterality Date  . APPENDECTOMY  03/2011  . CARDIAC CATHETERIZATION  07/2002   Dr Wynonia Lawman  . COLON SURGERY  2013  . COLONOSCOPY  sept 2016   upper endo and colonoscopy  . ESOPHAGEAL DILATION  2006  . ESOPHAGOGASTRODUODENOSCOPY (EGD) WITH PROPOFOL N/A 02/25/2016   Procedure: ESOPHAGOGASTRODUODENOSCOPY (EGD) WITH PROPOFOL;  Surgeon: Manus Gunning, MD;  Location: [facility];  Service: Gastroenterology;  Laterality: N/A;  . INGUINAL HERNIA REPAIR Left ~ 1991  . LAPAROSCOPIC PARTIAL COLECTOMY  04/08/2011  . ORIF DISTAL FEMUR  FRACTURE Right 11/29/2013   "put plate in"  . ORIF FEMUR FRACTURE Right 11/29/2013   Procedure: OPEN REDUCTION INTERNAL FIXATION (ORIF) RIGHT  DISTAL FEMUR FRACTURE;  Surgeon: Rozanna Box, MD;  Location: [place];  Service: Orthopedics;  Laterality: Right;  . PORT-A-CATH REMOVAL  11/09/2011   Procedure: REMOVAL PORT-A-CATH;  Surgeon: Odis Hollingshead, MD;  Location: [place];  Service: General;  Laterality: N/A;  . PORTACATH PLACEMENT  04/30/2011   Procedure: INSERTION PORT-A-CATH;  Surgeon: Odis Hollingshead, MD;  Location: [place];  Service: General;  Laterality: Right;   Family History  Problem Relation Age of Onset  . Diabetes Father   . Prostate cancer Father   . Heart attack Father 38  . Heart failure Mother     Congestive heart failure  . Cancer Brother     colon  . Colon cancer Brother 75  . Stomach cancer Sister   . Colon cancer Other   . Colon cancer Sister   . Coronary artery disease Brother     CABG  . Lung cancer Brother   . Anesthesia problems Neg Hx   . Esophageal cancer Neg Hx    Social History  Substance Use Topics  . Smoking status: Former Smoker    Packs/day: 0.50    Years: 7.00    Types: Cigarettes    Quit  date: 03/21/1961  . Smokeless tobacco: Never Used  . Alcohol use No   Current Facility-Administered Medications  Medication Dose Route Frequency Provider Last Rate Last Dose  . lactated ringers infusion   Intravenous Continuous Manus Gunning, MD       Facility-Administered Medications Ordered in Other Encounters  Medication Dose Route Frequency Provider Last Rate Last Dose  . lactated ringers infusion    Continuous PRN Kristopher Key, CRNA       No Known Allergies   Review of Systems: All systems reviewed and negative except where noted in HPI.    Dg Chest 1 View  Result Date: 02/25/2016 CLINICAL DATA:  Shortness of breath, RIGHT chest pain. Stage III colon cancer, atrial fibrillation. EXAM: CHEST 1 VIEW COMPARISON:   CT chest October 13, 2014 FINDINGS: The heart size and mediastinal contours are within normal limits. Skin fold projecting RIGHT lung apex. Both lungs are clear. Mild bronchitic changes. The visualized skeletal structures are unremarkable. IMPRESSION: Mild bronchitic changes without focal consolidation. Electronically Signed   By: Elon Alas M.D.   On: 02/25/2016 19:03    Physical Exam: BP (!) 106/41   Pulse 65   Temp 97.7 F (36.5 C) (Oral)   Resp 19   Ht 6\' 2"  (1.88 m)   Wt 178 lb (80.7 kg)   SpO2 100%   BMI 22.85 kg/m  Constitutional: Pleasant,well-developed, male in no acute distress.  Cardiovascular: Normal rate, regular rhythm.  Pulmonary/chest: Effort normal and breath sounds normal. No wheezing, rales or rhonchi. Abdominal: Soft, nondistended, nontender.  Extremities: no edema   ASSESSMENT AND PLAN: 76 y/o male here for EGD with banding of esophageal varices, follow up from recent upper GI bleed thought to be due to varices. Discussed risks / benefits and he wishes to proceed.   Halstead Cellar, MD [facility] Pager [phone]

## 2016-03-10 NOTE — Discharge Instructions (Signed)
YOU HAD AN ENDOSCOPIC PROCEDURE TODAY: Refer to the procedure report and other information in the discharge instructions given to you for any specific questions about what was found during the examination. If this information does not answer your questions, please call [place] office at [phone] to clarify.  ° °YOU SHOULD EXPECT: Some feelings of bloating in the abdomen. Passage of more gas than usual. Walking can help get rid of the air that was put into your GI tract during the procedure and reduce the bloating. If you had a lower endoscopy (such as a colonoscopy or flexible sigmoidoscopy) you may notice spotting of blood in your stool or on the toilet paper. Some abdominal soreness may be present for a day or two, also. ° °DIET: Your first meal following the procedure should be a light meal and then it is ok to progress to your normal diet. A half-sandwich or bowl of soup is an example of a good first meal. Heavy or fried foods are harder to digest and may make you feel nauseous or bloated. Drink plenty of fluids but you should avoid alcoholic beverages for 24 hours. If you had a esophageal dilation, please see attached instructions for diet.   ° °ACTIVITY: Your care partner should take you home directly after the procedure. You should plan to take it easy, moving slowly for the rest of the day. You can resume normal activity the day after the procedure however YOU SHOULD NOT DRIVE, use power tools, machinery or perform tasks that involve climbing or major physical exertion for 24 hours (because of the sedation medicines used during the test).  ° °SYMPTOMS TO REPORT IMMEDIATELY: °A gastroenterologist can be reached at any hour. Please call [phone]  for any of the following symptoms:  °Following lower endoscopy (colonoscopy, flexible sigmoidoscopy) °Excessive amounts of blood in the stool  °Significant tenderness, worsening of abdominal pains  °Swelling of the abdomen that is new, acute  °Fever of 100° or  higher  °Following upper endoscopy (EGD, EUS, ERCP, esophageal dilation) °Vomiting of blood or coffee ground material  °New, significant abdominal pain  °New, significant chest pain or pain under the shoulder blades  °Painful or persistently difficult swallowing  °New shortness of breath  °Black, tarry-looking or red, bloody stools ° °FOLLOW UP:  °If any biopsies were taken you will be contacted by phone or by letter within the next 1-3 weeks. Call [phone]  if you have not heard about the biopsies in 3 weeks.  °Please also call with any specific questions about appointments or follow up tests. ° °

## 2016-03-11 ENCOUNTER — Encounter (HOSPITAL_COMMUNITY): Payer: Self-pay | Admitting: Gastroenterology

## 2016-03-11 DIAGNOSIS — I8500 Esophageal varices without bleeding: Secondary | ICD-10-CM | POA: Diagnosis not present

## 2016-03-11 DIAGNOSIS — K7469 Other cirrhosis of liver: Secondary | ICD-10-CM | POA: Diagnosis not present

## 2016-03-17 ENCOUNTER — Other Ambulatory Visit: Payer: Self-pay

## 2016-03-19 ENCOUNTER — Other Ambulatory Visit: Payer: Self-pay

## 2016-03-19 DIAGNOSIS — I8501 Esophageal varices with bleeding: Secondary | ICD-10-CM

## 2016-03-22 DIAGNOSIS — D7389 Other diseases of spleen: Secondary | ICD-10-CM | POA: Diagnosis not present

## 2016-03-22 DIAGNOSIS — R161 Splenomegaly, not elsewhere classified: Secondary | ICD-10-CM | POA: Diagnosis not present

## 2016-03-22 DIAGNOSIS — K7460 Unspecified cirrhosis of liver: Secondary | ICD-10-CM | POA: Diagnosis not present

## 2016-03-22 DIAGNOSIS — K8020 Calculus of gallbladder without cholecystitis without obstruction: Secondary | ICD-10-CM | POA: Diagnosis not present

## 2016-03-22 DIAGNOSIS — K7469 Other cirrhosis of liver: Secondary | ICD-10-CM | POA: Diagnosis not present

## 2016-03-22 DIAGNOSIS — I8500 Esophageal varices without bleeding: Secondary | ICD-10-CM | POA: Diagnosis not present

## 2016-03-22 DIAGNOSIS — R188 Other ascites: Secondary | ICD-10-CM | POA: Diagnosis not present

## 2016-03-24 DIAGNOSIS — R509 Fever, unspecified: Secondary | ICD-10-CM | POA: Diagnosis not present

## 2016-03-24 DIAGNOSIS — J111 Influenza due to unidentified influenza virus with other respiratory manifestations: Secondary | ICD-10-CM | POA: Diagnosis not present

## 2016-03-24 DIAGNOSIS — D62 Acute posthemorrhagic anemia: Secondary | ICD-10-CM | POA: Diagnosis not present

## 2016-03-24 DIAGNOSIS — R05 Cough: Secondary | ICD-10-CM | POA: Diagnosis not present

## 2016-03-25 ENCOUNTER — Telehealth: Payer: Self-pay | Admitting: Gastroenterology

## 2016-03-25 NOTE — Telephone Encounter (Signed)
Patient called and asked that we cancel his EGD on 03/31/16. He wanted to let you know that he is very appreciative of all your help and excellent care. His daughter-in-law has encouraged him to see a doctor in [place], Dr. Jaynie Bream, to further look into his liver issues.

## 2016-03-25 NOTE — Telephone Encounter (Signed)
I called the patient back. His daughter in law works with Dr. Blenda Mounts who is a hepatologist in [place] and his daughter in law is wanting the patient to see hepatology for another opinion. The patient has seen Dr. Patsy Baltimore at [facility] already but did not want to go back there. I'm concerned he's had a recent variceal bleed leading to 2 banding sessions, and failure to follow through with further endoscopic treatment may increase his risk for recurrent bleeding. I told him he should hope to have plans for endoscopy in [place] in the near future if he plans to seek his care there and he understood. He reported he felt obligated to go there given his family situation. He can follow up as needed with [place] if he wishes to have care here.

## 2016-03-30 ENCOUNTER — Telehealth: Payer: Self-pay

## 2016-03-30 NOTE — Telephone Encounter (Signed)
Received fax refill request for Furosemide 20mg  #90. Take one po qd   Received fax refill request for Spironalactone 25mg  #90. Take one po qd.  Are these ok to refill? Thank you.  Refills need to be sent back to [facility] on [address].

## 2016-03-30 NOTE — Telephone Encounter (Signed)
Yes that's fine. Give him #90. I'm not sure where he wishes to get his long term care, he wanted another opinion from Hepatology in [place]. If he ends up following with [place] again in the future we can give him more. Thanks

## 2016-03-31 ENCOUNTER — Other Ambulatory Visit: Payer: Self-pay

## 2016-03-31 ENCOUNTER — Encounter (HOSPITAL_COMMUNITY): Payer: Self-pay

## 2016-03-31 ENCOUNTER — Ambulatory Visit (HOSPITAL_COMMUNITY): Admit: 2016-03-31 | Payer: Medicare Other | Admitting: Gastroenterology

## 2016-03-31 SURGERY — ESOPHAGOGASTRODUODENOSCOPY (EGD) WITH PROPOFOL
Anesthesia: Monitor Anesthesia Care

## 2016-03-31 MED ORDER — FUROSEMIDE 20 MG PO TABS: 20.0000 mg | ORAL_TABLET | Freq: Every day | ORAL | 0 refills | Status: DC

## 2016-03-31 MED ORDER — SPIRONOLACTONE 25 MG PO TABS: 25.0000 mg | ORAL_TABLET | Freq: Every day | ORAL | 0 refills | Status: DC

## 2016-03-31 NOTE — Telephone Encounter (Signed)
Medications filled with #90 and no refills sent to [facility] [address].

## 2016-04-01 DIAGNOSIS — I8500 Esophageal varices without bleeding: Secondary | ICD-10-CM | POA: Diagnosis not present

## 2016-04-01 DIAGNOSIS — G629 Polyneuropathy, unspecified: Secondary | ICD-10-CM | POA: Diagnosis not present

## 2016-04-01 DIAGNOSIS — I10 Essential (primary) hypertension: Secondary | ICD-10-CM | POA: Diagnosis not present

## 2016-04-01 DIAGNOSIS — D649 Anemia, unspecified: Secondary | ICD-10-CM | POA: Diagnosis not present

## 2016-04-01 DIAGNOSIS — K219 Gastro-esophageal reflux disease without esophagitis: Secondary | ICD-10-CM | POA: Diagnosis not present

## 2016-04-01 DIAGNOSIS — I480 Paroxysmal atrial fibrillation: Secondary | ICD-10-CM | POA: Diagnosis not present

## 2016-04-01 DIAGNOSIS — K449 Diaphragmatic hernia without obstruction or gangrene: Secondary | ICD-10-CM | POA: Diagnosis not present

## 2016-04-01 DIAGNOSIS — H9193 Unspecified hearing loss, bilateral: Secondary | ICD-10-CM | POA: Diagnosis not present

## 2016-04-01 DIAGNOSIS — I8510 Secondary esophageal varices without bleeding: Secondary | ICD-10-CM | POA: Diagnosis not present

## 2016-04-01 DIAGNOSIS — K7460 Unspecified cirrhosis of liver: Secondary | ICD-10-CM | POA: Diagnosis not present

## 2016-04-01 DIAGNOSIS — G4733 Obstructive sleep apnea (adult) (pediatric): Secondary | ICD-10-CM | POA: Diagnosis not present

## 2016-04-01 DIAGNOSIS — D696 Thrombocytopenia, unspecified: Secondary | ICD-10-CM | POA: Diagnosis not present

## 2016-04-02 DIAGNOSIS — K7460 Unspecified cirrhosis of liver: Secondary | ICD-10-CM | POA: Diagnosis not present

## 2016-04-02 DIAGNOSIS — K7469 Other cirrhosis of liver: Secondary | ICD-10-CM | POA: Diagnosis not present

## 2016-04-13 DIAGNOSIS — I480 Paroxysmal atrial fibrillation: Secondary | ICD-10-CM | POA: Diagnosis not present

## 2016-04-13 DIAGNOSIS — I2510 Atherosclerotic heart disease of native coronary artery without angina pectoris: Secondary | ICD-10-CM | POA: Diagnosis not present

## 2016-04-13 DIAGNOSIS — K766 Portal hypertension: Secondary | ICD-10-CM | POA: Diagnosis not present

## 2016-04-13 DIAGNOSIS — E785 Hyperlipidemia, unspecified: Secondary | ICD-10-CM | POA: Diagnosis not present

## 2016-05-06 DIAGNOSIS — I4891 Unspecified atrial fibrillation: Secondary | ICD-10-CM | POA: Diagnosis not present

## 2016-05-06 DIAGNOSIS — I10 Essential (primary) hypertension: Secondary | ICD-10-CM | POA: Diagnosis not present

## 2016-05-06 DIAGNOSIS — I8500 Esophageal varices without bleeding: Secondary | ICD-10-CM | POA: Diagnosis not present

## 2016-05-06 DIAGNOSIS — K7460 Unspecified cirrhosis of liver: Secondary | ICD-10-CM | POA: Diagnosis not present

## 2016-05-06 DIAGNOSIS — K219 Gastro-esophageal reflux disease without esophagitis: Secondary | ICD-10-CM | POA: Diagnosis not present

## 2016-05-06 DIAGNOSIS — Z8719 Personal history of other diseases of the digestive system: Secondary | ICD-10-CM | POA: Diagnosis not present

## 2016-05-06 DIAGNOSIS — I8510 Secondary esophageal varices without bleeding: Secondary | ICD-10-CM | POA: Diagnosis not present

## 2016-06-10 DIAGNOSIS — G4733 Obstructive sleep apnea (adult) (pediatric): Secondary | ICD-10-CM | POA: Diagnosis not present

## 2016-06-10 DIAGNOSIS — Z9049 Acquired absence of other specified parts of digestive tract: Secondary | ICD-10-CM | POA: Diagnosis not present

## 2016-06-10 DIAGNOSIS — I480 Paroxysmal atrial fibrillation: Secondary | ICD-10-CM | POA: Diagnosis not present

## 2016-06-10 DIAGNOSIS — K219 Gastro-esophageal reflux disease without esophagitis: Secondary | ICD-10-CM | POA: Diagnosis not present

## 2016-06-10 DIAGNOSIS — I8510 Secondary esophageal varices without bleeding: Secondary | ICD-10-CM | POA: Diagnosis not present

## 2016-06-10 DIAGNOSIS — I8500 Esophageal varices without bleeding: Secondary | ICD-10-CM | POA: Diagnosis not present

## 2016-06-10 DIAGNOSIS — Z85038 Personal history of other malignant neoplasm of large intestine: Secondary | ICD-10-CM | POA: Diagnosis not present

## 2016-06-10 DIAGNOSIS — D696 Thrombocytopenia, unspecified: Secondary | ICD-10-CM | POA: Diagnosis not present

## 2016-06-10 DIAGNOSIS — I10 Essential (primary) hypertension: Secondary | ICD-10-CM | POA: Diagnosis not present

## 2016-06-10 DIAGNOSIS — K7460 Unspecified cirrhosis of liver: Secondary | ICD-10-CM | POA: Diagnosis not present

## 2016-07-13 DIAGNOSIS — K7469 Other cirrhosis of liver: Secondary | ICD-10-CM | POA: Diagnosis not present

## 2016-07-13 DIAGNOSIS — I8500 Esophageal varices without bleeding: Secondary | ICD-10-CM | POA: Diagnosis not present

## 2016-07-14 DIAGNOSIS — I8500 Esophageal varices without bleeding: Secondary | ICD-10-CM | POA: Diagnosis not present

## 2016-07-14 DIAGNOSIS — K7469 Other cirrhosis of liver: Secondary | ICD-10-CM | POA: Diagnosis not present

## 2016-07-14 DIAGNOSIS — K8020 Calculus of gallbladder without cholecystitis without obstruction: Secondary | ICD-10-CM | POA: Diagnosis not present

## 2016-07-14 DIAGNOSIS — K7460 Unspecified cirrhosis of liver: Secondary | ICD-10-CM | POA: Diagnosis not present

## 2016-08-09 DIAGNOSIS — K8020 Calculus of gallbladder without cholecystitis without obstruction: Secondary | ICD-10-CM | POA: Diagnosis not present

## 2016-08-09 DIAGNOSIS — R161 Splenomegaly, not elsewhere classified: Secondary | ICD-10-CM | POA: Diagnosis not present

## 2016-08-09 DIAGNOSIS — K7460 Unspecified cirrhosis of liver: Secondary | ICD-10-CM | POA: Diagnosis not present

## 2016-09-13 DIAGNOSIS — I8500 Esophageal varices without bleeding: Secondary | ICD-10-CM | POA: Diagnosis not present

## 2016-09-13 DIAGNOSIS — K7469 Other cirrhosis of liver: Secondary | ICD-10-CM | POA: Diagnosis not present

## 2016-09-15 DIAGNOSIS — I8500 Esophageal varices without bleeding: Secondary | ICD-10-CM | POA: Diagnosis not present

## 2016-09-15 DIAGNOSIS — K7469 Other cirrhosis of liver: Secondary | ICD-10-CM | POA: Diagnosis not present

## 2016-09-15 DIAGNOSIS — K8020 Calculus of gallbladder without cholecystitis without obstruction: Secondary | ICD-10-CM | POA: Diagnosis not present

## 2016-09-15 DIAGNOSIS — K7460 Unspecified cirrhosis of liver: Secondary | ICD-10-CM | POA: Diagnosis not present

## 2016-09-21 ENCOUNTER — Emergency Department
Admission: EM | Admit: 2016-09-21 | Discharge: 2016-09-21 | Disposition: A | Discharge status: Home / Self Care | Payer: Medicare Other | Source: Home / Self Care | Attending: Family Medicine | Admitting: Family Medicine

## 2016-09-21 ENCOUNTER — Encounter: Payer: Self-pay | Admitting: *Deleted

## 2016-09-21 DIAGNOSIS — T162XXA Foreign body in left ear, initial encounter: Secondary | ICD-10-CM

## 2016-09-21 NOTE — ED Notes (Signed)
Removed plastic foreign body from LT ear. Charna Archer, LPN

## 2016-09-21 NOTE — ED Triage Notes (Signed)
Pt reports that he has a plastic piece from his hearing aid stuck in his LT ear since this afternoon.

## 2016-09-21 NOTE — Discharge Instructions (Signed)
Call if ear pain or drainage develops.

## 2016-09-21 NOTE — ED Provider Notes (Signed)
[name]    CSN: [id number] Arrival date & time: 09/21/16  1816     History   Chief Complaint Chief Complaint  Patient presents with  . [name] is a 76 y.o. male.   Patient reports that a plastic earpiece from his hearing aid became lodged in his left ear canal earlier today.  He denies ear pain.   The history is provided by the patient.  Foreign Body in Ear  This is a new problem. The current episode started 3 to 5 hours ago. The problem occurs constantly. The problem has not changed since onset.Pertinent negatives include no headaches. Nothing aggravates the symptoms. Nothing relieves the symptoms. He has tried nothing for the symptoms.    Past Medical History:  Diagnosis Date  . Anemia   . Arthritis    "thumbs" (11/29/2013)  . Atrial fibrillation [facility]) 2006   Dr Tonny Bollman af-no meds  . colon ca dx'd 02/2011   COLON CANCER  . Diverticulosis    Sigmoid colon  . Esophageal stricture   . Esophageal varices determined by endoscopy ([place]) sept 2016  . Fracture of distal femur ([place]) 11/29/2013  . GERD (gastroesophageal reflux disease) 03-31-11   tx. Omeprazole  . Hearing loss    has bilateral hearing aids  . Hernia, abdominal    at the umbilicus  . History of hemorrhoids   . History of irregular heartbeat   . Hypercholesterolemia   . Hyperlipidemia   . Neuropathy 2013   as a result of chemo  . OSA on CPAP dx'd 2010   uses cap nightly  . Oxygen deficiency   . Personal history of colonic polyps 09/17/2009   Tubular adenoma  . Pneumonia 01/2013  . 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 Sleep apnea    uses c pap at home  . TBI (traumatic brain injury) ([place]) 1986   S/P fall; "slow to get going q morning" (10//22/2015)    Patient Active Problem List   Diagnosis Date Noted  . Bleeding esophageal varices ([place])   . Acute GI bleeding   .  Shortness of breath at rest   . Upper GI bleed 02/25/2016  . History of colon cancer 02/25/2016  . Thrombocytopenia ([place]) 02/25/2016  . History of esophageal varices   . Maxillary sinusitis, acute 05/06/2015  . Fibrosis of liver   . 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 Fracture of distal femur ([place]) 11/29/2013  . CAP (community acquired pneumonia) 02/12/2013  . Viral URI with cough 01/24/2013  . CAD (coronary artery disease)   . Nephrolithiasis   . Malignant neoplasm of ascending colon ([place]) 03/03/2011  . 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 Disorder of bilirubin excretion   . Obstructive sleep apnea 12/14/2007  . HYPERGLYCEMIA, FASTING 12/13/2007  . Atrial fibrillation ([place]) 10/03/2007  . Hyperlipidemia 09/22/2006  . 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 BPH (benign prostatic hypertrophy)     Past Surgical History:  Procedure Laterality Date  . APPENDECTOMY  03/2011  . CARDIAC CATHETERIZATION  07/2002   Dr Wynonia Lawman  . COLON SURGERY  2013  . COLONOSCOPY  sept 2016   upper endo and colonoscopy  . ESOPHAGEAL DILATION  2006  . ESOPHAGOGASTRODUODENOSCOPY (EGD) WITH PROPOFOL N/A 02/25/2016   Procedure: ESOPHAGOGASTRODUODENOSCOPY (EGD) WITH PROPOFOL;  Surgeon: Manus Gunning, MD;  Location: [facility];  Service: Gastroenterology;  Laterality: N/A;  .  ESOPHAGOGASTRODUODENOSCOPY (EGD) WITH PROPOFOL N/A 03/10/2016   Procedure: ESOPHAGOGASTRODUODENOSCOPY (EGD) WITH PROPOFOL;  Surgeon: Manus Gunning, MD;  Location: [facility];  Service: Gastroenterology;  Laterality: N/A;  . GASTRIC VARICES BANDING N/A 03/10/2016   Procedure: GASTRIC VARICES BANDING;  Surgeon: Manus Gunning, MD;  Location: [facility];  Service: Gastroenterology;  Laterality: N/A;  . INGUINAL HERNIA REPAIR Left ~ 1991  . LAPAROSCOPIC PARTIAL COLECTOMY  04/08/2011  . ORIF DISTAL FEMUR FRACTURE Right 11/29/2013   "put plate in"  . ORIF FEMUR FRACTURE Right 11/29/2013    Procedure: OPEN REDUCTION INTERNAL FIXATION (ORIF) RIGHT  DISTAL FEMUR FRACTURE;  Surgeon: Rozanna Box, MD;  Location: [place];  Service: Orthopedics;  Laterality: Right;  . PORT-A-CATH REMOVAL  11/09/2011   Procedure: REMOVAL PORT-A-CATH;  Surgeon: Odis Hollingshead, MD;  Location: [place];  Service: General;  Laterality: N/A;  . PORTACATH PLACEMENT  04/30/2011   Procedure: INSERTION PORT-A-CATH;  Surgeon: Odis Hollingshead, MD;  Location: [place];  Service: General;  Laterality: Right;       Home Medications    Prior to Admission medications   Medication Sig Start Date End Date Taking? Authorizing Provider  amphetamine-dextroamphetamine (ADDERALL) 10 MG tablet Take 10-20 mg by mouth 2 (two) times daily with a meal. Pt normally takes 20mg  in the AM and 10mg  in the PM . Varies as needed how many he takes at one time. 12/30/10   [provider]  Carboxymethylcellulose Sodium (REFRESH LIQUIGEL OP) Place 1 drop into both eyes daily as needed (dry eyes).    [provider]  ferrous sulfate 325 (65 FE) MG tablet Take 325 mg by mouth 2 (two) times daily.    [provider]  furosemide (LASIX) 20 MG tablet Take 1 tablet (20 mg total) by mouth daily. 03/31/16   Armbruster, Renelda Loma, MD  gabapentin (NEURONTIN) 100 MG capsule One by mouth twice a day Patient taking differently: Take 100 mg by mouth 2 (two) times daily.  04/16/14   Dorena Cookey, MD  pantoprazole (PROTONIX) 40 MG tablet Take 1 tablet (40 mg total) by mouth 2 (two) times daily. 02/29/16   Donne Hazel, MD  propranolol (INDERAL) 20 MG tablet Take 1 tablet (20 mg total) by mouth 2 (two) times daily. 02/29/16   Donne Hazel, MD  sildenafil (VIAGRA) 100 MG tablet Take 1 tablet (100 mg total) by mouth daily as needed for erectile dysfunction. 04/16/14   Dorena Cookey, MD  simvastatin (ZOCOR) 20 MG tablet Take 20 mg by mouth daily.    [provider]  spironolactone (ALDACTONE) 25 MG  tablet Take 1 tablet (25 mg total) by mouth daily. 03/31/16   Armbruster, Renelda Loma, MD    Family History Family History  Problem Relation Age of Onset  . Diabetes Father   . Prostate cancer Father   . Heart attack Father 14  . Heart failure Mother        Congestive heart failure  . Cancer Brother        colon  . Colon cancer Brother 42  . Stomach cancer Sister   . Colon cancer Other   . Colon cancer Sister   . Coronary artery disease Brother        CABG  . Lung cancer Brother   . Anesthesia problems Neg Hx   . Esophageal cancer Neg Hx     Social History Social History  Substance Use Topics  . Smoking status:  Former Smoker    Packs/day: 0.50    Years: 7.00    Types: Cigarettes    Quit date: 03/21/1961  . Smokeless tobacco: Never Used  . Alcohol use No     Allergies   Patient has no known allergies.   Review of Systems Review of Systems  HENT: Negative for ear discharge and ear pain.   Neurological: Negative for headaches.  All other systems reviewed and are negative.    Physical Exam Triage Vital Signs ED Triage Vitals  Enc Vitals Group     BP 09/21/16 1832 (!) 148/70     Pulse Rate 09/21/16 1832 64     Resp 09/21/16 1832 16     Temp 09/21/16 1832 97.7 F (36.5 C)     Temp Source 09/21/16 1832 Oral     SpO2 09/21/16 1832 99 %     Weight 09/21/16 1833 184 lb (83.5 kg)     Height 09/21/16 1833 6\' 2"  (1.88 m)     Head Circumference --      Peak Flow --      Pain Score 09/21/16 1833 0     Pain Loc --      Pain Edu? --      Excl. in [place]? --    No data found.   Updated Vital Signs BP (!) 148/70 (BP Location: Left Arm)   Pulse 64   Temp 97.7 F (36.5 C) (Oral)   Resp 16   Ht 6\' 2"  (1.88 m)   Wt 184 lb (83.5 kg)   SpO2 99%   BMI 23.62 kg/m   Visual Acuity Right Eye Distance:   Left Eye Distance:   Bilateral Distance:    Right Eye Near:   Left Eye Near:    Bilateral Near:     Physical Exam  Constitutional: He appears well-developed  and well-nourished. No distress.  HENT:  Head: Normocephalic.  Left Ear: A foreign body is present.  Nose: Nose normal.  Left canal reveals small plastic earpiece lodged in distal canal.  Post removal, tympanic membrane and ear canal appear normal.  Distal canal slightly erythematous.  Eyes: Pupils are equal, round, and reactive to light.  Cardiovascular: Normal rate.   Pulmonary/Chest: Effort normal.  Neurological: He is alert.  Skin: Skin is warm and dry.  Nursing note and vitals reviewed.        UC Treatments / Results  Labs (all labs ordered are listed, but only abnormal results are displayed) Labs Reviewed - No data to display  EKG  EKG Interpretation None       Radiology No results found.  Procedures Procedures  Foreign body removal left ear canal:  Small plastic ear piece successfully removed without difficulty.  Medications Ordered in UC Medications - No data to display   Initial Impression / Assessment and Plan / UC Course  I have reviewed the triage vital signs and the nursing notes.  Pertinent labs & imaging results that were available during my care of the patient were reviewed by me and considered in my medical decision making (see chart for details).    Call if ear pain or drainage develops. Recommend follow-up with hearing aid specialist to re-evaluate earpiece tips.    Final Clinical Impressions(s) / UC Diagnoses   Final diagnoses:  Foreign body of left ear, initial encounter    New Prescriptions New Prescriptions   No medications on file        Kandra Nicolas, MD 09/21/16 [phone]

## 2016-09-29 DIAGNOSIS — F329 Major depressive disorder, single episode, unspecified: Secondary | ICD-10-CM | POA: Diagnosis not present

## 2016-10-19 DIAGNOSIS — I2510 Atherosclerotic heart disease of native coronary artery without angina pectoris: Secondary | ICD-10-CM | POA: Diagnosis not present

## 2016-10-19 DIAGNOSIS — E785 Hyperlipidemia, unspecified: Secondary | ICD-10-CM | POA: Diagnosis not present

## 2016-10-19 DIAGNOSIS — K766 Portal hypertension: Secondary | ICD-10-CM | POA: Diagnosis not present

## 2016-10-19 DIAGNOSIS — G4733 Obstructive sleep apnea (adult) (pediatric): Secondary | ICD-10-CM | POA: Diagnosis not present

## 2016-10-27 DIAGNOSIS — Z125 Encounter for screening for malignant neoplasm of prostate: Secondary | ICD-10-CM | POA: Diagnosis not present

## 2016-10-27 DIAGNOSIS — E784 Other hyperlipidemia: Secondary | ICD-10-CM | POA: Diagnosis not present

## 2016-10-27 DIAGNOSIS — Z79899 Other long term (current) drug therapy: Secondary | ICD-10-CM | POA: Diagnosis not present

## 2016-10-27 DIAGNOSIS — D509 Iron deficiency anemia, unspecified: Secondary | ICD-10-CM | POA: Diagnosis not present

## 2016-11-02 DIAGNOSIS — I8500 Esophageal varices without bleeding: Secondary | ICD-10-CM | POA: Diagnosis not present

## 2016-11-02 DIAGNOSIS — R7301 Impaired fasting glucose: Secondary | ICD-10-CM | POA: Diagnosis not present

## 2016-11-02 DIAGNOSIS — Z0000 Encounter for general adult medical examination without abnormal findings: Secondary | ICD-10-CM | POA: Diagnosis not present

## 2016-11-02 DIAGNOSIS — K219 Gastro-esophageal reflux disease without esophagitis: Secondary | ICD-10-CM | POA: Diagnosis not present

## 2016-11-09 DIAGNOSIS — Z1212 Encounter for screening for malignant neoplasm of rectum: Secondary | ICD-10-CM | POA: Diagnosis not present

## 2016-11-18 DIAGNOSIS — K219 Gastro-esophageal reflux disease without esophagitis: Secondary | ICD-10-CM | POA: Diagnosis not present

## 2016-11-18 DIAGNOSIS — Z1211 Encounter for screening for malignant neoplasm of colon: Secondary | ICD-10-CM | POA: Diagnosis not present

## 2016-11-18 DIAGNOSIS — D696 Thrombocytopenia, unspecified: Secondary | ICD-10-CM | POA: Diagnosis not present

## 2016-11-18 DIAGNOSIS — Z980 Intestinal bypass and anastomosis status: Secondary | ICD-10-CM | POA: Diagnosis not present

## 2016-11-18 DIAGNOSIS — I8510 Secondary esophageal varices without bleeding: Secondary | ICD-10-CM | POA: Diagnosis not present

## 2016-11-18 DIAGNOSIS — D126 Benign neoplasm of colon, unspecified: Secondary | ICD-10-CM | POA: Diagnosis not present

## 2016-11-18 DIAGNOSIS — I8500 Esophageal varices without bleeding: Secondary | ICD-10-CM | POA: Diagnosis not present

## 2016-11-18 DIAGNOSIS — K7460 Unspecified cirrhosis of liver: Secondary | ICD-10-CM | POA: Diagnosis not present

## 2016-11-18 DIAGNOSIS — K5140 Inflammatory polyps of colon without complications: Secondary | ICD-10-CM | POA: Diagnosis not present

## 2016-11-18 DIAGNOSIS — K641 Second degree hemorrhoids: Secondary | ICD-10-CM | POA: Diagnosis not present

## 2016-11-18 DIAGNOSIS — K5730 Diverticulosis of large intestine without perforation or abscess without bleeding: Secondary | ICD-10-CM | POA: Diagnosis not present

## 2016-11-18 DIAGNOSIS — Z86010 Personal history of colonic polyps: Secondary | ICD-10-CM | POA: Diagnosis not present

## 2016-11-22 ENCOUNTER — Encounter: Payer: Self-pay | Admitting: Gastroenterology

## 2016-12-01 DIAGNOSIS — H2513 Age-related nuclear cataract, bilateral: Secondary | ICD-10-CM | POA: Diagnosis not present

## 2017-01-20 DIAGNOSIS — I8510 Secondary esophageal varices without bleeding: Secondary | ICD-10-CM | POA: Diagnosis not present

## 2017-01-20 DIAGNOSIS — K219 Gastro-esophageal reflux disease without esophagitis: Secondary | ICD-10-CM | POA: Diagnosis not present

## 2017-01-20 DIAGNOSIS — G4733 Obstructive sleep apnea (adult) (pediatric): Secondary | ICD-10-CM | POA: Diagnosis not present

## 2017-01-20 DIAGNOSIS — K7460 Unspecified cirrhosis of liver: Secondary | ICD-10-CM | POA: Diagnosis not present

## 2017-01-20 DIAGNOSIS — Z9221 Personal history of antineoplastic chemotherapy: Secondary | ICD-10-CM | POA: Diagnosis not present

## 2017-01-20 DIAGNOSIS — Z85038 Personal history of other malignant neoplasm of large intestine: Secondary | ICD-10-CM | POA: Diagnosis not present

## 2017-01-20 DIAGNOSIS — I8500 Esophageal varices without bleeding: Secondary | ICD-10-CM | POA: Diagnosis not present

## 2017-01-20 DIAGNOSIS — I4891 Unspecified atrial fibrillation: Secondary | ICD-10-CM | POA: Diagnosis not present

## 2017-01-20 DIAGNOSIS — Z87891 Personal history of nicotine dependence: Secondary | ICD-10-CM | POA: Diagnosis not present

## 2017-01-20 DIAGNOSIS — I10 Essential (primary) hypertension: Secondary | ICD-10-CM | POA: Diagnosis not present

## 2017-02-15 ENCOUNTER — Inpatient Hospital Stay: Payer: Medicare Other | Attending: Oncology | Admitting: Oncology

## 2017-02-15 ENCOUNTER — Inpatient Hospital Stay: Payer: Medicare Other

## 2017-02-15 ENCOUNTER — Telehealth: Payer: Self-pay | Admitting: Oncology

## 2017-02-15 ENCOUNTER — Encounter: Payer: Self-pay | Admitting: Oncology

## 2017-02-15 VITALS — BP 128/59 | HR 66 | Temp 98.0°F | Resp 20 | Ht 74.0 in | Wt 184.7 lb

## 2017-02-15 DIAGNOSIS — D709 Neutropenia, unspecified: Secondary | ICD-10-CM | POA: Insufficient documentation

## 2017-02-15 DIAGNOSIS — Z800 Family history of malignant neoplasm of digestive organs: Secondary | ICD-10-CM | POA: Insufficient documentation

## 2017-02-15 DIAGNOSIS — R5383 Other fatigue: Secondary | ICD-10-CM | POA: Diagnosis not present

## 2017-02-15 DIAGNOSIS — D696 Thrombocytopenia, unspecified: Secondary | ICD-10-CM | POA: Insufficient documentation

## 2017-02-15 DIAGNOSIS — I4891 Unspecified atrial fibrillation: Secondary | ICD-10-CM | POA: Insufficient documentation

## 2017-02-15 DIAGNOSIS — D509 Iron deficiency anemia, unspecified: Secondary | ICD-10-CM | POA: Diagnosis not present

## 2017-02-15 DIAGNOSIS — Z8781 Personal history of (healed) traumatic fracture: Secondary | ICD-10-CM | POA: Diagnosis not present

## 2017-02-15 DIAGNOSIS — G620 Drug-induced polyneuropathy: Secondary | ICD-10-CM | POA: Insufficient documentation

## 2017-02-15 DIAGNOSIS — Z86010 Personal history of colonic polyps: Secondary | ICD-10-CM | POA: Diagnosis not present

## 2017-02-15 DIAGNOSIS — K766 Portal hypertension: Secondary | ICD-10-CM | POA: Diagnosis not present

## 2017-02-15 DIAGNOSIS — Z9221 Personal history of antineoplastic chemotherapy: Secondary | ICD-10-CM | POA: Diagnosis not present

## 2017-02-15 DIAGNOSIS — G4730 Sleep apnea, unspecified: Secondary | ICD-10-CM | POA: Diagnosis not present

## 2017-02-15 DIAGNOSIS — R161 Splenomegaly, not elsewhere classified: Secondary | ICD-10-CM | POA: Insufficient documentation

## 2017-02-15 DIAGNOSIS — Z85038 Personal history of other malignant neoplasm of large intestine: Secondary | ICD-10-CM | POA: Diagnosis not present

## 2017-02-15 DIAGNOSIS — Z8719 Personal history of other diseases of the digestive system: Secondary | ICD-10-CM | POA: Diagnosis not present

## 2017-02-15 DIAGNOSIS — K439 Ventral hernia without obstruction or gangrene: Secondary | ICD-10-CM | POA: Diagnosis not present

## 2017-02-15 DIAGNOSIS — C182 Malignant neoplasm of ascending colon: Secondary | ICD-10-CM

## 2017-02-15 LAB — CBC WITH DIFFERENTIAL/PLATELET
ABS GRANULOCYTE: 2.4 10*3/uL (ref 1.5–6.5)
Basophils Absolute: 0 10*3/uL (ref 0.0–0.1)
Basophils Relative: 0 %
Eosinophils Absolute: 0.1 10*3/uL (ref 0.0–0.5)
Eosinophils Relative: 2 %
HEMATOCRIT: 39.1 % (ref 38.4–49.9)
HEMOGLOBIN: 13.5 g/dL (ref 13.0–17.1)
LYMPHS PCT: 23 %
Lymphs Abs: 0.8 10*3/uL — ABNORMAL LOW (ref 0.9–3.3)
MCH: 29.8 pg (ref 27.2–33.4)
MCHC: 34.5 g/dL (ref 32.0–36.0)
MCV: 86.3 fL (ref 79.3–98.0)
MONO ABS: 0.3 10*3/uL (ref 0.1–0.9)
Monocytes Relative: 8 %
NEUTROS ABS: 2.4 10*3/uL (ref 1.5–6.5)
Neutrophils Relative %: 67 %
Platelets: 86 10*3/uL — ABNORMAL LOW (ref 140–400)
RBC: 4.53 MIL/uL (ref 4.20–5.82)
RDW: 14.7 % (ref 11.0–15.6)
WBC: 3.5 10*3/uL — AB (ref 4.0–10.3)

## 2017-02-15 LAB — CEA (IN HOUSE-CHCC): CEA (CHCC-In House): 1.22 ng/mL (ref 0.00–5.00)

## 2017-02-15 NOTE — Telephone Encounter (Signed)
No 1/8 los.  

## 2017-02-15 NOTE — Progress Notes (Signed)
[facility] OFFICE PROGRESS NOTE   Diagnosis: Colon cancer  INTERVAL HISTORY:   Mr. Hollett returns as scheduled.  He was admitted in January 2018 with GI bleeding.  He was found to have esophageal varices, suspected to be the source of bleeding.  He is now followed by GI and [facility] and reports undergoing repeat esophageal banding procedures.  He also underwent a colonoscopy in [place] last fall.  Mr. Stephanie continues to have neuropathy symptoms in the hands and feet.  He has developed a tremor of the right hand and lack of coordination of the right upper extremity.  He reports a decreased energy level. Objective:  Vital signs in last 24 hours:  Blood pressure (!) 128/59, pulse 66, temperature 98 F (36.7 C), temperature source Oral, resp. rate 20, height '6\' 2"'$  (1.88 m), weight 184 lb 11.2 oz (83.8 kg), SpO2 100 %.    HEENT: Neck without mass Lymphatics: No cervical, supraclavicular, axillary, or inguinal nodes Resp: Lungs clear bilaterally Cardio: Regular rate and rhythm GI: No hepatosplenomegaly, no mass, nontender Vascular: No leg edema Neuro: Mild resting tremor of the right hand, the motor exam appears intact in the upper extremities bilaterally    Lab Results:  Lab Results  Component Value Date   WBC 3.5 (L) 02/15/2017   HGB 13.5 02/15/2017   HCT 39.1 02/15/2017   MCV 86.3 02/15/2017   PLT 86 (L) 02/15/2017   NEUTROABS 2.4 02/15/2017     Lab Results  Component Value Date   CEA1 1.22 02/15/2017    Medications: I have reviewed the patient's current medications.   Assessment/Plan: 1. Moderately differentiated adenocarcinoma of the cecum, stage IIIB (T4 N1), status post a right colectomy on 04/08/2011 -the tumor was microsatellite stable and K-ras wild-type . He began adjuvant CAPOX chemotherapy on 05/03/2011. He completed cycle 5 on 07/26/2011 (oxaliplatin was held from cycle 4). He completed cycle 7 beginning 09/06/2011. He completed cycle  8 beginning 09/27/2011.   CTs of the chest, abdomen, and pelvis 05/15/2013-negative for recurrent colon cancer  CTs of the chest, abdomen, and pelvis 06/12/2014-negative for recurrent colon cancer 2. History of colon polyps. He underwent a surveillance colonoscopy 11/07/2015 3. History of anemia secondary to iron deficiency and chemotherapy. 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 Postoperative abdominal wall/flank hematoma.  5. Sleep apnea.  6. History of atrial fibrillation.  7. History of esophageal stricture.  8. Family history of colon cancer and gastric cancer. The tumor was microsatellite stable and no mutation was found in the mismatch repair genes. A CancerNext panel was negative.  9. 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 Neutropenia/thrombocytopenia secondary to chemotherapy-he has chronic mild thrombocytopenia-likely secondary to Portal hypertension 12. Abdominal wall hernia noted on a CT of the abdomen 12/03/2011.  13. Splenomegaly-noted on CT scans back to January of 2013.  14. Stool positive for occult blood 02/12/2013. Ferritin 38 02/14/2013. Colonoscopy and upper endoscopy 03/07/2013 with no source for GI blood loss identified. Capsule endoscopy 03/20/2013 negative. Upper endoscopy September 2016 confirmed esophagus varices and portal hypertensive gastropathy. 15. Colonoscopy on 03/07/2013. At the anastomosis along the suture line there were several areas of prominent mucosa. Biopsies were taken. Moderate diverticulosis was noted in the sigmoid colon. Internal hemorrhoids also noted. No definite source for GI blood loss was identified. Biopsy of the anastomosis site showed benign colonic mucosa with inflamed granulation tissue. No adenomatous change or malignancy. He subsequently  underwent an upper endoscopy (03/07/2013) which was normal.  16. Right  distal femur fracture October 2015 status post surgery. 14. 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  Admission with acute GI bleeding secondary to esophagus varices January 2018-now followed by gastroenterology in [place] with repeat esophageal banding 19.  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 Zawislak remains in clinical remission from colon cancer.  He would like to continue follow-up at the Cancer center.  He will return for an office visit and CEA in 1 year. He is now followed by gastroenterology in [place] for management of portal hypertension and esophageal varices.  He reports he undergoes surveillance endoscopy and colonoscopy in [place]. He has developed a tremor and weakness/loss of coordination in the right upper extremity.  I recommended he schedule an appointment with Dr. Jannifer Franklin.  He has persistent symptoms of oxaliplatin neuropathy.  Betsy Coder, MD  02/15/2017  2:04 PM

## 2017-02-21 ENCOUNTER — Telehealth: Payer: Self-pay | Admitting: Oncology

## 2017-02-21 NOTE — Telephone Encounter (Signed)
Scheduled appt per [facility] - no los but lab and f/u in one year per MD notes .

## 2017-02-23 DIAGNOSIS — F329 Major depressive disorder, single episode, unspecified: Secondary | ICD-10-CM | POA: Diagnosis not present

## 2017-02-24 DIAGNOSIS — G4733 Obstructive sleep apnea (adult) (pediatric): Secondary | ICD-10-CM | POA: Diagnosis not present

## 2017-02-24 DIAGNOSIS — I10 Essential (primary) hypertension: Secondary | ICD-10-CM | POA: Diagnosis not present

## 2017-02-24 DIAGNOSIS — Z9221 Personal history of antineoplastic chemotherapy: Secondary | ICD-10-CM | POA: Diagnosis not present

## 2017-02-24 DIAGNOSIS — I8500 Esophageal varices without bleeding: Secondary | ICD-10-CM | POA: Diagnosis not present

## 2017-02-24 DIAGNOSIS — Z85038 Personal history of other malignant neoplasm of large intestine: Secondary | ICD-10-CM | POA: Diagnosis not present

## 2017-02-24 DIAGNOSIS — K219 Gastro-esophageal reflux disease without esophagitis: Secondary | ICD-10-CM | POA: Diagnosis not present

## 2017-02-24 DIAGNOSIS — I8510 Secondary esophageal varices without bleeding: Secondary | ICD-10-CM | POA: Diagnosis not present

## 2017-02-24 DIAGNOSIS — K7460 Unspecified cirrhosis of liver: Secondary | ICD-10-CM | POA: Diagnosis not present

## 2017-05-11 DIAGNOSIS — G629 Polyneuropathy, unspecified: Secondary | ICD-10-CM | POA: Diagnosis not present

## 2017-05-11 DIAGNOSIS — R7301 Impaired fasting glucose: Secondary | ICD-10-CM | POA: Diagnosis not present

## 2017-05-11 DIAGNOSIS — E7849 Other hyperlipidemia: Secondary | ICD-10-CM | POA: Diagnosis not present

## 2017-05-11 DIAGNOSIS — F909 Attention-deficit hyperactivity disorder, unspecified type: Secondary | ICD-10-CM | POA: Diagnosis not present

## 2017-05-17 DIAGNOSIS — K766 Portal hypertension: Secondary | ICD-10-CM | POA: Diagnosis not present

## 2017-05-17 DIAGNOSIS — I2510 Atherosclerotic heart disease of native coronary artery without angina pectoris: Secondary | ICD-10-CM | POA: Diagnosis not present

## 2017-05-17 DIAGNOSIS — E785 Hyperlipidemia, unspecified: Secondary | ICD-10-CM | POA: Diagnosis not present

## 2017-05-17 DIAGNOSIS — G4733 Obstructive sleep apnea (adult) (pediatric): Secondary | ICD-10-CM | POA: Diagnosis not present

## 2017-07-14 DIAGNOSIS — F329 Major depressive disorder, single episode, unspecified: Secondary | ICD-10-CM | POA: Diagnosis not present

## 2017-07-22 DIAGNOSIS — Z79899 Other long term (current) drug therapy: Secondary | ICD-10-CM | POA: Diagnosis not present

## 2017-07-22 DIAGNOSIS — K7460 Unspecified cirrhosis of liver: Secondary | ICD-10-CM | POA: Diagnosis not present

## 2017-07-22 DIAGNOSIS — I8500 Esophageal varices without bleeding: Secondary | ICD-10-CM | POA: Diagnosis not present

## 2017-07-22 DIAGNOSIS — Z85038 Personal history of other malignant neoplasm of large intestine: Secondary | ICD-10-CM | POA: Diagnosis not present

## 2017-07-22 DIAGNOSIS — K449 Diaphragmatic hernia without obstruction or gangrene: Secondary | ICD-10-CM | POA: Diagnosis not present

## 2017-07-22 DIAGNOSIS — D649 Anemia, unspecified: Secondary | ICD-10-CM | POA: Diagnosis not present

## 2017-07-22 DIAGNOSIS — K219 Gastro-esophageal reflux disease without esophagitis: Secondary | ICD-10-CM | POA: Diagnosis not present

## 2017-07-22 DIAGNOSIS — K766 Portal hypertension: Secondary | ICD-10-CM | POA: Diagnosis not present

## 2017-07-22 DIAGNOSIS — G4733 Obstructive sleep apnea (adult) (pediatric): Secondary | ICD-10-CM | POA: Diagnosis not present

## 2017-07-22 DIAGNOSIS — K3189 Other diseases of stomach and duodenum: Secondary | ICD-10-CM | POA: Diagnosis not present

## 2017-07-22 DIAGNOSIS — I499 Cardiac arrhythmia, unspecified: Secondary | ICD-10-CM | POA: Diagnosis not present

## 2017-07-28 DIAGNOSIS — M25562 Pain in left knee: Secondary | ICD-10-CM | POA: Diagnosis not present

## 2017-07-28 DIAGNOSIS — M25561 Pain in right knee: Secondary | ICD-10-CM | POA: Diagnosis not present

## 2017-08-05 DIAGNOSIS — T1512XA Foreign body in conjunctival sac, left eye, initial encounter: Secondary | ICD-10-CM | POA: Diagnosis not present

## 2017-08-09 DIAGNOSIS — T1512XD Foreign body in conjunctival sac, left eye, subsequent encounter: Secondary | ICD-10-CM | POA: Diagnosis not present

## 2017-08-09 DIAGNOSIS — K7469 Other cirrhosis of liver: Secondary | ICD-10-CM | POA: Diagnosis not present

## 2017-08-18 DIAGNOSIS — R162 Hepatomegaly with splenomegaly, not elsewhere classified: Secondary | ICD-10-CM | POA: Diagnosis not present

## 2017-08-18 DIAGNOSIS — K7460 Unspecified cirrhosis of liver: Secondary | ICD-10-CM | POA: Diagnosis not present

## 2017-08-18 DIAGNOSIS — K8020 Calculus of gallbladder without cholecystitis without obstruction: Secondary | ICD-10-CM | POA: Diagnosis not present

## 2017-09-01 DIAGNOSIS — Z800 Family history of malignant neoplasm of digestive organs: Secondary | ICD-10-CM | POA: Diagnosis not present

## 2017-09-01 DIAGNOSIS — K7460 Unspecified cirrhosis of liver: Secondary | ICD-10-CM | POA: Diagnosis not present

## 2017-09-01 DIAGNOSIS — Z86010 Personal history of colonic polyps: Secondary | ICD-10-CM | POA: Diagnosis not present

## 2017-09-01 DIAGNOSIS — K7469 Other cirrhosis of liver: Secondary | ICD-10-CM | POA: Diagnosis not present

## 2017-09-01 DIAGNOSIS — I8500 Esophageal varices without bleeding: Secondary | ICD-10-CM | POA: Diagnosis not present

## 2017-09-29 DIAGNOSIS — D1721 Benign lipomatous neoplasm of skin and subcutaneous tissue of right arm: Secondary | ICD-10-CM | POA: Diagnosis not present

## 2017-09-29 DIAGNOSIS — L821 Other seborrheic keratosis: Secondary | ICD-10-CM | POA: Diagnosis not present

## 2017-09-29 DIAGNOSIS — D485 Neoplasm of uncertain behavior of skin: Secondary | ICD-10-CM | POA: Diagnosis not present

## 2017-09-29 DIAGNOSIS — D1801 Hemangioma of skin and subcutaneous tissue: Secondary | ICD-10-CM | POA: Diagnosis not present

## 2017-09-29 DIAGNOSIS — C44612 Basal cell carcinoma of skin of right upper limb, including shoulder: Secondary | ICD-10-CM | POA: Diagnosis not present

## 2017-10-14 DIAGNOSIS — C44612 Basal cell carcinoma of skin of right upper limb, including shoulder: Secondary | ICD-10-CM | POA: Diagnosis not present

## 2017-10-14 DIAGNOSIS — Z85828 Personal history of other malignant neoplasm of skin: Secondary | ICD-10-CM | POA: Diagnosis not present

## 2017-10-18 DIAGNOSIS — Z800 Family history of malignant neoplasm of digestive organs: Secondary | ICD-10-CM | POA: Diagnosis not present

## 2017-10-18 DIAGNOSIS — Z980 Intestinal bypass and anastomosis status: Secondary | ICD-10-CM | POA: Diagnosis not present

## 2017-10-18 DIAGNOSIS — C189 Malignant neoplasm of colon, unspecified: Secondary | ICD-10-CM | POA: Diagnosis not present

## 2017-10-18 DIAGNOSIS — D124 Benign neoplasm of descending colon: Secondary | ICD-10-CM | POA: Diagnosis not present

## 2017-10-18 DIAGNOSIS — D120 Benign neoplasm of cecum: Secondary | ICD-10-CM | POA: Diagnosis not present

## 2017-10-18 DIAGNOSIS — K5730 Diverticulosis of large intestine without perforation or abscess without bleeding: Secondary | ICD-10-CM | POA: Diagnosis not present

## 2017-10-18 DIAGNOSIS — I10 Essential (primary) hypertension: Secondary | ICD-10-CM | POA: Diagnosis not present

## 2017-10-18 DIAGNOSIS — Z86010 Personal history of colonic polyps: Secondary | ICD-10-CM | POA: Diagnosis not present

## 2017-10-18 DIAGNOSIS — K7469 Other cirrhosis of liver: Secondary | ICD-10-CM | POA: Diagnosis not present

## 2017-10-18 DIAGNOSIS — K5140 Inflammatory polyps of colon without complications: Secondary | ICD-10-CM | POA: Diagnosis not present

## 2017-10-18 DIAGNOSIS — Z1211 Encounter for screening for malignant neoplasm of colon: Secondary | ICD-10-CM | POA: Diagnosis not present

## 2017-10-18 DIAGNOSIS — K640 First degree hemorrhoids: Secondary | ICD-10-CM | POA: Diagnosis not present

## 2017-10-19 ENCOUNTER — Ambulatory Visit: Payer: Medicare Other | Admitting: Neurology

## 2017-10-20 ENCOUNTER — Encounter: Payer: Self-pay | Admitting: Neurology

## 2017-10-24 ENCOUNTER — Ambulatory Visit: Payer: Medicare Other | Admitting: Neurology

## 2017-10-24 ENCOUNTER — Encounter: Payer: Self-pay | Admitting: Neurology

## 2017-10-24 VITALS — BP 113/67 | HR 73 | Ht 74.0 in | Wt 185.0 lb

## 2017-10-24 DIAGNOSIS — R260 Ataxic gait: Secondary | ICD-10-CM | POA: Insufficient documentation

## 2017-10-24 DIAGNOSIS — G3281 Cerebellar ataxia in diseases classified elsewhere: Secondary | ICD-10-CM | POA: Insufficient documentation

## 2017-10-24 DIAGNOSIS — G252 Other specified forms of tremor: Secondary | ICD-10-CM | POA: Insufficient documentation

## 2017-10-24 DIAGNOSIS — K766 Portal hypertension: Secondary | ICD-10-CM

## 2017-10-24 DIAGNOSIS — R259 Unspecified abnormal involuntary movements: Secondary | ICD-10-CM | POA: Diagnosis not present

## 2017-10-24 DIAGNOSIS — G20 Parkinson's disease: Secondary | ICD-10-CM

## 2017-10-24 DIAGNOSIS — G629 Polyneuropathy, unspecified: Secondary | ICD-10-CM

## 2017-10-24 DIAGNOSIS — I8500 Esophageal varices without bleeding: Secondary | ICD-10-CM | POA: Diagnosis not present

## 2017-10-24 DIAGNOSIS — G20C Parkinsonism, unspecified: Secondary | ICD-10-CM

## 2017-10-24 HISTORY — DX: Parkinsonism, unspecified: G20.C

## 2017-10-24 HISTORY — DX: Cerebellar ataxia in diseases classified elsewhere: G32.81

## 2017-10-24 HISTORY — DX: Parkinson's disease: G20

## 2017-10-24 HISTORY — DX: Esophageal varices without bleeding: I85.00

## 2017-10-24 HISTORY — DX: Other specified forms of tremor: G25.2

## 2017-10-24 MED ORDER — CARBIDOPA-LEVODOPA 25-100 MG PO TABS: 1.0000 | ORAL_TABLET | Freq: Three times a day (TID) | ORAL | 0 refills | Status: DC

## 2017-10-24 NOTE — Progress Notes (Signed)
SLEEP MEDICINE CLINIC   Provider:  Larey Seat, M D  Primary Care Physician:  Burnard Bunting, MD   Referring Provider: Burnard Bunting, MD    Chief Complaint  Patient presents with  . New Patient (Initial Visit)    pt with wife, rm 20, he has neuropathy. he started noticing tremors in the right arm. he is having some ADL's that he has lost ability to or weaker to do. He states the Right leg he has difficulty with his walking, takes effort using the right leg. pt states that lost some of his fine motor skills. he states these symptoms started months ago.     HPI:  [name] is a 77 y.o. male patient who was seen here previously in 2010 - as a patient of Russell Cobble, MD for a sleep apnea evaluation. The patient had mild apnea- and was already on CPAP. I referred to Neuropsychology PhD , and from there to Psychiatry - Dr. Casimiro [name].  The patient continued to use his CPAP, but he also started on Adderall with Dr. Casimiro [name] and has been doing well for the last 6 or 7 years. Today's referral is not for sleep but for tremor evaluation.  The patient apparently carries already the diagnosis of benign essential tremor, but he has noted that his right hand and upper extremity tremor has worsened and the unilateral nature is of more concern.  He has a history of neuropathy, renal portal hypertension, anemia, reflux, thrombocytopenia, hypertension atrial fibrillation, colon cancer with liver damage through chemotherapy, a traumatic brain injury and obstructive sleep apnea.  He has undergone a colon resection in 2013-I had seen him before this diagnosis was made- and a fractured leg in 2015 , he had varicella-zoster 2018 in 2019. He had a upper gi bleed, esophageal veinous bleed with banding in 2018.   And Dr. [name] referred now for tremor evaluation.   Chief complaint according to patient : "My right hand is trembling ".   I have the pleasure of meeting [name] today who reports that  his right arm and hand has been trembling.  This is clearly visible at rest he has a pill-rolling tremor and there is actually some tremor in both upper extremities.  His deep tendon reflexes appear symmetric, there is rigor  over both shoulders and a little bit of crepitation over the left.  He lost muscle mass. He does have a decreased facial mimic, he has titubation.  No abnormal eye movements, there is some dysphonia or hoarseness of the voice noted.  Extensive medical history above.   Social history: married, no smoker, non drinker. Daughter is 43- behavior analyst , son is 64. Retired from [employer] and [employer].  Caffeine : iced tea, diet sodas. 3-4 a day.    Review of Systems: Out of a complete 14 system review, the patient complains of only the following symptoms, and all other reviewed systems are negative.  Tremor at rest , not with maneuvers.   Social History   Socioeconomic History  . Marital status: Married    Spouse name: Not on file  . Number of children: 2  . Years of education: Not on file  . Highest education level: Not on file  Occupational History  . Occupation: Retired  Scientific laboratory technician  . Financial resource strain: Not on file  . Food insecurity:    Worry: Not on file    Inability: Not on file  . Transportation needs:  Medical: Not on file    Non-medical: Not on file  Tobacco Use  . Smoking status: Former Smoker    Packs/day: 0.50    Years: 7.00    Pack years: 3.50    Types: Cigarettes    Last attempt to quit: 03/21/1961    Years since quitting: 56.6  . Smokeless tobacco: Never Used  Substance and Sexual Activity  . Alcohol use: No    Alcohol/week: 0.0 standard drinks  . Drug use: No  . Sexual activity: Yes  Lifestyle  . Physical activity:    Days per week: Not on file    Minutes per session: Not on file  . Stress: Not on file  Relationships  . Social connections:    Talks on phone: Not on file    Gets together: Not on file    Attends  religious service: Not on file    Active member of club or organization: Not on file    Attends meetings of clubs or organizations: Not on file    Relationship status: Not on file  . Intimate partner violence:    Fear of current or ex partner: Not on file    Emotionally abused: Not on file    Physically abused: Not on file    Forced sexual activity: Not on file  Other Topics Concern  . Not on file  Social History Narrative  . Not on file    Family History  Problem Relation Age of Onset  . Diabetes Father   . Prostate cancer Father   . Heart attack Father 52  . Heart failure Mother        Congestive heart failure  . Cancer Brother        colon  . Colon cancer Brother 7  . Stomach cancer Sister   . Colon cancer Other   . Colon cancer Sister   . Coronary artery disease Brother        CABG  . Lung cancer Brother   . Anesthesia problems Neg Hx   . Esophageal cancer Neg Hx     Past Medical History:  Diagnosis Date  . Anemia   . Arthritis    "thumbs" (11/29/2013)  . Atrial fibrillation [facility]) 2006   Dr Tonny Bollman af-no meds  . colon ca dx'd 02/2011   COLON CANCER  . Diverticulosis    Sigmoid colon  . Esophageal stricture   . 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 Fracture of distal femur ([place]) 11/29/2013  . GERD (gastroesophageal reflux disease) 03-31-11   tx. Omeprazole  . Hearing loss    has bilateral hearing aids  . Hernia, abdominal    at the umbilicus  . History of hemorrhoids   . History of irregular heartbeat   . Hypercholesterolemia   . Hyperlipidemia   . Neuropathy 2013   as a result of chemo  . OSA on CPAP dx'd 2010   uses cap nightly  . Oxygen deficiency   . Personal history of colonic polyps 09/17/2009   Tubular adenoma  . Pneumonia 01/2013  . 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 Sleep apnea    uses c pap at home  . TBI (traumatic brain injury)  ([place]) 1986   S/P fall; "slow to get going q morning" (10//22/2015)    Past Surgical History:  Procedure Laterality Date  . APPENDECTOMY  03/2011  . CARDIAC CATHETERIZATION  07/2002  Dr Wynonia Lawman  . COLON SURGERY  2013  . COLONOSCOPY  sept 2016   upper endo and colonoscopy  . ESOPHAGEAL DILATION  2006  . ESOPHAGOGASTRODUODENOSCOPY (EGD) WITH PROPOFOL N/A 02/25/2016   Procedure: ESOPHAGOGASTRODUODENOSCOPY (EGD) WITH PROPOFOL;  Surgeon: Manus Gunning, MD;  Location: [facility];  Service: Gastroenterology;  Laterality: N/A;  . ESOPHAGOGASTRODUODENOSCOPY (EGD) WITH PROPOFOL N/A 03/10/2016   Procedure: ESOPHAGOGASTRODUODENOSCOPY (EGD) WITH PROPOFOL;  Surgeon: Manus Gunning, MD;  Location: [facility];  Service: Gastroenterology;  Laterality: N/A;  . GASTRIC VARICES BANDING N/A 03/10/2016   Procedure: GASTRIC VARICES BANDING;  Surgeon: Manus Gunning, MD;  Location: [facility];  Service: Gastroenterology;  Laterality: N/A;  . INGUINAL HERNIA REPAIR Left ~ 1991  . LAPAROSCOPIC PARTIAL COLECTOMY  04/08/2011  . ORIF DISTAL FEMUR FRACTURE Right 11/29/2013   "put plate in"  . ORIF FEMUR FRACTURE Right 11/29/2013   Procedure: OPEN REDUCTION INTERNAL FIXATION (ORIF) RIGHT  DISTAL FEMUR FRACTURE;  Surgeon: Rozanna Box, MD;  Location: [place];  Service: Orthopedics;  Laterality: Right;  . PORT-A-CATH REMOVAL  11/09/2011   Procedure: REMOVAL PORT-A-CATH;  Surgeon: Odis Hollingshead, MD;  Location: [facility];  Service: General;  Laterality: N/A;  . PORTACATH PLACEMENT  04/30/2011   Procedure: INSERTION PORT-A-CATH;  Surgeon: Odis Hollingshead, MD;  Location: [place];  Service: General;  Laterality: Right;    Current Outpatient Medications  Medication Sig Dispense Refill  . amphetamine-dextroamphetamine (ADDERALL) 30 MG tablet Take 10-20 mg by mouth daily with breakfast.     . ferrous sulfate 325 (65 FE) MG tablet Take 325 mg by mouth 2 (two) times daily.    .  pantoprazole (PROTONIX) 40 MG tablet Take 1 tablet (40 mg total) by mouth 2 (two) times daily. 60 tablet 0  . propranolol (INDERAL) 20 MG tablet Take 1 tablet (20 mg total) by mouth 2 (two) times daily. 60 tablet 0  . furosemide (LASIX) 20 MG tablet Take 1 tablet (20 mg total) by mouth daily. (Patient not taking: Reported on 02/15/2017) 90 tablet 0  . spironolactone (ALDACTONE) 25 MG tablet Take 1 tablet (25 mg total) by mouth daily. (Patient not taking: Reported on 02/15/2017) 90 tablet 0   No current facility-administered medications for this visit.     Allergies as of 10/24/2017  . (No Known Allergies)    Vitals: BP 113/67   Pulse 73   Ht 6\' 2"  (1.88 m)   Wt 185 lb (83.9 kg)   BMI 23.75 kg/m  Last Weight:  Wt Readings from Last 1 Encounters:  10/24/17 185 lb (83.9 kg)   XHB:ZJIR mass index is 23.75 kg/m.     Last Height:   Ht Readings from Last 1 Encounters:  10/24/17 6\' 2"  (1.88 m)    Physical exam:  General: The patient is awake, alert and appears not in acute distress. The patient has a stooped position.  Head: Normocephalic, atraumatic. Neck is supple. Mallampati 2 . His movements are slowed.  neck circumference:15. Nasal airflow patent , titubation is seen.  Cardiovascular:  Regular rate and rhythm, without  murmurs or carotid bruit, and without distended neck veins. Respiratory: Lungs are clear to auscultation. Skin:  Without evidence of edema, or rash Trunk: BMI is 23.7 .  Neurologic exam : The patient is awake and alert, oriented to place and time.   Memory subjective described as intact.  Attention span & concentration ability appears normal.  Speech is fluent,  without  dysarthria, dysphonia  or aphasia.  Mood and affect are appropriate.  Cranial nerves: Pupils are equal and briskly reactive to light.  Funduscopic exam without evidence of pallor or edema. Rare blinking. Extraocular movements in vertical and horizontal planes intact and without nystagmus. Visual  fields by finger perimetry are intact. Hearing to finger rub intact. Facial sensation intact to fine touch. Facial expression is masked.  Motor strength is symmetric-  tongue and uvula move midline.  Shoulder shrug was symmetrical.   Motor exam:  increased tone without cog wheeling , reduced  muscle bulk  ( very slim ) and symmetric strength in all extremities. Sensory:  Fine touch, pinprick and vibration were tested in all extremities.  There is no preserved sensation to vibration in either foot or ankle.  At the knee there is a reduced ability to feel vibration, the sensation is preserved in fingers hands and upper extremity.  Proprioception tested in the upper extremities was normal. Coordination: Rapid alternating movements in the fingers/hands was slowed - Finger-to-nose maneuver  normal without evidence of ataxia, dysmetria or tremor !  Gait and station: Patient walks without assistive device , his right leg appears stiff, the foot flat- .Tandem gait is unfragmented. Turns with 3 Steps. He does not stride, walks stooped but not shuffling. Los of arm swing and noted severe pill rolling tremor on the e right.  Romberg testing:  unsteady stance with closed eyes.  Deep tendon reflexes: in the  upper and lower extremities are symmetric and intact. Babinski maneuver response is equivocal  downgoing.    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  He is stooped.  In order to rule out hepatic contributions, I reviewed his labs- he has no ammonia or bilirubin elevation.   2) No  cog-wheeling- not flapping.   3) He broke his right leg and stated he hasn't walked well again- this may account for the "flat footed " gait.   4) atrial fibrillation, may have vascular component.   The patient was advised of the nature of the diagnosed disorder , the treatment options and the  risks for general health and wellness arising from not treating the condition.   I spent more than 60 minutes of face to face time with the patient.  Greater than 50% of time was spent in counseling and coordination of care. We have discussed the diagnosis and differential and I answered the patient's questions.    Plan:  Treatment plan and additional workup :  Brain MRI, cervical spine MRI.   DAT scan.  Neuropathy panel.   RV for trial of sinemet 25/100 MG before meal .  Larey Seat, MD 8/89/1694, 5:03 PM  Certified in Neurology by ABPN Certified in [place] by [facility] [address], [place], [place] [address]

## 2017-10-24 NOTE — Patient Instructions (Signed)
Carbidopa; Levodopa sustained-release tablets What is this medicine? CARBIDOPA; LEVODOPA (kar bi DOE pa; lee voe DOE pa) is used to treat the symptoms of Parkinson's disease. This medicine may be used for other purposes; ask your health care provider or pharmacist if you have questions. COMMON BRAND NAME(S): SINEMET, SINEMET CR What should I tell my health care provider before I take this medicine? They need to know if you have any of these conditions: -depression or other mental illness -diabetes -glaucoma -heart disease, including history of a heart attack -irregular heart beat -kidney disease -liver disease -lung or breathing disease, like asthma -melanoma or suspicious skin lesions -stomach or intestine ulcers -an unusual or allergic reaction to levodopa, carbidopa, other medicines, foods, dyes, or preservatives -pregnant or trying to get pregnant -breast-feeding How should I use this medicine? Take this medicine by mouth with a glass of water. Follow the directions on the prescription label. Swallow whole. Do not crush or chew. You may cut the tablets in half. Take your doses at regular intervals. Do not take your medicine more often than directed. Do not stop taking except on the advice of your doctor or health care professional. Talk to your pediatrician regarding the use of this medicine in children. Special care may be needed. Overdosage: If you think you have taken too much of this medicine contact a poison control center or emergency room at once. NOTE: This medicine is only for you. Do not share this medicine with others. What if I miss a dose? If you miss a dose, take it as soon as you can. If it is almost time for your next dose, take only that dose. Do not take double or extra doses. What may interact with this medicine? Do not take this medicine with any of the following medications: -MAOIs like Marplan, Nardil, and Parnate -reserpine -tetrabenazine This medicine may  also interact with the following medications: -alcohol -droperidol -entacapone -iron supplements or multivitamins with iron -isoniazid, INH -linezolid -medicines for depression, anxiety, or psychotic disturbances -medicines for high blood pressure -medicines for sleep -metoclopramide -papaverine -procarbazine -tedizolid -rasagiline -selegiline -tolcapone This list may not describe all possible interactions. Give your health care provider a list of all the medicines, herbs, non-prescription drugs, or dietary supplements you use. Also tell them if you smoke, drink alcohol, or use illegal drugs. Some items may interact with your medicine. What should I watch for while using this medicine? Visit your doctor or health care professional for regular checks on your progress. It may be several weeks or months before you feel the full benefits of this medicine. Continue to take your medicine on a regular schedule. Do not take any additional medicines for Parkinson's disease without first consulting with your health care provider. You may get drowsy or dizzy. Do not drive, use machinery, or do anything that needs mental alertness until you know how this drug affects you. Do not stand or sit up quickly, especially if you are an older patient. This reduces the risk of dizzy or fainting spells. Alcohol can make you more drowsy and dizzy. Avoid alcoholic drinks. If you find that you have sudden feelings of wanting to sleep during normal activities, like cooking, watching television, or while driving or riding in a car, you should contact your health care professional. Dennis Bast may experience a "wearing off" effect prior to the time for your next dose of this medicine. You may also experience an "on-off" effect where the medicine apparently stops working for anything from a  minute to several hours, then suddenly starts working again. Tell your doctor or health care professional if any of these symptoms happen to  you. Your dose may need adjustment. A high protein diet can slow or prevent absorption of this medicine. Avoid high protein foods near the time of taking this medicine to help to prevent these problems. Take this medicine at least 30 minutes before eating or one hour after meals. You may want to eat higher protein foods later in the day or in small amounts. Discuss your diet with your doctor or health care professional or nutritionist. If you have diabetes, you may get a false-positive result for sugar in your urine. Check with your doctor or health care professional. This medicine may discolor the urine or sweat, making it look darker or red in color. This is of no cause for concern. However, this may stain clothing or fabrics. There have been reports of increased sexual urges or other strong urges such as gambling while taking some medicines for Parkinson's disease. If you experience any of these urges while taking this medicine, you should report it to your health care provider as soon as possible. You should check your skin often for changes to moles and new growths while taking this medicine. Call your doctor if you notice any of these changes. What side effects may I notice from receiving this medicine? Side effects that you should report to your doctor or health care professional as soon as possible: -allergic reactions like skin rash, itching or hives, swelling of the face, lips, or tongue -anxiety, confusion, or nervousness -falling asleep during normal activities like driving -fast, irregular heartbeat -hallucination, loss of contact with reality -mood changes like aggressive behavior, depression -stomach pain -trouble passing urine -uncontrolled movements of the mouth, head, hands, feet, shoulders, eyelids or other unusual muscle movements Side effects that usually do not require medical attention (report to your doctor or health care professional if they continue or are  bothersome): -headache -loss of appetite -muscle twitches -nausea/vomiting -nightmares, trouble sleeping -unusually weak ot tired This list may not describe all possible side effects. Call your doctor for medical advice about side effects. You may report side effects to FDA at 1-800-FDA-1088. Where should I keep my medicine? Keep out of the reach of children. Store below 30 degrees C (86 degrees F). Keep container tightly closed. Throw away any unused medicine after the expiration date. NOTE: This sheet is a summary. It may not cover all possible information. If you have questions about this medicine, talk to your doctor, pharmacist, or health care provider.  2018 Elsevier/Gold Standard (2013-03-27 15:40:38)

## 2017-10-25 ENCOUNTER — Telehealth: Payer: Self-pay | Admitting: Neurology

## 2017-10-25 LAB — COMPREHENSIVE METABOLIC PANEL
A/G RATIO: 1.6 (ref 1.2–2.2)
ALT: 23 IU/L (ref 0–44)
AST: 18 IU/L (ref 0–40)
Albumin: 4 g/dL (ref 3.5–4.8)
Alkaline Phosphatase: 109 IU/L (ref 39–117)
BILIRUBIN TOTAL: 1.1 mg/dL (ref 0.0–1.2)
BUN/Creatinine Ratio: 16 (ref 10–24)
BUN: 16 mg/dL (ref 8–27)
CHLORIDE: 104 mmol/L (ref 96–106)
CO2: 22 mmol/L (ref 20–29)
Calcium: 9.6 mg/dL (ref 8.6–10.2)
Creatinine, Ser: 1.02 mg/dL (ref 0.76–1.27)
GFR calc non Af Amer: 71 mL/min/{1.73_m2} (ref >59)
GFR, EST AFRICAN AMERICAN: 82 mL/min/{1.73_m2} (ref >59)
Globulin, Total: 2.5 g/dL (ref 1.5–4.5)
Glucose: 137 mg/dL — ABNORMAL HIGH (ref 65–99)
POTASSIUM: 4.2 mmol/L (ref 3.5–5.2)
Sodium: 140 mmol/L (ref 134–144)
TOTAL PROTEIN: 6.5 g/dL (ref 6.0–8.5)

## 2017-10-25 NOTE — Telephone Encounter (Signed)
[employer] pending faxed clinical notes  °

## 2017-10-26 ENCOUNTER — Telehealth: Payer: Self-pay | Admitting: Neurology

## 2017-10-26 NOTE — Telephone Encounter (Signed)
-----   Message from Larey Seat, MD sent at 10/25/2017  5:47 PM EDT ----- Normal metabolic panel , non fasting. Allowed to precede with contrast MRI.

## 2017-10-26 NOTE — Telephone Encounter (Signed)
Called the patient to advise of the normal lab results. No answer. LVM informing the patient of the normal results and informed this would allow for the pt to proceed forward with completing the MRI with contrast. Instructed the pt to call with any questions he may have otherwise will call once I have the results of the MRI.

## 2017-10-27 NOTE — Telephone Encounter (Signed)
[employer] Josem Kaufmann: [id number] (exp. 10/25/17 to 11/23/17) order sent to [facility]. They will reach out to the pt to schedule.

## 2017-11-02 DIAGNOSIS — Z125 Encounter for screening for malignant neoplasm of prostate: Secondary | ICD-10-CM | POA: Diagnosis not present

## 2017-11-02 DIAGNOSIS — R82998 Other abnormal findings in urine: Secondary | ICD-10-CM | POA: Diagnosis not present

## 2017-11-02 DIAGNOSIS — I10 Essential (primary) hypertension: Secondary | ICD-10-CM | POA: Diagnosis not present

## 2017-11-02 DIAGNOSIS — R7301 Impaired fasting glucose: Secondary | ICD-10-CM | POA: Diagnosis not present

## 2017-11-02 DIAGNOSIS — E7849 Other hyperlipidemia: Secondary | ICD-10-CM | POA: Diagnosis not present

## 2017-11-05 ENCOUNTER — Ambulatory Visit
Admission: RE | Admit: 2017-11-05 | Discharge: 2017-11-05 | Disposition: A | Discharge status: Home / Self Care | Payer: Medicare Other | Source: Ambulatory Visit | Attending: Neurology | Admitting: Neurology

## 2017-11-05 DIAGNOSIS — G20 Parkinson's disease: Secondary | ICD-10-CM | POA: Diagnosis not present

## 2017-11-05 DIAGNOSIS — I8500 Esophageal varices without bleeding: Secondary | ICD-10-CM

## 2017-11-05 DIAGNOSIS — K766 Portal hypertension: Secondary | ICD-10-CM

## 2017-11-05 DIAGNOSIS — R259 Unspecified abnormal involuntary movements: Secondary | ICD-10-CM | POA: Diagnosis not present

## 2017-11-05 DIAGNOSIS — R260 Ataxic gait: Secondary | ICD-10-CM

## 2017-11-05 DIAGNOSIS — G252 Other specified forms of tremor: Secondary | ICD-10-CM

## 2017-11-05 DIAGNOSIS — G629 Polyneuropathy, unspecified: Secondary | ICD-10-CM

## 2017-11-05 MED ORDER — GADOBENATE DIMEGLUMINE 529 MG/ML IV SOLN
17.0000 mL | Freq: Once | INTRAVENOUS | Status: AC | PRN
  Administered 2017-11-05: 17 mL via INTRAVENOUS

## 2017-11-08 ENCOUNTER — Telehealth: Payer: Self-pay

## 2017-11-08 NOTE — Telephone Encounter (Signed)
Voice mail only- left message to assure him no acute abnormalities seen and the discussed lesion is the same  Seen in both scans - the scans overlap at the base of the brain.

## 2017-11-08 NOTE — Telephone Encounter (Signed)
PT stated he has not heard from therapy about getting an appt. Rn stated the referral was sent last week.RN gave pt number for neuro rehab. Pt verbalized understanding, and was happy with the call.

## 2017-11-08 NOTE — Telephone Encounter (Signed)
Rn spoke with [name] again about his two scans MRI brain,and MR cervical spine. [name] states he had concerns because Dr.Dohmeier mention it in both scans. [name] thinks he had two strokes. Rn stated a message will be sent to Dr.Dohmeier to call [name] to clarify her findings. [name] verbalized understanding. [name] is looking at the results and finding on mychart.

## 2017-11-08 NOTE — Telephone Encounter (Signed)
-----   Message from Lester Minooka, RN sent at 11/07/2017 11:19 AM EDT -----   ----- Message ----- From: Larey Seat, MD Sent: 11/07/2017   8:56 AM EDT To: Burnard Bunting, MD, Darleen Crocker, RN  After the infusion of contrast material, a normal enhancement pattern is noted.   IMPRESSION: This MRI of the brain with and without contrast shows the following: 1.    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   2.    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 3.    Mild to moderate generalized cortical atrophy that has progressed since the 2006 CT scan. 4.    There are no acute findings.    INTERPRETING PHYSICIAN: 11-05-2017 Richard A. Felecia Shelling, MD, PhD, FAAN Certified in  [place] by [place] [facility] [employer] [facility]

## 2017-11-08 NOTE — Telephone Encounter (Signed)
Patient has questions about his MRI results.

## 2017-11-08 NOTE — Telephone Encounter (Signed)
RN call patient about MRI brain. There is a remote infarction in superior left cerebellum, unchanged since 2006 Ct scan, small cyst seen that is slightly enlarged. PT was aware he had a cyst from years ago. Mild to moderate cortical atrophy progress since 2016 scan. No acute findings. Pt verbalized understanding.

## 2017-11-08 NOTE — Telephone Encounter (Signed)
Rn call patient about MRI cervical spine results. Rn stated spine appears normal. NO surgical intervention at this time. 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 The remote infarction is in the left cerebral hemisphere. Could influence muscle tone, strength, and coordination. Pt verbalized understanding. ------

## 2017-11-19 ENCOUNTER — Other Ambulatory Visit: Payer: Self-pay | Admitting: Neurology

## 2017-11-30 ENCOUNTER — Ambulatory Visit: Payer: Medicare Other | Admitting: Physical Therapy

## 2017-12-06 ENCOUNTER — Ambulatory Visit: Payer: Medicare Other | Admitting: Physical Therapy

## 2017-12-07 DIAGNOSIS — E7849 Other hyperlipidemia: Secondary | ICD-10-CM | POA: Diagnosis not present

## 2017-12-07 DIAGNOSIS — Z0000 Encounter for general adult medical examination without abnormal findings: Secondary | ICD-10-CM | POA: Diagnosis not present

## 2017-12-07 DIAGNOSIS — I10 Essential (primary) hypertension: Secondary | ICD-10-CM | POA: Diagnosis not present

## 2017-12-07 DIAGNOSIS — R7301 Impaired fasting glucose: Secondary | ICD-10-CM | POA: Diagnosis not present

## 2017-12-13 ENCOUNTER — Encounter: Payer: Self-pay | Admitting: Neurology

## 2017-12-13 ENCOUNTER — Ambulatory Visit: Payer: Medicare Other | Admitting: Neurology

## 2017-12-13 VITALS — BP 122/74 | HR 83 | Ht 74.0 in | Wt 185.0 lb

## 2017-12-13 DIAGNOSIS — K766 Portal hypertension: Secondary | ICD-10-CM

## 2017-12-13 DIAGNOSIS — G20 Parkinson's disease: Secondary | ICD-10-CM | POA: Diagnosis not present

## 2017-12-13 DIAGNOSIS — R260 Ataxic gait: Secondary | ICD-10-CM

## 2017-12-13 DIAGNOSIS — I8500 Esophageal varices without bleeding: Secondary | ICD-10-CM

## 2017-12-13 DIAGNOSIS — R259 Unspecified abnormal involuntary movements: Secondary | ICD-10-CM | POA: Diagnosis not present

## 2017-12-13 DIAGNOSIS — Z1212 Encounter for screening for malignant neoplasm of rectum: Secondary | ICD-10-CM | POA: Diagnosis not present

## 2017-12-13 DIAGNOSIS — R490 Dysphonia: Secondary | ICD-10-CM

## 2017-12-13 DIAGNOSIS — G252 Other specified forms of tremor: Secondary | ICD-10-CM

## 2017-12-13 DIAGNOSIS — R1312 Dysphagia, oropharyngeal phase: Secondary | ICD-10-CM

## 2017-12-13 MED ORDER — CARBIDOPA-LEVODOPA 25-100 MG PO TABS
1.0000 | ORAL_TABLET | Freq: Four times a day (QID) | ORAL | 5 refills | Status: DC
Start: 2017-12-13 — End: 2018-04-06

## 2017-12-13 NOTE — Patient Instructions (Signed)
Parkinson Disease Parkinson disease is a long-term (chronic) condition. It gets worse over time (is progressive). Parkinson disease limits your ability:  To control how your body moves.  To move your body normally.  This condition affects each person differently. The condition can range from mild to very bad. This condition tends to progress slowly over many years. Follow these instructions at home:  Take over-the-counter and prescription medicines only as told by your doctor.  Put grab bars and railings in your home. These help to prevent falls.  Follow instructions from your doctor about what you can or cannot eat or drink.  Go back to your normal activities as told by your doctor. Ask your doctor what activities are safe for you.  Exercise as told by your doctor or physical therapist.  Keep all follow-up visits as told by your doctor. This is important. These include any visits with a speech or occupational therapist.  Think about joining a support group for people who have Parkinson disease. Contact a doctor if:  Medicines do not help your symptoms.  You feel off-balance.  You fall at home.  You need more help at home.  You have: ? Trouble swallowing. ? A very hard time pooping (constipation). ? A lot of side effects from your medicines.  You see or hear things that are not real (hallucinate).  You feel: ? Confused. ? Anxious. ? Sad (depressed). Get help right away if:  You were hurt in a fall.  You cannot swallow without choking.  You have chest pain.  You have trouble breathing.  You do not feel safe at home. This information is not intended to replace advice given to you by your health care provider. Make sure you discuss any questions you have with your health care provider. Document Released: 04/19/2011 Document Revised: 07/03/2015 Document Reviewed: 11/15/2014 Elsevier Interactive Patient Education  2018 Elsevier Inc.  

## 2017-12-13 NOTE — Progress Notes (Signed)
SLEEP MEDICINE CLINIC   Provider:  Larey Seat, M D  Primary Care Physician:  Burnard Bunting, MD Referring Provider: Burnard Bunting, MD    Chief Complaint  Patient presents with  . Follow-up    pt with wife, rm 82. pt here to discuss MRI follow up. tremor is still present in right forearm and hand. the wife also mentioned that for over a year he has had clear nasal drainage and wanted to know the cause behind that.     HPI:   Rv 12-13-2017 ,  [name] has been taking Sinemet 100/25 mg tid since last visit with me.  He is not sure if he has seen much improvement also I noticed that his right hand has a much milder resting tremor, and his wife agrees that his tremor amplitude and the fluidity of his movements have both had a positive response.  He continues to take Adderall as prescribed by psychiatry, he struggles with hearing loss, he is status post chemotherapy, he has been unable to take iron supplements because he did not want and to interfere with the carbidopa-levodopa therapy. He has a gait impairment since left femur fracture in 2015.  He has always felt that his neuropathic symptoms and his leg cramps worsened or actually set on a new after that fracture occurred.  He came home from the surgery to repair the fracture and felt as if he "slept on a bed of rocks." He has noticed some increase in leg cramps especially at night while on sinemet which I do not quite understand. He underwent MRI of the cervical spine and brain to evaluate further for Parkinson's versus spinal stenosis.    Chief complaint according to patient : "My right hand is trembling " Dr. [name] referred now for tremor evaluation.  I have the pleasure of meeting [name] 10-24-2017 , who reports that his right arm and hand has been trembling.  This is clearly visible at rest he has a pill-rolling tremor and there is actually some tremor in both upper extremities.  His deep tendon reflexes appear symmetric,  there is rigor  over both shoulders and a little bit of crepitation over the left.  He lost muscle mass. He does have a decreased facial mimic, he has titubation.  No abnormal eye movements, there is some dysphonia or hoarseness of the voice noted.  [name] is a 77 y.o. male patient who was seen here previously in 2010 - as a patient of [name], MD for a sleep apnea evaluation.  The patient had mild apnea- and was already on CPAP. I referred to Neuropsychology PhD , and from there on to Psychiatry - Dr. Casimiro Needle. The patient continued to use his CPAP, but he also started on Adderall with Dr. Casimiro Needle and has been doing well for the last 6 or 7 years. Today's referral is not for sleep but for tremor evaluation.  The patient apparently carries already the diagnosis of benign essential tremor, but he has noted that his right hand and upper extremity tremor has worsened and the unilateral nature is of more concern.  He has a history of neuropathy, renal portal hypertension, anemia, reflux, thrombocytopenia, hypertension atrial fibrillation, Colon cancer dx in 2/ 2013, with liver damage through chemotherapy, a traumatic brain injury and obstructive sleep apnea.  He has undergone a colon resection in 2013-I had seen him before this diagnosis was made- and a fractured leg in 2015 , he had varicella-zoster 2018 in 2019.  He had a upper gi bleed, esophageal veinous bleed with banding in 2018.  Social history: married, no smoker, non drinker. Daughter is 86- behavior analyst , son is 46. Retired from [employer] and [employer].  Caffeine : iced tea, diet sodas. 3-4 a day.    Review of Systems: Out of a complete 14 system review, the patient complains of only the following symptoms, and all other reviewed systems are negative.  Tremor at rest , not with maneuvers.   Imaging:STUDY DATE: 11/05/2017 PATIENT NAME: [name] DOB: Jul 21, 1940 MRN: [id number]  EXAM: MRI of the cervical spine  without contrast  ORDERING CLINICIAN: Asencion Partridge Dallis Czaja MD CLINICAL HISTORY: 77 year old man with right arm tremor and parkinsonism COMPARISON FILMS: None  TECHNIQUE: MRI of the cervical spine was obtained utilizing 3 mm sagittal slices from the posterior fossa down to the T3-4 level with T1, T2 and inversion recovery views. In addition 4 mm axial slices from N8-2 down to T1-2 level were included with T2 and gradient echo views. CONTRAST: None IMAGING SITE: [facility], [place], [place], [place]  FINDINGS: :  On sagittal images, the spine is imaged from above the cervicomedullary junction to T3.   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  A cyst is noted in the skull just right of midline.  The spinal cord is of normal caliber and signal.   The vertebral bodies are normally aligned.   The vertebral bodies have normal signal.    The discs and interspaces were further evaluated on axial views from C2 to T1 as follows:  C2-C3: The disc and interspace appear normal.  C3-C4: There is mild disc bulging and uncovertebral spurring causing minimal foraminal narrowing but no nerve root compression or spinal stenosis.  C4-C5: There is mild spinal stenosis due to disc bulging, uncovertebral spurring and left greater than right facet hypertrophy.  There is moderate left greater than right foraminal narrowing but no definite nerve root compression.  C5-C6: There is disc bulging, uncovertebral spurring and minimal left facet hypertrophy.  There is mild left and minimal right foraminal narrowing but no nerve root compression.  C6-C7: There is a small central disc bulge that does not lead to spinal stenosis or nerve root compression.  C7-T1: The disc and interspace appear normal.  T1-T2: The disc and interspace appear normal.  T2-T3: This level is only evaluated on the sagittal images but there appears to be a disc protrusion distorting the thecal sac  without causing spinal cord compression.  There is no nerve root compression.   IMPRESSION: This MRI of the cervical spine without contrast shows the following: 1.    The spinal cord appears normal. 2.    At C4-C5 there is mild spinal stenosis and moderate left greater than right foraminal narrowing.  There does not appear to be nerve root compression. 3.    At T2-T3, there is a disc protrusion that distorts the thecal sac without causing spinal cord compression.  There is no nerve root compression. 4.    There are milder degenerative changes at C3-C4, C5-C6 and C6-C7 thatt do not lead to spinal stenosis or nerve root compression.     INTERPRETING PHYSICIAN:  Richard A. Felecia Shelling, MD, PhD, FAAN Certified in  Neuroimaging by [facility] [employer] [facility] IMPRESSION: This MRI of the brain with and without contrast shows the following: 1.    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  2.    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 3.    Mild to moderate generalized cortical atrophy that has progressed since the 2006 CT scan. 4.    There are no acute findings.  I showed the patient these images, which do no explain his PD symptoms.   Social History   Socioeconomic History  . Marital status: Married    Spouse name: Not on file  . Number of children: 2  . Years of education: Not on file  . Highest education level: Not on file  Occupational History  . Occupation: Retired  Scientific laboratory technician  . Financial resource strain: Not on file  . Food insecurity:    Worry: Not on file    Inability: Not on file  . Transportation needs:    Medical: Not on file    Non-medical: Not on file  Tobacco Use  . Smoking status: Former Smoker    Packs/day: 0.50    Years: 7.00    Pack years: 3.50    Types: Cigarettes    Last attempt to quit: 03/21/1961    Years since quitting: 56.7  . Smokeless tobacco:  Never Used  Substance and Sexual Activity  . Alcohol use: No    Alcohol/week: 0.0 standard drinks  . Drug use: No  . Sexual activity: Yes  Lifestyle  . Physical activity:    Days per week: Not on file    Minutes per session: Not on file  . Stress: Not on file  Relationships  . Social connections:    Talks on phone: Not on file    Gets together: Not on file    Attends religious service: Not on file    Active member of club or organization: Not on file    Attends meetings of clubs or organizations: Not on file    Relationship status: Not on file  . Intimate partner violence:    Fear of current or ex partner: Not on file    Emotionally abused: Not on file    Physically abused: Not on file    Forced sexual activity: Not on file  Other Topics Concern  . Not on file  Social History Narrative  . Not on file    Family History  Problem Relation Age of Onset  . Diabetes Father   . Prostate cancer Father   . Heart attack Father 56  . Heart failure Mother        Congestive heart failure  . Cancer Brother        colon  . Colon cancer Brother 73  . Stomach cancer Sister   . Colon cancer Other   . Colon cancer Sister   . Coronary artery disease Brother        CABG  . Lung cancer Brother   . Anesthesia problems Neg Hx   . Esophageal cancer Neg Hx     Past Medical History:  Diagnosis Date  . Anemia   . Arthritis    "thumbs" (11/29/2013)  . Atrial fibrillation [facility]) 2006   Dr Tonny Bollman af-no meds  . colon ca dx'd 02/2011   COLON CANCER  . Diverticulosis    Sigmoid colon  . Esophageal stricture   . 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 Fracture of distal femur ([place]) 11/29/2013  . GERD (gastroesophageal reflux disease) 03-31-11   tx. Omeprazole  . Hearing loss    has bilateral hearing aids  .  Hernia, abdominal    at the umbilicus  . History of hemorrhoids   . History of irregular heartbeat   . Hypercholesterolemia   . Hyperlipidemia   .  Neuropathy 2013   as a result of chemo  . OSA on CPAP dx'd 2010   uses cap nightly  . Oxygen deficiency   . Personal history of colonic polyps 09/17/2009   Tubular adenoma  . Pneumonia 01/2013  . 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 Sleep apnea    uses c pap at home  . TBI (traumatic brain injury) ([place]) 1986   S/P fall; "slow to get going q morning" (10//22/2015)    Past Surgical History:  Procedure Laterality Date  . APPENDECTOMY  03/2011  . CARDIAC CATHETERIZATION  07/2002   Dr Wynonia Lawman  . COLON SURGERY  2013  . COLONOSCOPY  sept 2016   upper endo and colonoscopy  . ESOPHAGEAL DILATION  2006  . ESOPHAGOGASTRODUODENOSCOPY (EGD) WITH PROPOFOL N/A 02/25/2016   Procedure: ESOPHAGOGASTRODUODENOSCOPY (EGD) WITH PROPOFOL;  Surgeon: Manus Gunning, MD;  Location: [facility];  Service: Gastroenterology;  Laterality: N/A;  . ESOPHAGOGASTRODUODENOSCOPY (EGD) WITH PROPOFOL N/A 03/10/2016   Procedure: ESOPHAGOGASTRODUODENOSCOPY (EGD) WITH PROPOFOL;  Surgeon: Manus Gunning, MD;  [facility];  Service: Gastroenterology;  Laterality: N/A;  . GASTRIC VARICES BANDING N/A 03/10/2016   Procedure: GASTRIC VARICES BANDING;  Surgeon: Manus Gunning, MD;  [facility];  Service: Gastroenterology;  Laterality: N/A;  . INGUINAL HERNIA REPAIR Left ~ 1991  . LAPAROSCOPIC PARTIAL COLECTOMY  04/08/2011  . ORIF DISTAL FEMUR FRACTURE Right 11/29/2013   "put plate in"  . ORIF FEMUR FRACTURE Right 11/29/2013   Procedure: OPEN REDUCTION INTERNAL FIXATION (ORIF) RIGHT  DISTAL FEMUR FRACTURE;  Surgeon: Rozanna Box, MD;  Location: [facility];  Service: Orthopedics;  Laterality: Right;  . PORT-A-CATH REMOVAL  11/09/2011   Procedure: REMOVAL PORT-A-CATH;  Surgeon: Odis Hollingshead, MD;  Location: [facility];  Service: General;  Laterality: N/A;  . PORTACATH PLACEMENT  04/30/2011    Procedure: INSERTION PORT-A-CATH;  Surgeon: Odis Hollingshead, MD;  Location: [facility];  Service: General;  Laterality: Right;    Current Outpatient Medications  Medication Sig Dispense Refill  . amphetamine-dextroamphetamine (ADDERALL) 30 MG tablet Take 10-20 mg by mouth daily with breakfast.     . carbidopa-levodopa (SINEMET IR) 25-100 MG tablet TAKE 1 TABLET BY MOUTH THREE TIMES A DAY 90 tablet 1  . ferrous sulfate 325 (65 FE) MG tablet Take 325 mg by mouth 2 (two) times daily.    . furosemide (LASIX) 20 MG tablet Take 1 tablet (20 mg total) by mouth daily. (Patient taking differently: Take 20 mg by mouth daily as needed. ) 90 tablet 0  . pantoprazole (PROTONIX) 40 MG tablet Take 1 tablet (40 mg total) by mouth 2 (two) times daily. 60 tablet 0  . propranolol (INDERAL) 20 MG tablet Take 1 tablet (20 mg total) by mouth 2 (two) times daily. 60 tablet 0  . spironolactone (ALDACTONE) 25 MG tablet Take 1 tablet (25 mg total) by mouth daily. (Patient taking differently: Take 25 mg by mouth daily as needed. ) 90 tablet 0   No current facility-administered medications for this visit.     Allergies as of 12/13/2017  . (No Known Allergies)    Vitals: BP 122/74   Pulse 83   Ht 6\' 2"  (1.88 m)   Wt  185 lb (83.9 kg)   BMI 23.75 kg/m  Last Weight:  Wt Readings from Last 1 Encounters:  12/13/17 185 lb (83.9 kg)   JKK:XFGH mass index is 23.75 kg/m.     Last Height:   Ht Readings from Last 1 Encounters:  12/13/17 6\' 2"  (1.88 m)    Physical exam:  General: The patient is awake, alert and appears not in acute distress. The patient has a stooped position. He has hoarse voice with little cadence.  Head: Normocephalic, atraumatic.  Neck is supple. Mallampati 2. His movements are slowed.  neck circumference:15. Nasal airflow patent, titubation is seen.  Cardiovascular:  Regular rate and rhythm, without  murmurs or carotid bruit, and without distended neck veins.Respiratory: Lungs are clear to  auscultation. Skin:  Without evidence of edema, or rash. Trunk: BMI is 23.7 .  Neurologic exam : The patient is awake and alert, oriented to place and time.   Memory subjective described as intact.  Attention span & concentration ability appears normal.  Speech is fluent,  with dysphonia and now dysphagia.  Mood and affect are appropriate.  Cranial nerves: Pupils are equal and briskly reactive to light. Funduscopic exam without evidence of pallor or edema. Rare blinking. Extraocular movements in vertical and horizontal planes intact and without nystagmus. Visual fields by finger perimetry are intact. Hearing to finger rub impaired - forgot hearing aids today . Facial sensation intact to fine touch. Facial expression is masked.  Motor strength is symmetric-  tongue and uvula move midline.  Shoulder shrug was symmetrical.   Motor exam:  increased tone without cog wheeling, reduced  muscle bulk  ( very slim ) and symmetric strength in all extremities. Sensory:  Fine touch, pinprick and vibration were tested in all extremities.  There is no preserved sensation to vibration in either foot or ankle.  At the knee there is a reduced ability to feel vibration, the sensation is preserved in fingers hands and upper extremity.  Proprioception tested in the upper extremities was normal. Coordination: Rapid alternating movements in the fingers/hands was slowed - Finger-to-nose maneuver  normal without evidence of ataxia, dysmetria or tremor !  Gait and station: Patient walks without assistive device , his right leg appears stiff and shorter , the foot drops  .Tandem gait is unfragmented. He Turns with 3 Steps. He does not stride, walks stooped but is not shuffling. Loss of  Right arm swing and noted severe pill rolling tremor on the right hand .  Romberg testing: unsteady stance with closed eyes.    Assessment:  After physical and neurologic examination, review of laboratory studies,  Personal review of  imaging studies, reports of other /same  Imaging studies, results of polysomnography and / or neurophysiology testing and pre-existing records as far as provided in visit., my assessment is   1) With the asymmetric finding of tremor and dominant hand and arm , arm swing loss, masked face, dysphonia and rare blinking.  He is stooped. Parkinson's Disease.  2) He broke his right leg and stated he hasn't walked well again- this may account for the "flat footed " gait.  3) stooped posture - that's why we ordered MRI cervical spine, too.  4) atrial fibrillation, may have vascular component to PD.  The patient was advised of the nature of the diagnosed disorder , the treatment options and the  risks for general health and wellness arising from not treating the condition.   I spent more than 30 minutes of face  to face time with the patient.  Greater than 50% of time was spent in counseling and coordination of care. We have discussed the diagnosis and differential and I answered the patient's questions.    Plan:  Treatment plan and additional workup :  Neuro-rehab  Has not started yet- he may need additional ST, OT and PT .   Sinemet 25/100 Mg increased to qid.   DAT scan discussed, not needed at this time.  Rv in 6 month with NP.    Larey Seat, MD 41/03/9045, 5:33 PM  Certified in Neurology by ABPN Certified in [place] by [facility] [address] [place], [place] [address]

## 2017-12-15 ENCOUNTER — Other Ambulatory Visit: Payer: Self-pay | Admitting: Neurology

## 2017-12-23 ENCOUNTER — Ambulatory Visit: Payer: Medicare Other | Admitting: Physical Therapy

## 2018-01-16 ENCOUNTER — Other Ambulatory Visit: Payer: Self-pay | Admitting: Neurology

## 2018-01-16 ENCOUNTER — Telehealth: Payer: Self-pay | Admitting: Neurology

## 2018-01-16 DIAGNOSIS — I8500 Esophageal varices without bleeding: Secondary | ICD-10-CM

## 2018-01-16 DIAGNOSIS — K766 Portal hypertension: Secondary | ICD-10-CM

## 2018-01-16 DIAGNOSIS — R490 Dysphonia: Secondary | ICD-10-CM

## 2018-01-16 DIAGNOSIS — R1310 Dysphagia, unspecified: Secondary | ICD-10-CM

## 2018-01-16 DIAGNOSIS — G20 Parkinson's disease: Secondary | ICD-10-CM

## 2018-01-16 NOTE — Telephone Encounter (Signed)
Please place order for MBS Order number [id number] . Thanks Hinton Dyer. Patient needs to have before he starts OT and PT .

## 2018-01-16 NOTE — Telephone Encounter (Signed)
Order placed for the patient.

## 2018-02-07 ENCOUNTER — Other Ambulatory Visit (HOSPITAL_COMMUNITY): Payer: Self-pay

## 2018-02-07 DIAGNOSIS — R1310 Dysphagia, unspecified: Secondary | ICD-10-CM

## 2018-02-21 ENCOUNTER — Telehealth: Payer: Self-pay | Admitting: Oncology

## 2018-02-21 ENCOUNTER — Inpatient Hospital Stay: Payer: Medicare Other

## 2018-02-21 ENCOUNTER — Inpatient Hospital Stay: Payer: Medicare Other | Attending: Oncology | Admitting: Oncology

## 2018-02-21 DIAGNOSIS — Z800 Family history of malignant neoplasm of digestive organs: Secondary | ICD-10-CM

## 2018-02-21 DIAGNOSIS — D649 Anemia, unspecified: Secondary | ICD-10-CM

## 2018-02-21 DIAGNOSIS — G20 Parkinson's disease: Secondary | ICD-10-CM | POA: Diagnosis not present

## 2018-02-21 DIAGNOSIS — I4891 Unspecified atrial fibrillation: Secondary | ICD-10-CM

## 2018-02-21 DIAGNOSIS — K439 Ventral hernia without obstruction or gangrene: Secondary | ICD-10-CM | POA: Insufficient documentation

## 2018-02-21 DIAGNOSIS — G620 Drug-induced polyneuropathy: Secondary | ICD-10-CM | POA: Insufficient documentation

## 2018-02-21 DIAGNOSIS — K222 Esophageal obstruction: Secondary | ICD-10-CM

## 2018-02-21 DIAGNOSIS — K766 Portal hypertension: Secondary | ICD-10-CM | POA: Insufficient documentation

## 2018-02-21 DIAGNOSIS — K5730 Diverticulosis of large intestine without perforation or abscess without bleeding: Secondary | ICD-10-CM

## 2018-02-21 DIAGNOSIS — D6959 Other secondary thrombocytopenia: Secondary | ICD-10-CM | POA: Insufficient documentation

## 2018-02-21 DIAGNOSIS — R161 Splenomegaly, not elsewhere classified: Secondary | ICD-10-CM | POA: Insufficient documentation

## 2018-02-21 DIAGNOSIS — Z85038 Personal history of other malignant neoplasm of large intestine: Secondary | ICD-10-CM

## 2018-02-21 DIAGNOSIS — Z86010 Personal history of colonic polyps: Secondary | ICD-10-CM | POA: Diagnosis not present

## 2018-02-21 DIAGNOSIS — G4730 Sleep apnea, unspecified: Secondary | ICD-10-CM

## 2018-02-21 LAB — CBC WITH DIFFERENTIAL (CANCER CENTER ONLY)
Abs Immature Granulocytes: 0.01 10*3/uL (ref 0.00–0.07)
Basophils Absolute: 0 10*3/uL (ref 0.0–0.1)
Basophils Relative: 1 %
Eosinophils Absolute: 0.1 10*3/uL (ref 0.0–0.5)
Eosinophils Relative: 3 %
HCT: 41 % (ref 39.0–52.0)
Hemoglobin: 14.3 g/dL (ref 13.0–17.0)
Immature Granulocytes: 0 %
Lymphocytes Relative: 22 %
Lymphs Abs: 1 10*3/uL (ref 0.7–4.0)
MCH: 29.9 pg (ref 26.0–34.0)
MCHC: 34.9 g/dL (ref 30.0–36.0)
MCV: 85.6 fL (ref 80.0–100.0)
MONO ABS: 0.4 10*3/uL (ref 0.1–1.0)
Monocytes Relative: 8 %
Neutro Abs: 3.1 10*3/uL (ref 1.7–7.7)
Neutrophils Relative %: 66 %
PLATELETS: 105 10*3/uL — AB (ref 150–400)
RBC: 4.79 MIL/uL (ref 4.22–5.81)
RDW: 13.9 % (ref 11.5–15.5)
WBC Count: 4.6 10*3/uL (ref 4.0–10.5)
nRBC: 0 % (ref 0.0–0.2)

## 2018-02-21 LAB — CEA (IN HOUSE-CHCC): CEA (CHCC-In House): 1.65 ng/mL (ref 0.00–5.00)

## 2018-02-21 NOTE — Progress Notes (Signed)
[facility] OFFICE PROGRESS NOTE   Diagnosis: Colon cancer  INTERVAL HISTORY:   [name] returns as scheduled.  He feels well.  He has been diagnosed with Parkinson's disease and is now taking Sinemet.  No bleeding.  He is followed closely by gastroenterology in [place] for screening colonoscopy and upper endoscopy.  He has undergone repeat banding of esophageal varices.  He reports last undergoing a colonoscopy in September 2019.  Objective:  Vital signs in last 24 hours:  There were no vitals taken for this visit.    HEENT: Neck without mass Lymphatics: No cervical, supraclavicular, axillary, or inguinal nodes Resp: Lungs clear bilaterally Cardio: Irregular GI: No hepatosplenomegaly, no mass, nontender Vascular: No leg edema Neuro: Resting tremor of the right hand      Lab Results:  Lab Results  Component Value Date   WBC 4.6 02/21/2018   HGB 14.3 02/21/2018   HCT 41.0 02/21/2018   MCV 85.6 02/21/2018   PLT 105 (L) 02/21/2018   NEUTROABS 3.1 02/21/2018    CMP  Lab Results  Component Value Date   NA 140 10/24/2017   K 4.2 10/24/2017   CL 104 10/24/2017   CO2 22 10/24/2017   GLUCOSE 137 (H) 10/24/2017   BUN 16 10/24/2017   CREATININE 1.02 10/24/2017   CALCIUM 9.6 10/24/2017   PROT 6.5 10/24/2017   ALBUMIN 4.0 10/24/2017   AST 18 10/24/2017   ALT 23 10/24/2017   ALKPHOS 109 10/24/2017   BILITOT 1.1 10/24/2017   GFRNONAA 71 10/24/2017   GFRAA 82 10/24/2017    Lab Results  Component Value Date   CEA1 1.65 02/21/2018   Medications: I have reviewed the patient's current medications.   Assessment/Plan: 1. Moderately differentiated adenocarcinoma of the cecum, stage IIIB (T4 N1), status post a right colectomy on 04/08/2011 -the tumor was microsatellite stable and K-ras wild-type . He began adjuvant CAPOX chemotherapy on 05/03/2011. He completed cycle 5 on 07/26/2011 (oxaliplatin was held from cycle 4). He completed cycle 7 beginning  09/06/2011. He completed cycle 8 beginning 09/27/2011.   CTs of the chest, abdomen, and pelvis 05/15/2013-negative for recurrent colon cancer  CTs of the chest, abdomen, and pelvis 06/12/2014-negative for recurrent colon cancer 2. History of colon polyps. He underwent a surveillance colonoscopy 11/07/2015 3. History of anemia secondary to iron deficiency and chemotherapy. 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 Postoperative abdominal wall/flank hematoma.  5. Sleep apnea.  6. History of atrial fibrillation.  7. History of esophageal stricture.  8. Family history of colon cancer and gastric cancer. The tumor was microsatellite stable and no mutation was found in the mismatch repair genes. A CancerNext panel was negative.  9. 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 Neutropenia/thrombocytopenia secondary to chemotherapy-he has chronic mild thrombocytopenia-likely secondary to Portal hypertension 12. Abdominal wall hernia noted on a CT of the abdomen 12/03/2011.  13. Splenomegaly-noted on CT scans back to January of 2013.  14. Stool positive for occult blood 02/12/2013. Ferritin 38 02/14/2013. Colonoscopy and upper endoscopy 03/07/2013 with no source for GI blood loss identified. Capsule endoscopy 03/20/2013 negative. Upper endoscopy September 2016 confirmed esophagus varices and portal hypertensive gastropathy. 15. Colonoscopy on 03/07/2013. At the anastomosis along the suture line there were several areas of prominent mucosa. Biopsies were taken. Moderate diverticulosis was noted in the sigmoid colon. Internal hemorrhoids also noted. No definite source for GI blood loss was identified. Biopsy of  the anastomosis site showed benign colonic mucosa with inflamed granulation tissue. No adenomatous change or malignancy. He subsequently underwent an upper endoscopy (03/07/2013)  which was normal.  16. Right distal femur fracture October 2015 status post surgery. 74. 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  Admission with acute GI bleeding secondary to esophagus varices January 2018-now followed by gastroenterology in [place] with repeat esophageal banding 19.  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  Parkinson's disease     Disposition: [name] remains in clinical remission from colon cancer.  He would like to continue follow-up at the Cancer center.  He will return for an office and lab visit in 1 year.  He is followed closely by gastroenterology in [place] for management of portal hypertension and surveillance upper and lower endoscopies.  15 minutes were spent with the patient today.  The majority of the time was used for counseling and coordination of care.  Betsy Coder, MD  02/21/2018  12:35 PM

## 2018-02-21 NOTE — Telephone Encounter (Signed)
Printed avs. °

## 2018-02-23 ENCOUNTER — Encounter (HOSPITAL_COMMUNITY): Payer: Medicare Other

## 2018-02-23 ENCOUNTER — Ambulatory Visit (HOSPITAL_COMMUNITY): Payer: Medicare Other

## 2018-02-24 DIAGNOSIS — H524 Presbyopia: Secondary | ICD-10-CM | POA: Diagnosis not present

## 2018-03-01 ENCOUNTER — Ambulatory Visit (HOSPITAL_COMMUNITY)
Admission: RE | Admit: 2018-03-01 | Discharge: 2018-03-01 | Disposition: A | Discharge status: Home / Self Care | Payer: Medicare Other | Source: Ambulatory Visit | Attending: Neurology | Admitting: Neurology

## 2018-03-01 DIAGNOSIS — R490 Dysphonia: Secondary | ICD-10-CM | POA: Insufficient documentation

## 2018-03-01 DIAGNOSIS — G20 Parkinson's disease: Secondary | ICD-10-CM

## 2018-03-01 DIAGNOSIS — K766 Portal hypertension: Secondary | ICD-10-CM

## 2018-03-01 DIAGNOSIS — R1310 Dysphagia, unspecified: Secondary | ICD-10-CM | POA: Diagnosis not present

## 2018-03-01 DIAGNOSIS — I8500 Esophageal varices without bleeding: Secondary | ICD-10-CM

## 2018-04-04 ENCOUNTER — Ambulatory Visit: Payer: Medicare Other | Admitting: Speech Pathology

## 2018-04-06 ENCOUNTER — Other Ambulatory Visit: Payer: Self-pay | Admitting: Neurology

## 2018-04-06 ENCOUNTER — Telehealth: Payer: Self-pay | Admitting: Neurology

## 2018-04-06 MED ORDER — CARBIDOPA-LEVODOPA 25-100 MG PO TABS: 1.0000 | ORAL_TABLET | Freq: Four times a day (QID) | ORAL | 2 refills | Status: DC

## 2018-04-06 NOTE — Telephone Encounter (Signed)
The patient has switched pharmacies to [facility]. I informed him I have not received a request for anything but that I can send a refill to them. Advised that I will send a 3 mth supply for the pt to the mail service pharmacy for him. Pt verbalized understanding and was appreciative.

## 2018-04-06 NOTE — Telephone Encounter (Signed)
Pt is asking for a call from RN to discuss his carbidopa-levodopa (SINEMET IR) 25-100 MG tablet Pt states [facility] is supposed to be sending paperwork to Dr Brett Fairy and they have not received a response.  Pt is asking for a call

## 2018-04-12 DIAGNOSIS — D225 Melanocytic nevi of trunk: Secondary | ICD-10-CM | POA: Diagnosis not present

## 2018-04-12 DIAGNOSIS — L821 Other seborrheic keratosis: Secondary | ICD-10-CM | POA: Diagnosis not present

## 2018-04-12 DIAGNOSIS — D2271 Melanocytic nevi of right lower limb, including hip: Secondary | ICD-10-CM | POA: Diagnosis not present

## 2018-04-12 DIAGNOSIS — D0339 Melanoma in situ of other parts of face: Secondary | ICD-10-CM | POA: Diagnosis not present

## 2018-04-12 DIAGNOSIS — Z85828 Personal history of other malignant neoplasm of skin: Secondary | ICD-10-CM | POA: Diagnosis not present

## 2018-04-14 ENCOUNTER — Other Ambulatory Visit: Payer: Self-pay

## 2018-04-14 ENCOUNTER — Ambulatory Visit: Payer: Medicare Other | Attending: Neurology

## 2018-04-14 DIAGNOSIS — R1312 Dysphagia, oropharyngeal phase: Secondary | ICD-10-CM | POA: Insufficient documentation

## 2018-04-14 DIAGNOSIS — R471 Dysarthria and anarthria: Secondary | ICD-10-CM

## 2018-04-14 NOTE — Patient Instructions (Signed)
   TWICE A DAY:   15- 20 HARD swallows - with water if you'd like  Lay down, raise your head and look at your feet for 45 seconds - 3 times

## 2018-04-14 NOTE — Therapy (Signed)
[facility] [address] [place], [place], [address] Phone: [phone]   Fax:  [phone]  Speech Language Pathology Evaluation  Patient Details  Name: [name] MRN: [id number] Date of Birth: 1940/08/31 Referring Provider (SLP): Dohmeier, Asencion Partridge, MD   Encounter Date: 04/14/2018  End of Session - 04/14/18 1008    Visit Number  1    Number of Visits  17    Date for SLP Re-Evaluation  07/13/18   90 days   SLP Start Time  0859    SLP Stop Time   0950    SLP Time Calculation (min)  51 min    Activity Tolerance  Patient tolerated treatment well       Past Medical History:  Diagnosis Date  . Anemia   . Arthritis    "thumbs" (11/29/2013)  . Atrial fibrillation [facility]) 2006   Dr Tonny Bollman af-no meds  . colon ca dx'd 02/2011   COLON CANCER  . Diverticulosis    Sigmoid colon  . Esophageal stricture   . 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 Fracture of distal femur ([place]) 11/29/2013  . GERD (gastroesophageal reflux disease) 03-31-11   tx. Omeprazole  . Hearing loss    has bilateral hearing aids  . Hernia, abdominal    at the umbilicus  . History of hemorrhoids   . History of irregular heartbeat   . Hypercholesterolemia   . Hyperlipidemia   . Neuropathy 2013   as a result of chemo  . OSA on CPAP dx'd 2010   uses cap nightly  . Oxygen deficiency   . Personal history of colonic polyps 09/17/2009   Tubular adenoma  . Pneumonia 01/2013  . 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 Sleep apnea    uses c pap at home  . TBI (traumatic brain injury) ([place]) 1986   S/P fall; "slow to get going q morning" (10//22/2015)    Past Surgical History:  Procedure Laterality Date  . APPENDECTOMY  03/2011  . CARDIAC CATHETERIZATION  07/2002   Dr Wynonia Lawman  . COLON SURGERY  2013  . COLONOSCOPY  sept 2016   upper endo and  colonoscopy  . ESOPHAGEAL DILATION  2006  . ESOPHAGOGASTRODUODENOSCOPY (EGD) WITH PROPOFOL N/A 02/25/2016   Procedure: ESOPHAGOGASTRODUODENOSCOPY (EGD) WITH PROPOFOL;  Surgeon: Manus Gunning, MD;  Location: [facility];  Service: Gastroenterology;  Laterality: N/A;  . ESOPHAGOGASTRODUODENOSCOPY (EGD) WITH PROPOFOL N/A 03/10/2016   Procedure: ESOPHAGOGASTRODUODENOSCOPY (EGD) WITH PROPOFOL;  Surgeon: Manus Gunning, MD;  [facility];  Service: Gastroenterology;  Laterality: N/A;  . GASTRIC VARICES BANDING N/A 03/10/2016   Procedure: GASTRIC VARICES BANDING;  Surgeon: Manus Gunning, MD;  [facility];  Service: Gastroenterology;  Laterality: N/A;  . INGUINAL HERNIA REPAIR Left ~ 1991  . LAPAROSCOPIC PARTIAL COLECTOMY  04/08/2011  . ORIF DISTAL FEMUR FRACTURE Right 11/29/2013   "put plate in"  . ORIF FEMUR FRACTURE Right 11/29/2013   Procedure: OPEN REDUCTION INTERNAL FIXATION (ORIF) RIGHT  DISTAL FEMUR FRACTURE;  Surgeon: Rozanna Box, MD;  Location: [facility];  Service: Orthopedics;  Laterality: Right;  . PORT-A-CATH REMOVAL  11/09/2011   Procedure: REMOVAL PORT-A-CATH;  Surgeon: Odis Hollingshead, MD;  Location: [facility];  Service: General;  Laterality: N/A;  . PORTACATH PLACEMENT  04/30/2011   Procedure: INSERTION PORT-A-CATH;  Surgeon: Odis Hollingshead, MD;  Location: [facility];  Service: General;  Laterality: Right;    There were no vitals filed for this visit.  Subjective Assessment - 04/14/18 0906    Subjective  "I couldn't swallow my pills."    Patient is accompained by:  Family member   wife   Currently in Pain?  No/denies         SLP Evaluation [facility] - 04/14/18 7169      SLP Visit Information   SLP Received On  04/14/18    Referring Provider (SLP)  Dohmeier, Asencion Partridge, MD    Onset Date  November 2019    Medical Diagnosis  Parkinson's Disease      General Information   HPI  Pt diagnosed with PD in November 2019, was  having trouble with pills, referred for MBSS,       Oral Motor/Sensory Function   Overall Oral Motor/Sensory Function  Impaired    Labial ROM  Within Functional Limits    Labial Symmetry  Within Functional Limits    Labial Strength  Reduced    Labial Coordination  Reduced    Lingual ROM  Reduced right;Reduced left   better with visual cues   Lingual Symmetry  Within Functional Limits    Lingual Strength  Reduced Left    Lingual Coordination  Reduced    Velum  Within Functional Limits      Motor Speech   Overall Motor Speech  Impaired    Phonation  Low vocal intensity   10 minutes of conversation averaged upper 60s low 70s dB   Phonation  --   pt reports wife asking him to repeat moreso in last 12 month    During speech evaluation, pt was educated about other therapies which might benefit him for the future: Physical and Occupational therapies.   Prior Functional Status - 04/14/18 1002      Prior Functional Status   Cognitive/Linguistic Baseline  Within functional limits    Type of Home  House     Lives With  Spouse    Available Help at Discharge  Family    Vocation  Retired        "Bedside" Swallow Evaluation - 04/14/18 1003      General Information   Type of Study  Bedside Swallow Evaluation    Diet Prior to this Study  Regular    Temperature Spikes Noted  No    Respiratory Status  Room air    History of Recent Intubation  No    Behavior/Cognition  Alert;Cooperative;Pleasant mood    Oral Cavity Assessment  Dry    Oral Care Completed by SLP  No    Self-Feeding Abilities  Able to feed self    Patient Positioning  Upright in chair    Baseline Vocal Quality  Low vocal intensity    Volitional Cough  Strong    Volitional Swallow  Able to elicit         Thin Liquid - 04/14/18 1004      Thin Liquid   Thin Liquid  Impaired    Presentation  Cup    Pharyngeal  Phase Impairments  --         Solid - 04/14/18 1006      Solid   Solid  Impaired    Oral Phase  Impairments  Reduced lingual movement/coordination    Oral Phase Functional Implications  --    Other Comments  no pharyngeal difficulties observed          SLP Education -  04/14/18 1001    Education Details  [place], Effortful swallow, lingual ROM in sulci, precautions, results of modified, "hock" *and reswallow*, chin tuck not necessary,    Person(s) Educated  Patient;Spouse    Methods  Explanation;Demonstration;Verbal cues    Comprehension  Verbalized understanding;Returned demonstration;Verbal cues required;Need further instruction       SLP Short Term Goals - 04/14/18 1352      SLP SHORT TERM GOAL #1   Title  pt will demo swallow HEP with rare min A over three sessions    Time  4    Period  Weeks    Status  New      SLP SHORT TERM GOAL #2   Title  pt will demo swallow precautions with POs over three sessions    Time  4    Period  Weeks    Status  New      SLP SHORT TERM GOAL #3   Title  pt will demo expiratory/inspiratory muscle strength HEP with rare min A over three sessions    Time  4    Period  Weeks    Status  New      SLP SHORT TERM GOAL #4   Title  pt will produce 17/20 sentences with volume in low 70s dB over three sessions    Time  4    Period  Weeks    Status  New       SLP Long Term Goals - 04/14/18 1354      SLP LONG TERM GOAL #1   Title  pt will demo swallowing HEP with modified indepndence over two sessions    Time  8    Period  Weeks   or 17 visits   Status  New      SLP LONG TERM GOAL #2   Title  pt will demo respiratory muscle strength training exercises with rare min A over 6 total sessions    Time  8    Period  Weeks    Status  New      SLP LONG TERM GOAL #3   Title  pt will maintain average volume of low 70s dB in 10 minutes simple conversation    Time  8    Period  Weeks    Status  New      SLP LONG TERM GOAL #4   Title  wife will report she has asked pt less to repeat himself than prior to ST in 4 sessions    Time  8     Period  Weeks    Status  New      SLP LONG TERM GOAL #5   Title  pt will demonstrate improved swallow skills on an objective swallow assessment    Time  8    Period  Weeks    Status  New       Plan - 04/14/18 1009    Clinical Impression Statement  Pt presents today with (ID'd via modified) mild oropharyngeal dysphagia with decr'd oral bolus propulsion and mild delay with transiting with boluses of incr'd viscosity, mildly reduced tongue base retraction resulting in residuals that were not sensed. Effective techniques were cued "hock" and reswallow, and effortful swallows. Min penetration of thin, SILENT aspiration with thin WITH HEAD TURN rt. Regular/thin recommended with precautions of small bites/sips, slow rate, effortufl swallow, "hock" and reswallow, drinking liquids throughout meal. Pt also presents today with borderline decr'd speech volumes (upper 60s-low 70s dB) in conversation -  reports wife has more frequently asked him to repeat himself in the last 9-12 months - wife nodded in agreement. Pt would benefit from skilled ST to incr oromuscular strengthening, improve timing of swallow between oral and pharyngeal stages, train in use of swallow compensations/aspiration precautions, and increase conversational speech volume to consistently WNL.    Speech Therapy Frequency  2x / week    Duration  --   8 weeks, or 17 sessions   Treatment/Interventions  Aspiration precaution training;Pharyngeal strengthening exercises;Diet toleration management by SLP;Internal/external aids;Patient/family education;Compensatory strategies;Functional tasks;SLP instruction and feedback;Environmental controls    Potential to [place]  provided a few exercises today; full HEP to follow    Consulted and Agree with Plan of Care  Patient       Patient will benefit from skilled therapeutic intervention in order to improve the following deficits and impairments:   Dysphagia,  oropharyngeal - Plan: SLP plan of care cert/re-cert  Dysarthria and anarthria - Plan: SLP plan of care cert/re-cert    Problem List Patient Active Problem List   Diagnosis Date Noted  . Parkinsonism ([place]) 10/24/2017  . Portal hypertension with esophageal varices (HCC) 10/24/2017  . Resting tremor 10/24/2017  . Titubation 10/24/2017  . Cerebellar ataxia in diseases classified elsewhere ([place]) 10/24/2017  . Bleeding esophageal varices ([place])   . Acute GI bleeding   . Shortness of breath at rest   . Upper GI bleed 02/25/2016  . History of colon cancer 02/25/2016  . Thrombocytopenia ([place]) 02/25/2016  . History of esophageal varices   . Maxillary sinusitis, acute 05/06/2015  . Fibrosis of liver   . 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 Fracture of distal femur ([place]) 11/29/2013  . CAP (community acquired pneumonia) 02/12/2013  . Viral URI with cough 01/24/2013  . CAD (coronary artery disease)   . Nephrolithiasis   . Malignant neoplasm of ascending colon ([place]) 03/03/2011  . 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 Disorder of bilirubin excretion   . Obstructive sleep apnea 12/14/2007  . HYPERGLYCEMIA, FASTING 12/13/2007  . Atrial fibrillation ([address]) 10/03/2007  . Hyperlipidemia 09/22/2006  . 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 BPH (benign prostatic hypertrophy)     Garald Balding ,MS, CCC-SLP  04/14/2018, 2:00 PM  [facility] [address] [place], [place], [address] Phone: [phone]   Fax:  [phone]  Name: NIKOLAUS PIENTA MRN: [id number] Date of Birth: 17-Sep-1940

## 2018-04-17 ENCOUNTER — Ambulatory Visit: Payer: Medicare Other | Admitting: Speech Pathology

## 2018-04-17 DIAGNOSIS — R1312 Dysphagia, oropharyngeal phase: Secondary | ICD-10-CM

## 2018-04-17 DIAGNOSIS — R471 Dysarthria and anarthria: Secondary | ICD-10-CM

## 2018-04-17 NOTE — Patient Instructions (Signed)
You have been issued a Respiratory Muscle Strength Trainer(s) to increase lung strength for improved speech and/or swallowing abilities. Please follow the instructions below provided by your Speech-Language Pathologist to complete your exercises.  Expiratory Muscle Training  [place] Device - "Blue Equals Blow"  . Perform  5 repetitions, 5 times per day . Device set at 60 cm H20  How to perform: . Take a big breath, then blow hard into the trainer (device) . Rest for at least 2 seconds between repetitions  What you should feel/hear: Burst of air through the device  Inspiratory Muscle Training Clear Device - "Suck in"  . Perform 5 repetitions, 5 times per day . Device set at 23 cm H2O  How to perform: . Place trainer in your mouth and then suck into the trainer (device) . Rest for at least 2 seconds between repetitions  What you should feel/hear: Burst of air through the device  Take a break or discontinue use if you experience lightheadedness, dizziness, pain, shortness of breath, color change, or excessive sweating. Any questions, call [phone]. Please bring your device(s) to therapy sessions.

## 2018-04-17 NOTE — Therapy (Signed)
[facility] [address] [place], [place], [address] Phone: [phone]   Fax:  [phone]  Speech Language Pathology Treatment  Patient Details  Name: [name] MRN: [id number] Date of Birth: 28-Dec-1940 Referring Provider (SLP): Dohmeier, Asencion Partridge, MD   Encounter Date: 04/17/2018  End of Session - 04/17/18 2201    Visit Number  2    Number of Visits  17    Date for SLP Re-Evaluation  07/13/18   90 days   SLP Start Time  1018    SLP Stop Time   1100    SLP Time Calculation (min)  42 min    Activity Tolerance  Patient tolerated treatment well       Past Medical History:  Diagnosis Date  . Anemia   . Arthritis    "thumbs" (11/29/2013)  . Atrial fibrillation [facility]) 2006   Dr Tonny Bollman af-no meds  . colon ca dx'd 02/2011   COLON CANCER  . Diverticulosis    Sigmoid colon  . Esophageal stricture   . Esophageal varices determined by endoscopy ([place]) sept 2016  . Fracture of distal femur ([place]) 11/29/2013  . GERD (gastroesophageal reflux disease) 03-31-11   tx. Omeprazole  . Hearing loss    has bilateral hearing aids  . Hernia, abdominal    at the umbilicus  . History of hemorrhoids   . History of irregular heartbeat   . Hypercholesterolemia   . Hyperlipidemia   . Neuropathy 2013   as a result of chemo  . OSA on CPAP dx'd 2010   uses cap nightly  . Oxygen deficiency   . Personal history of colonic polyps 09/17/2009   Tubular adenoma  . Pneumonia 01/2013  . 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 Sleep apnea    uses c pap at home  . TBI (traumatic brain injury) ([place]) 1986   S/P fall; "slow to get going q morning" (10//22/2015)    Past Surgical History:  Procedure Laterality Date  . APPENDECTOMY  03/2011  . CARDIAC CATHETERIZATION  07/2002   Dr Wynonia Lawman  . COLON SURGERY  2013  . COLONOSCOPY  sept 2016   upper endo and  colonoscopy  . ESOPHAGEAL DILATION  2006  . ESOPHAGOGASTRODUODENOSCOPY (EGD) WITH PROPOFOL N/A 02/25/2016   Procedure: ESOPHAGOGASTRODUODENOSCOPY (EGD) WITH PROPOFOL;  Surgeon: Manus Gunning, MD;  [facility];  Service: Gastroenterology;  Laterality: N/A;  . ESOPHAGOGASTRODUODENOSCOPY (EGD) WITH PROPOFOL N/A 03/10/2016   Procedure: ESOPHAGOGASTRODUODENOSCOPY (EGD) WITH PROPOFOL;  Surgeon: Manus Gunning, MD;  [facility];  Service: Gastroenterology;  Laterality: N/A;  . GASTRIC VARICES BANDING N/A 03/10/2016   Procedure: GASTRIC VARICES BANDING;  Surgeon: Manus Gunning, MD;  [facility];  Service: Gastroenterology;  Laterality: N/A;  . INGUINAL HERNIA REPAIR Left ~ 1991  . LAPAROSCOPIC PARTIAL COLECTOMY  04/08/2011  . ORIF DISTAL FEMUR FRACTURE Right 11/29/2013   "put plate in"  . ORIF FEMUR FRACTURE Right 11/29/2013   Procedure: OPEN REDUCTION INTERNAL FIXATION (ORIF) RIGHT  DISTAL FEMUR FRACTURE;  Surgeon: Rozanna Box, MD;  Location: [facility];  Service: Orthopedics;  Laterality: Right;  . PORT-A-CATH REMOVAL  11/09/2011   Procedure: REMOVAL PORT-A-CATH;  Surgeon: Odis Hollingshead, MD;  Location: [facility];  Service: General;  Laterality: N/A;  . PORTACATH PLACEMENT  04/30/2011   Procedure: INSERTION PORT-A-CATH;  Surgeon: Odis Hollingshead, MD;  Location: [facility];  Service: General;  Laterality: Right;    There were no vitals filed for this visit.  Subjective Assessment - 04/17/18 2141    Subjective  Pt describes exercises given by SLP at eval.            ADULT SLP TREATMENT - 04/17/18 2154      General Information   Behavior/Cognition  Alert;Cooperative;Pleasant mood      Treatment Provided   Treatment provided  Dysphagia      Dysphagia Treatment   Temperature Spikes Noted  No    Respiratory Status  Room air    Oral Cavity - Dentition  Adequate natural dentition    Treatment Methods  Therapeutic  exercise;Patient/caregiver education    Patient observed directly with PO's  No    Type of cueing  Verbal    Amount of cueing  Moderate    Other treatment/comments  Pt able to describe exercises given by SLP in previous session in adequate detail. Pt with questions re: muscle groups targeted by HEP and so SLP explained pt's deficits seen on MBS and exercises targeted to address pt's specific deficits. Pt eager to use RMT device he bought on [place]: "A speech therapist told me I should get this." SLP assessed pt's maximum inspiratory (MIP) and expiratory pressures (MEP). Pt's MIP is 31 cm H20, below normal for age (ref 50.6-88.8 cm H20), and MEP is WNL at 80.3 (ref 53.9-110.9). SLP provided inspiratory and expiratory trainers to pt, as pt's trainer did not have reference for cm H20 adjustments. Expiratory device set to 60 cm H20 and inspiratory to 23cm H20, 75% of pt's maximum. Pt demo'd 2 sets of 5 reps with each device, recalled feedback from previous therapist re: technique.       Pain Assessment   Pain Assessment  No/denies pain      Assessment / Recommendations / Plan   Plan  Continue with current plan of care      Dysphagia Recommendations   Diet recommendations  Regular;Thin liquid    Liquids provided via  Cup;Straw    Medication Administration  Crushed with puree    Supervision  Patient able to self feed    Compensations  Slow rate;Small sips/bites;Follow solids with liquid;Multiple dry swallows after each bite/sip;Effortful swallow   "hock" /reswallow to clear solids   Postural Changes and/or Swallow Maneuvers  Upright 30-60 min after meal      Progression Toward Goals   Progression toward goals  Progressing toward goals         SLP Short Term Goals - 04/17/18 2204      SLP SHORT TERM GOAL #1   Title  pt will demo swallow HEP with rare min A over three sessions    Time  4    Period  Weeks    Status  On-going      SLP SHORT TERM GOAL #2   Title  pt will demo swallow  precautions with POs over three sessions    Time  4    Period  Weeks    Status  On-going      SLP SHORT TERM GOAL #3   Title  pt will demo expiratory/inspiratory muscle strength HEP with rare min A over three sessions    Time  4    Period  Weeks    Status  On-going      SLP SHORT TERM GOAL #4   Title  pt will produce 17/20 sentences with volume in low 70s dB over  three sessions    Time  4    Period  Weeks    Status  On-going       SLP Long Term Goals - 04/17/18 2204      SLP LONG TERM GOAL #1   Title  pt will demo swallowing HEP with modified indepndence over two sessions    Time  8    Period  Weeks   or 17 visits   Status  On-going      SLP LONG TERM GOAL #2   Title  pt will demo respiratory muscle strength training exercises with rare min A over 6 total sessions    Time  8    Period  Weeks    Status  On-going      SLP LONG TERM GOAL #3   Title  pt will maintain average volume of low 70s dB in 10 minutes simple conversation    Time  8    Period  Weeks    Status  On-going      SLP LONG TERM GOAL #4   Title  wife will report she has asked pt less to repeat himself than prior to ST in 4 sessions    Time  8    Period  Weeks    Status  On-going      SLP LONG TERM GOAL #5   Title  pt will demonstrate improved swallow skills on an objective swallow assessment    Time  8    Period  Days    Status  On-going       Plan - 04/17/18 2201    Clinical Impression Statement  Pt presents mild oropharyngeal dysphagia per MBS with decr'd oral bolus propulsion and mild delay with transiting with boluses of incr'd viscosity, mildly reduced tongue base retraction resulting in residuals that were not sensed. Pt also presents today with borderline decr'd speech volumes (upper 60s-low 70s dB) in conversation. Respiratory muscle strength training routine provided today after assessing pt's MIP and MEP. Pt would benefit from skilled ST to incr oromuscular strengthening, improve timing of  swallow between oral and pharyngeal stages, train in use of swallow compensations/aspiration precautions, and increase conversational speech volume to consistently WNL.    Speech Therapy Frequency  2x / week    Duration  --   8 weeks, or 17 sessions   Treatment/Interventions  Aspiration precaution training;Pharyngeal strengthening exercises;Diet toleration management by SLP;Internal/external aids;Patient/family education;Compensatory strategies;Functional tasks;SLP instruction and feedback;Environmental controls    Potential to [place]  provided a few exercises today; full HEP to follow    Consulted and Agree with Plan of Care  Patient       Patient will benefit from skilled therapeutic intervention in order to improve the following deficits and impairments:   Dysphagia, oropharyngeal  Dysarthria and anarthria    Problem List Patient Active Problem List   Diagnosis Date Noted  . Parkinsonism ([place]) 10/24/2017  . Portal hypertension with esophageal varices (HCC) 10/24/2017  . Resting tremor 10/24/2017  . Titubation 10/24/2017  . Cerebellar ataxia in diseases classified elsewhere ([place]) 10/24/2017  . Bleeding esophageal varices ([place])   . Acute GI bleeding   . Shortness of breath at rest   . Upper GI bleed 02/25/2016  . History of colon cancer 02/25/2016  . Thrombocytopenia ([place]) 02/25/2016  . History of esophageal varices   . Maxillary sinusitis, acute 05/06/2015  . Fibrosis of liver   .  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 Fracture of distal femur ([place]) 11/29/2013  . CAP (community acquired pneumonia) 02/12/2013  . Viral URI with cough 01/24/2013  . CAD (coronary artery disease)   . Nephrolithiasis   . Malignant neoplasm of ascending colon ([place]) 03/03/2011  . 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 Disorder of bilirubin excretion   . Obstructive sleep apnea 12/14/2007  . HYPERGLYCEMIA, FASTING 12/13/2007  . Atrial  fibrillation ([place]) 10/03/2007  . Hyperlipidemia 09/22/2006  . 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 BPH (benign prostatic hypertrophy)    Deneise Lever, [place], CCC-SLP Speech-Language Pathologist  Aliene Altes 04/17/2018, 10:05 PM  [facility] [address] [place], [place], [address] Phone: [phone]   Fax:  [phone]   Name: [name] MRN: [id number] Date of Birth: 1940/10/22

## 2018-04-19 ENCOUNTER — Encounter: Payer: Self-pay | Admitting: Neurology

## 2018-04-19 ENCOUNTER — Ambulatory Visit: Payer: Medicare Other | Admitting: Neurology

## 2018-04-19 ENCOUNTER — Other Ambulatory Visit: Payer: Self-pay

## 2018-04-19 ENCOUNTER — Other Ambulatory Visit: Payer: Self-pay | Admitting: Neurology

## 2018-04-19 VITALS — BP 118/65 | HR 65 | Ht 74.0 in | Wt 177.0 lb

## 2018-04-19 DIAGNOSIS — I4819 Other persistent atrial fibrillation: Secondary | ICD-10-CM | POA: Diagnosis not present

## 2018-04-19 DIAGNOSIS — I2510 Atherosclerotic heart disease of native coronary artery without angina pectoris: Secondary | ICD-10-CM

## 2018-04-19 DIAGNOSIS — G20C Parkinsonism, unspecified: Secondary | ICD-10-CM

## 2018-04-19 DIAGNOSIS — G4733 Obstructive sleep apnea (adult) (pediatric): Secondary | ICD-10-CM

## 2018-04-19 DIAGNOSIS — C182 Malignant neoplasm of ascending colon: Secondary | ICD-10-CM

## 2018-04-19 DIAGNOSIS — G20 Parkinson's disease: Secondary | ICD-10-CM | POA: Diagnosis not present

## 2018-04-19 NOTE — Progress Notes (Signed)
SLEEP MEDICINE CLINIC   Provider:  Larey Seat, M D  Primary Care Physician:  Burnard Bunting, MD Referring Provider: Burnard Bunting, MD    Chief Complaint  Patient presents with  . Follow-up    pt alone, rm 11. pt states that things are ok with exception of having difficulty with relaxing at bedtime. he states there has been times that he has laid awake until 4 am because his muscles felt tense and wouldnt relax to allow him to fall asleep    HPI:  RV 04-19-2018. [name] had a busy 3 month, [name] suffered a stroke. Here is our last visit plan: 1) findings of asymmetric  tremor in dominant hand and arm, arm swing loss, masked face, dysphonia and rare blinking. He is stooped. This fits Parkinson's Disease.  2) He broke his right leg and stated he hasn't walked well again- this may account for the "flat footed " gait.  3) stooped posture - that's why we ordered MRI cervical spine, too.  4) atrial fibrillation, may have vascular component to PD.- this was not found.   He underwent swallowing examination , ST -  "Bedside" Swallow Evaluation - 04/14/18 1003            General Information   Type of Study  Bedside Swallow Evaluation    Diet Prior to this Study  Regular    Temperature Spikes Noted  No    Respiratory Status  Room air    History of Recent Intubation  No    Behavior/Cognition  Alert;Cooperative;Pleasant mood    Oral Cavity Assessment  Dry    Oral Care Completed by SLP  No    Self-Feeding Abilities  Able to feed self    Patient Positioning  Upright in chair    Baseline Vocal Quality  Low vocal intensity    Volitional Cough  Strong    Volitional Swallow  Able to elicit          Thin Liquid - 04/14/18 1004            Thin Liquid   Thin Liquid  Impaired    Presentation  Cup    Pharyngeal  Phase Impairments  --          Solid - 04/14/18 1006            Solid   Solid  Impaired    Oral Phase Impairments   Reduced lingual movement/coordination    Oral Phase Functional Implications  --    Other Comments  no pharyngeal difficulties observed        SLP Education - 04/14/18 1001    Education Details  Shaker, Effortful swallow, lingual ROM in sulci, precautions, results of modified, "hock" *and reswallow*, chin tuck not necessary,    Person(s) Educated  Patient;Spouse    Methods  Explanation;Demonstration;Verbal cues    Comprehension  Verbalized understanding;Returned demonstration;Verbal cues required;Need further instruction           SLP Short Term Goals - 04/14/18 1352            SLP SHORT TERM GOAL #1   Title  pt will demo swallow HEP with rare min A over three sessions    Time  4    Period  Weeks    Status  New        SLP SHORT TERM GOAL #2   Title  pt will demo swallow precautions with POs over three sessions  Time  4    Period  Weeks    Status  New        SLP SHORT TERM GOAL #3   Title  pt will demo expiratory/inspiratory muscle strength HEP with rare min A over three sessions    Time  4    Period  Weeks    Status  New        SLP SHORT TERM GOAL #4   Title  pt will produce 17/20 sentences with volume in low 70s dB over three sessions    Time  4    Period  Weeks    Status  New      MRI brain from 9- 19-2006 was again reviewed.  No kidney failure, a transplant surgeon has told him that the hepatic function is well. He has esophageal varices, Vena Porta pressure elevated. He doesn't have cirrhosis.  He never had a transplant.   He has pain in the bottom of both feet and fingertips. Right leg high level of sensory loss than the left. He has spasms, cramps in feet.   He was so busy, he has not noted if sinemet had an added benefit. Tremor is right arm and hand dominant.     Rv 12-13-2017 ,  [name] has been taking Sinemet 100/25 mg tid since last visit with me.  He is not sure if he has seen much improvement also I noticed  that his right hand has a much milder resting tremor, and his wife agrees that his tremor amplitude and the fluidity of his movements have both had a positive response.  He continues to take Adderall as prescribed by psychiatry, he struggles with hearing loss, he is status post chemotherapy, he has been unable to take iron supplements because he did not want and to interfere with the carbidopa-levodopa therapy. He has a gait impairment since left femur fracture in 2015.  He has always felt that his neuropathic symptoms and his leg cramps worsened or actually set on a new after that fracture occurred.  He came home from the surgery to repair the fracture and felt as if he "slept on a bed of rocks." He has noticed some increase in leg cramps especially at night while on sinemet which I do not quite understand. He underwent MRI of the cervical spine and brain to evaluate further for Parkinson's versus spinal stenosis.    Chief complaint according to patient : "My right hand is trembling " Dr. [name] referred now for tremor evaluation.  I have the pleasure of meeting [name] 10-24-2017 , who reports that his right arm and hand has been trembling.  This is clearly visible at rest he has a pill-rolling tremor and there is actually some tremor in both upper extremities.  His deep tendon reflexes appear symmetric, there is rigor  over both shoulders and a little bit of crepitation over the left.  He lost muscle mass. He does have a decreased facial mimic, he has titubation.  No abnormal eye movements, there is some dysphonia or hoarseness of the voice noted.  [name] is a 78 y.o. male patient who was seen here previously in 2010 - as a patient of [name], MD for a sleep apnea evaluation.  The patient had mild apnea- and was already on CPAP. I referred to Neuropsychology PhD , and from there on to Psychiatry - Dr. Casimiro Needle. The patient continued to use his CPAP, but he also started on Adderall with  Dr. Casimiro Needle and has been doing well for the last 6 or 7 years. Today's referral is not for sleep but for tremor evaluation.  The patient apparently carries already the diagnosis of benign essential tremor, but he has noted that his right hand and upper extremity tremor has worsened and the unilateral nature is of more concern.  He has a history of neuropathy, renal portal hypertension, anemia, reflux, thrombocytopenia, hypertension atrial fibrillation, Colon cancer dx in 2/ 2013, with liver damage through chemotherapy, a traumatic brain injury and obstructive sleep apnea.  He has undergone a colon resection in 2013-I had seen him before this diagnosis was made- and a fractured leg in 2015 , he had varicella-zoster 2018 in 2019. He had a upper gi bleed, esophageal veinous bleed with banding in 2018.  Social history: married, no smoker, non drinker. Daughter is 36- behavior analyst , son is 29. Retired from [employer] and [employer].  Caffeine : iced tea, diet sodas. 3-4 a day.    Review of Systems: Out of a complete 14 system review, the patient complains of only the following symptoms, and all other reviewed systems are negative.  Tremor at rest , not with maneuvers.   Imaging:STUDY DATE: 11/05/2017 PATIENT NAME: [name] DOB: 1940/09/26 MRN: [id number]  EXAM: MRI of the cervical spine without contrast  ORDERING CLINICIAN: Asencion Partridge Rasool Rommel MD CLINICAL HISTORY: 78 year old man with right arm tremor and parkinsonism COMPARISON FILMS: None  TECHNIQUE: MRI of the cervical spine was obtained utilizing 3 mm sagittal slices from the posterior fossa down to the T3-4 level with T1, T2 and inversion recovery views. In addition 4 mm axial slices from M4-2 down to T1-2 level were included with T2 and gradient echo views. CONTRAST: None IMAGING SITE:  imaging, [place], [place], [place]  FINDINGS: :  On sagittal images, the spine is imaged from above the cervicomedullary  junction to T3.   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  A cyst is noted in the skull just right of midline.  The spinal cord is of normal caliber and signal.   The vertebral bodies are normally aligned.   The vertebral bodies have normal signal.    The discs and interspaces were further evaluated on axial views from C2 to T1 as follows:  C2-C3: The disc and interspace appear normal.  C3-C4: There is mild disc bulging and uncovertebral spurring causing minimal foraminal narrowing but no nerve root compression or spinal stenosis.  C4-C5: There is mild spinal stenosis due to disc bulging, uncovertebral spurring and left greater than right facet hypertrophy.  There is moderate left greater than right foraminal narrowing but no definite nerve root compression.  C5-C6: There is disc bulging, uncovertebral spurring and minimal left facet hypertrophy.  There is mild left and minimal right foraminal narrowing but no nerve root compression.  C6-C7: There is a small central disc bulge that does not lead to spinal stenosis or nerve root compression.  C7-T1: The disc and interspace appear normal.  T1-T2: The disc and interspace appear normal.  T2-T3: This level is only evaluated on the sagittal images but there appears to be a disc protrusion distorting the thecal sac without causing spinal cord compression.  There is no nerve root compression.   IMPRESSION: This MRI of the cervical spine without contrast shows the following: 1.    The spinal cord appears normal. 2.    At C4-C5 there is mild spinal stenosis and moderate left  greater than right foraminal narrowing.  There does not appear to be nerve root compression. 3.    At T2-T3, there is a disc protrusion that distorts the thecal sac without causing spinal cord compression.  There is no nerve root compression. 4.    There are milder degenerative changes at C3-C4, C5-C6 and C6-C7 thatt do not lead to spinal  stenosis or nerve root compression.     INTERPRETING PHYSICIAN:  Richard A. Felecia Shelling, MD, PhD, FAAN Certified in  Neuroimaging by [employer] IMPRESSION: This MRI of the brain with and without contrast shows the following: 1.    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   2.    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 3.    Mild to moderate generalized cortical atrophy that has progressed since the 2006 CT scan. 4.    There are no acute findings.  I showed the patient these images, which do no explain his PD symptoms.   Social History   Socioeconomic History  . Marital status: Married    Spouse name: Not on file  . Number of children: 2  . Years of education: Not on file  . Highest education level: Not on file  Occupational History  . Occupation: Retired  Scientific laboratory technician  . Financial resource strain: Not on file  . Food insecurity:    Worry: Not on file    Inability: Not on file  . Transportation needs:    Medical: Not on file    Non-medical: Not on file  Tobacco Use  . Smoking status: Former Smoker    Packs/day: 0.50    Years: 7.00    Pack years: 3.50    Types: Cigarettes    Last attempt to quit: 03/21/1961    Years since quitting: 57.1  . Smokeless tobacco: Never Used  Substance and Sexual Activity  . Alcohol use: No    Alcohol/week: 0.0 standard drinks  . Drug use: No  . Sexual activity: Yes  Lifestyle  . Physical activity:    Days per week: Not on file    Minutes per session: Not on file  . Stress: Not on file  Relationships  . Social connections:    Talks on phone: Not on file    Gets together: Not on file    Attends religious service: Not on file    Active member of club or organization: Not on file    Attends meetings of clubs or organizations: Not on file    Relationship status: Not on file  . Intimate partner violence:    Fear of  current or ex partner: Not on file    Emotionally abused: Not on file    Physically abused: Not on file    Forced sexual activity: Not on file  Other Topics Concern  . Not on file  Social History Narrative  . Not on file    Family History  Problem Relation Age of Onset  . Diabetes Father   . Prostate cancer Father   . Heart attack Father 3  . Heart failure Mother        Congestive heart failure  . Cancer Brother        colon  . Colon cancer Brother 29  . Stomach cancer Sister   . Colon cancer Other   . Colon cancer Sister   . Coronary artery disease Brother  CABG  . Lung cancer Brother   . Anesthesia problems Neg Hx   . Esophageal cancer Neg Hx     Past Medical History:  Diagnosis Date  . Anemia   . Arthritis    "thumbs" (11/29/2013)  . Atrial fibrillation [facility]) 2006   Dr Tonny Bollman af-no meds  . colon ca dx'd 02/2011   COLON CANCER  . Diverticulosis    Sigmoid colon  . Esophageal stricture   . Esophageal varices determined by endoscopy ([place]) sept 2016  . Fracture of distal femur ([place]) 11/29/2013  . GERD (gastroesophageal reflux disease) 03-31-11   tx. Omeprazole  . Hearing loss    has bilateral hearing aids  . Hernia, abdominal    at the umbilicus  . History of hemorrhoids   . History of irregular heartbeat   . Hypercholesterolemia   . Hyperlipidemia   . Neuropathy 2013   as a result of chemo  . OSA on CPAP dx'd 2010   uses cap nightly  . Oxygen deficiency   . Personal history of colonic polyps 09/17/2009   Tubular adenoma  . Pneumonia 01/2013  . 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 Sleep apnea    uses c pap at home  . TBI (traumatic brain injury) ([place]) 1986   S/P fall; "slow to get going q morning" (10//22/2015)    Past Surgical History:  Procedure Laterality Date  . APPENDECTOMY  03/2011  . CARDIAC CATHETERIZATION  07/2002   Dr Wynonia Lawman  . COLON SURGERY  2013  .  COLONOSCOPY  sept 2016   upper endo and colonoscopy  . ESOPHAGEAL DILATION  2006  . ESOPHAGOGASTRODUODENOSCOPY (EGD) WITH PROPOFOL N/A 02/25/2016   Procedure: ESOPHAGOGASTRODUODENOSCOPY (EGD) WITH PROPOFOL;  Surgeon: Manus Gunning, MD;  Location: [facility];  Service: Gastroenterology;  Laterality: N/A;  . ESOPHAGOGASTRODUODENOSCOPY (EGD) WITH PROPOFOL N/A 03/10/2016   Procedure: ESOPHAGOGASTRODUODENOSCOPY (EGD) WITH PROPOFOL;  Surgeon: Manus Gunning, MD;  Location: [facility];  Service: Gastroenterology;  Laterality: N/A;  . GASTRIC VARICES BANDING N/A 03/10/2016   Procedure: GASTRIC VARICES BANDING;  Surgeon: Manus Gunning, MD;  Location: [facility];  Service: Gastroenterology;  Laterality: N/A;  . INGUINAL HERNIA REPAIR Left ~ 1991  . LAPAROSCOPIC PARTIAL COLECTOMY  04/08/2011  . ORIF DISTAL FEMUR FRACTURE Right 11/29/2013   "put plate in"  . ORIF FEMUR FRACTURE Right 11/29/2013   Procedure: OPEN REDUCTION INTERNAL FIXATION (ORIF) RIGHT  DISTAL FEMUR FRACTURE;  Surgeon: Rozanna Box, MD;  Location: [facility];  Service: Orthopedics;  Laterality: Right;  . PORT-A-CATH REMOVAL  11/09/2011   Procedure: REMOVAL PORT-A-CATH;  Surgeon: Odis Hollingshead, MD;  Location: [facility];  Service: General;  Laterality: N/A;  . PORTACATH PLACEMENT  04/30/2011   Procedure: INSERTION PORT-A-CATH;  Surgeon: Odis Hollingshead, MD;  Location: [facility];  Service: General;  Laterality: Right;    Current Outpatient Medications  Medication Sig Dispense Refill  . amphetamine-dextroamphetamine (ADDERALL) 30 MG tablet Take 10-20 mg by mouth daily with breakfast.     . carbidopa-levodopa (SINEMET IR) 25-100 MG tablet Take 1 tablet by mouth 4 (four) times daily. 360 tablet 2  . ferrous sulfate 325 (65 FE) MG tablet Take 325 mg by mouth 2 (two) times daily.    . furosemide (LASIX) 20 MG tablet Take 1 tablet (20 mg total) by mouth daily. 90 tablet 0  . pantoprazole (PROTONIX) 40  MG tablet Take 1 tablet (  40 mg total) by mouth 2 (two) times daily. 60 tablet 0  . propranolol (INDERAL) 20 MG tablet Take 1 tablet (20 mg total) by mouth 2 (two) times daily. 60 tablet 0   No current facility-administered medications for this visit.     Allergies as of 04/19/2018  . (No Known Allergies)    Vitals: BP 118/65   Pulse 65   Ht 6\' 2"  (1.88 m)   Wt 177 lb (80.3 kg)   BMI 22.73 kg/m  Last Weight:  Wt Readings from Last 1 Encounters:  04/19/18 177 lb (80.3 kg)   DXA:JOIN mass index is 22.73 kg/m.     Last Height:   Ht Readings from Last 1 Encounters:  04/19/18 6\' 2"  (1.88 m)    Physical exam:  General: The patient is awake, alert and appears not in acute distress. The patient has a stooped position. He has hoarse voice with little cadence.  Head: Normocephalic, atraumatic.  Neck is supple. Mallampati 2. His movements are slowed.  neck circumference:15. Nasal airflow patent, titubation is seen.   Cardiovascular:  Regular rate and rhythm, without  murmurs or carotid bruit, and without distended neck veins.Respiratory: Lungs are clear to auscultation. Skin:  Without evidence of edema, or rash. Trunk: BMI is 22.7 .  Neurologic exam : The patient is awake and alert, oriented to place and time.   Memory subjective described as intact. Attention span & concentration ability appears normal.   Speech is fluent,  with more dysphonia/ He reports  improved dysphagia.   Mood and affect are appropriate.  Cranial nerves: Pupils are equal and briskly reactive to light. Funduscopic exam without evidence of pallor or edema. Rare blinking. Extraocular movements in vertical and horizontal planes intact and without nystagmus. Visual fields by finger perimetry are intact. Hearing to finger rub impaired - forgot hearing aids today . Facial sensation intact to fine touch. Facial expression is masked.  Motor strength is symmetric-  tongue and uvula move midline.  Tongue is not  tremolous.  Shoulder shrug was symmetrical.   Motor exam:  increased tone without cog wheeling, reduced  muscle bulk  ( very slim ) and symmetric strength in all extremities. Sensory:  Fine touch, pinprick and vibration were tested in all extremities.  There is no preserved sensation to vibration in either foot or ankle.   This began after chemotherapy 2012.  At the knee there is a reduced ability to feel vibration, the sensation is preserved in fingers hands and upper extremity.  Proprioception tested in the upper extremities was normal. Coordination: Rapid alternating movements in the fingers/hands was slowed - Finger-to-nose maneuver  normal without evidence of ataxia, dysmetria or tremor !  Gait and station: Patient walks without assistive device , his right leg appears stiff and shorter , the foot drops  .Tandem gait is unfragmented. He Turns with 3 Steps. He does not stride, walks stooped but is not shuffling. Loss of  Right arm swing and noted severe pill rolling tremor on the right hand .  Romberg testing: unsteady stance with closed eyes.    Assessment:  After physical and neurologic examination, review of laboratory studies,  Personal review of imaging studies, reports of other /same  Imaging studies, results of polysomnography and / or neurophysiology testing and pre-existing records as far as provided in visit., my assessment is:   1) PD 2) neuropathy 3) loss of weight, failure to thrive.   I spent more than 35 minutes of face to face  time with the patient.  Greater than 50% of time was spent in counseling and coordination of care. We have discussed the diagnosis and differential and I answered the patient's questions.    Plan:  Treatment plan and additional workup :  Sinemet 25/100 Mg increased to qid.   DAT scan discussed, not needed at this time.   Rv in 6 month with NP.    Larey Seat, MD 4/86/2824, 1:75 PM  Certified in Neurology by [employer] Certified in [place] by [facility], [facility], [facility] [address] [place], [place] [address]

## 2018-04-20 ENCOUNTER — Ambulatory Visit: Payer: Medicare Other

## 2018-04-20 DIAGNOSIS — R1312 Dysphagia, oropharyngeal phase: Secondary | ICD-10-CM

## 2018-04-20 DIAGNOSIS — R471 Dysarthria and anarthria: Secondary | ICD-10-CM | POA: Diagnosis not present

## 2018-04-20 NOTE — Patient Instructions (Addendum)
   Add these to your other exercises:  SWALLOWING EXERCISES   1. Effortful Swallows - Press your tongue against the roof of your mouth for 3 seconds, then squeeze the muscles in your neck while you swallow your saliva or a sip of water - Repeat 20 times, 2-3 times a day, and use whenever you eat or drink   2. Shaker Exercise - head lift - Lie flat on your back in your bed or on a couch without pillows - Raise your head and look at your feet - KEEP YOUR SHOULDERS DOWN - HOLD FOR 45-60 SECONDS, then lower your head back down - Repeat 3 times, 2-3 times a day  3. Masako Swallow - swallow with your tongue sticking out - Stick tongue out past your teeth and gently bite tongue with your teeth - Swallow, while holding your tongue with your teeth - Repeat 20 times, 2-3 times a day *use a wet spoon if your mouth gets dry*   4. Mendelsohn Maneuver - "half swallow" exercise - Start to swallow, and keep your Adam's apple up by squeezing hard with the muscles of the throat - Hold the squeeze for 5-7 seconds and then relax - Repeat 20 times, 2-3 times a day *use a wet spoon if your mouth gets dry*             5.  Gauze exercise      Unwrap one piece of gauze and put in your fingers      Pull your tongue out with your fingers while you try to pull it back in your mouth      Repeat 15 times, x2-3 /day.

## 2018-04-20 NOTE — Therapy (Signed)
[facility] [address] [place], [place], [address] Phone: [phone]   Fax:  [phone]  Speech Language Pathology Treatment  Patient Details  Name: [name] MRN: [id number] Date of Birth: Jan 19, 1941 Referring Provider (SLP): Dohmeier, Asencion Partridge, MD   Encounter Date: 04/20/2018  End of Session - 04/20/18 1532    Visit Number  3    Number of Visits  17    Date for SLP Re-Evaluation  07/13/18    SLP Start Time  2706    SLP Stop Time   1445    SLP Time Calculation (min)  42 min    Activity Tolerance  Patient tolerated treatment well       Past Medical History:  Diagnosis Date  . Anemia   . Arthritis    "thumbs" (11/29/2013)  . Atrial fibrillation [facility]) 2006   Dr Tonny Bollman af-no meds  . colon ca dx'd 02/2011   COLON CANCER  . Diverticulosis    Sigmoid colon  . Esophageal stricture   . 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 Fracture of distal femur ([place]) 11/29/2013  . GERD (gastroesophageal reflux disease) 03-31-11   tx. Omeprazole  . Hearing loss    has bilateral hearing aids  . Hernia, abdominal    at the umbilicus  . History of hemorrhoids   . History of irregular heartbeat   . Hypercholesterolemia   . Hyperlipidemia   . Neuropathy 2013   as a result of chemo  . OSA on CPAP dx'd 2010   uses cap nightly  . Oxygen deficiency   . Personal history of colonic polyps 09/17/2009   Tubular adenoma  . Pneumonia 01/2013  . 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 Sleep apnea    uses c pap at home  . TBI (traumatic brain injury) ([place]) 1986   S/P fall; "slow to get going q morning" (10//22/2015)    Past Surgical History:  Procedure Laterality Date  . APPENDECTOMY  03/2011  . CARDIAC CATHETERIZATION  07/2002   Dr Wynonia Lawman  . COLON SURGERY  2013  . COLONOSCOPY  sept 2016   upper endo and colonoscopy  .  ESOPHAGEAL DILATION  2006  . ESOPHAGOGASTRODUODENOSCOPY (EGD) WITH PROPOFOL N/A 02/25/2016   Procedure: ESOPHAGOGASTRODUODENOSCOPY (EGD) WITH PROPOFOL;  Surgeon: Manus Gunning, MD;  Location: [facility];  Service: Gastroenterology;  Laterality: N/A;  . ESOPHAGOGASTRODUODENOSCOPY (EGD) WITH PROPOFOL N/A 03/10/2016   Procedure: ESOPHAGOGASTRODUODENOSCOPY (EGD) WITH PROPOFOL;  Surgeon: Manus Gunning, MD;  Location: [facility];  Service: Gastroenterology;  Laterality: N/A;  . GASTRIC VARICES BANDING N/A 03/10/2016   Procedure: GASTRIC VARICES BANDING;  Surgeon: Manus Gunning, MD;  Location: [facility];  Service: Gastroenterology;  Laterality: N/A;  . INGUINAL HERNIA REPAIR Left ~ 1991  . LAPAROSCOPIC PARTIAL COLECTOMY  04/08/2011  . ORIF DISTAL FEMUR FRACTURE Right 11/29/2013   "put plate in"  . ORIF FEMUR FRACTURE Right 11/29/2013   Procedure: OPEN REDUCTION INTERNAL FIXATION (ORIF) RIGHT  DISTAL FEMUR FRACTURE;  Surgeon: Rozanna Box, MD;  Location: [facility];  Service: Orthopedics;  Laterality: Right;  . PORT-A-CATH REMOVAL  11/09/2011   Procedure: REMOVAL PORT-A-CATH;  Surgeon: Odis Hollingshead, MD;  Location: [facility];  Service: General;  Laterality: N/A;  . PORTACATH PLACEMENT  04/30/2011   Procedure: INSERTION PORT-A-CATH;  Surgeon: Odis Hollingshead, MD;  Location: [facility];  Service: General;  Laterality: Right;    There were no vitals filed for this visit.  Subjective Assessment - 04/20/18 1417    Subjective  "Is the hard swallow the best exercise?"    Currently in Pain?  No/denies            ADULT SLP TREATMENT - 04/20/18 1418      General Information   Behavior/Cognition  Alert;Cooperative;Pleasant mood      Treatment Provided   Treatment provided  Dysphagia      Dysphagia Treatment   Temperature Spikes Noted  No    Respiratory Status  Room air    Oral Cavity - Dentition  Adequate natural dentition    Treatment Methods   Therapeutic exercise;Patient/caregiver education    Patient observed directly with PO's  No    Type of cueing  Verbal    Amount of cueing  Modified independent    Other treatment/comments  Pt did not bring RMT devices to his ST today. "The biggest problem is with the pills, pt stated. SLP encouraged pt to use liquid wash as well as putting pills in some kind of puree. Pt performed all exercises on his HEP (effortful swallow, Masako, Mendelsohn, gauze exercise, lingual sweep in sulci) with modified indpendence. Pt did not perform Shaker in the session today. Pt has had little difficulty with his POs.       Assessment / Recommendations / Plan   Plan  Continue with current plan of care      Dysphagia Recommendations   Diet recommendations  --   all same as 04-17-18     Progression Toward Goals   Progression toward goals  Progressing toward goals         SLP Short Term Goals - 04/20/18 1533      SLP SHORT TERM GOAL #1   Title  pt will demo swallow HEP with rare min A over three sessions    Time  4    Period  Weeks    Status  On-going      SLP SHORT TERM GOAL #2   Title  pt will demo swallow precautions with POs over three sessions    Time  4    Period  Weeks    Status  On-going      SLP SHORT TERM GOAL #3   Title  pt will demo expiratory/inspiratory muscle strength HEP with rare min A over three sessions    Time  4    Period  Weeks    Status  On-going      SLP SHORT TERM GOAL #4   Title  pt will produce 17/20 sentences with volume in low 70s dB over three sessions    Time  4    Period  Weeks    Status  On-going       SLP Long Term Goals - 04/20/18 1533      SLP LONG TERM GOAL #1   Title  pt will demo swallowing HEP with modified indepndence over two sessions    Time  8    Period  Weeks   or 17 visits   Status  On-going      SLP LONG TERM GOAL #2   Title  pt will demo respiratory muscle strength training exercises with rare min A over 6 total sessions    Time  8     Period  Weeks    Status  On-going      SLP LONG TERM GOAL #3  Title  pt will maintain average volume of low 70s dB in 10 minutes simple conversation    Time  8    Period  Weeks    Status  On-going      SLP LONG TERM GOAL #4   Title  wife will report she has asked pt less to repeat himself than prior to ST in 4 sessions    Time  8    Period  Weeks    Status  On-going      SLP LONG TERM GOAL #5   Title  pt will demonstrate improved swallow skills on an objective swallow assessment    Time  8    Period  Days    Status  On-going       Plan - 04/20/18 1532    Clinical Impression Statement  Pt presents mild oropharyngeal dysphagia per MBS with decr'd oral bolus propulsion and mild delay with transiting with boluses of incr'd viscosity, mildly reduced tongue base retraction resulting in residuals that were not sensed. Pt also presents today with borderline decr'd speech volumes (upper 60s-low 70s dB) in conversation. Pt did not bring his expiratory and inspiratory devices today. Pt would benefit from skilled ST to incr oromuscular strengthening, improve timing of swallow between oral and pharyngeal stages, train in use of swallow compensations/aspiration precautions, and increase conversational speech volume to consistently WNL.    Speech Therapy Frequency  2x / week    Duration  --   8 weeks, or 17 sessions   Treatment/Interventions  Aspiration precaution training;Pharyngeal strengthening exercises;Diet toleration management by SLP;Internal/external aids;Patient/family education;Compensatory strategies;Functional tasks;SLP instruction and feedback;Environmental controls    Potential to [place]  provided a few exercises today; full HEP to follow    Consulted and Agree with Plan of Care  Patient       Patient will benefit from skilled therapeutic intervention in order to improve the following deficits and impairments:   Dysphagia,  oropharyngeal  Dysarthria and anarthria    Problem List Patient Active Problem List   Diagnosis Date Noted  . Parkinsonism ([place]) 10/24/2017  . Portal hypertension with esophageal varices (HCC) 10/24/2017  . Resting tremor 10/24/2017  . Titubation 10/24/2017  . Cerebellar ataxia in diseases classified elsewhere ([place]) 10/24/2017  . Bleeding esophageal varices ([place])   . Acute GI bleeding   . Shortness of breath at rest   . Upper GI bleed 02/25/2016  . History of colon cancer 02/25/2016  . Thrombocytopenia ([place]) 02/25/2016  . History of esophageal varices   . Maxillary sinusitis, acute 05/06/2015  . Fibrosis of liver   . 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 Fracture of distal femur ([place]) 11/29/2013  . CAP (community acquired pneumonia) 02/12/2013  . Viral URI with cough 01/24/2013  . CAD (coronary artery disease)   . Nephrolithiasis   . Malignant neoplasm of ascending colon ([facility]) 03/03/2011  . 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 Disorder of bilirubin excretion   . Obstructive sleep apnea 12/14/2007  . HYPERGLYCEMIA, FASTING 12/13/2007  . Atrial fibrillation ([facility]) 10/03/2007  . Hyperlipidemia 09/22/2006  . 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 BPH (benign prostatic hypertrophy)     Garald Balding ,MS, CCC-SLP  04/20/2018, 3:33 PM  [facility] [address] [place], [place], [address] Phone: [phone]   Fax:  [phone]   Name: [name] MRN: [id number] Date of Birth: 09/22/1940

## 2018-04-27 ENCOUNTER — Ambulatory Visit: Payer: Medicare Other | Admitting: Speech Pathology

## 2018-05-01 ENCOUNTER — Telehealth: Payer: Self-pay

## 2018-05-01 DIAGNOSIS — D0339 Melanoma in situ of other parts of face: Secondary | ICD-10-CM | POA: Diagnosis not present

## 2018-05-01 DIAGNOSIS — Z85828 Personal history of other malignant neoplasm of skin: Secondary | ICD-10-CM | POA: Diagnosis not present

## 2018-05-01 NOTE — Telephone Encounter (Signed)
Pt was left a message today on his mobile phone regarding the temporary closing of OP Rehab Services due to Covid-19.  Therapist discussed:  Cont to perform his exercises reviewed during ST sessions.  Patient was told to call clinic to inform SLP of his interest in further information for an e-visit, virtual check in, or telehealth visit, if those services become available.    OP Rehabilitation Services will follow up with patients when we are able to resume care.  [facility], [facility] [address] [place], [place]  [address] Phone:  [phone] Fax:  [phone] \

## 2018-05-02 ENCOUNTER — Encounter: Payer: Medicare Other | Admitting: Speech Pathology

## 2018-05-02 DIAGNOSIS — D0339 Melanoma in situ of other parts of face: Secondary | ICD-10-CM | POA: Diagnosis not present

## 2018-05-09 DIAGNOSIS — Z85828 Personal history of other malignant neoplasm of skin: Secondary | ICD-10-CM | POA: Diagnosis not present

## 2018-05-09 DIAGNOSIS — L720 Epidermal cyst: Secondary | ICD-10-CM | POA: Diagnosis not present

## 2018-05-11 ENCOUNTER — Telehealth: Payer: Self-pay

## 2018-05-11 ENCOUNTER — Ambulatory Visit: Payer: Medicare Other

## 2018-05-11 DIAGNOSIS — F329 Major depressive disorder, single episode, unspecified: Secondary | ICD-10-CM | POA: Diagnosis not present

## 2018-05-11 NOTE — Telephone Encounter (Signed)
.  Pt was contacted today regarding temporary reduction of Outpatient Neuro Rehabilitation Services due to concerns for community transmission of COVID-19.  Patient identity was verified.  Assessed if patient needed to be seen in person by clinician (recent fall or acute injury that requires hands on assessment and advice, change in diet order, post-surgical, special cases, etc.).    Patient did not indicate an acute/special need, so SLP proceeded with phone call.  Therapist advised the patient to continue to perform his/her HEP and assured he/she had no unanswered questions or concerns at this time.  The patient expressed interest in being contacted for an E-Visit, virtual check in, or Telehealth visit to continue their plan of care, when those services become available.  Outpatient [facility] Services will follow up with patient at that time.  Patient is aware we can be reached by telephone during limited business hours in the meantime.  [place], [place]

## 2018-06-01 ENCOUNTER — Encounter (HOSPITAL_COMMUNITY): Payer: Medicare Other

## 2018-06-01 ENCOUNTER — Ambulatory Visit (HOSPITAL_COMMUNITY): Payer: Medicare Other

## 2018-06-07 ENCOUNTER — Telehealth: Payer: Medicare Other | Admitting: Cardiology

## 2018-06-15 DIAGNOSIS — I10 Essential (primary) hypertension: Secondary | ICD-10-CM | POA: Diagnosis not present

## 2018-06-15 DIAGNOSIS — E7849 Other hyperlipidemia: Secondary | ICD-10-CM | POA: Diagnosis not present

## 2018-06-15 DIAGNOSIS — D509 Iron deficiency anemia, unspecified: Secondary | ICD-10-CM | POA: Diagnosis not present

## 2018-07-11 ENCOUNTER — Telehealth: Payer: Self-pay | Admitting: Cardiology

## 2018-07-11 NOTE — Telephone Encounter (Signed)
By participating in this telemedicine visit I agree to the above.   Virtual Visit Pre-Appointment Phone Call  "(Name), I am calling you today to discuss your upcoming appointment. We are currently trying to limit exposure to the virus that causes COVID-19 by seeing patients at home rather than in the office."  1. "What is the BEST phone number to call the day of the visit?" - include this in appointment notes  2. Do you have or have access to (through a family member/friend) a smartphone with video capability that we can use for your visit?" a. If yes - list this number in appt notes as cell (if different from BEST phone #) and list the appointment type as a VIDEO visit in appointment notes b. If no - list the appointment type as a PHONE visit in appointment notes  Confirm consent - "In the setting of the current Covid19 crisis, you are scheduled for a (phone or video) visit with your provider on (date) at (time).  Just as we do with many in-office visits, in order for you to participate in this visit, we must obtain consent.  If you'd like, I can send this to your mychart (if signed up) or email for you to review.  Otherwise, I can obtain your verbal consent now.  All virtual visits are billed to your insurance company just like a normal visit would be.  By agreeing to a virtual visit, we'd like you to understand that the technology does not allow for your provider to perform an examination, and thus may limit your provider's ability to fully assess your condition. If your provider identifies any concerns that need to be evaluated in person, we will make arrangements to do so.  Finally, though the technology is pretty good, we cannot assure that it will always work on either your or our end, and in the setting of a video visit, we may have to convert it to a phone-only visit.  In either situation, we cannot ensure that we have a secure connection.  Are you willing to proceed?" STAFF: Did the patient  verbally acknowledge consent to telehealth visit? Document YES/NO here: YES 3. Advise patient to be prepared - "Two hours prior to your appointment, go ahead and check your blood pressure, pulse, oxygen saturation, and your weight (if you have the equipment to check those) and write them all down. When your visit starts, your provider will ask you for this information. If you have an Apple Watch or Kardia device, please plan to have heart rate information ready on the day of your appointment. Please have a pen and paper handy nearby the day of the visit as well."  4. Give patient instructions for MyChart download to smartphone OR Doximity/Doxy.me as below if video visit (depending on what platform provider is using)  5. Inform patient they will receive a phone call 15 minutes prior to their appointment time (may be from unknown caller ID) so they should be prepared to answer    TELEPHONE CALL NOTE  [name] has been deemed a candidate for a follow-up tele-health visit to limit community exposure during the Covid-19 pandemic. I spoke with the patient via phone to ensure availability of phone/video source, confirm preferred email & phone number, and discuss instructions and expectations.  I reminded [name] to be prepared with any vital sign and/or heart rhythm information that could potentially be obtained via home monitoring, at the time of his visit. I reminded [name] to expect a phone call prior to his visit.  Janine Limbo 07/11/2018 1:05 PM   INSTRUCTIONS FOR DOWNLOADING THE MYCHART APP TO SMARTPHONE  - The patient must first make sure to have activated MyChart and know their login information - If Apple, go to [employer] and type in MyChart in the search bar and download the app. If Android, ask patient to go to [employer] and type in [place] in the search bar and download the app. The app is free but as with any other app downloads, their phone may require them  to verify saved payment information or Apple/Android password.  - The patient will need to then log into the app with their MyChart username and password, and select [place] as their healthcare provider to link the account. When it is time for your visit, go to the MyChart app, find appointments, and click Begin Video Visit. Be sure to Select Allow for your device to access the Microphone and Camera for your visit. You will then be connected, and your provider will be with you shortly.  **If they have any issues connecting, or need assistance please contact MyChart service desk [phone])**  **If using a computer, in order to ensure the best quality for their visit they will need to use either of the following Internet Browsers: [employer], or Google Chrome**  IF USING DOXIMITY or DOXY.ME - The patient will receive a link just prior to their visit by text.     FULL LENGTH CONSENT FOR TELE-HEALTH VISIT   I hereby voluntarily request, consent and authorize [facility] and its employed or contracted physicians, physician assistants, nurse practitioners or other licensed health care professionals (the Practitioner), to provide me with telemedicine health care services (the Services") as deemed necessary by the treating Practitioner. I acknowledge and consent to receive the Services by the Practitioner via telemedicine. I understand that the telemedicine visit will involve communicating with the Practitioner through live audiovisual communication technology and the disclosure of certain medical information by electronic transmission. I acknowledge that I have been given the opportunity to request an in-person assessment or other available alternative prior to the telemedicine visit and am voluntarily participating in the telemedicine visit.  I understand that I have the right to withhold or withdraw my consent to the use of telemedicine in the course of my care at any time,  without affecting my right to future care or treatment, and that the Practitioner or I may terminate the telemedicine visit at any time. I understand that I have the right to inspect all information obtained and/or recorded in the course of the telemedicine visit and may receive copies of available information for a reasonable fee.  I understand that some of the potential risks of receiving the Services via telemedicine include:   Delay or interruption in medical evaluation due to technological equipment failure or disruption;  Information transmitted may not be sufficient (e.g. poor resolution of images) to allow for appropriate medical decision making by the Practitioner; and/or   In rare instances, security protocols could fail, causing a breach of personal health information.  Furthermore, I acknowledge that it is my responsibility to provide information about my medical history, conditions and care that is complete and accurate to the best of my ability. I acknowledge that Practitioner's advice, recommendations, and/or decision may be based on factors not within their control, such as incomplete or inaccurate data provided by me or distortions of diagnostic images or  specimens that may result from electronic transmissions. I understand that the practice of medicine is not an exact science and that Practitioner makes no warranties or guarantees regarding treatment outcomes. I acknowledge that I will receive a copy of this consent concurrently upon execution via email to the email address I last provided but may also request a printed copy by calling the office of [facility].    I understand that my insurance will be billed for this visit.   I have read or had this consent read to me.  I understand the contents of this consent, which adequately explains the benefits and risks of the Services being provided via telemedicine.   I have been provided ample opportunity to ask questions regarding  this consent and the Services and have had my questions answered to my satisfaction.  I give my informed consent for the services to be provided through the use of telemedicine in my medical care  By participating in this telemedicine visit I agree to the above.

## 2018-07-14 DIAGNOSIS — I8500 Esophageal varices without bleeding: Secondary | ICD-10-CM | POA: Diagnosis not present

## 2018-07-14 DIAGNOSIS — K7460 Unspecified cirrhosis of liver: Secondary | ICD-10-CM | POA: Diagnosis not present

## 2018-07-17 DIAGNOSIS — I359 Nonrheumatic aortic valve disorder, unspecified: Secondary | ICD-10-CM | POA: Insufficient documentation

## 2018-07-17 HISTORY — DX: Nonrheumatic aortic valve disorder, unspecified: I35.9

## 2018-07-17 NOTE — Progress Notes (Signed)
Virtual Visit via Video Note   This visit type was conducted due to national recommendations for restrictions regarding the COVID-19 Pandemic (e.g. social distancing) in an effort to limit this patient's exposure and mitigate transmission in our community.  Due to his co-morbid illnesses, this patient is at least at moderate risk for complications without adequate follow up.  This format is felt to be most appropriate for this patient at this time.  All issues noted in this document were discussed and addressed.  A limited physical exam was performed with this format.  Please refer to the patient's chart for his consent to telehealth for [facility].   Date:  07/18/2018   ID:  [name], DOB 05/29/1940, MRN [id number]  Patient Location: Home Provider Location: Office  PCP:  Burnard Bunting, MD  Cardiologist:  Shirlee More, MD  Electrophysiologist:  None   Evaluation Performed:  Follow-Up Visit  Chief Complaint:  FU for CAD  History of Present Illness:    [name] is a 78 y.o. male with CAD hyperlipidemia , PAF  and chronic venous insufficiency last seen 05/17/17  The patient does not have symptoms concerning for COVID-19 infection (fever, chills, cough, or new shortness of breath).   He has had no angina shortness of breath or syncope is having 2 problems 1 he suspects he is back in atrial fibrillation with palpitation and weakness intermittently in the second is peripheral edema has been seen by GI with his cirrhosis and is waiting to pick up a prescription for diuretic from the pharmacy.  He does not have a history of heart failure he is not on aspirin because of cirrhosis portal hypertension and GI  bleed and because of liver disease his statin was stopped a year ago.  We discussed alternate therapy Zetia or PCSK9 and at this point he prefers not to take lipid-lowering treatment.  Past Medical History:  Diagnosis Date  . Anemia   . Aortic valve disorder 07/17/2018  .  Arthritis    "thumbs" (11/29/2013)  . Atrial fibrillation [facility]) 2006   Dr Tonny Bollman af-no meds  . Bleeding esophageal varices ([place])   . BPH (benign prostatic hypertrophy)   . CAD (coronary artery disease)    Mild by Cath 2004. Calcification with mild irregularity.  30% diagonal.  Cardiolite 8/13 normal EF and negative for ischemia (chronically positive ETT)   . CAP (community acquired pneumonia) 02/12/2013  . Cerebellar ataxia in diseases classified elsewhere ([place]) 10/24/2017  . Disorder of bilirubin excretion   . Diverticulosis    Sigmoid colon  . 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 Esophageal stricture   . Esophageal varices determined by endoscopy ([place]) sept 2016  . Fibrosis of liver   . Fracture of distal femur ([place]) 11/29/2013  . GERD (gastroesophageal reflux disease) 03-31-11   tx. Omeprazole  . 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 Hernia, abdominal    at the umbilicus  . History of colon cancer 02/25/2016  . History of esophageal varices   . History of hemorrhoids   . History of irregular heartbeat   . Hypercholesterolemia   . HYPERGLYCEMIA, FASTING 12/13/2007  . Hyperlipidemia   . Malignant neoplasm of ascending colon ([place]) 03/03/2011   s/p right colectomy 04/08/11 Dr. Barkley Bruns Genetic counseling recommendations:  SURVEILLANCE:  Because of the increased risk for cancer in you and your family which we cannot yet define through genetic testing, we urge you and family members to pursue screenings that are recommended  for individuals at increased risk of Lynch syndrome (HNPCC)-associated cancers. The following measures are recommend  . Maxillary sinusitis, acute 05/06/2015  . 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 Nephrolithiasis   . Neuropathy 2013   as a result of chemo  . Obstructive sleep apnea 12/14/2007  . OSA on CPAP dx'd 2010   uses cap nightly  . Oxygen deficiency   . Parkinsonism ([place]) 10/24/2017  .  Personal history of colonic polyps 09/17/2009   Tubular adenoma  . Pneumonia 01/2013  . 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 Portal hypertension with esophageal varices ([place]) 10/24/2017  . Resting tremor 10/24/2017  . Shortness of breath at rest   . Sleep apnea    uses c pap at home  . TBI (traumatic brain injury) ([place]) 1986   S/P fall; "slow to get going q morning" (10//22/2015)  . Thrombocytopenia ([place]) 02/25/2016  . Titubation 10/24/2017  . Upper GI bleed 02/25/2016  . Viral URI with cough 01/24/2013   Past Surgical History:  Procedure Laterality Date  . APPENDECTOMY  03/2011  . CARDIAC CATHETERIZATION  07/2002   Dr Wynonia Lawman  . COLON SURGERY  2013  . COLONOSCOPY  sept 2016   upper endo and colonoscopy  . ESOPHAGEAL DILATION  2006  . ESOPHAGOGASTRODUODENOSCOPY (EGD) WITH PROPOFOL N/A 02/25/2016   Procedure: ESOPHAGOGASTRODUODENOSCOPY (EGD) WITH PROPOFOL;  Surgeon: Manus Gunning, MD;  Location: [facility];  Service: Gastroenterology;  Laterality: N/A;  . ESOPHAGOGASTRODUODENOSCOPY (EGD) WITH PROPOFOL N/A 03/10/2016   Procedure: ESOPHAGOGASTRODUODENOSCOPY (EGD) WITH PROPOFOL;  Surgeon: Manus Gunning, MD;  Location: [facility];  Service: Gastroenterology;  Laterality: N/A;  . GASTRIC VARICES BANDING N/A 03/10/2016   Procedure: GASTRIC VARICES BANDING;  Surgeon: Manus Gunning, MD;  Location: [facility];  Service: Gastroenterology;  Laterality: N/A;  . INGUINAL HERNIA REPAIR Left ~ 1991  . LAPAROSCOPIC PARTIAL COLECTOMY  04/08/2011  . ORIF DISTAL FEMUR FRACTURE Right 11/29/2013   "put plate in"  . ORIF FEMUR FRACTURE Right 11/29/2013   Procedure: OPEN REDUCTION INTERNAL FIXATION (ORIF) RIGHT  DISTAL FEMUR FRACTURE;  Surgeon: Rozanna Box, MD;  Location: [place];  Service: Orthopedics;  Laterality: Right;  . PORT-A-CATH REMOVAL  11/09/2011   Procedure: REMOVAL PORT-A-CATH;  Surgeon:  Odis Hollingshead, MD;  Location: [place];  Service: General;  Laterality: N/A;  . PORTACATH PLACEMENT  04/30/2011   Procedure: INSERTION PORT-A-CATH;  Surgeon: Odis Hollingshead, MD;  Location: [place];  Service: General;  Laterality: Right;     Current Meds  Medication Sig  . amphetamine-dextroamphetamine (ADDERALL) 30 MG tablet Take 10-20 mg by mouth daily with breakfast.   . carbidopa-levodopa (SINEMET IR) 25-100 MG tablet Take 1 tablet by mouth 4 (four) times daily.  . ferrous sulfate 325 (65 FE) MG tablet Take 325 mg by mouth 2 (two) times daily.  . pantoprazole (PROTONIX) 40 MG tablet Take 1 tablet (40 mg total) by mouth 2 (two) times daily.  . propranolol (INDERAL) 20 MG tablet Take 1 tablet (20 mg total) by mouth 2 (two) times daily.     Allergies:   Patient has no known allergies.   Social History   Tobacco Use  . Smoking status: Former Smoker    Packs/day: 0.50    Years: 7.00    Pack years: 3.50    Types: Cigarettes    Last attempt to quit:  03/21/1961    Years since quitting: 57.3  . Smokeless tobacco: Never Used  Substance Use Topics  . Alcohol use: No    Alcohol/week: 0.0 standard drinks  . Drug use: No     Family Hx: The patient's family history includes Cancer in his brother; Colon cancer in his sister and another family member; Colon cancer (age of onset: 25) in his brother; Coronary artery disease in his brother; Diabetes in his father; Heart attack (age of onset: 6) in his father; Heart failure in his mother; Lung cancer in his brother; Prostate cancer in his father; Stomach cancer in his sister. There is no history of Anesthesia problems or Esophageal cancer.  ROS:   Please see the history of present illness.     All other systems reviewed and are negative.   Prior CV studies:   The following studies were reviewed today:    Labs/Other Tests and Data Reviewed:    EKG:  An ECG dated 05/17/2017 was personally reviewed today and  demonstrated:  [place] normal EKG  Recent Labs:  06/16/2018: TSH normal 1.43 Hemoglobin normal 13.0 platelets diminished 105,000 Creatinine 1.0 Cholesterol 143 HDL 44 LDL 86 10/24/2017: ALT 23; BUN 16; Creatinine, Ser 1.02; Potassium 4.2; Sodium 140 02/21/2018: Hemoglobin 14.3; Platelet Count 105   Recent Lipid Panel Lab Results  Component Value Date/Time   CHOL 127 04/10/2014 08:25 AM   TRIG 71.0 04/10/2014 08:25 AM   HDL 46.20 04/10/2014 08:25 AM   CHOLHDL 3 04/10/2014 08:25 AM   LDLCALC 67 04/10/2014 08:25 AM    Wt Readings from Last 3 Encounters:  07/18/18 170 lb (77.1 kg)  04/19/18 177 lb (80.3 kg)  12/13/17 185 lb (83.9 kg)     Objective:    Vital Signs:  BP 119/65 (BP Location: Right Arm, Patient Position: Sitting)   Pulse 85   Ht 6\' 2"  (1.88 m)   Wt 170 lb (77.1 kg)   BMI 21.83 kg/m    VITAL SIGNS:  reviewed GEN:  no acute distress EYES:  sclerae anicteric, EOMI - Extraocular Movements Intact RESPIRATORY:  normal respiratory effort, symmetric expansion CARDIOVASCULAR:  no peripheral edema SKIN:  no rash, lesions or ulcers. MUSCULOSKELETAL:  no obvious deformities. NEURO:  alert and oriented x 3, no obvious focal deficit PSYCH:  normal affect  ASSESSMENT & PLAN:    1. Coronary artery disease stable New York Heart Association class I he is not on aspirin because of cirrhosis and GI bleed not on a statin because of liver disease he is asymptomatic and at this time I would just continue the beta-blocker he takes for portal hypertension and would not do an ischemia evaluation. 2. Hyperlipidemia his lipids are reasonable without lipid-lowering treatment at this time he declines non-statin treatment 3. Atrial fibrillation concerned he is having clinical recurrence we will mail him a 1 week ambulatory heart rhythm monitor and reassess 4. Mild aortic regurgitation stable 5. Edema related to his chronic liver disease I asked him to touch base with GI and resolve any  issues about antibiotics through them.  COVID-19 Education: The signs and symptoms of COVID-19 were discussed with the patient and how to seek care for testing (follow up with PCP or arrange E-visit).  The importance of social distancing was discussed today.  Time:   Today, I have spent 27 minutes with the patient with telehealth technology discussing the above problems.   Extra time was spent to the patient with a complicated issues involving his liver disease  contraindication to antiplatelet therapy options for lipid-lowering and discussion about his edema in the setting of liver disease.  He is likely has recurrent atrial fibrillation and will use an ambulatory rhythm monitor to document.  If present I would not anticoagulate  Medication Adjustments/Labs and Tests Ordered: Current medicines are reviewed at length with the patient today.  Concerns regarding medicines are outlined above.   Tests Ordered: No orders of the defined types were placed in this encounter.   Medication Changes: No orders of the defined types were placed in this encounter.   Disposition:  Follow up in 6 month(s)  Signed, Shirlee More, MD  07/18/2018 11:16 AM    [place]

## 2018-07-18 ENCOUNTER — Encounter: Payer: Self-pay | Admitting: Cardiology

## 2018-07-18 ENCOUNTER — Telehealth (INDEPENDENT_AMBULATORY_CARE_PROVIDER_SITE_OTHER): Payer: Medicare Other | Admitting: Cardiology

## 2018-07-18 VITALS — BP 119/65 | HR 85 | Ht 74.0 in | Wt 170.0 lb

## 2018-07-18 DIAGNOSIS — I351 Nonrheumatic aortic (valve) insufficiency: Secondary | ICD-10-CM

## 2018-07-18 DIAGNOSIS — E782 Mixed hyperlipidemia: Secondary | ICD-10-CM

## 2018-07-18 DIAGNOSIS — I25118 Atherosclerotic heart disease of native coronary artery with other forms of angina pectoris: Secondary | ICD-10-CM | POA: Diagnosis not present

## 2018-07-18 DIAGNOSIS — R600 Localized edema: Secondary | ICD-10-CM

## 2018-07-18 DIAGNOSIS — I480 Paroxysmal atrial fibrillation: Secondary | ICD-10-CM

## 2018-07-18 NOTE — Patient Instructions (Signed)
Medication Instructions:  Your physician recommends that you continue on your current medications as directed. Please refer to the Current Medication list given to you today.  If you need a refill on your cardiac medications before your next appointment, please call your pharmacy.   Lab work: None If you have labs (blood work) drawn today and your tests are completely normal, you will receive your results only by: Marland Kitchen MyChart Message (if you have MyChart) OR . A paper copy in the mail If you have any lab test that is abnormal or we need to change your treatment, we will call you to review the results.  Testing/Procedures: Your physician has recommended that you wear a ZIO monitor. ZIO monitors are medical devices that record the heart's electrical activity. Doctors most often use these monitors to diagnose arrhythmias. Arrhythmias are problems with the speed or rhythm of the heartbeat. The monitor is a small, portable device. You can wear one while you do your normal daily activities. This is usually used to diagnose what is causing palpitations/syncope (passing out).  WEAR 7 DAys  Follow-Up: At [facility], you and your health needs are our priority.  As part of our continuing mission to provide you with exceptional heart care, we have created designated Provider Care Teams.  These Care Teams include your primary Cardiologist (physician) and Advanced Practice Providers (APPs -  Physician Assistants and Nurse Practitioners) who all work together to provide you with the care you need, when you need it. You will need a follow up appointment in 6 months. Any Other Special Instructions Will Be Listed Below (If Applicable).

## 2018-07-18 NOTE — Addendum Note (Signed)
Addended by: Particia Nearing B on: 07/18/2018 11:52 AM   Modules accepted: Orders

## 2018-07-25 DIAGNOSIS — I8500 Esophageal varices without bleeding: Secondary | ICD-10-CM | POA: Diagnosis not present

## 2018-07-26 ENCOUNTER — Other Ambulatory Visit: Payer: Self-pay

## 2018-07-26 ENCOUNTER — Encounter (HOSPITAL_COMMUNITY)
Admission: RE | Admit: 2018-07-26 | Discharge: 2018-07-26 | Disposition: A | Discharge status: Home / Self Care | Payer: Medicare Other | Source: Ambulatory Visit | Attending: Neurology | Admitting: Neurology

## 2018-07-26 ENCOUNTER — Ambulatory Visit (HOSPITAL_COMMUNITY)
Admission: RE | Admit: 2018-07-26 | Discharge: 2018-07-26 | Disposition: A | Discharge status: Home / Self Care | Payer: Medicare Other | Source: Ambulatory Visit | Attending: Neurology | Admitting: Neurology

## 2018-07-26 DIAGNOSIS — R2689 Other abnormalities of gait and mobility: Secondary | ICD-10-CM | POA: Diagnosis not present

## 2018-07-26 DIAGNOSIS — C182 Malignant neoplasm of ascending colon: Secondary | ICD-10-CM | POA: Diagnosis not present

## 2018-07-26 DIAGNOSIS — G20 Parkinson's disease: Secondary | ICD-10-CM | POA: Insufficient documentation

## 2018-07-26 DIAGNOSIS — I2510 Atherosclerotic heart disease of native coronary artery without angina pectoris: Secondary | ICD-10-CM | POA: Insufficient documentation

## 2018-07-26 DIAGNOSIS — I4819 Other persistent atrial fibrillation: Secondary | ICD-10-CM | POA: Diagnosis not present

## 2018-07-26 DIAGNOSIS — M62838 Other muscle spasm: Secondary | ICD-10-CM | POA: Diagnosis not present

## 2018-07-26 DIAGNOSIS — R251 Tremor, unspecified: Secondary | ICD-10-CM | POA: Diagnosis not present

## 2018-07-26 DIAGNOSIS — G4733 Obstructive sleep apnea (adult) (pediatric): Secondary | ICD-10-CM | POA: Diagnosis not present

## 2018-07-26 MED ORDER — IOFLUPANE I 123 185 MBQ/2.5ML IV SOLN
4.7000 | Freq: Once | INTRAVENOUS | Status: AC | PRN
  Administered 2018-07-26: 4.7 via INTRAVENOUS
  Filled 2018-07-26: qty 5

## 2018-07-26 MED ORDER — IODINE STRONG (LUGOLS) 5 % PO SOLN
0.8000 mL | Freq: Once | ORAL | Status: AC
  Administered 2018-07-26: 0.8 mL via ORAL

## 2018-07-27 DIAGNOSIS — K8020 Calculus of gallbladder without cholecystitis without obstruction: Secondary | ICD-10-CM | POA: Diagnosis not present

## 2018-07-27 DIAGNOSIS — K7460 Unspecified cirrhosis of liver: Secondary | ICD-10-CM | POA: Diagnosis not present

## 2018-07-27 DIAGNOSIS — I8500 Esophageal varices without bleeding: Secondary | ICD-10-CM | POA: Diagnosis not present

## 2018-07-31 ENCOUNTER — Encounter: Payer: Self-pay | Admitting: Neurology

## 2018-08-01 ENCOUNTER — Telehealth: Payer: Self-pay | Admitting: Neurology

## 2018-08-01 NOTE — Telephone Encounter (Signed)
Called the patient to make him aware of the DAT scan results. There was no answer. LVM informing I was calling with results. Informed the patient I sent a message to him on mychart with results. Advised the pt to call back to discuss further or if he has questions and if not we will discuss more in detail at the upcoming apt in July.

## 2018-08-01 NOTE — Telephone Encounter (Signed)
-----   Message from Larey Seat, MD sent at 07/27/2018 10:34 AM EDT ----- Dx of Parkinson's disease,  indicated by distribution of tracer material.

## 2018-08-16 ENCOUNTER — Encounter: Payer: Self-pay | Admitting: Neurology

## 2018-08-16 ENCOUNTER — Other Ambulatory Visit: Payer: Self-pay

## 2018-08-16 ENCOUNTER — Ambulatory Visit: Payer: Medicare Other | Admitting: Neurology

## 2018-08-16 VITALS — BP 116/65 | HR 89 | Temp 98.2°F | Ht 74.0 in | Wt 170.0 lb

## 2018-08-16 DIAGNOSIS — G3281 Cerebellar ataxia in diseases classified elsewhere: Secondary | ICD-10-CM

## 2018-08-16 DIAGNOSIS — D696 Thrombocytopenia, unspecified: Secondary | ICD-10-CM

## 2018-08-16 DIAGNOSIS — M342 Systemic sclerosis induced by drug and chemical: Secondary | ICD-10-CM | POA: Diagnosis not present

## 2018-08-16 DIAGNOSIS — T451X5A Adverse effect of antineoplastic and immunosuppressive drugs, initial encounter: Secondary | ICD-10-CM | POA: Insufficient documentation

## 2018-08-16 DIAGNOSIS — D701 Agranulocytosis secondary to cancer chemotherapy: Secondary | ICD-10-CM | POA: Diagnosis not present

## 2018-08-16 DIAGNOSIS — Z9989 Dependence on other enabling machines and devices: Secondary | ICD-10-CM

## 2018-08-16 DIAGNOSIS — R634 Abnormal weight loss: Secondary | ICD-10-CM

## 2018-08-16 DIAGNOSIS — G20 Parkinson's disease: Secondary | ICD-10-CM

## 2018-08-16 DIAGNOSIS — G4733 Obstructive sleep apnea (adult) (pediatric): Secondary | ICD-10-CM

## 2018-08-16 NOTE — Patient Instructions (Signed)
Carbidopa; Levodopa tablets What is this medicine? CARBIDOPA;LEVODOPA (kar bi DOE pa; lee voe DOE pa) is used to treat the symptoms of Parkinson's disease. This medicine may be used for other purposes; ask your health care provider or pharmacist if you have questions. COMMON BRAND NAME(S): Atamet, SINEMET What should I tell my health care provider before I take this medicine? They need to know if you have any of these conditions:  asthma or lung disease  depression or other mental illness  diabetes  glaucoma  heart disease, including history of a heart attack  irregular heart beat  kidney or liver disease  melanoma or suspicious skin lesions  stomach or intestine ulcers  an unusual or allergic reaction to levodopa, carbidopa, other medicines, foods, dyes, or preservatives  pregnant or trying to get pregnant  breast-feeding How should I use this medicine? Take this medicine by mouth with a glass of water. Follow the directions on the prescription label. Take your doses at regular intervals. Do not take your medicine more often than directed. Do not stop taking except on the advice of your doctor or health care professional. Talk to your pediatrician regarding the use of this medicine in children. Special care may be needed. Overdosage: If you think you have taken too much of this medicine contact a poison control center or emergency room at once. NOTE: This medicine is only for you. Do not share this medicine with others. What if I miss a dose? If you miss a dose, take it as soon as you can. If it is almost time for your next dose, take only that dose. Do not take double or extra doses. What may interact with this medicine? Do not take this medicine with any of the following medications:  MAOIs like Marplan, Nardil, and Parnate  reserpine  tetrabenazine This medicine may also interact with the following medications:  alcohol  droperidol  entacapone  iron  supplements or multivitamins with iron  isoniazid, INH  linezolid  medicines for depression, anxiety, or psychotic disturbances  medicines for high blood pressure  medicines for sleep  metoclopramide  papaverine  procarbazine  tedizolid  rasagiline  selegiline  tolcapone This list may not describe all possible interactions. Give your health care provider a list of all the medicines, herbs, non-prescription drugs, or dietary supplements you use. Also tell them if you smoke, drink alcohol, or use illegal drugs. Some items may interact with your medicine. What should I watch for while using this medicine? Visit your doctor or health care professional for regular checks on your progress. It may be several weeks or months before you feel the full benefits of this medicine. Continue to take your medicine on a regular schedule. Do not take any additional medicines for Parkinson's disease without first consulting with your health care provider. You may experience a wearing of effect prior to the time for your next dose of this medicine. You may also experience an on-off effect where the medicine apparently stops working for anything from a minute to several hours, then suddenly starts working again. Tell your doctor or health care professional if any of these symptoms happen to you. Your dose may need to be changed. A high protein diet can slow or prevent absorption of this medicine. Avoid high protein foods near the time of taking this medicine to help to prevent these problems. Take this medicine at least 30 minutes before eating or one hour after meals. You may want to eat higher protein foods  later in the day or in small amounts. Discuss your diet with your doctor or health care professional or nutritionist. You may get drowsy or dizzy. Do not drive, use machinery, or do anything that needs mental alertness until you know how this drug affects you. Do not stand or sit up quickly, especially  if you are an older patient. This reduces the risk of dizzy or fainting spells. Alcohol can make you more drowsy and dizzy. Avoid alcoholic drinks. If you find that you have sudden feelings of wanting to sleep during normal activities, like cooking, watching television, or while driving or riding in a car, you should contact your health care professional. If you are diabetic, this medicine may interfere with the accuracy of some tests for sugar or ketones in the urine (does not interfere with blood tests). Check with your doctor or health care professional before changing the dose of your diabetic medicine. This medicine may discolor the urine or sweat, making it look darker or red in color. This is of no cause for concern. However, this may stain clothing or fabrics. There have been reports of increased sexual urges or other strong urges such as gambling while taking some medicines for Parkinson's disease. If you experience any of these urges while taking this medicine, you should report it to your health care provider as soon as possible. You should check your skin often for changes to moles and new growths while taking this medicine. Call your doctor if you notice any of these changes. This medicine may cause a decrease in vitamin B6. You should make sure that you get enough vitamin B6 while you are taking this medicine. Discuss the foods you eat and the vitamins you take with your health care professional. What side effects may I notice from receiving this medicine? Side effects that you should report to your doctor or health care professional as soon as possible:  allergic reactions like skin rash, itching or hives, swelling of the face, lips, or tongue  anxiety, confusion, or nervousness  falling asleep during normal activities like driving  fast, irregular heartbeat  hallucination, loss of contact with reality  mood changes like aggressive behavior, depression  stomach pain  trouble  passing urine  uncontrolled movements of the mouth, head, hands, feet, shoulders, eyelids or other unusual muscle movements Side effects that usually do not require medical attention (report to your doctor or health care professional if they continue or are bothersome):  headache  loss of appetite  muscle twitches  nausea, vomiting  nightmares, trouble sleeping  unusually weak or tired This list may not describe all possible side effects. Call your doctor for medical advice about side effects. You may report side effects to FDA at 1-800-FDA-1088. Where should I keep my medicine? Keep out of the reach of children. Store at room temperature between 15 and 30 degrees C (59 and 86 degrees F). Protect from light. Throw away any unused medicine after the expiration date. NOTE: This sheet is a summary. It may not cover all possible information. If you have questions about this medicine, talk to your doctor, pharmacist, or health care provider.  2020 Elsevier/[employer] Standard (2016-09-03 08:21:48)   Parkinson's Disease Parkinson's disease is a type of movement disorder. It is a long-term condition that gets worse over time (is progressive). Each person with Parkinson's disease is affected differently. This condition limits your ability to control movements and move your body normally. The condition can range from mild to severe. Parkinson's disease  tends to get worse slowly over several years. What are the causes? Parkinson's disease results from a loss of brain cells (neurons) that make a brain chemical called dopamine. Dopamine is needed to control movement. As the condition gets worse, neurons make less dopamine. This makes it hard to move or control your movements. The exact cause of the loss of neurons and why they make less dopamine is not known. Factors related to genes and the environment may contribute to the cause of Parkinson's disease. What increases the risk? The following factors  may make you more likely to develop this condition:  Being male.  Being age 73 or older.  Having a family history of Parkinson's disease.  Having had a traumatic brain injury.  Having experienced depression.  Having been exposed to toxins, such as pesticides. What are the signs or symptoms? Symptoms of this condition can vary. The main symptoms are related to movement. These include:  A tremor or shaking while you are resting that you cannot control.  Stiffness in your neck, arms, and legs (rigidity).  Slowing of movement. You may lose facial expressions and have trouble making small movements that are needed to button clothing or brush your teeth.  An abnormal walk. You may walk with short, shuffling steps.  Loss of balance and stability when standing. You may sway, fall backward, and have trouble making turns. Other symptoms include:  Mental or cognitive changes including depression, anxiety, having false beliefs (delusions), or seeing, hearing, or feeling things that do not exist (hallucinations).  Trouble speaking or swallowing.  Changes in bowel or bladder functions including constipation, having to go urgently or frequently, or not being able to control your bowel or bladder.  Changes in sleep habits or trouble sleeping. Parkinson's disease may be graded by severity of your condition as mild, moderate, or advanced. Parkinson's disease progression is different for everyone. You may not progress to the advanced stage.  Mild Parkinson's disease involves: ? Movement problems that do not affect daily activities. ? Movement problems on one side of the body.  Moderate Parkinson's disease involves: ? Movement problems on both sides of the body. ? Slowing of movement. ? Coordination and balance problems.  Advanced Parkinson's disease involves: ? Extreme difficulty walking. ? Inability to live alone safely. ? Signs of dementia, such as having trouble remembering things,  doing daily tasks such as getting dressed, and problem solving. How is this diagnosed? This condition is diagnosed by a specialist. A diagnosis may be made based on symptoms, your medical history, and a physical exam. You may also have brain imaging tests to check for a loss of dopamine-producing areas of the brain. How is this treated? There is no cure for Parkinson's disease. Treatment focuses on managing your symptoms. Treatment may include:  Medicines. Everyone responds to medicines differently. Your response may change over time. Work with your health care provider to find the best medicines for you.  Speech, occupational, and physical therapy.  Deep brain stimulation surgery to reduce tremors and other involuntary movements. Follow these instructions at home: Medicines  Take over-the-counter and prescription medicines only as told by your health care provider.  Avoid taking medicines that can affect thinking, such as pain or sleeping medicines. Eating and drinking  Follow instructions from your health care provider about eating or drinking restrictions.  Do not drink alcohol. Activity  Talk with your health care provider about if it is safe for you to drive.  Do exercises as told by your  health care provider or physical therapist. Lifestyle      Install grab bars and railings in your home to prevent falls.  Do not use any products that contain nicotine or tobacco, such as cigarettes, e-cigarettes, and chewing tobacco. If you need help quitting, ask your health care provider.  Consider joining a support group for people with Parkinson's disease. General instructions  Work with your health care provider to determine what you need help with and what your safety needs are.  Keep all follow-up visits as told by your health care provider, including any visits with a physical therapist, speech therapist, or occupational therapist. This is important. Contact a health care  provider if:  Medicines do not help your symptoms.  You are unsteady or have fallen at home.  You need more support to function well at home.  You have trouble swallowing.  You have severe constipation.  You are having problems with side effects from your medicines.  You feel confused, anxious, or depressed. Get help right away if you:  Are injured after a fall.  See or hear things that are not real.  Cannot swallow without choking.  Have chest pain or trouble breathing.  Do not feel safe at home.  Have thoughts about hurting yourself or others. If you ever feel like you may hurt yourself or others, or have thoughts about taking your own life, get help right away. You can go to your nearest emergency department or call:  Your local emergency services (911 in the [place]  A suicide crisis helpline, such as the [facility] at 6466815469. This is open 24 hours a day. Summary  Parkinson's disease is a long-term condition that gets worse over time. This condition limits your ability to control your movements and move your body normally.  There is no cure for Parkinson's disease. Treatment focuses on managing your symptoms.  Work with your health care provider to determine what you need help with and what your safety needs are.  Keep all follow-up visits as told by your health care provider, including any visits with a physical therapist, speech therapist, or occupational therapist. This is important. This information is not intended to replace advice given to you by your health care provider. Make sure you discuss any questions you have with your health care provider. Document Released: 01/23/2000 Document Revised: 04/13/2018 Document Reviewed: 04/13/2018 Elsevier Patient Education  2020 [employer].

## 2018-08-16 NOTE — Progress Notes (Signed)
SLEEP MEDICINE CLINIC   Provider:  Larey Seat, M D  Primary Care Physician:  Burnard Bunting, MD Referring Provider: Burnard Bunting, MD    Chief Complaint  Patient presents with  . Follow-up    pt alone, rm  11. pt presents today to follow up post DAT scan. only concern he has is concerns of weight loss that is occuring.     Interval history 08-16-2018, Mr Power's DAT scan is to be discussed, it has confirmed Parkinson's Disease.  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   He is self isolating , also out of concern for his wife. He reports tolerating the qid regimen of Sinemet. He reports having trouble closing buttons, put the seatbelt in and typing. He feels he lost his fie touch sense. His wife had noted him to become more repetitive, too.  He has been a cancer survivor and is still losing weight. Question if this is a cancer side effect, a chemotherapy effect or PD . His knees buckle. He feels stiff and rigid.   GI involved in weight loss evaluation but he reports he is eating much slower, more careful and takes in overall less calories. His family cleared the table on 4th of July and he was still eating for a long time.    HPI:  RV 04-19-2018. Mr. and Mrs. Doutt had a busy 3 month, Mrs. Slaymaker suffered a stroke. Here is our last visit plan: 1) findings of asymmetric  tremor in dominant hand and arm, arm swing loss, masked face, dysphonia and rare blinking. He is stooped. This fits Parkinson's Disease.  2) He broke his right leg and stated he hasn't walked well again- this may account for the "flat footed " gait.  3) stooped posture - that's why we ordered MRI cervical spine, too.  4) atrial fibrillation, may have vascular component to PD.- this was not found.   He underwent swallowing examination , ST -  "Bedside" Swallow Evaluation - 04/14/18 1003            General Information   Type of Study  Bedside  Swallow Evaluation    Diet Prior to this Study  Regular    Temperature Spikes Noted  No    Respiratory Status  Room air    History of Recent Intubation  No    Behavior/Cognition  Alert;Cooperative;Pleasant mood    Oral Cavity Assessment  Dry    Oral Care Completed by SLP  No    Self-Feeding Abilities  Able to feed self    Patient Positioning  Upright in chair    Baseline Vocal Quality  Low vocal intensity    Volitional Cough  Strong    Volitional Swallow  Able to elicit          Thin Liquid - 04/14/18 1004            Thin Liquid   Thin Liquid  Impaired    Presentation  Cup    Pharyngeal  Phase Impairments  --          Solid - 04/14/18 1006            Solid   Solid  Impaired    Oral Phase Impairments  Reduced lingual movement/coordination    Oral Phase Functional Implications  --    Other Comments  no pharyngeal difficulties observed        SLP Education -  04/14/18 1001    Education Details  Shaker, Effortful swallow, lingual ROM in sulci, precautions, results of modified, "hock" *and reswallow*, chin tuck not necessary,    Person(s) Educated  Patient;Spouse    Methods  Explanation;Demonstration;Verbal cues    Comprehension  Verbalized understanding;Returned demonstration;Verbal cues required;Need further instruction           SLP Short Term Goals - 04/14/18 1352            SLP SHORT TERM GOAL #1   Title  pt will demo swallow HEP with rare min A over three sessions    Time  4    Period  Weeks    Status  New        SLP SHORT TERM GOAL #2   Title  pt will demo swallow precautions with POs over three sessions    Time  4    Period  Weeks    Status  New        SLP SHORT TERM GOAL #3   Title  pt will demo expiratory/inspiratory muscle strength HEP with rare min A over three sessions    Time  4    Period  Weeks    Status  New        SLP SHORT TERM GOAL #4   Title  pt will produce 17/20 sentences  with volume in low 70s dB over three sessions    Time  4    Period  Weeks    Status  New      MRI brain from 9- 19-2006 was again reviewed.  No kidney failure, a transplant surgeon has told him that the hepatic function is well. He has esophageal varices, Vena Porta pressure elevated. He doesn't have cirrhosis.  He never had a transplant.   He has pain in the bottom of both feet and fingertips. Right leg high level of sensory loss than the left. He has spasms, cramps in feet.   He was so busy, he has not noted if sinemet had an added benefit. Tremor is right arm and hand dominant.     Rv 12-13-2017 ,  Mr. Vanengen has been taking Sinemet 100/25 mg tid since last visit with me.  He is not sure if he has seen much improvement also I noticed that his right hand has a much milder resting tremor, and his wife agrees that his tremor amplitude and the fluidity of his movements have both had a positive response.  He continues to take Adderall as prescribed by psychiatry, he struggles with hearing loss, he is status post chemotherapy, he has been unable to take iron supplements because he did not want and to interfere with the carbidopa-levodopa therapy. He has a gait impairment since left femur fracture in 2015.  He has always felt that his neuropathic symptoms and his leg cramps worsened or actually set on a new after that fracture occurred.  He came home from the surgery to repair the fracture and felt as if he "slept on a bed of rocks." He has noticed some increase in leg cramps especially at night while on sinemet which I do not quite understand. He underwent MRI of the cervical spine and brain to evaluate further for Parkinson's versus spinal stenosis.    Chief complaint according to patient : "My right hand is trembling " Dr. [name] referred now for tremor evaluation.  I have the pleasure of meeting Mr. Delcastillo 10-24-2017 , who reports that his right arm and hand  has been trembling.  This is  clearly visible at rest he has a pill-rolling tremor and there is actually some tremor in both upper extremities.  His deep tendon reflexes appear symmetric, there is rigor  over both shoulders and a little bit of crepitation over the left.  He lost muscle mass. He does have a decreased facial mimic, he has titubation.  No abnormal eye movements, there is some dysphonia or hoarseness of the voice noted.  JONEL WELDON is a 78 y.o. male patient who was seen here previously in 2010 - as a patient of [name], MD for a sleep apnea evaluation.  The patient had mild apnea- and was already on CPAP. I referred to Neuropsychology PhD , and from there on to Psychiatry - Dr. Casimiro Needle. The patient continued to use his CPAP, but he also started on Adderall with Dr. Casimiro Needle and has been doing well for the last 6 or 7 years. Today's referral is not for sleep but for tremor evaluation.  The patient apparently carries already the diagnosis of benign essential tremor, but he has noted that his right hand and upper extremity tremor has worsened and the unilateral nature is of more concern.  He has a history of neuropathy, renal portal hypertension, anemia, reflux, thrombocytopenia, hypertension atrial fibrillation, Colon cancer dx in 2/ 2013, with liver damage through chemotherapy, a traumatic brain injury and obstructive sleep apnea.  He has undergone a colon resection in 2013-I had seen him before this diagnosis was made- and a fractured leg in 2015 , he had varicella-zoster 2018 in 2019. He had a upper gi bleed, esophageal veinous bleed with banding in 2018.  Social history: married, no smoker, non drinker. Daughter is 15- behavior analyst , son is 68. Retired from [employer] and [employer].  Caffeine : iced tea, diet sodas. 3-4 a day.    Review of Systems: Out of a complete 14 system review, the patient complains of only the following symptoms, and all other reviewed systems are negative.  Tremor at rest ,  not with maneuvers.   Imaging:STUDY DATE: 11/05/2017 PATIENT NAME: SONG GARRIS DOB: 22-Oct-1940 MRN: [id number]  EXAM: MRI of the cervical spine without contrast  ORDERING CLINICIAN: Asencion Partridge Roiza Wiedel MD CLINICAL HISTORY: 78 year old man with right arm tremor and parkinsonism COMPARISON FILMS: None  TECHNIQUE: MRI of the cervical spine was obtained utilizing 3 mm sagittal slices from the posterior fossa down to the T3-4 level with T1, T2 and inversion recovery views. In addition 4 mm axial slices from K5-9 down to T1-2 level were included with T2 and gradient echo views. CONTRAST: None IMAGING SITE: [facility], [place], [place], [place]  FINDINGS: :  On sagittal images, the spine is imaged from above the cervicomedullary junction to T3.   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  A cyst is noted in the skull just right of midline.  The spinal cord is of normal caliber and signal.   The vertebral bodies are normally aligned.   The vertebral bodies have normal signal.    The discs and interspaces were further evaluated on axial views from C2 to T1 as follows:  C2-C3: The disc and interspace appear normal.  C3-C4: There is mild disc bulging and uncovertebral spurring causing minimal foraminal narrowing but no nerve root compression or spinal stenosis.  C4-C5: There is mild spinal stenosis due to disc bulging, uncovertebral spurring and left greater than right facet hypertrophy.  There is  moderate left greater than right foraminal narrowing but no definite nerve root compression.  C5-C6: There is disc bulging, uncovertebral spurring and minimal left facet hypertrophy.  There is mild left and minimal right foraminal narrowing but no nerve root compression.  C6-C7: There is a small central disc bulge that does not lead to spinal stenosis or nerve root compression.  C7-T1: The disc and interspace appear normal.  T1-T2: The disc and  interspace appear normal.  T2-T3: This level is only evaluated on the sagittal images but there appears to be a disc protrusion distorting the thecal sac without causing spinal cord compression.  There is no nerve root compression.   IMPRESSION: This MRI of the cervical spine without contrast shows the following: 1.    The spinal cord appears normal. 2.    At C4-C5 there is mild spinal stenosis and moderate left greater than right foraminal narrowing.  There does not appear to be nerve root compression. 3.    At T2-T3, there is a disc protrusion that distorts the thecal sac without causing spinal cord compression.  There is no nerve root compression. 4.    There are milder degenerative changes at C3-C4, C5-C6 and C6-C7 thatt do not lead to spinal stenosis or nerve root compression.     INTERPRETING PHYSICIAN:  Richard A. Felecia Shelling, MD, PhD, FAAN Certified in  Neuroimaging by [employer] IMPRESSION: This MRI of the brain with and without contrast shows the following: 1.    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   2.    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 3.    Mild to moderate generalized cortical atrophy that has progressed since the 2006 CT scan. 4.    There are no acute findings.  I showed the patient these images, which do no explain his PD symptoms.   Social History   Socioeconomic History  . Marital status: Married    Spouse name: Not on file  . Number of children: 2  . Years of education: Not on file  . Highest education level: Not on file  Occupational History  . Occupation: Retired  Scientific laboratory technician  . Financial resource strain: Not on file  . Food insecurity    Worry: Not on file    Inability: Not on file  . Transportation needs    Medical: Not on file    Non-medical: Not on file  Tobacco Use  . Smoking status: Former Smoker    Packs/day:  0.50    Years: 7.00    Pack years: 3.50    Types: Cigarettes    Quit date: 03/21/1961    Years since quitting: 57.4  . Smokeless tobacco: Never Used  Substance and Sexual Activity  . Alcohol use: No    Alcohol/week: 0.0 standard drinks  . Drug use: No  . Sexual activity: Yes  Lifestyle  . Physical activity    Days per week: Not on file    Minutes per session: Not on file  . Stress: Not on file  Relationships  . Social Herbalist on phone: Not on file    Gets together: Not on file    Attends religious service: Not on file    Active member of club or organization: Not on file    Attends meetings of clubs or organizations: Not on file    Relationship status: Not on file  .  Intimate partner violence    Fear of current or ex partner: Not on file    Emotionally abused: Not on file    Physically abused: Not on file    Forced sexual activity: Not on file  Other Topics Concern  . Not on file  Social History Narrative  . Not on file    Family History  Problem Relation Age of Onset  . Diabetes Father   . Prostate cancer Father   . Heart attack Father 36  . Heart failure Mother        Congestive heart failure  . Cancer Brother        colon  . Colon cancer Brother 31  . Stomach cancer Sister   . Colon cancer Other   . Colon cancer Sister   . Coronary artery disease Brother        CABG  . Lung cancer Brother   . Anesthesia problems Neg Hx   . Esophageal cancer Neg Hx     Past Medical History:  Diagnosis Date  . Anemia   . Aortic valve disorder 07/17/2018  . Arthritis    "thumbs" (11/29/2013)  . Atrial fibrillation [facility]) 2006   Dr Tonny Bollman af-no meds  . Bleeding esophageal varices ([place])   . BPH (benign prostatic hypertrophy)   . CAD (coronary artery disease)    Mild by Cath 2004. Calcification with mild irregularity.  30% diagonal.  Cardiolite 8/13 normal EF and negative for ischemia (chronically positive ETT)   . CAP (community acquired pneumonia)  02/12/2013  . Cerebellar ataxia in diseases classified elsewhere ([place]) 10/24/2017  . Disorder of bilirubin excretion   . Diverticulosis    Sigmoid colon  . 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 Esophageal stricture   . Esophageal varices determined by endoscopy ([place]) sept 2016  . Fibrosis of liver   . Fracture of distal femur ([place]) 11/29/2013  . GERD (gastroesophageal reflux disease) 03-31-11   tx. Omeprazole  . 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 Hernia, abdominal    at the umbilicus  . History of colon cancer 02/25/2016  . History of esophageal varices   . History of hemorrhoids   . History of irregular heartbeat   . Hypercholesterolemia   . HYPERGLYCEMIA, FASTING 12/13/2007  . Hyperlipidemia   . Malignant neoplasm of ascending colon ([place]) 03/03/2011   s/p right colectomy 04/08/11 Dr. Barkley Bruns Genetic counseling recommendations:  SURVEILLANCE:  Because of the increased risk for cancer in you and your family which we cannot yet define through genetic testing, we urge you and family members to pursue screenings that are recommended for individuals at increased risk of Lynch syndrome (HNPCC)-associated cancers. The following measures are recommend  . Maxillary sinusitis, acute 05/06/2015  . 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 Nephrolithiasis   . Neuropathy 2013   as a result of chemo  . Obstructive sleep apnea 12/14/2007  . OSA on CPAP dx'd 2010   uses cap nightly  . Oxygen deficiency   . Parkinsonism ([place]) 10/24/2017  . Personal history of colonic polyps 09/17/2009   Tubular adenoma  . Pneumonia 01/2013  . 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 Portal hypertension with esophageal varices ([place]) 10/24/2017  . Resting tremor 10/24/2017  . Shortness of breath at rest   . Sleep apnea    uses c pap at home  . TBI (traumatic brain  injury) ([place])  1986   S/P fall; "slow to get going q morning" (10//22/2015)  . Thrombocytopenia ([place]) 02/25/2016  . Titubation 10/24/2017  . Upper GI bleed 02/25/2016  . Viral URI with cough 01/24/2013    Past Surgical History:  Procedure Laterality Date  . APPENDECTOMY  03/2011  . CARDIAC CATHETERIZATION  07/2002   Dr Wynonia Lawman  . COLON SURGERY  2013  . COLONOSCOPY  sept 2016   upper endo and colonoscopy  . ESOPHAGEAL DILATION  2006  . ESOPHAGOGASTRODUODENOSCOPY (EGD) WITH PROPOFOL N/A 02/25/2016   Procedure: ESOPHAGOGASTRODUODENOSCOPY (EGD) WITH PROPOFOL;  Surgeon: Manus Gunning, MD;  Location: [facility];  Service: Gastroenterology;  Laterality: N/A;  . ESOPHAGOGASTRODUODENOSCOPY (EGD) WITH PROPOFOL N/A 03/10/2016   Procedure: ESOPHAGOGASTRODUODENOSCOPY (EGD) WITH PROPOFOL;  Surgeon: Manus Gunning, MD;  Location: [facility];  Service: Gastroenterology;  Laterality: N/A;  . GASTRIC VARICES BANDING N/A 03/10/2016   Procedure: GASTRIC VARICES BANDING;  Surgeon: Manus Gunning, MD;  Location: [facility];  Service: Gastroenterology;  Laterality: N/A;  . INGUINAL HERNIA REPAIR Left ~ 1991  . LAPAROSCOPIC PARTIAL COLECTOMY  04/08/2011  . ORIF DISTAL FEMUR FRACTURE Right 11/29/2013   "put plate in"  . ORIF FEMUR FRACTURE Right 11/29/2013   Procedure: OPEN REDUCTION INTERNAL FIXATION (ORIF) RIGHT  DISTAL FEMUR FRACTURE;  Surgeon: Rozanna Box, MD;  Location: [facility];  Service: Orthopedics;  Laterality: Right;  . PORT-A-CATH REMOVAL  11/09/2011   Procedure: REMOVAL PORT-A-CATH;  Surgeon: Odis Hollingshead, MD;  Location: [facility];  Service: General;  Laterality: N/A;  . PORTACATH PLACEMENT  04/30/2011   Procedure: INSERTION PORT-A-CATH;  Surgeon: Odis Hollingshead, MD;  Location: [facility];  Service: General;  Laterality: Right;    Current Outpatient Medications  Medication Sig Dispense Refill  . amphetamine-dextroamphetamine (ADDERALL) 30 MG tablet Take 10-20 mg by  mouth daily with breakfast.     . carbidopa-levodopa (SINEMET IR) 25-100 MG tablet Take 1 tablet by mouth 4 (four) times daily. 360 tablet 2  . ferrous sulfate 325 (65 FE) MG tablet Take 325 mg by mouth 2 (two) times daily.    . furosemide (LASIX) 20 MG tablet TK 1 T PO D    . pantoprazole (PROTONIX) 40 MG tablet Take 1 tablet (40 mg total) by mouth 2 (two) times daily. 60 tablet 0  . propranolol (INDERAL) 20 MG tablet Take 1 tablet (20 mg total) by mouth 2 (two) times daily. 60 tablet 0  . spironolactone (ALDACTONE) 50 MG tablet TK 1 T PO D     No current facility-administered medications for this visit.     Allergies as of 08/16/2018  . (No Known Allergies)    Vitals: BP 116/65   Pulse 89   Temp 98.2 F (36.8 C)   Ht 6\' 2"  (1.88 m)   Wt 170 lb (77.1 kg)   BMI 21.83 kg/m  Last Weight:  Wt Readings from Last 1 Encounters:  08/16/18 170 lb (77.1 kg)   EUM:PNTI mass index is 21.83 kg/m.     Last Height:   Ht Readings from Last 1 Encounters:  08/16/18 6\' 2"  (1.88 m)    Physical exam:  General: The patient is awake, alert and appears not in acute distress. The patient has a stooped position. He has hoarse voice with little cadence.  Head: Normocephalic, atraumatic.  Neck is supple. Mallampati 2. His movements are slowed.  neck circumference:15. Nasal airflow patent, titubation is seen.   Cardiovascular:  Regular  rate and rhythm, without  murmurs or carotid bruit, and without distended neck veins.Respiratory: Lungs are clear to auscultation. Skin:  Without evidence of edema, or rash. Trunk: BMI is 22.7 .  Neurologic exam : The patient is awake and alert, oriented to place and time.   Memory subjective described as intact. Attention span & concentration ability appears normal.   Speech is fluent,  with more dysphonia/ He reports  improved dysphagia.   Mood and affect are appropriate.  Cranial nerves: Pupils are equal and briskly reactive to light. Funduscopic exam without  evidence of pallor or edema. Rare blinking. Extraocular movements in vertical and horizontal planes intact and without nystagmus. Visual fields by finger perimetry are intact. Hearing to finger rub impaired - forgot hearing aids today . Facial sensation intact to fine touch. Facial expression is masked.  Motor strength is symmetric-  tongue and uvula move midline.  Tongue is not tremolous.  Shoulder shrug was symmetrical.   Motor exam:  increased tone without cog wheeling, reduced  muscle bulk  ( very slim ) and symmetric strength in all extremities. Sensory:  Fine touch, pinprick and vibration were tested in all extremities.  There is no preserved sensation to vibration in either foot or ankle.    At the knee there is a reduced ability to feel vibration, the sensation is preserved in fingers hands and upper extremity.  Proprioception tested in the upper extremities was normal. Coordination: Rapid alternating movements in the fingers/hands was slowed - Finger-to-nose maneuver  normal without evidence of ataxia, dysmetria or tremor !  Gait and station: Patient walks without assistive device , his right leg appears stiff and shorter , the foot drops  .Tandem gait is unfragmented. He Turns with 3 Steps. He does not stride, walks stooped but is not shuffling. Loss of  Right arm swing and noted severe pill rolling tremor on the right hand .  Romberg testing: unsteady stance with closed eyes.    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 He added a protein shake each morning. He completed speech and swallowing therapy.   2) Esophageal  varicies. GI follows. cancer survivor.   3) neuropathy- This began after chemotherapy 2012.   4) OSA on CPAP. FFM user.    I spent more than 20 minutes of face to face time with the patient.  Greater than 50% of time was spent in counseling and coordination of care. We have discussed the diagnosis and differential and I answered the patient's questions.    Plan:  Treatment plan and additional workup :  Sinemet 25/100 Mg at qid.  Patient would like to try a F 30 I or dream-wear with nasal pillows.    Rv in 6 month with NP. Please obtain download, review Epworth and gait .    Larey Seat, MD 09/12/4625, 0:35 PM  Certified in Neurology by ABPN Certified in [place] by [facility] [address], [place], [place] [address]

## 2018-08-18 DIAGNOSIS — H60502 Unspecified acute noninfective otitis externa, left ear: Secondary | ICD-10-CM | POA: Diagnosis not present

## 2018-08-18 DIAGNOSIS — H6692 Otitis media, unspecified, left ear: Secondary | ICD-10-CM | POA: Diagnosis not present

## 2018-08-18 DIAGNOSIS — H6123 Impacted cerumen, bilateral: Secondary | ICD-10-CM | POA: Diagnosis not present

## 2018-08-24 DIAGNOSIS — I8510 Secondary esophageal varices without bleeding: Secondary | ICD-10-CM | POA: Diagnosis not present

## 2018-08-24 DIAGNOSIS — I4891 Unspecified atrial fibrillation: Secondary | ICD-10-CM | POA: Diagnosis not present

## 2018-08-24 DIAGNOSIS — I10 Essential (primary) hypertension: Secondary | ICD-10-CM | POA: Diagnosis not present

## 2018-08-24 DIAGNOSIS — K219 Gastro-esophageal reflux disease without esophagitis: Secondary | ICD-10-CM | POA: Diagnosis not present

## 2018-08-24 DIAGNOSIS — D696 Thrombocytopenia, unspecified: Secondary | ICD-10-CM | POA: Diagnosis not present

## 2018-08-24 DIAGNOSIS — Z9221 Personal history of antineoplastic chemotherapy: Secondary | ICD-10-CM | POA: Diagnosis not present

## 2018-08-24 DIAGNOSIS — Z87891 Personal history of nicotine dependence: Secondary | ICD-10-CM | POA: Diagnosis not present

## 2018-08-24 DIAGNOSIS — Z85038 Personal history of other malignant neoplasm of large intestine: Secondary | ICD-10-CM | POA: Diagnosis not present

## 2018-08-24 DIAGNOSIS — Z8719 Personal history of other diseases of the digestive system: Secondary | ICD-10-CM | POA: Diagnosis not present

## 2018-08-24 DIAGNOSIS — K317 Polyp of stomach and duodenum: Secondary | ICD-10-CM | POA: Diagnosis not present

## 2018-08-24 DIAGNOSIS — D649 Anemia, unspecified: Secondary | ICD-10-CM | POA: Diagnosis not present

## 2018-08-24 DIAGNOSIS — K7460 Unspecified cirrhosis of liver: Secondary | ICD-10-CM | POA: Diagnosis not present

## 2018-08-24 DIAGNOSIS — I8500 Esophageal varices without bleeding: Secondary | ICD-10-CM | POA: Diagnosis not present

## 2018-08-24 DIAGNOSIS — Z9049 Acquired absence of other specified parts of digestive tract: Secondary | ICD-10-CM | POA: Diagnosis not present

## 2018-08-25 ENCOUNTER — Other Ambulatory Visit (INDEPENDENT_AMBULATORY_CARE_PROVIDER_SITE_OTHER): Payer: Medicare Other

## 2018-08-25 ENCOUNTER — Telehealth: Payer: Self-pay | Admitting: Cardiology

## 2018-08-25 DIAGNOSIS — I480 Paroxysmal atrial fibrillation: Secondary | ICD-10-CM

## 2018-08-25 NOTE — Telephone Encounter (Signed)
Patient states that he had an endoscopy yesterday and when they hooked him up to the monitor his heart rhythm was atrial fibrillation. Patient has been checking his heart rhythm today on his apple watch and it is showing atrial fibrillation as well.   During patient's virtual visit on 07/18/2018, Dr. Bettina Gavia ordered a 7 day ZIO monitor. Patient states that he has received the monitor but has not worn it yet. Advised patient to place the monitor today and mail it back to the company in 7 days for further evaluation of atrial fibrillation.  Please advise if you have any further recommendations. Thanks!

## 2018-08-25 NOTE — Telephone Encounter (Signed)
Pt states he's in afib and wants to know what to do

## 2018-09-11 ENCOUNTER — Telehealth: Payer: Self-pay | Admitting: *Deleted

## 2018-09-11 NOTE — Telephone Encounter (Signed)
-----   Message from Richardo Priest, MD sent at 09/11/2018 12:47 PM EDT ----- Normal or stable result  As expected he is in atrial fibrillation the rate is controlled I like him to see me the next time in the [facility] office.

## 2018-09-11 NOTE — Telephone Encounter (Signed)
Left message to call back regarding monitor

## 2018-09-18 NOTE — Progress Notes (Signed)
Cardiology Office Note:    Date:  09/19/2018   ID:  Russell [name], DOB May 08, 1940, MRN [id number]  PCP:  Burnard Bunting, MD  Cardiologist:  Shirlee More, MD    Referring MD: Burnard Bunting, MD    ASSESSMENT:    1. 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 Mixed hyperlipidemia   3. Paroxysmal atrial fibrillation (HCC)   4. Nonrheumatic aortic valve insufficiency    PLAN:    In order of problems listed above:  1. CAD -stable [place] [name] Association class I medical therapy this time would not advise an ischemia evaluation 2. HLD -not on statin due to liver disease 3. Atrial fibrillation -persistent asymptomatic rate controlled I would not anticoagulate 4. Mild aortic regurgitation -stable 5. Cirrhosis - noted history. Follows with Gi. 6. Parkinson's - Noted history. Follows with neurology.    Next appointment: 6 months   Medication Adjustments/Labs and Tests Ordered: Current medicines are reviewed at length with the patient today.  Concerns regarding medicines are outlined above.  Orders Placed This Encounter  Procedures  . Basic Metabolic Panel (BMET)  . EKG 12-Lead   No orders of the defined types were placed in this encounter.   Chief Complaint  Patient presents with  . Follow-up  . Coronary Artery Disease  . Atrial Fibrillation    History of Present Illness:    Russell [name] is a 78 y.o. male with a hx of CAD hyperlipidemia , PAF, and chronic venous insufficiency, Parkinson's last seen 07/18/18. He does not have a history of [name] failure he is not on aspirin because of cirrhosis portal hypertension and GI bleed and because of liver disease his statin was stopped a year ago.   At his last visit he was mailed a Zio monitor as he thought he was having recurrent PAF. Called our office to inform us that during endoscopy 08/24/18 he had atrial fibrillation. Zio 09/08/18 shows atrial fibrillation with  minimum, average, and maximum HR 47, 80, 124 respectively.   Compliance with diet, lifestyle and medications: Yes  Overall he is doing well he has esophageal varices portal hypertension blood once in the past.  He tells me his gastroenterologist does not feel he should be anticoagulated wholeheartedly agree.  He takes propranolol for portal hypertension his rate is controlled he is asymptomatic from atrial fibrillation I would not raise the issue of restoring sinus rhythm or cardioversion.  No edema chest pain palpitation or syncope.   Past Medical History:  Diagnosis Date  . Anemia   . Aortic valve disorder 07/17/2018  . Arthritis    "thumbs" (11/29/2013)  . Atrial fibrillation [facility]) 2006   Dr Tonny Bollman af-no meds  . Bleeding esophageal varices ([place])   . BPH (benign prostatic hypertrophy)   . CAD (coronary artery disease)    Mild by Cath 2004. Calcification with mild irregularity.  30% diagonal.  Cardiolite 8/13 normal EF and negative for ischemia (chronically positive ETT)   . CAP (community acquired pneumonia) 02/12/2013  . Cerebellar ataxia in diseases classified elsewhere ([place]) 10/24/2017  . Disorder of bilirubin excretion   . Diverticulosis    Sigmoid colon  . 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 Esophageal stricture   . Esophageal varices determined by endoscopy ([place]) sept 2016  . Fibrosis of liver   . Fracture of distal femur ([place]) 11/29/2013  . GERD (gastroesophageal reflux disease) 03-31-11   tx. Omeprazole  . 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 Hernia, abdominal    at the umbilicus  . History of colon cancer 02/25/2016  . History of esophageal varices   . History of hemorrhoids   . History of irregular heartbeat   . Hypercholesterolemia   . HYPERGLYCEMIA, FASTING 12/13/2007  . Hyperlipidemia   . Malignant neoplasm of ascending colon ([place]) 03/03/2011   s/p right colectomy 04/08/11 Dr. Barkley Bruns Genetic counseling recommendations:  SURVEILLANCE:   Because of the increased risk for cancer in you and your family which we cannot yet define through genetic testing, we urge you and family members to pursue screenings that are recommended for individuals at increased risk of Lynch syndrome (HNPCC)-associated cancers. The following measures are recommend  . Maxillary sinusitis, acute 05/06/2015  . 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 Nephrolithiasis   . Neuropathy 2013   as a result of chemo  . Obstructive sleep apnea 12/14/2007  . OSA on CPAP dx'd 2010   uses cap nightly  . Oxygen deficiency   . Parkinsonism ([place]) 10/24/2017  . Personal history of colonic polyps 09/17/2009   Tubular adenoma  . Pneumonia 01/2013  . 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 Portal hypertension with esophageal varices ([place]) 10/24/2017  . Resting tremor 10/24/2017  . Shortness of breath at rest   . Sleep apnea    uses c pap at home  . TBI (traumatic brain injury) ([place]) 1986   S/P fall; "slow to get going q morning" (10//22/2015)  . Thrombocytopenia ([place]) 02/25/2016  . Titubation 10/24/2017  . Upper GI bleed 02/25/2016  . Viral URI with cough 01/24/2013    Past Surgical History:  Procedure Laterality Date  . APPENDECTOMY  03/2011  . CARDIAC CATHETERIZATION  07/2002   Dr Wynonia Lawman  . COLON SURGERY  2013  . COLONOSCOPY  sept 2016   upper endo and colonoscopy  . ESOPHAGEAL DILATION  2006  . ESOPHAGOGASTRODUODENOSCOPY (EGD) WITH PROPOFOL N/A 02/25/2016   Procedure: ESOPHAGOGASTRODUODENOSCOPY (EGD) WITH PROPOFOL;  Surgeon: Manus Gunning, MD;  Location: [facility];  Service: Gastroenterology;  Laterality: N/A;  . ESOPHAGOGASTRODUODENOSCOPY (EGD) WITH PROPOFOL N/A 03/10/2016   Procedure: ESOPHAGOGASTRODUODENOSCOPY (EGD) WITH PROPOFOL;  Surgeon: Manus Gunning, MD;  Location: [facility];  Service: Gastroenterology;  Laterality: N/A;   . GASTRIC VARICES BANDING N/A 03/10/2016   Procedure: GASTRIC VARICES BANDING;  Surgeon: Manus Gunning, MD;  Location: [facility];  Service: Gastroenterology;  Laterality: N/A;  . INGUINAL HERNIA REPAIR Left ~ 1991  . LAPAROSCOPIC PARTIAL COLECTOMY  04/08/2011  . ORIF DISTAL FEMUR FRACTURE Right 11/29/2013   "put plate in"  . ORIF FEMUR FRACTURE Right 11/29/2013   Procedure: OPEN REDUCTION INTERNAL FIXATION (ORIF) RIGHT  DISTAL FEMUR FRACTURE;  Surgeon: Rozanna Box, MD;  Location: [place];  Service: Orthopedics;  Laterality: Right;  . PORT-A-CATH REMOVAL  11/09/2011   Procedure: REMOVAL PORT-A-CATH;  Surgeon: Odis Hollingshead, MD;  Location: [place];  Service: General;  Laterality: N/A;  . PORTACATH PLACEMENT  04/30/2011   Procedure: INSERTION PORT-A-CATH;  Surgeon: Odis Hollingshead, MD;  Location: [place];  Service: General;  Laterality: Right;    Current Medications: Current Meds  Medication Sig  . amphetamine-dextroamphetamine (ADDERALL) 30 MG tablet Take 10-20 mg by mouth daily with breakfast.   . carbidopa-levodopa (SINEMET IR) 25-100 MG tablet Take 1 tablet by mouth 4 (four) times daily.  Marland Kitchen  ferrous sulfate 325 (65 FE) MG tablet Take 325 mg by mouth 2 (two) times daily.  . furosemide (LASIX) 20 MG tablet Take 20 mg by mouth daily as needed.  . pantoprazole (PROTONIX) 40 MG tablet Take 1 tablet (40 mg total) by mouth 2 (two) times daily.  . propranolol (INDERAL) 20 MG tablet Take 1 tablet (20 mg total) by mouth 2 (two) times daily.  Marland Kitchen spironolactone (ALDACTONE) 50 MG tablet TK 1 T PO D     Allergies:   Patient has no known allergies.   Social History   Socioeconomic History  . Marital status: Married    Spouse name: Not on file  . Number of children: 2  . Years of education: Not on file  . Highest education level: Not on file  Occupational History  . Occupation: Retired  Scientific laboratory technician  . Financial resource strain: Not on file  . Food insecurity     Worry: Not on file    Inability: Not on file  . Transportation needs    Medical: Not on file    Non-medical: Not on file  Tobacco Use  . Smoking status: Former Smoker    Packs/day: 0.50    Years: 7.00    Pack years: 3.50    Types: Cigarettes    Quit date: 03/21/1961    Years since quitting: 57.5  . Smokeless tobacco: Never Used  Substance and Sexual Activity  . Alcohol use: No    Alcohol/week: 0.0 standard drinks  . Drug use: No  . Sexual activity: Yes  Lifestyle  . Physical activity    Days per week: Not on file    Minutes per session: Not on file  . Stress: Not on file  Relationships  . Social Herbalist on phone: Not on file    Gets together: Not on file    Attends religious service: Not on file    Active member of club or organization: Not on file    Attends meetings of clubs or organizations: Not on file    Relationship status: Not on file  Other Topics Concern  . Not on file  Social History Narrative  . Not on file     Family History: The patient's family history includes Cancer in his brother; Colon cancer in his sister and another family member; Colon cancer (age of onset: 63) in his brother; Coronary artery disease in his brother; Diabetes in his father; [name] attack (age of onset: 26) in his father; [name] failure in his mother; Lung cancer in his brother; Prostate cancer in his father; Stomach cancer in his sister. There is no history of Anesthesia problems or Esophageal cancer. ROS:   Please see the history of present illness.    All other systems reviewed and are negative.  EKGs/Labs/Other Studies Reviewed:    The following studies were reviewed today:  Zio 08/2018 A ZIO monitor was performed for 6 days 22 hours beginning 08/25/2018 to assess atrial fibrillation.   The rhythm throughout is atrial fibrillation minimum average and maximum [name] rates of 47, 80 and 124 bpm.   There were no pauses of 3 seconds or greater.   Ventricular ectopy  is rare with isolated PVCs and couplets.   There was one triggered and one diary event both associated with atrial fibrillation without arrhythmia or pauses.   Conclusion atrial fibrillation controlled rate.   EKG:  EKG ordered today and personally reviewed.  The ekg ordered today demonstrates atrial fibrillation  controlled rate  Recent Labs: 10/24/2017: ALT 23; BUN 16; Creatinine, Ser 1.02; Potassium 4.2; Sodium 140 02/21/2018: Hemoglobin 14.3; Platelet Count 105  Recent Lipid Panel    Component Value Date/Time   CHOL 127 04/10/2014 0825   TRIG 71.0 04/10/2014 0825   HDL 46.20 04/10/2014 0825   CHOLHDL 3 04/10/2014 0825   VLDL 14.2 04/10/2014 0825   LDLCALC 67 04/10/2014 0825    Physical Exam:    VS:  BP 112/76 (BP Location: Right Arm, Patient Position: Sitting, Cuff Size: Normal)   Pulse 75   Ht 6\' 2"  (1.88 m)   Wt 168 lb (76.2 kg)   SpO2 98%   BMI 21.57 kg/m     Wt Readings from Last 3 Encounters:  09/19/18 168 lb (76.2 kg)  08/16/18 170 lb (77.1 kg)  07/18/18 170 lb (77.1 kg)     GEN: He looks frail he has a resting tremor muscle wasting  in no acute distress HEENT: Normal NECK: No JVD; No carotid bruits LYMPHATICS: No lymphadenopathy CARDIAC: Irregular irregular rhythm variable first [name] sound  no murmurs, rubs, gallops RESPIRATORY:  Clear to auscultation without rales, wheezing or rhonchi  ABDOMEN: Soft, non-tender, non-distended MUSCULOSKELETAL:  No edema; No deformity  SKIN: Warm and dry NEUROLOGIC:  Alert and oriented x 3 PSYCHIATRIC:  Normal affect    Signed, Shirlee More, MD  09/19/2018 2:06 PM    [place]

## 2018-09-19 ENCOUNTER — Ambulatory Visit (INDEPENDENT_AMBULATORY_CARE_PROVIDER_SITE_OTHER): Payer: Medicare Other | Admitting: Cardiology

## 2018-09-19 ENCOUNTER — Other Ambulatory Visit: Payer: Self-pay

## 2018-09-19 VITALS — BP 112/76 | HR 75 | Ht 74.0 in | Wt 168.0 lb

## 2018-09-19 DIAGNOSIS — I480 Paroxysmal atrial fibrillation: Secondary | ICD-10-CM

## 2018-09-19 DIAGNOSIS — I25118 Atherosclerotic heart disease of native coronary artery with other forms of angina pectoris: Secondary | ICD-10-CM

## 2018-09-19 DIAGNOSIS — E782 Mixed hyperlipidemia: Secondary | ICD-10-CM | POA: Diagnosis not present

## 2018-09-19 DIAGNOSIS — I351 Nonrheumatic aortic (valve) insufficiency: Secondary | ICD-10-CM

## 2018-09-19 NOTE — Patient Instructions (Signed)
Medication Instructions:  Your physician recommends that you continue on your current medications as directed. Please refer to the Current Medication list given to you today.  If you need a refill on your cardiac medications before your next appointment, please call your pharmacy.   Lab work: Your physician recommends that you return for lab work today: BMP.   If you have labs (blood work) drawn today and your tests are completely normal, you will receive your results only by: Marland Kitchen MyChart Message (if you have MyChart) OR . A paper copy in the mail If you have any lab test that is abnormal or we need to change your treatment, we will call you to review the results.  Testing/Procedures: You had an EKG today.   Follow-Up: At [facility], you and your health needs are our priority.  As part of our continuing mission to provide you with exceptional heart care, we have created designated Provider Care Teams.  These Care Teams include your primary Cardiologist (physician) and Advanced Practice Providers (APPs -  Physician Assistants and Nurse Practitioners) who all work together to provide you with the care you need, when you need it. You will need a follow up appointment in 6 months.  Please call our office 2 months in advance to schedule this appointment.

## 2018-09-20 ENCOUNTER — Telehealth: Payer: Self-pay | Admitting: *Deleted

## 2018-09-20 LAB — BASIC METABOLIC PANEL
BUN/Creatinine Ratio: 15 (ref 10–24)
BUN: 15 mg/dL (ref 8–27)
CO2: 23 mmol/L (ref 20–29)
Calcium: 9.5 mg/dL (ref 8.6–10.2)
Chloride: 102 mmol/L (ref 96–106)
Creatinine, Ser: 1 mg/dL (ref 0.76–1.27)
GFR calc Af Amer: 84 mL/min/{1.73_m2} (ref >59)
GFR calc non Af Amer: 73 mL/min/{1.73_m2} (ref >59)
Glucose: 150 mg/dL — ABNORMAL HIGH (ref 65–99)
Potassium: 4.3 mmol/L (ref 3.5–5.2)
Sodium: 140 mmol/L (ref 134–144)

## 2018-09-20 NOTE — Telephone Encounter (Signed)
-----   Message from Richardo Priest, MD sent at 09/20/2018  8:08 AM EDT ----- Normal or stable result

## 2018-09-20 NOTE — Telephone Encounter (Signed)
Telephone call to patient. Left message that lab results were stable/normal and to call with any questions.

## 2018-10-18 DIAGNOSIS — L821 Other seborrheic keratosis: Secondary | ICD-10-CM | POA: Diagnosis not present

## 2018-10-18 DIAGNOSIS — Z85828 Personal history of other malignant neoplasm of skin: Secondary | ICD-10-CM | POA: Diagnosis not present

## 2018-10-18 DIAGNOSIS — L720 Epidermal cyst: Secondary | ICD-10-CM | POA: Diagnosis not present

## 2018-10-18 DIAGNOSIS — D225 Melanocytic nevi of trunk: Secondary | ICD-10-CM | POA: Diagnosis not present

## 2018-10-31 DIAGNOSIS — I8510 Secondary esophageal varices without bleeding: Secondary | ICD-10-CM | POA: Diagnosis not present

## 2018-10-31 DIAGNOSIS — Z9049 Acquired absence of other specified parts of digestive tract: Secondary | ICD-10-CM | POA: Diagnosis not present

## 2018-10-31 DIAGNOSIS — I4891 Unspecified atrial fibrillation: Secondary | ICD-10-CM | POA: Diagnosis not present

## 2018-10-31 DIAGNOSIS — K7460 Unspecified cirrhosis of liver: Secondary | ICD-10-CM | POA: Diagnosis not present

## 2018-10-31 DIAGNOSIS — I8500 Esophageal varices without bleeding: Secondary | ICD-10-CM | POA: Diagnosis not present

## 2018-10-31 DIAGNOSIS — K317 Polyp of stomach and duodenum: Secondary | ICD-10-CM | POA: Diagnosis not present

## 2018-10-31 DIAGNOSIS — I10 Essential (primary) hypertension: Secondary | ICD-10-CM | POA: Diagnosis not present

## 2018-10-31 DIAGNOSIS — Z9221 Personal history of antineoplastic chemotherapy: Secondary | ICD-10-CM | POA: Diagnosis not present

## 2018-10-31 DIAGNOSIS — Z85038 Personal history of other malignant neoplasm of large intestine: Secondary | ICD-10-CM | POA: Diagnosis not present

## 2018-11-06 NOTE — Progress Notes (Signed)
Cardiology Office Note:    Date:  11/07/2018   ID:  [name], DOB Jun 12, 1940, MRN [id number]  PCP:  Burnard Bunting, MD  Cardiologist:  Shirlee More, MD    Referring MD: Burnard Bunting, MD    ASSESSMENT:    1. Shortness of breath   2. Leg swelling   3. 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 Other persistent atrial fibrillation    PLAN:    In order of problems listed above:  1. Clinically this appears to develop fluid overload element of heart failure superimposed on atrial fibrillation and chronic liver disease, him start taking minimal dose of loop diuretic and reassess in the office with lab work and office visit including renal function potassium and proBNP. 2. Atrial fibrillation stable I think he is an absolute contraindication to anticoagulation attempts to resume sinus rhythm rate is controlled with his beta-blocker liver disease   Next appointment: 4 weeks   Medication Adjustments/Labs and Tests Ordered: Current medicines are reviewed at length with the patient today.  Concerns regarding medicines are outlined above.  Orders Placed This Encounter  Procedures   Basic Metabolic Panel (BMET)   Pro b natriuretic peptide (BNP)   Meds ordered this encounter  Medications   furosemide (LASIX) 20 MG tablet    Sig: Take 1 tablet daily on Monday, Wednesday, and Friday.    Dispense:  36 tablet    Refill:  0    Chief Complaint  Patient presents with   Follow-up   Atrial Fibrillation    History of Present Illness:    [name] is a 78 y.o. male with a hx of CAD hyperlipidemia , PAF, and chronic venous insufficiency, Parkinson's. He does not have a history of heart failure he is not on aspirin because of cirrhosis portal hypertension and GI bleed and because of liver disease his statin was stopped a year ago.  He was ast seen 09/19/2018 with persistent rate controlled atrial fibrillation and was not advised  anticoagulation with his liver disease and Parkinson's disease.. Compliance with diet, lifestyle and medications: Yes although he takes his distal diuretic intermittently  He is not doing as well he has had more banding of varices he feels increasingly weak some shortness of breath and increasing edema since he has been in persistent atrial fibrillation.  He is also losing muscle weight strength and has trouble swallowing with his Parkinson's.  He has been thinking about atrial fibrillation asked me about attempts to resume sinus rhythm and after discussion and shared decision making we will not anticoagulated not attempt to resume sinus rhythm.  His rate is controlled and will put him on a low dose of loop diuretic for fluid overload rechecking labs and a follow-up office visit.  I think his comorbidities contribute predominantly to his sense of medical deterioration.  He has had no chest pain orthopnea or syncope. Past Medical History:  Diagnosis Date   Anemia    Aortic valve disorder 07/17/2018   Arthritis    "thumbs" (11/29/2013)   Atrial fibrillation ([place]) 2006   Dr Tonny Bollman af-no meds   Bleeding esophageal varices (HCC)    BPH (benign prostatic hypertrophy)    CAD (coronary artery disease)    Mild by Cath 2004. Calcification with mild irregularity.  30% diagonal.  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 Omeprazole   Hereditary and idiopathic peripheral neuropathy 02/12/2014   Hernia, abdominal    at the  umbilicus   History of colon cancer 02/25/2016   History of esophageal varices    History of hemorrhoids    History of irregular heartbeat    Hypercholesterolemia    HYPERGLYCEMIA, FASTING 12/13/2007   Hyperlipidemia    Malignant neoplasm of ascending colon ([place]) 03/03/2011   s/p right colectomy 04/08/11 Dr. Barkley Bruns Genetic counseling recommendations:  SURVEILLANCE:  Because of the increased risk for cancer in you and your family which we cannot yet define through genetic testing, we urge you and family members to pursue screenings that are recommended for individuals at increased risk of Lynch syndrome (HNPCC)-associated cancers. 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    inactive diagnosis replacement due to IMO   Nephrolithiasis    Neuropathy 2013   as a result of chemo   Obstructive sleep apnea 12/14/2007   OSA on CPAP dx'd 2010   uses cap nightly   Oxygen deficiency    Parkinsonism ([place]) 10/24/2017   Personal history of colonic polyps 09/17/2009   Tubular adenoma   Pneumonia 01/2013   Portal hypertension (HCC)    non-cirrhotic portal hypertension due to oxaliplatin induced sinusoidal sclerosis   Portal hypertension with esophageal varices ([place]) 10/24/2017   Resting tremor 10/24/2017   Shortness of breath at rest    Sleep apnea    uses c pap at home   TBI (traumatic brain injury) ([place]) 1986   S/P fall; "slow to get going q morning" (10//22/2015)   Thrombocytopenia ([place]) 02/25/2016   Titubation 10/24/2017   Upper GI bleed 02/25/2016   Viral URI with cough 01/24/2013    Past Surgical History:  Procedure Laterality Date   APPENDECTOMY  03/2011   CARDIAC CATHETERIZATION  07/2002   Dr Wynonia Lawman   COLON SURGERY  2013   COLONOSCOPY  sept 2016   upper endo and colonoscopy   ESOPHAGEAL DILATION  2006   ESOPHAGOGASTRODUODENOSCOPY (EGD) WITH PROPOFOL N/A 02/25/2016   Procedure:  ESOPHAGOGASTRODUODENOSCOPY (EGD) WITH PROPOFOL;  Surgeon: Manus Gunning, MD;  Location: [facility];  Service: Gastroenterology;  Laterality: N/A;   ESOPHAGOGASTRODUODENOSCOPY (EGD) WITH PROPOFOL N/A 03/10/2016   Procedure: ESOPHAGOGASTRODUODENOSCOPY (EGD) WITH PROPOFOL;  Surgeon: Manus Gunning, MD;  Location: [facility];  Service: Gastroenterology;  Laterality: N/A;   GASTRIC VARICES BANDING N/A 03/10/2016   Procedure: GASTRIC VARICES BANDING;  Surgeon: Manus Gunning, MD;  Location: [facility];  Service: Gastroenterology;  Laterality: N/A;   INGUINAL HERNIA REPAIR Left ~ [place]  04/08/2011   ORIF DISTAL FEMUR FRACTURE Right 11/29/2013   "put plate in"   ORIF FEMUR FRACTURE Right 11/29/2013   Procedure: OPEN REDUCTION INTERNAL FIXATION (ORIF) RIGHT  DISTAL FEMUR FRACTURE;  Surgeon: Rozanna Box, MD;  Location: [facility];  Service: Orthopedics;  Laterality: Right;   PORT-A-CATH REMOVAL  11/09/2011   Procedure: REMOVAL PORT-A-CATH;  Surgeon: Odis Hollingshead, MD;  Location: [facility];  Service: General;  Laterality: N/A;  PORTACATH PLACEMENT  04/30/2011   Procedure: INSERTION PORT-A-CATH;  Surgeon: Odis Hollingshead, MD;  Location: [facility];  Service: General;  Laterality: Right;    Current Medications: Current Meds  Medication Sig   amphetamine-dextroamphetamine (ADDERALL) 30 MG tablet Take 10-20 mg by mouth daily with breakfast.    carbidopa-levodopa (SINEMET IR) 25-100 MG tablet Take 1 tablet by mouth 4 (four) times daily.   ferrous sulfate 325 (65 FE) MG tablet Take 325 mg by mouth 2 (two) times daily.   furosemide (LASIX) 20 MG tablet Take 1 tablet daily on Monday, Wednesday, and Friday.   pantoprazole (PROTONIX) 40 MG tablet Take 1 tablet (40 mg total) by mouth 2 (two) times daily.   propranolol (INDERAL) 20 MG tablet Take 1 tablet (20 mg total) by mouth 2 (two) times daily.   spironolactone  (ALDACTONE) 50 MG tablet TK 1 T PO D   [DISCONTINUED] furosemide (LASIX) 20 MG tablet Take 20 mg by mouth daily as needed.     Allergies:   Patient has no known allergies.   Social History   Socioeconomic History   Marital status: Married    Spouse name: Not on file   Number of children: 2   Years of education: Not on file   Highest education level: Not on file  Occupational History   Occupation: Retired  Scientist, product/process development strain: Not on file   Food insecurity    Worry: Not on file    Inability: Not on Lexicographer needs    Medical: Not on file    Non-medical: Not on file  Tobacco Use   Smoking status: Former Smoker    Packs/day: 0.50    Years: 7.00    Pack years: 3.50    Types: Cigarettes    Quit date: 03/21/1961    Years since quitting: 57.6   Smokeless tobacco: Never Used  Substance and Sexual Activity   Alcohol use: No    Alcohol/week: 0.0 standard drinks   Drug use: No   Sexual activity: Yes  Lifestyle   Physical activity    Days per week: Not on file    Minutes per session: Not on file   Stress: Not on file  Relationships   Social connections    Talks on phone: Not on file    Gets together: Not on file    Attends religious service: Not on file    Active member of club or organization: Not on file    Attends meetings of clubs or organizations: Not on file    Relationship status: Not on file  Other Topics Concern   Not on file  Social History Narrative   Not on file     Family History: The patient's family history includes Cancer in his brother; Colon cancer in his sister and another family member; Colon cancer (age of onset: 21) in his brother; Coronary artery disease in his brother; Diabetes in his father; Heart attack (age of onset: 75) in his father; Heart failure in his mother; Lung cancer in his brother; Prostate cancer in his father; Stomach cancer in his sister. There is no history of Anesthesia problems  or Esophageal cancer. ROS:   Please see the history of present illness.    All other systems reviewed and are negative.  EKGs/Labs/Other Studies Reviewed:    The following studies were reviewed today  Recent Labs: 02/21/2018: Hemoglobin 14.3; Platelet Count 105 09/19/2018: BUN 15; Creatinine, Ser 1.00; Potassium 4.3; Sodium  140  Recent Lipid Panel    Component Value Date/Time   CHOL 127 04/10/2014 0825   TRIG 71.0 04/10/2014 0825   HDL 46.20 04/10/2014 0825   CHOLHDL 3 04/10/2014 0825   VLDL 14.2 04/10/2014 0825   LDLCALC 67 04/10/2014 0825    Physical Exam:    VS:  BP 110/80 (BP Location: Right Arm, Patient Position: Sitting, Cuff Size: Normal)    Pulse 98    Temp (!) 97.4 F (36.3 C)    Ht 6\' 2"  (1.88 m)    Wt 164 lb 1.9 oz (74.4 kg)    SpO2 93%    BMI 21.07 kg/m     Wt Readings from Last 3 Encounters:  11/07/18 164 lb 1.9 oz (74.4 kg)  09/19/18 168 lb (76.2 kg)  08/16/18 170 lb (77.1 kg)     GEN: Increasingly looks chronically ill and debilitated in no acute distress HEENT: Normal NECK: No JVD; No carotid bruits LYMPHATICS: No lymphadenopathy CARDIAC: Irregular rhythm variable first heart soundno murmurs, rubs, gallops RESPIRATORY:  Clear to auscultation without rales, wheezing or rhonchi  ABDOMEN: Soft, non-tender, non-distended MUSCULOSKELETAL: 2+ ankle lower extremity bilateral pitting edema; No deformity  SKIN: Warm and dry NEUROLOGIC:  Alert and oriented x 3 PSYCHIATRIC:  Normal affect    Signed, Shirlee More, MD  11/07/2018 11:41 AM    [place]

## 2018-11-07 ENCOUNTER — Other Ambulatory Visit: Payer: Self-pay | Admitting: Cardiology

## 2018-11-07 ENCOUNTER — Encounter: Payer: Self-pay | Admitting: Cardiology

## 2018-11-07 ENCOUNTER — Ambulatory Visit (INDEPENDENT_AMBULATORY_CARE_PROVIDER_SITE_OTHER): Payer: Medicare Other | Admitting: Cardiology

## 2018-11-07 ENCOUNTER — Other Ambulatory Visit: Payer: Self-pay

## 2018-11-07 VITALS — BP 110/80 | HR 98 | Temp 97.4°F | Ht 74.0 in | Wt 164.1 lb

## 2018-11-07 DIAGNOSIS — I4819 Other persistent atrial fibrillation: Secondary | ICD-10-CM

## 2018-11-07 DIAGNOSIS — I38 Endocarditis, valve unspecified: Secondary | ICD-10-CM

## 2018-11-07 DIAGNOSIS — I509 Heart failure, unspecified: Secondary | ICD-10-CM

## 2018-11-07 DIAGNOSIS — M7989 Other specified soft tissue disorders: Secondary | ICD-10-CM | POA: Diagnosis not present

## 2018-11-07 DIAGNOSIS — R0602 Shortness of breath: Secondary | ICD-10-CM | POA: Diagnosis not present

## 2018-11-07 MED ORDER — FUROSEMIDE 20 MG PO TABS: ORAL_TABLET | ORAL | 0 refills | Status: DC

## 2018-11-07 NOTE — Patient Instructions (Signed)
Medication Instructions:  Your physician has recommended you make the following change in your medication:   INCREASE furosemide (lasix) 20 mg: Take 1 tablet daily on Monday, Wednesday, and Friday.  If you need a refill on your cardiac medications before your next appointment, please call your pharmacy.   Lab work: Your physician recommends that you return for lab work in 2 weeks: BMP, ProBNP. Please return to our office for lab work, no appointment needed. No need to fast beforehand.   If you have labs (blood work) drawn today and your tests are completely normal, you will receive your results only by: Marland Kitchen MyChart Message (if you have MyChart) OR . A paper copy in the mail If you have any lab test that is abnormal or we need to change your treatment, we will call you to review the results.  Testing/Procedures: None  Follow-Up: At [facility], you and your health needs are our priority.  As part of our continuing mission to provide you with exceptional heart care, we have created designated Provider Care Teams.  These Care Teams include your primary Cardiologist (physician) and Advanced Practice Providers (APPs -  Physician Assistants and Nurse Practitioners) who all work together to provide you with the care you need, when you need it. You will need a follow up appointment in 4 weeks.

## 2018-11-07 NOTE — Telephone Encounter (Signed)
°*  STAT* If patient is at the pharmacy, call can be transferred to refill team.   1. Which medications need to be refilled? (please list name of each medication and dose if known) Furosemide 20mg  tablets  2. Which pharmacy/location (including street and city if local pharmacy) is medication to be sent to?[facility]  3. Do they need a 30 day or 90 day supply? [place]

## 2018-11-07 NOTE — Telephone Encounter (Signed)
Rx denied as it was sent in this morning at his appointment.

## 2018-11-09 DIAGNOSIS — Z23 Encounter for immunization: Secondary | ICD-10-CM | POA: Diagnosis not present

## 2018-11-13 DIAGNOSIS — F329 Major depressive disorder, single episode, unspecified: Secondary | ICD-10-CM | POA: Diagnosis not present

## 2018-11-15 DIAGNOSIS — R634 Abnormal weight loss: Secondary | ICD-10-CM | POA: Diagnosis not present

## 2018-11-15 DIAGNOSIS — K7469 Other cirrhosis of liver: Secondary | ICD-10-CM | POA: Diagnosis not present

## 2018-11-15 DIAGNOSIS — C189 Malignant neoplasm of colon, unspecified: Secondary | ICD-10-CM | POA: Diagnosis not present

## 2018-11-27 DIAGNOSIS — K635 Polyp of colon: Secondary | ICD-10-CM | POA: Diagnosis not present

## 2018-11-27 DIAGNOSIS — I8510 Secondary esophageal varices without bleeding: Secondary | ICD-10-CM | POA: Diagnosis not present

## 2018-11-27 DIAGNOSIS — Z86010 Personal history of colonic polyps: Secondary | ICD-10-CM | POA: Diagnosis not present

## 2018-11-27 DIAGNOSIS — K317 Polyp of stomach and duodenum: Secondary | ICD-10-CM | POA: Diagnosis not present

## 2018-11-27 DIAGNOSIS — K640 First degree hemorrhoids: Secondary | ICD-10-CM | POA: Diagnosis not present

## 2018-11-27 DIAGNOSIS — Z980 Intestinal bypass and anastomosis status: Secondary | ICD-10-CM | POA: Diagnosis not present

## 2018-11-27 DIAGNOSIS — Z800 Family history of malignant neoplasm of digestive organs: Secondary | ICD-10-CM | POA: Diagnosis not present

## 2018-11-27 DIAGNOSIS — I8500 Esophageal varices without bleeding: Secondary | ICD-10-CM | POA: Diagnosis not present

## 2018-11-27 DIAGNOSIS — K5730 Diverticulosis of large intestine without perforation or abscess without bleeding: Secondary | ICD-10-CM | POA: Diagnosis not present

## 2018-11-27 DIAGNOSIS — K7460 Unspecified cirrhosis of liver: Secondary | ICD-10-CM | POA: Diagnosis not present

## 2018-11-27 DIAGNOSIS — D122 Benign neoplasm of ascending colon: Secondary | ICD-10-CM | POA: Diagnosis not present

## 2018-11-27 DIAGNOSIS — R634 Abnormal weight loss: Secondary | ICD-10-CM | POA: Diagnosis not present

## 2018-11-27 DIAGNOSIS — Z8719 Personal history of other diseases of the digestive system: Secondary | ICD-10-CM | POA: Diagnosis not present

## 2018-11-27 DIAGNOSIS — K219 Gastro-esophageal reflux disease without esophagitis: Secondary | ICD-10-CM | POA: Diagnosis not present

## 2018-11-27 DIAGNOSIS — K5140 Inflammatory polyps of colon without complications: Secondary | ICD-10-CM | POA: Diagnosis not present

## 2018-11-27 DIAGNOSIS — K259 Gastric ulcer, unspecified as acute or chronic, without hemorrhage or perforation: Secondary | ICD-10-CM | POA: Diagnosis not present

## 2018-11-27 DIAGNOSIS — Z85038 Personal history of other malignant neoplasm of large intestine: Secondary | ICD-10-CM | POA: Diagnosis not present

## 2018-11-27 DIAGNOSIS — Z1509 Genetic susceptibility to other malignant neoplasm: Secondary | ICD-10-CM | POA: Diagnosis not present

## 2018-11-27 DIAGNOSIS — Z1211 Encounter for screening for malignant neoplasm of colon: Secondary | ICD-10-CM | POA: Diagnosis not present

## 2018-12-05 ENCOUNTER — Ambulatory Visit: Payer: Medicare Other | Admitting: Family

## 2018-12-13 DIAGNOSIS — I10 Essential (primary) hypertension: Secondary | ICD-10-CM | POA: Diagnosis not present

## 2018-12-13 DIAGNOSIS — K7460 Unspecified cirrhosis of liver: Secondary | ICD-10-CM | POA: Diagnosis not present

## 2018-12-13 DIAGNOSIS — R188 Other ascites: Secondary | ICD-10-CM | POA: Diagnosis not present

## 2018-12-13 DIAGNOSIS — E7439 Other disorders of intestinal carbohydrate absorption: Secondary | ICD-10-CM | POA: Diagnosis not present

## 2018-12-13 DIAGNOSIS — Z125 Encounter for screening for malignant neoplasm of prostate: Secondary | ICD-10-CM | POA: Diagnosis not present

## 2018-12-19 DIAGNOSIS — R188 Other ascites: Secondary | ICD-10-CM | POA: Diagnosis not present

## 2018-12-19 DIAGNOSIS — K7460 Unspecified cirrhosis of liver: Secondary | ICD-10-CM | POA: Diagnosis not present

## 2018-12-20 DIAGNOSIS — K7400 Hepatic fibrosis, unspecified: Secondary | ICD-10-CM | POA: Diagnosis not present

## 2018-12-20 DIAGNOSIS — R188 Other ascites: Secondary | ICD-10-CM | POA: Diagnosis not present

## 2018-12-20 DIAGNOSIS — I10 Essential (primary) hypertension: Secondary | ICD-10-CM | POA: Diagnosis not present

## 2018-12-20 DIAGNOSIS — Z0000 Encounter for general adult medical examination without abnormal findings: Secondary | ICD-10-CM | POA: Diagnosis not present

## 2018-12-22 DIAGNOSIS — R188 Other ascites: Secondary | ICD-10-CM | POA: Diagnosis not present

## 2018-12-22 DIAGNOSIS — K7460 Unspecified cirrhosis of liver: Secondary | ICD-10-CM | POA: Diagnosis not present

## 2019-01-02 ENCOUNTER — Ambulatory Visit: Payer: Medicare Other | Admitting: Cardiology

## 2019-01-02 DIAGNOSIS — D649 Anemia, unspecified: Secondary | ICD-10-CM | POA: Diagnosis not present

## 2019-01-02 DIAGNOSIS — I8500 Esophageal varices without bleeding: Secondary | ICD-10-CM | POA: Diagnosis not present

## 2019-01-02 DIAGNOSIS — K317 Polyp of stomach and duodenum: Secondary | ICD-10-CM | POA: Diagnosis not present

## 2019-01-02 DIAGNOSIS — K7460 Unspecified cirrhosis of liver: Secondary | ICD-10-CM | POA: Diagnosis not present

## 2019-01-02 DIAGNOSIS — I8510 Secondary esophageal varices without bleeding: Secondary | ICD-10-CM | POA: Diagnosis not present

## 2019-01-02 DIAGNOSIS — Z87891 Personal history of nicotine dependence: Secondary | ICD-10-CM | POA: Diagnosis not present

## 2019-01-02 DIAGNOSIS — I10 Essential (primary) hypertension: Secondary | ICD-10-CM | POA: Diagnosis not present

## 2019-01-08 DIAGNOSIS — R8569 Abnormal cytological findings in specimens from other digestive organs and abdominal cavity: Secondary | ICD-10-CM | POA: Diagnosis not present

## 2019-01-08 DIAGNOSIS — R188 Other ascites: Secondary | ICD-10-CM | POA: Diagnosis not present

## 2019-01-08 DIAGNOSIS — K7460 Unspecified cirrhosis of liver: Secondary | ICD-10-CM | POA: Diagnosis not present

## 2019-01-12 DIAGNOSIS — K7460 Unspecified cirrhosis of liver: Secondary | ICD-10-CM | POA: Diagnosis not present

## 2019-01-12 DIAGNOSIS — R188 Other ascites: Secondary | ICD-10-CM | POA: Diagnosis not present

## 2019-01-23 DIAGNOSIS — K7460 Unspecified cirrhosis of liver: Secondary | ICD-10-CM | POA: Diagnosis not present

## 2019-01-23 DIAGNOSIS — R188 Other ascites: Secondary | ICD-10-CM | POA: Diagnosis not present

## 2019-01-30 ENCOUNTER — Ambulatory Visit: Payer: Medicare Other | Admitting: Cardiology

## 2019-01-31 DIAGNOSIS — R188 Other ascites: Secondary | ICD-10-CM | POA: Diagnosis not present

## 2019-01-31 DIAGNOSIS — K7460 Unspecified cirrhosis of liver: Secondary | ICD-10-CM | POA: Diagnosis not present

## 2019-02-20 ENCOUNTER — Ambulatory Visit: Payer: Medicare Other | Admitting: Cardiology

## 2019-02-21 ENCOUNTER — Ambulatory Visit: Payer: Medicare Other | Admitting: Neurology

## 2019-03-01 ENCOUNTER — Ambulatory Visit: Payer: Medicare Other | Admitting: Neurology

## 2019-03-06 ENCOUNTER — Telehealth: Payer: Self-pay | Admitting: Oncology

## 2019-03-06 NOTE — Telephone Encounter (Signed)
Rescheduled per 1/25 sch msg, per pt's request. Called and spoke with pt, confirmed 3/16 appt

## 2019-03-20 ENCOUNTER — Ambulatory Visit: Payer: Medicare Other | Admitting: Cardiology

## 2019-03-20 ENCOUNTER — Other Ambulatory Visit: Payer: Medicare Other

## 2019-03-20 ENCOUNTER — Ambulatory Visit: Payer: Medicare Other | Admitting: Oncology

## 2019-04-03 ENCOUNTER — Encounter: Payer: Self-pay | Admitting: Neurology

## 2019-04-04 ENCOUNTER — Other Ambulatory Visit: Payer: Self-pay

## 2019-04-04 ENCOUNTER — Encounter: Payer: Self-pay | Admitting: Neurology

## 2019-04-04 ENCOUNTER — Ambulatory Visit: Payer: Medicare Other | Admitting: Neurology

## 2019-04-04 VITALS — BP 98/66 | HR 82 | Temp 97.7°F | Ht 74.0 in | Wt 146.0 lb

## 2019-04-04 DIAGNOSIS — G20 Parkinson's disease: Secondary | ICD-10-CM

## 2019-04-04 DIAGNOSIS — I4819 Other persistent atrial fibrillation: Secondary | ICD-10-CM

## 2019-04-04 DIAGNOSIS — D701 Agranulocytosis secondary to cancer chemotherapy: Secondary | ICD-10-CM

## 2019-04-04 DIAGNOSIS — C182 Malignant neoplasm of ascending colon: Secondary | ICD-10-CM | POA: Diagnosis not present

## 2019-04-04 DIAGNOSIS — M342 Systemic sclerosis induced by drug and chemical: Secondary | ICD-10-CM

## 2019-04-04 MED ORDER — CARBIDOPA-LEVODOPA ER 50-200 MG PO TBCR: 1.0000 | EXTENDED_RELEASE_TABLET | Freq: Two times a day (BID) | ORAL | 5 refills | Status: DC

## 2019-04-04 NOTE — Patient Instructions (Signed)

## 2019-04-04 NOTE — Progress Notes (Signed)
SLEEP [facility]INE [facility]IC   Provider:  Larey Seat, M D  Primary Care Physician:  Russell Bunting, MD Referring Provider: Burnard Bunting, MD    Chief Complaint  Patient presents with  . Follow-up RN     pt alone, rm 11. pt states he has stopped using the CPAP machine due to he finds it hard to sleep at bedtime with his parkinsons. he states he feels that PD is progressing quick.     Interval history - 04-04-2019. pt states he has stopped using the CPAP machine due to he finds it hard to sleep with  , dexterity problems related to his Parkinson's disease diagnosis, and feels that PD is progressing. He has been actively trying to retain weight and levelled off losing weight. He is very underweight and stated his sister ,age 38, has had unintended weight loss too.  His gastroenterologist did a banding procedure for varicose veins in the oesophagus. He underwent a scope and a biopsy of the small intestine and supposingly this did not reveal any problem absorbing nutrition.  Liver damage and parkinson's progression have left him unstable, less resilient, but he is no longer short of breath now that the abdominal fluid has been removed, he carries a diagnosis of atrial fib, new , discovered during a GI procedure as he reports. He can't undergo ablation because of high bleeding risk.   His main neurological problem is Tremor- which I see as not having progressed - he carries a dx PD- as indicated by DAT scan. He will increase the medication dose. Change today to bid  extended release. I can understand the reluctance of accepting this diagnosis of PD-  as it was not clinically distinguished from essential tremor - or liver damage / hepatic tremor. It was the DAT can that pointed to that direction.   He is sleepy- but doesn't want to restart CPAP, he considers his fatigue multifactorial.     Interval history 08-16-2018, [name]'s DAT scan is to be discussed, it has confirmed Parkinson's Disease.    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   He is self isolating , also out of concern for his wife. He reports tolerating the qid regimen of Sinemet. He reports having trouble closing buttons, put the seatbelt in and typing. He feels he lost his fie touch sense. His wife had noted him to become more repetitive, too.  He has been a cancer survivor and is still losing weight. Question if this is a cancer side effect, a chemotherapy effect or PD . His knees buckle. He feels stiff and rigid.  His  GI specialists are actively involved in weight loss evaluation but he reports he is eating much slower, more careful and takes in overall less calories. His family cleared the table on 4th of July and he was still eating for a long time.    HPI:  RV 04-19-2018. [name] had a busy 3 month, [name] suffered a stroke. Here is our last visit plan: 1) findings of asymmetric  tremor in dominant hand and arm, arm swing loss, masked face, dysphonia and rare blinking. He is stooped. This fits Parkinson's Disease.  2) He broke his right leg and stated he hasn't walked well again- this may account for the "flat footed " gait.  3) stooped posture - that's why we ordered MRI cervical spine, too.  4) atrial fibrillation, may have  vascular component to PD.- this was not found.   He underwent swallowing examination , ST -  "Bedside" Swallow Evaluation - 04/14/18 1003            General Information   Type of Study  Bedside Swallow Evaluation    Diet Prior to this Study  Regular    Temperature Spikes Noted  No    Respiratory Status  Room air    History of Recent Intubation  No    Behavior/Cognition  Alert;Cooperative;Pleasant mood    Oral Cavity Assessment  Dry    Oral Care Completed by SLP  No    Self-Feeding Abilities  Able to feed self    Patient Positioning  Upright in chair    Baseline Vocal Quality  Low vocal intensity     Volitional Cough  Strong    Volitional Swallow  Able to elicit          Thin Liquid - 04/14/18 1004            Thin Liquid   Thin Liquid  Impaired    Presentation  Cup    Pharyngeal  Phase Impairments  --          Solid - 04/14/18 1006            Solid   Solid  Impaired    Oral Phase Impairments  Reduced lingual movement/coordination    Oral Phase Functional Implications  --    Other Comments  no pharyngeal difficulties observed        SLP Education - 04/14/18 1001    Education Details  Shaker, Effortful swallow, lingual ROM in sulci, precautions, results of modified, "hock" *and reswallow*, chin tuck not necessary,    Person(s) Educated  Patient;Spouse    Methods  Explanation;Demonstration;Verbal cues    Comprehension  Verbalized understanding;Returned demonstration;Verbal cues required;Need further instruction           SLP Short Term Goals - 04/14/18 1352            SLP SHORT TERM GOAL #1   Title  pt will demo swallow HEP with rare min A over three sessions    Time  4    Period  Weeks    Status  New        SLP SHORT TERM GOAL #2   Title  pt will demo swallow precautions with POs over three sessions    Time  4    Period  Weeks    Status  New        SLP SHORT TERM GOAL #3   Title  pt will demo expiratory/inspiratory muscle strength HEP with rare min A over three sessions    Time  4    Period  Weeks    Status  New        SLP SHORT TERM GOAL #4   Title  pt will produce 17/20 sentences with volume in low 70s dB over three sessions    Time  4    Period  Weeks    Status  New      MRI brain from 9- 19-2006 was again reviewed.  No kidney failure, a transplant surgeon has told him that the hepatic function is well. He has esophageal varices, Vena Porta pressure elevated. He doesn't have cirrhosis.  He never had a transplant.   He has pain in the bottom of both feet and fingertips. Right leg high level  of sensory loss than the left. He  has spasms, cramps in feet.   He was so busy, he has not noted if sinemet had an added benefit. Tremor is right arm and hand dominant.     Rv 12-13-2017 ,  [name] has been taking Sinemet 100/25 mg tid since last visit with me.  He is not sure if he has seen much improvement also I noticed that his right hand has a much milder resting tremor, and his wife agrees that his tremor amplitude and the fluidity of his movements have both had a positive response.  He continues to take Adderall as prescribed by psychiatry, he struggles with hearing loss, he is status post chemotherapy, he has been unable to take iron supplements because he did not want and to interfere with the carbidopa-levodopa therapy. He has a gait impairment since left femur fracture in 2015.  He has always felt that his neuropathic symptoms and his leg cramps worsened or actually set on a new after that fracture occurred.  He came home from the surgery to repair the fracture and felt as if he "slept on a bed of rocks." He has noticed some increase in leg cramps especially at night while on sinemet which I do not quite understand. He underwent MRI of the cervical spine and brain to evaluate further for Parkinson's versus spinal stenosis.    Chief complaint according to patient : "My right hand is trembling " Dr. [name] referred now for tremor evaluation.  I have the pleasure of meeting [name] 10-24-2017 , who reports that his right arm and hand has been trembling.  This is clearly visible at rest he has a pill-rolling tremor and there is actually some tremor in both upper extremities.  His deep tendon reflexes appear symmetric, there is rigor  over both shoulders and a little bit of crepitation over the left.  He lost muscle mass. He does have a decreased facial mimic, he has titubation.  No abnormal eye movements, there is some dysphonia or hoarseness of the voice noted.  [name] is a 79  y.o. male patient who was seen here previously in 2010 - as a patient of [name], MD for a sleep apnea evaluation.  The patient had mild apnea- and was already on CPAP. I referred to Neuropsychology PhD , and from there on to Psychiatry - Dr. Casimiro Needle. The patient continued to use his CPAP, but he also started on Adderall with Dr. Casimiro Needle and has been doing well for the last 6 or 7 years. Today's referral is not for sleep but for tremor evaluation.  The patient apparently carries already the diagnosis of benign essential tremor, but he has noted that his right hand and upper extremity tremor has worsened and the unilateral nature is of more concern.  He has a history of neuropathy, renal portal hypertension, anemia, reflux, thrombocytopenia, hypertension atrial fibrillation, Colon cancer dx in 2/ 2013, with liver damage through chemotherapy, a traumatic brain injury and obstructive sleep apnea.  He has undergone a colon resection in 2013-I had seen him before this diagnosis was made- and a fractured leg in 2015 , he had varicella-zoster 2018 in 2019. He had a upper gi bleed, esophageal veinous bleed with banding in 2018.  Social history: married, no smoker, non drinker. Daughter is 54- behavior analyst , son is 47. Retired from [employer] and [employer].  Caffeine : iced tea, diet sodas. 3-4 a day.    Review of Systems: Out of a complete 14 system  review, the patient complains of only the following symptoms, and all other reviewed systems are negative.  Tremor at rest , not with maneuvers.   Imaging:STUDY DATE: 11/05/2017 PATIENT NAME: [name] DOB: [birth year] MRN: [id number]  EXAM: MRI of the cervical spine without contrast  ORDERING CLINICIAN: Asencion Partridge Blas Riches MD CLINICAL HISTORY: 79 year old man with right arm tremor and parkinsonism COMPARISON FILMS: None  TECHNIQUE: MRI of the cervical spine was obtained utilizing 3 mm sagittal slices from the posterior fossa down to the  T3-4 level with T1, T2 and inversion recovery views. In addition 4 mm axial slices from [id number] down to T1-2 level were included with T2 and gradient echo views. CONTRAST: None IMAGING SITE: [facility], [place], [place], [place]  FINDINGS: :  On sagittal images, the spine is imaged from above the cervicomedullary junction to T3.   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  A cyst is noted in the skull just right of midline.  The spinal cord is of normal caliber and signal.   The vertebral bodies are normally aligned.   The vertebral bodies have normal signal.    The discs and interspaces were further evaluated on axial views from C2 to T1 as follows:  C2-C3: The disc and interspace appear normal.  C3-C4: There is mild disc bulging and uncovertebral spurring causing minimal foraminal narrowing but no nerve root compression or spinal stenosis.  C4-C5: There is mild spinal stenosis due to disc bulging, uncovertebral spurring and left greater than right facet hypertrophy.  There is moderate left greater than right foraminal narrowing but no definite nerve root compression.  C5-C6: There is disc bulging, uncovertebral spurring and minimal left facet hypertrophy.  There is mild left and minimal right foraminal narrowing but no nerve root compression.  C6-C7: There is a small central disc bulge that does not lead to spinal stenosis or nerve root compression.  C7-T1: The disc and interspace appear normal.  T1-T2: The disc and interspace appear normal.  T2-T3: This level is only evaluated on the sagittal images but there appears to be a disc protrusion distorting the thecal sac without causing spinal cord compression.  There is no nerve root compression.   IMPRESSION: This MRI of the cervical spine without contrast shows the following: 1.    The spinal cord appears normal. 2.    At C4-C5 there is mild spinal stenosis and moderate left greater  than right foraminal narrowing.  There does not appear to be nerve root compression. 3.    At T2-T3, there is a disc protrusion that distorts the thecal sac without causing spinal cord compression.  There is no nerve root compression. 4.    There are milder degenerative changes at C3-C4, C5-C6 and C6-C7 thatt do not lead to spinal stenosis or nerve root compression.     INTERPRETING PHYSICIAN:  Richard A. Felecia Shelling, MD, PhD, FAAN Certified in  Neuroimaging by [employer] IMPRESSION: This MRI of the brain with and without contrast shows the following: 1.    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   2.    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 3.    Mild to moderate generalized cortical atrophy that has progressed since the 2006 CT scan. 4.    There are no acute findings.  I showed the patient these images, which do no explain his PD  symptoms.   Social History   Socioeconomic History  . Marital status: Married    Spouse name: Not on file  . Number of children: 2  . Years of education: Not on file  . Highest education level: Not on file  Occupational History  . Occupation: Retired  Tobacco Use  . Smoking status: Former Smoker    Packs/day: 0.50    Years: 7.00    Pack years: 3.50    Types: Cigarettes    Quit date: 03/21/1961    Years since quitting: 58.0  . Smokeless tobacco: Never Used  Substance and Sexual Activity  . Alcohol use: No    Alcohol/week: 0.0 standard drinks  . Drug use: No  . Sexual activity: Yes  Other Topics Concern  . Not on file  Social History Narrative  . Not on file   Social Determinants of Health   Financial Resource Strain:   . Difficulty of Paying Living Expenses: Not on file  Food Insecurity:   . Worried About Charity fundraiser in the Last Year: Not on file  . Ran Out of Food in the Last Year: Not on file  Transportation  Needs:   . Lack of Transportation (Medical): Not on file  . Lack of Transportation (Non-Medical): Not on file  Physical Activity:   . Days of Exercise per Week: Not on file  . Minutes of Exercise per Session: Not on file  Stress:   . Feeling of Stress : Not on file  Social Connections:   . Frequency of Communication with Friends and Family: Not on file  . Frequency of Social Gatherings with Friends and Family: Not on file  . Attends Religious Services: Not on file  . Active Member of Clubs or Organizations: Not on file  . Attends Archivist Meetings: Not on file  . Marital Status: Not on file  Intimate Partner Violence:   . Fear of Current or Ex-Partner: Not on file  . Emotionally Abused: Not on file  . Physically Abused: Not on file  . Sexually Abused: Not on file    Family History  Problem Relation Age of Onset  . Diabetes Father   . Prostate cancer Father   . Heart attack Father 49  . Heart failure Mother        Congestive heart failure  . Cancer Brother        colon  . Colon cancer Brother 79  . Stomach cancer Sister   . Colon cancer Other   . Colon cancer Sister   . Coronary artery disease Brother        CABG  . Lung cancer Brother   . Anesthesia problems Neg Hx   . Esophageal cancer Neg Hx     Past Medical History:  Diagnosis Date  . Anemia   . Aortic valve disorder 07/17/2018  . Arthritis    "thumbs" (11/29/2013)  . Atrial fibrillation [facility]) 2006   Dr Tonny Bollman af-no meds  . Bleeding esophageal varices ([place])   . BPH (benign prostatic hypertrophy)   . CAD (coronary artery disease)    Mild by Cath 2004. Calcification with mild irregularity.  30% diagonal.  Cardiolite 8/13 normal EF and negative for ischemia (chronically positive ETT)   . CAP (community acquired pneumonia) 02/12/2013  . Cerebellar ataxia in diseases classified elsewhere ([place]) 10/24/2017  . Disorder of bilirubin excretion   . Diverticulosis    Sigmoid colon  . 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 Esophageal  stricture   . Esophageal varices determined by endoscopy ([place]) sept 2016  . Fibrosis of liver   . Fracture of distal femur ([place]) 11/29/2013  . GERD (gastroesophageal reflux disease) 03-31-11   tx. Omeprazole  . 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 Hernia, abdominal    at the umbilicus  . History of colon cancer 02/25/2016  . History of esophageal varices   . History of hemorrhoids   . History of irregular heartbeat   . Hypercholesterolemia   . HYPERGLYCEMIA, FASTING 12/13/2007  . Hyperlipidemia   . Malignant neoplasm of ascending colon ([place]) 03/03/2011   s/p right colectomy 04/08/11 Dr. Barkley Bruns Genetic counseling recommendations:  SURVEILLANCE:  Because of the increased risk for cancer in you and your family which we cannot yet define through genetic testing, we urge you and family members to pursue screenings that are recommended for individuals at increased risk of Lynch syndrome (HNPCC)-associated cancers. The following measures are recommend  . Maxillary sinusitis, acute 05/06/2015  . 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 Nephrolithiasis   . Neuropathy 2013   as a result of chemo  . Obstructive sleep apnea 12/14/2007  . OSA on CPAP dx'd 2010   uses cap nightly  . Oxygen deficiency   . Parkinsonism ([facility]) 10/24/2017  . Personal history of colonic polyps 09/17/2009   Tubular adenoma  . Pneumonia 01/2013  . 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 Portal hypertension with esophageal varices ([place]) 10/24/2017  . Resting tremor 10/24/2017  . Shortness of breath at rest   . Sleep apnea    uses c pap at home  . TBI (traumatic brain injury) ([facility]) 1986   S/P fall; "slow to get going q morning" (10//22/2015)  . Thrombocytopenia ([facility]) 02/25/2016  . Titubation 10/24/2017  . Upper GI bleed 02/25/2016  . Viral URI with cough  01/24/2013    Past Surgical History:  Procedure Laterality Date  . APPENDECTOMY  03/2011  . CARDIAC CATHETERIZATION  07/2002   Dr Wynonia Lawman  . COLON SURGERY  2013  . COLONOSCOPY  sept 2016   upper endo and colonoscopy  . ESOPHAGEAL DILATION  2006  . ESOPHAGOGASTRODUODENOSCOPY (EGD) WITH PROPOFOL N/A 02/25/2016   Procedure: ESOPHAGOGASTRODUODENOSCOPY (EGD) WITH PROPOFOL;  Surgeon: Manus Gunning, MD;  Location: [facility];  Service: Gastroenterology;  Laterality: N/A;  . ESOPHAGOGASTRODUODENOSCOPY (EGD) WITH PROPOFOL N/A 03/10/2016   Procedure: ESOPHAGOGASTRODUODENOSCOPY (EGD) WITH PROPOFOL;  Surgeon: Manus Gunning, MD;  Location: [facility];  Service: Gastroenterology;  Laterality: N/A;  . GASTRIC VARICES BANDING N/A 03/10/2016   Procedure: GASTRIC VARICES BANDING;  Surgeon: Manus Gunning, MD;  Location: [facility];  Service: Gastroenterology;  Laterality: N/A;  . INGUINAL HERNIA REPAIR Left ~ 1991  . LAPAROSCOPIC PARTIAL COLECTOMY  04/08/2011  . ORIF DISTAL FEMUR FRACTURE Right 11/29/2013   "put plate in"  . ORIF FEMUR FRACTURE Right 11/29/2013   Procedure: OPEN REDUCTION INTERNAL FIXATION (ORIF) RIGHT  DISTAL FEMUR FRACTURE;  Surgeon: Rozanna Box, MD;  Location: [facility];  Service: Orthopedics;  Laterality: Right;  . PORT-A-CATH REMOVAL  11/09/2011   Procedure: REMOVAL PORT-A-CATH;  Surgeon: Odis Hollingshead, MD;  Location: [facility];  Service: General;  Laterality: N/A;  . PORTACATH PLACEMENT  04/30/2011   Procedure: INSERTION PORT-A-CATH;  Surgeon: Odis Hollingshead, MD;  Location: [facility];  Service: General;  Laterality: Right;  Current Outpatient Medications  Medication Sig Dispense Refill  . amphetamine-dextroamphetamine (ADDERALL) 30 MG tablet Take 10-20 mg by mouth daily with breakfast.     . carbidopa-levodopa (SINEMET IR) 25-100 MG tablet Take 1 tablet by mouth 4 (four) times daily. 360 tablet 2  . ferrous sulfate 325 (65 FE) MG  tablet Take 325 mg by mouth 2 (two) times daily.    . furosemide (LASIX) 40 MG tablet Take 40 mg by mouth 2 (two) times daily.    . pantoprazole (PROTONIX) 40 MG tablet Take 1 tablet (40 mg total) by mouth 2 (two) times daily. 60 tablet 0  . propranolol (INDERAL) 20 MG tablet Take 1 tablet (20 mg total) by mouth 2 (two) times daily. 60 tablet 0  . spironolactone (ALDACTONE) 100 MG tablet Take 100 mg by mouth 2 (two) times daily.      No current facility-administered medications for this visit.    Allergies as of 04/04/2019  . (No Known Allergies)    Vitals: BP 98/66   Pulse 82   Temp 97.7 F (36.5 C)   Ht 6\' 2"  (1.88 m)   Wt 146 lb (66.2 kg)   BMI 18.75 kg/m  Last Weight:  Wt Readings from Last 1 Encounters:  04/04/19 146 lb (66.2 kg)   [id number] mass index is 18.75 kg/m.     Last Height:   Ht Readings from Last 1 Encounters:  04/04/19 6\' 2"  (1.88 m)    Physical exam:  General: The patient is awake, alert and appears not in acute distress.  The patient has a stooped position. He has a low volume - hoarse voice with little cadence.  Head: Normocephalic, atraumatic.  Neck is supple. Mallampati 2. His movements are slowed.  neck circumference:15. Nasal airflow patent, titubation is seen.    Cardiovascular:  Regular rate and rhythm, without  murmurs or carotid bruit, and without distended neck veins.Respiratory: Lungs are clear to auscultation. Skin:  Without evidence of edema, or rash. Trunk: BMI is 18.75 down from 22.7 .  Neurologic exam : The patient is awake and alert, oriented to place and time.   Memory subjective described as intact. Attention span & concentration ability appears normal.   Speech is fluent,  with more dysphonia/ He reports  improved dysphagia.   Mood and affect are appropriate.  Cranial nerves: Pupils are equal and briskly reactive to light. Funduscopic exam without evidence of pallor or edema. Rare blinking. Extraocular movements in vertical and  horizontal planes intact . Visual fields by finger perimetry are intact. Hearing to finger rub impaired - forgot hearing aids today . Facial sensation intact to fine touch.  Facial expression remains  masked.  Motor strength is symmetric- but declined-  tongue and uvula move midline.   Tongue is not tremolous.  Shoulder shrug was symmetrical.   Motor exam:  increased tone without cog wheeling, reduced  muscle bulk  ( very slim ) and symmetric strength in all extremities. Sensory:  Fine touch, pinprick and vibration were tested in all extremities.  There is no preserved sensation to vibration in either foot or ankle.    At the knee there is a reduced ability to feel vibration, the sensation is preserved in fingers hands and upper extremity.  Proprioception tested in the upper extremities was normal. Coordination: Rapid alternating movements in the fingers/hands was slowed - Finger-to-nose maneuver  normal without evidence of ataxia, dysmetria or tremor !  Gait and station: Patient walks without assistive device , his  right leg appears stiff and shorter , the foot drops  .Tandem gait is unfragmented. He turns with 4-5 Steps.  He does not stride, walks stooped but is not shuffling. Loss of  Right arm swing and noted severe pill rolling tremor on the right hand .  Romberg testing: unsteady stance with closed eyes.    Assessment:  After physical and neurologic examination, review of laboratory studies,  Personal review of imaging studies, reports of other /same  Imaging studies, results of polysomnography and / or neurophysiology testing and pre-existing records as far as provided in visit., my assessment is:   1) PD diagnosis confirmed by DAT,  Patient had clinically a picture that could relate also to hepatic or essential tremor.   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 He added a protein shake each morning. He completed speech and swallowing  therapy. BMI is 18.75 in clothes, severely malnourished appearance.  3) portal hypertension from chemo- Esophageal varicies. GI follows. Liver damage, ascitis, cancer survivor.  He completed chemotherapy in 2013.  3) neuropathy- This began after chemotherapy 2012.  4) OSA on CPAP. FFM user.  He discontinued it's use.  5)new atrial fibrillation - on chronic anticoagulation while having liver impairment   I spent more than 25 minutes of face to face time with the patient.  Greater than 50% of time was spent in counseling and coordination of care. We have discussed the diagnosis and differential and I answered the patient's questions.    Plan:  Treatment plan and additional workup :  Sinemet - carbi dopa leveo-dopa-  will change to extended release , to be taken bid - one hour before a meal.  Patient would like to d/c CPAP use.  Rv in 6 month with NP.  Please obtain / review Epworth and gait examination. Need to see response to extended release  Sinemet. Bid.    Larey Seat, MD [id number], [id number] PM  Certified in Neurology by ABPN Certified in [place] by [facility] [address], [place], [place] [address]

## 2019-04-11 DIAGNOSIS — F329 Major depressive disorder, single episode, unspecified: Secondary | ICD-10-CM | POA: Diagnosis not present

## 2019-04-12 ENCOUNTER — Telehealth: Payer: Self-pay | Admitting: Neurology

## 2019-04-12 NOTE — Telephone Encounter (Signed)
Received a call from [facility] for the pt to complete a Tier exception for the patient carbiodopa levodopa CR. Completed over the phone and will wait for their decision.

## 2019-04-16 NOTE — Telephone Encounter (Signed)
PA approved through [facility] for the [place] exception for his medication.  Approved until 04/09/2020

## 2019-04-20 ENCOUNTER — Telehealth: Payer: Self-pay | Admitting: Oncology

## 2019-04-20 DIAGNOSIS — H524 Presbyopia: Secondary | ICD-10-CM | POA: Diagnosis not present

## 2019-04-20 NOTE — Telephone Encounter (Signed)
R/s appt per 3/12 sch msg - left message with appt date and time

## 2019-04-24 ENCOUNTER — Inpatient Hospital Stay: Payer: Medicare Other

## 2019-04-24 ENCOUNTER — Inpatient Hospital Stay: Payer: Medicare Other | Admitting: Oncology

## 2019-05-02 DIAGNOSIS — K7460 Unspecified cirrhosis of liver: Secondary | ICD-10-CM | POA: Diagnosis not present

## 2019-05-02 DIAGNOSIS — R188 Other ascites: Secondary | ICD-10-CM | POA: Diagnosis not present

## 2019-05-02 DIAGNOSIS — C189 Malignant neoplasm of colon, unspecified: Secondary | ICD-10-CM | POA: Diagnosis not present

## 2019-05-08 ENCOUNTER — Ambulatory Visit: Payer: Medicare Other | Admitting: Cardiology

## 2019-05-09 DIAGNOSIS — R188 Other ascites: Secondary | ICD-10-CM | POA: Diagnosis not present

## 2019-05-09 DIAGNOSIS — K7460 Unspecified cirrhosis of liver: Secondary | ICD-10-CM | POA: Diagnosis not present

## 2019-05-09 DIAGNOSIS — C189 Malignant neoplasm of colon, unspecified: Secondary | ICD-10-CM | POA: Diagnosis not present

## 2019-05-18 ENCOUNTER — Telehealth: Payer: Self-pay | Admitting: *Deleted

## 2019-05-18 NOTE — Telephone Encounter (Signed)
Sent office notes to [facility] as requested for insurance purposes to [phone]

## 2019-06-02 ENCOUNTER — Encounter: Payer: Self-pay | Admitting: Cardiology

## 2019-06-13 DIAGNOSIS — Z85828 Personal history of other malignant neoplasm of skin: Secondary | ICD-10-CM | POA: Diagnosis not present

## 2019-06-13 DIAGNOSIS — D1801 Hemangioma of skin and subcutaneous tissue: Secondary | ICD-10-CM | POA: Diagnosis not present

## 2019-06-13 DIAGNOSIS — L821 Other seborrheic keratosis: Secondary | ICD-10-CM | POA: Diagnosis not present

## 2019-06-13 DIAGNOSIS — L570 Actinic keratosis: Secondary | ICD-10-CM | POA: Diagnosis not present

## 2019-06-13 DIAGNOSIS — C44311 Basal cell carcinoma of skin of nose: Secondary | ICD-10-CM | POA: Diagnosis not present

## 2019-06-13 DIAGNOSIS — L720 Epidermal cyst: Secondary | ICD-10-CM | POA: Diagnosis not present

## 2019-06-18 DIAGNOSIS — J302 Other seasonal allergic rhinitis: Secondary | ICD-10-CM | POA: Diagnosis not present

## 2019-06-18 DIAGNOSIS — R7301 Impaired fasting glucose: Secondary | ICD-10-CM | POA: Diagnosis not present

## 2019-06-18 DIAGNOSIS — I10 Essential (primary) hypertension: Secondary | ICD-10-CM | POA: Diagnosis not present

## 2019-06-18 DIAGNOSIS — R634 Abnormal weight loss: Secondary | ICD-10-CM | POA: Diagnosis not present

## 2019-07-10 DIAGNOSIS — Z85828 Personal history of other malignant neoplasm of skin: Secondary | ICD-10-CM | POA: Diagnosis not present

## 2019-07-10 DIAGNOSIS — C44311 Basal cell carcinoma of skin of nose: Secondary | ICD-10-CM | POA: Diagnosis not present

## 2019-07-12 ENCOUNTER — Ambulatory Visit: Payer: Medicare Other | Admitting: Cardiology

## 2019-07-18 ENCOUNTER — Ambulatory Visit: Payer: Medicare Other | Admitting: Cardiology

## 2019-07-24 ENCOUNTER — Inpatient Hospital Stay: Payer: Medicare Other

## 2019-07-24 ENCOUNTER — Inpatient Hospital Stay: Payer: Medicare Other | Admitting: Oncology

## 2019-07-30 DIAGNOSIS — Z85038 Personal history of other malignant neoplasm of large intestine: Secondary | ICD-10-CM | POA: Diagnosis not present

## 2019-07-30 DIAGNOSIS — D649 Anemia, unspecified: Secondary | ICD-10-CM | POA: Diagnosis not present

## 2019-07-30 DIAGNOSIS — I8500 Esophageal varices without bleeding: Secondary | ICD-10-CM | POA: Diagnosis not present

## 2019-07-30 DIAGNOSIS — Z9221 Personal history of antineoplastic chemotherapy: Secondary | ICD-10-CM | POA: Diagnosis not present

## 2019-07-30 DIAGNOSIS — K317 Polyp of stomach and duodenum: Secondary | ICD-10-CM | POA: Diagnosis not present

## 2019-07-30 DIAGNOSIS — K219 Gastro-esophageal reflux disease without esophagitis: Secondary | ICD-10-CM | POA: Diagnosis not present

## 2019-07-30 DIAGNOSIS — I4891 Unspecified atrial fibrillation: Secondary | ICD-10-CM | POA: Diagnosis not present

## 2019-07-30 DIAGNOSIS — Z8719 Personal history of other diseases of the digestive system: Secondary | ICD-10-CM | POA: Diagnosis not present

## 2019-07-30 DIAGNOSIS — D696 Thrombocytopenia, unspecified: Secondary | ICD-10-CM | POA: Diagnosis not present

## 2019-07-30 DIAGNOSIS — R109 Unspecified abdominal pain: Secondary | ICD-10-CM | POA: Diagnosis not present

## 2019-07-30 DIAGNOSIS — Z9049 Acquired absence of other specified parts of digestive tract: Secondary | ICD-10-CM | POA: Diagnosis not present

## 2019-07-30 DIAGNOSIS — K7460 Unspecified cirrhosis of liver: Secondary | ICD-10-CM | POA: Diagnosis not present

## 2019-07-30 DIAGNOSIS — I8510 Secondary esophageal varices without bleeding: Secondary | ICD-10-CM | POA: Diagnosis not present

## 2019-07-30 DIAGNOSIS — I10 Essential (primary) hypertension: Secondary | ICD-10-CM | POA: Diagnosis not present

## 2019-08-22 ENCOUNTER — Ambulatory Visit: Payer: Medicare Other | Admitting: Cardiology

## 2019-08-27 DIAGNOSIS — E1169 Type 2 diabetes mellitus with other specified complication: Secondary | ICD-10-CM | POA: Diagnosis not present

## 2019-08-27 DIAGNOSIS — G20 Parkinson's disease: Secondary | ICD-10-CM | POA: Diagnosis not present

## 2019-08-27 DIAGNOSIS — D689 Coagulation defect, unspecified: Secondary | ICD-10-CM | POA: Diagnosis not present

## 2019-08-27 DIAGNOSIS — G629 Polyneuropathy, unspecified: Secondary | ICD-10-CM | POA: Diagnosis not present

## 2019-09-03 DIAGNOSIS — R0982 Postnasal drip: Secondary | ICD-10-CM | POA: Diagnosis not present

## 2019-09-03 DIAGNOSIS — J310 Chronic rhinitis: Secondary | ICD-10-CM | POA: Diagnosis not present

## 2019-09-03 DIAGNOSIS — H6121 Impacted cerumen, right ear: Secondary | ICD-10-CM | POA: Diagnosis not present

## 2019-09-04 ENCOUNTER — Telehealth: Payer: Self-pay | Admitting: Oncology

## 2019-09-04 NOTE — Telephone Encounter (Signed)
Rescheduled appts per print out with hand written instructions from [facility]. Left voicemail with cancellation and new appt details.

## 2019-09-06 ENCOUNTER — Other Ambulatory Visit: Payer: Self-pay | Admitting: Neurology

## 2019-09-13 ENCOUNTER — Other Ambulatory Visit: Payer: Medicare Other

## 2019-09-13 ENCOUNTER — Ambulatory Visit: Payer: Medicare Other | Admitting: Oncology

## 2019-09-19 DIAGNOSIS — F329 Major depressive disorder, single episode, unspecified: Secondary | ICD-10-CM | POA: Diagnosis not present

## 2019-09-21 ENCOUNTER — Other Ambulatory Visit: Payer: Self-pay | Admitting: Neurology

## 2019-09-24 ENCOUNTER — Telehealth: Payer: Self-pay | Admitting: Neurology

## 2019-09-24 MED ORDER — CARBIDOPA-LEVODOPA ER 50-200 MG PO TBCR: EXTENDED_RELEASE_TABLET | ORAL | 1 refills | Status: DC

## 2019-09-24 NOTE — Addendum Note (Signed)
Addended by: Darleen Crocker on: 09/24/2019 02:21 PM   Modules accepted: Orders

## 2019-09-24 NOTE — Telephone Encounter (Signed)
Pt is asking for a call from RN to discuss him getting his 3 month supply of carbidopa-levodopa (SINEMET CR) 50-200 MG tablet (180 tablets) the pharmacy is not willing to fill, please call pt to discuss

## 2019-09-24 NOTE — Telephone Encounter (Signed)
Called the patient back informed him that we had sent the refill but can resend for a 90 day supply. Pt should be able to get it through pharmacy. Order resent

## 2019-09-27 DIAGNOSIS — R0982 Postnasal drip: Secondary | ICD-10-CM | POA: Diagnosis not present

## 2019-09-27 DIAGNOSIS — J310 Chronic rhinitis: Secondary | ICD-10-CM | POA: Diagnosis not present

## 2019-09-27 DIAGNOSIS — H6121 Impacted cerumen, right ear: Secondary | ICD-10-CM | POA: Diagnosis not present

## 2019-09-27 DIAGNOSIS — R490 Dysphonia: Secondary | ICD-10-CM | POA: Diagnosis not present

## 2019-10-05 ENCOUNTER — Inpatient Hospital Stay: Payer: Medicare Other | Admitting: Oncology

## 2019-10-05 ENCOUNTER — Inpatient Hospital Stay: Payer: Medicare Other

## 2019-10-10 ENCOUNTER — Other Ambulatory Visit: Payer: Self-pay

## 2019-10-10 ENCOUNTER — Ambulatory Visit: Payer: Medicare Other | Admitting: Adult Health

## 2019-10-10 VITALS — BP 110/64 | HR 74 | Ht 74.0 in | Wt 146.0 lb

## 2019-10-10 DIAGNOSIS — G20 Parkinson's disease: Secondary | ICD-10-CM

## 2019-10-10 NOTE — Progress Notes (Signed)
PATIENT: [name] DOB: [birth year]  REASON FOR VISIT: follow up HISTORY FROM: patient  HISTORY OF PRESENT ILLNESS: Today 10/10/19: [name] is a 79 year old male with a history of Parkinson's disease.  He returns today for follow-up.  At the last visit Sinemet was switched to controlled release.  He states that this has been working well for him.  Continues to have a tremor in the right upper extremity.  States that the severity of the tremor varies.  He denies any significant changes with his gait or balance.  No falls.  He does not use an assistive device.  He lives at home with his wife.  Able to complete all ADLs independently.  He takes care of his wife.  He reports that he is a slow eater.  Takes him typically 1-1/2 hours to eat.  In the past he was on CPAP and was compliant but with Parkinson's his ability to maintain sleep has been affected therefore he discontinued CPAP use.  HISTORY (Copied from Dr.Dohmeier's note)  04-04-2019. pt states he has stopped using the CPAP machine due to he finds it hard to sleep with  , dexterity problems related to his Parkinson's disease diagnosis, and feels that PD is progressing. He has been actively trying to retain weight and levelled off losing weight. He is very underweight and stated his sister ,age 57, has had unintended weight loss too.  His gastroenterologist did a banding procedure for varicose veins in the oesophagus. He underwent a scope and a biopsy of the small intestine and supposingly this did not reveal any problem absorbing nutrition.  Liver damage and parkinson's progression have left him unstable, less resilient, but he is no longer short of breath now that the abdominal fluid has been removed, he carries a diagnosis of atrial fib, new , discovered during a GI procedure as he reports. He can't undergo ablation because of high bleeding risk.   His main neurological problem is Tremor- which I see as not having progressed - he  carries a dx PD- as indicated by DAT scan. He will increase the medication dose. Change today to bid  extended release. I can understand the reluctance of accepting this diagnosis of PD-  as it was not clinically distinguished from essential tremor - or liver damage / hepatic tremor. It was the DAT can that pointed to that direction.   He is sleepy- but doesn't want to restart CPAP, he considers his fatigue multifactorial.    REVIEW OF SYSTEMS: Out of a complete 14 system review of symptoms, the patient complains only of the following symptoms, and all other reviewed systems are negative.  See HPI  ALLERGIES: No Known Allergies  HOME MEDICATIONS: Outpatient Medications Prior to Visit  Medication Sig Dispense Refill  . amphetamine-dextroamphetamine (ADDERALL) 30 MG tablet Take 10-20 mg by mouth daily with breakfast.     . carbidopa-levodopa (SINEMET CR) 50-200 MG tablet TAKE 1 TABLET BY MOUTH TWICE DAILY 1 HOUR BEFORE BREAKFAST AND BEFORE DINNER 180 tablet 1  . ferrous sulfate 325 (65 FE) MG tablet Take 325 mg by mouth 2 (two) times daily.    . furosemide (LASIX) 40 MG tablet Take 40 mg by mouth 2 (two) times daily.    . pantoprazole (PROTONIX) 40 MG tablet Take 1 tablet (40 mg total) by mouth 2 (two) times daily. 60 tablet 0  . propranolol (INDERAL) 20 MG tablet Take 1 tablet (20 mg total) by mouth 2 (two) times daily. 60 tablet  0  . spironolactone (ALDACTONE) 100 MG tablet Take 100 mg by mouth 2 (two) times daily.      No facility-administered medications prior to visit.    PAST MEDICAL HISTORY: Past Medical History:  Diagnosis Date  . Anemia   . Aortic valve disorder 07/17/2018  . Arthritis    "thumbs" (11/29/2013)  . Atrial fibrillation [facility]) 2006   Dr Tonny Bollman af-no meds  . Bleeding esophageal varices ([place])   . BPH (benign prostatic hypertrophy)   . CAD (coronary artery disease)    Mild by Cath 2004. Calcification with mild irregularity.  30% diagonal.  Cardiolite 8/13  normal EF and negative for ischemia (chronically positive ETT)   . CAP (community acquired pneumonia) 02/12/2013  . Cerebellar ataxia in diseases classified elsewhere ([place]) 10/24/2017  . Disorder of bilirubin excretion   . Diverticulosis    Sigmoid colon  . 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 Esophageal stricture   . Esophageal varices determined by endoscopy ([place]) sept 2016  . Fibrosis of liver   . Fracture of distal femur ([place]) 11/29/2013  . GERD (gastroesophageal reflux disease) 03-31-11   tx. Omeprazole  . 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 Hernia, abdominal    at the umbilicus  . History of colon cancer 02/25/2016  . History of esophageal varices   . History of hemorrhoids   . History of irregular heartbeat   . Hypercholesterolemia   . HYPERGLYCEMIA, FASTING 12/13/2007  . Hyperlipidemia   . Malignant neoplasm of ascending colon ([place]) 03/03/2011   s/p right colectomy 04/08/11 Dr. Barkley Bruns Genetic counseling recommendations:  SURVEILLANCE:  Because of the increased risk for cancer in you and your family which we cannot yet define through genetic testing, we urge you and family members to pursue screenings that are recommended for individuals at increased risk of Lynch syndrome (HNPCC)-associated cancers. The following measures are recommend  . Maxillary sinusitis, acute 05/06/2015  . 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 Nephrolithiasis   . Neuropathy 2013   as a result of chemo  . Obstructive sleep apnea 12/14/2007  . OSA on CPAP dx'd 2010   uses cap nightly  . Oxygen deficiency   . Parkinsonism ([place]) 10/24/2017  . Personal history of colonic polyps 09/17/2009   Tubular adenoma  . Pneumonia 01/2013  . 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 Portal hypertension with esophageal varices ([place]) 10/24/2017  . Resting tremor 10/24/2017  .  Shortness of breath at rest   . Sleep apnea    uses c pap at home  . TBI (traumatic brain injury) ([place]) 1986   S/P fall; "slow to get going q morning" (10//22/2015)  . Thrombocytopenia ([place]) 02/25/2016  . Titubation 10/24/2017  . Upper GI bleed 02/25/2016  . Viral URI with cough 01/24/2013    PAST SURGICAL HISTORY: Past Surgical History:  Procedure Laterality Date  . APPENDECTOMY  03/2011  . CARDIAC CATHETERIZATION  07/2002   Dr Wynonia Lawman  . COLON SURGERY  2013  . COLONOSCOPY  sept 2016   upper endo and colonoscopy  . ESOPHAGEAL DILATION  2006  . ESOPHAGOGASTRODUODENOSCOPY (EGD) WITH PROPOFOL N/A 02/25/2016   Procedure: ESOPHAGOGASTRODUODENOSCOPY (EGD) WITH PROPOFOL;  Surgeon: Manus Gunning, MD;  Location: [facility];  Service: Gastroenterology;  Laterality: N/A;  . ESOPHAGOGASTRODUODENOSCOPY (EGD) WITH PROPOFOL N/A 03/10/2016   Procedure: ESOPHAGOGASTRODUODENOSCOPY (EGD) WITH PROPOFOL;  Surgeon: Renelda Loma Armbruster,  MD;  Location: [facility];  Service: Gastroenterology;  Laterality: N/A;  . GASTRIC VARICES BANDING N/A 03/10/2016   Procedure: GASTRIC VARICES BANDING;  Surgeon: Manus Gunning, MD;  Location: [facility];  Service: Gastroenterology;  Laterality: N/A;  . INGUINAL HERNIA REPAIR Left ~ 1991  . LAPAROSCOPIC PARTIAL COLECTOMY  04/08/2011  . ORIF DISTAL FEMUR FRACTURE Right 11/29/2013   "put plate in"  . ORIF FEMUR FRACTURE Right 11/29/2013   Procedure: OPEN REDUCTION INTERNAL FIXATION (ORIF) RIGHT  DISTAL FEMUR FRACTURE;  Surgeon: Rozanna Box, MD;  Location: [facility];  Service: Orthopedics;  Laterality: Right;  . PORT-A-CATH REMOVAL  11/09/2011   Procedure: REMOVAL PORT-A-CATH;  Surgeon: Odis Hollingshead, MD;  Location: [facility];  Service: General;  Laterality: N/A;  . PORTACATH PLACEMENT  04/30/2011   Procedure: INSERTION PORT-A-CATH;  Surgeon: Odis Hollingshead, MD;  Location: [facility];  Service: General;  Laterality: Right;    FAMILY  HISTORY: Family History  Problem Relation Age of Onset  . Diabetes Father   . Prostate cancer Father   . Heart attack Father 48  . Heart failure Mother        Congestive heart failure  . Cancer Brother        colon  . Colon cancer Brother 41  . Stomach cancer Sister   . Colon cancer Other   . Colon cancer Sister   . Coronary artery disease Brother        CABG  . Lung cancer Brother   . Anesthesia problems Neg Hx   . Esophageal cancer Neg Hx     SOCIAL HISTORY: Social History   Socioeconomic History  . Marital status: Married    Spouse name: Not on file  . Number of children: 2  . Years of education: Not on file  . Highest education level: Not on file  Occupational History  . Occupation: Retired  Tobacco Use  . Smoking status: Former Smoker    Packs/day: 0.50    Years: 7.00    Pack years: 3.50    Types: Cigarettes    Quit date: 03/21/1961    Years since quitting: 58.5  . Smokeless tobacco: Never Used  Vaping Use  . Vaping Use: Never used  Substance and Sexual Activity  . Alcohol use: No    Alcohol/week: 0.0 standard drinks  . Drug use: No  . Sexual activity: Yes  Other Topics Concern  . Not on file  Social History Narrative  . Not on file   Social Determinants of Health   Financial Resource Strain:   . Difficulty of Paying Living Expenses: Not on file  Food Insecurity:   . Worried About Charity fundraiser in the Last Year: Not on file  . Ran Out of Food in the Last Year: Not on file  Transportation Needs:   . Lack of Transportation (Medical): Not on file  . Lack of Transportation (Non-Medical): Not on file  Physical Activity:   . Days of Exercise per Week: Not on file  . Minutes of Exercise per Session: Not on file  Stress:   . Feeling of Stress : Not on file  Social Connections:   . Frequency of Communication with Friends and Family: Not on file  . Frequency of Social Gatherings with Friends and Family: Not on file  . Attends Religious Services:  Not on file  . Active Member of Clubs or Organizations: Not on file  . Attends Archivist Meetings:  Not on file  . Marital Status: Not on file  Intimate Partner Violence:   . Fear of Current or Ex-Partner: Not on file  . Emotionally Abused: Not on file  . Physically Abused: Not on file  . Sexually Abused: Not on file      PHYSICAL EXAM  Vitals:   10/10/19 1331  BP: 110/64  Pulse: 74  Weight: 146 lb (66.2 kg)  Height: 6\' 2"  (1.88 m)   Body mass index is 18.75 kg/m.  Generalized: Well developed, in no acute distress   Neurological examination  Mentation: Alert oriented to time, place, history taking. Follows all commands speech and language fluent Cranial nerve II-XII: Pupils were equal round reactive to light. Extraocular movements were full, visual field were full on confrontational test. Head turning and shoulder shrug  were normal and symmetric. Motor: The motor testing reveals 5 over 5 strength of all 4 extremities. Good symmetric motor tone is noted throughout.  Resting tremor in the right upper extremity Sensory: Sensory testing is intact to soft touch on all 4 extremities. No evidence of extinction is noted.  Coordination: Cerebellar testing reveals good finger-nose-finger and heel-to-shin bilaterally.  Gait and station: Patient is able to stand from a sitting position.  Slightly shuffling gait.  Decreased arm swing.  Tandem gait not attempted. Reflexes: Deep tendon reflexes are symmetric and normal bilaterally.   DIAGNOSTIC DATA (LABS, IMAGING, TESTING) - I reviewed patient records, labs, notes, testing and imaging myself where available.  Lab Results  Component Value Date   WBC 4.6 02/21/2018   HGB 14.3 02/21/2018   HCT 41.0 02/21/2018   MCV 85.6 02/21/2018   PLT 105 (L) 02/21/2018      Component Value Date/Time   NA 140 09/19/2018 1419   NA 142 06/12/2014 1003   K 4.3 09/19/2018 1419   K 4.4 06/12/2014 1003   CL 102 09/19/2018 1419   CL 107  05/05/2012 1104   CO2 23 09/19/2018 1419   CO2 25 06/12/2014 1003   GLUCOSE 150 (H) 09/19/2018 1419   GLUCOSE 168 (H) 02/27/2016 0511   GLUCOSE 133 06/12/2014 1003   GLUCOSE 117 (H) 05/05/2012 1104   BUN 15 09/19/2018 1419   BUN 11.6 06/12/2014 1003   CREATININE 1.00 09/19/2018 1419   CREATININE 1.0 06/12/2014 1003   CALCIUM 9.5 09/19/2018 1419   CALCIUM 9.4 06/12/2014 1003   PROT 6.5 10/24/2017 1511   PROT 7.4 06/12/2014 1003   ALBUMIN 4.0 10/24/2017 1511   ALBUMIN 4.0 06/12/2014 1003   AST 18 10/24/2017 1511   AST 15 06/12/2014 1003   ALT 23 10/24/2017 1511   ALT 21 06/12/2014 1003   ALKPHOS 109 10/24/2017 1511   ALKPHOS 131 06/12/2014 1003   BILITOT 1.1 10/24/2017 1511   BILITOT 0.78 06/12/2014 1003   GFRNONAA 73 09/19/2018 1419   GFRAA 84 09/19/2018 1419   Lab Results  Component Value Date   CHOL 127 04/10/2014   HDL 46.20 04/10/2014   LDLCALC 67 04/10/2014   TRIG 71.0 04/10/2014   CHOLHDL 3 04/10/2014   Lab Results  Component Value Date   HGBA1C 5.0 12/16/2008   Lab Results  Component Value Date   VITAMINB12 255 01/19/2011   Lab Results  Component Value Date   TSH 1.54 04/10/2014      ASSESSMENT AND PLAN 79 y.o. year old male  has a past medical history of Anemia, Aortic valve disorder (07/17/2018), Arthritis, Atrial fibrillation ([place]) (2006), Bleeding esophageal varices ([place]), BPH (benign  prostatic hypertrophy), CAD (coronary artery disease), CAP (community acquired pneumonia) (02/12/2013), Cerebellar ataxia in diseases classified elsewhere ([place]) (10/24/2017), Disorder of bilirubin excretion, Diverticulosis, ERECTILE DYSFUNCTION, 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), Esophageal stricture, Esophageal varices determined by endoscopy ([place]) (sept 2016), Fibrosis of liver, Fracture of distal femur ([place]) (11/29/2013), GERD (gastroesophageal reflux disease) (03-31-11), 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), Hernia, abdominal, History of colon cancer (02/25/2016),  History of esophageal varices, History of hemorrhoids, History of irregular heartbeat, Hypercholesterolemia, HYPERGLYCEMIA, FASTING (12/13/2007), Hyperlipidemia, Malignant neoplasm of ascending colon ([place]) (03/03/2011), Maxillary sinusitis, acute (05/06/2015), Mixed hearing loss (12/24/2008), Nephrolithiasis, Neuropathy (2013), Obstructive sleep apnea (12/14/2007), OSA on CPAP (dx'd 2010), Oxygen deficiency, Parkinsonism ([place]) (10/24/2017), Personal history of colonic polyps (09/17/2009), Pneumonia (01/2013), Portal hypertension ([place]), Portal hypertension with esophageal varices ([place]) (10/24/2017), Resting tremor (10/24/2017), Shortness of breath at rest, Sleep apnea, TBI (traumatic brain injury) ([place]) (1986), Thrombocytopenia ([place]) (02/25/2016), Titubation (10/24/2017), Upper GI bleed (02/25/2016), and Viral URI with cough (01/24/2013). here with:  Parkinson's disease   Continue Sinemet CR   Continue to monitor symptoms   Advised if symptoms worsen or he develops new symptoms he should let us know  Follow-up in 6 months or sooner if needed    I spent 25 minutes of face-to-face and non-face-to-face time with patient.  This included previsit chart review, lab review, study review, order entry, electronic health record documentation, patient education.  Ward Givens, MSN, NP-C 10/10/2019, 1:33 PM [facility] [address] [place], [place] [address] [phone]

## 2019-10-10 NOTE — Patient Instructions (Signed)
Your Plan:  Continue Sinemet If your symptoms worsen or you develop new symptoms please let us know.   Thank you for coming to see us at [facility]. I hope we have been able to provide you high quality care today.  You may receive a patient satisfaction survey over the next few weeks. We would appreciate your feedback and comments so that we may continue to improve ourselves and the health of our patients.  

## 2019-10-22 ENCOUNTER — Ambulatory Visit: Payer: Medicare Other | Admitting: Cardiology

## 2019-11-01 ENCOUNTER — Telehealth: Payer: Self-pay | Admitting: Oncology

## 2019-11-01 NOTE — Telephone Encounter (Signed)
Rescheduled appointment per provider pal schedule. Called patient, no answer. Left message for patient with updated appointment information.

## 2019-11-19 DIAGNOSIS — K7460 Unspecified cirrhosis of liver: Secondary | ICD-10-CM | POA: Diagnosis not present

## 2019-11-29 ENCOUNTER — Other Ambulatory Visit: Payer: Medicare Other

## 2019-11-29 ENCOUNTER — Inpatient Hospital Stay: Payer: Medicare Other | Admitting: Nurse Practitioner

## 2019-11-29 ENCOUNTER — Ambulatory Visit: Payer: Medicare Other | Admitting: Oncology

## 2019-11-29 ENCOUNTER — Inpatient Hospital Stay: Payer: Medicare Other

## 2019-12-04 DIAGNOSIS — H6121 Impacted cerumen, right ear: Secondary | ICD-10-CM | POA: Diagnosis not present

## 2019-12-04 DIAGNOSIS — H903 Sensorineural hearing loss, bilateral: Secondary | ICD-10-CM | POA: Diagnosis not present

## 2019-12-19 ENCOUNTER — Ambulatory Visit: Payer: Medicare Other | Admitting: Cardiology

## 2019-12-19 DIAGNOSIS — H6121 Impacted cerumen, right ear: Secondary | ICD-10-CM | POA: Diagnosis not present

## 2019-12-19 DIAGNOSIS — E785 Hyperlipidemia, unspecified: Secondary | ICD-10-CM | POA: Diagnosis not present

## 2019-12-19 DIAGNOSIS — H903 Sensorineural hearing loss, bilateral: Secondary | ICD-10-CM | POA: Diagnosis not present

## 2019-12-19 DIAGNOSIS — E1169 Type 2 diabetes mellitus with other specified complication: Secondary | ICD-10-CM | POA: Diagnosis not present

## 2019-12-26 DIAGNOSIS — E1169 Type 2 diabetes mellitus with other specified complication: Secondary | ICD-10-CM | POA: Diagnosis not present

## 2019-12-26 DIAGNOSIS — I10 Essential (primary) hypertension: Secondary | ICD-10-CM | POA: Diagnosis not present

## 2019-12-26 DIAGNOSIS — G20 Parkinson's disease: Secondary | ICD-10-CM | POA: Diagnosis not present

## 2019-12-26 DIAGNOSIS — Z0000 Encounter for general adult medical examination without abnormal findings: Secondary | ICD-10-CM | POA: Diagnosis not present

## 2019-12-26 DIAGNOSIS — R82998 Other abnormal findings in urine: Secondary | ICD-10-CM | POA: Diagnosis not present

## 2019-12-28 ENCOUNTER — Other Ambulatory Visit: Payer: Self-pay | Admitting: Neurology

## 2020-01-02 ENCOUNTER — Other Ambulatory Visit: Payer: Self-pay | Admitting: *Deleted

## 2020-01-02 DIAGNOSIS — Z85038 Personal history of other malignant neoplasm of large intestine: Secondary | ICD-10-CM

## 2020-01-07 ENCOUNTER — Other Ambulatory Visit: Payer: Self-pay

## 2020-01-07 ENCOUNTER — Telehealth: Payer: Self-pay | Admitting: Oncology

## 2020-01-07 ENCOUNTER — Inpatient Hospital Stay: Payer: Medicare Other | Attending: Oncology

## 2020-01-07 ENCOUNTER — Inpatient Hospital Stay (HOSPITAL_BASED_OUTPATIENT_CLINIC_OR_DEPARTMENT_OTHER): Payer: Medicare Other | Admitting: Oncology

## 2020-01-07 VITALS — BP 104/64 | HR 73 | Temp 97.0°F | Resp 15 | Ht 74.0 in | Wt 144.2 lb

## 2020-01-07 DIAGNOSIS — K439 Ventral hernia without obstruction or gangrene: Secondary | ICD-10-CM | POA: Diagnosis not present

## 2020-01-07 DIAGNOSIS — G4730 Sleep apnea, unspecified: Secondary | ICD-10-CM | POA: Insufficient documentation

## 2020-01-07 DIAGNOSIS — K5730 Diverticulosis of large intestine without perforation or abscess without bleeding: Secondary | ICD-10-CM | POA: Insufficient documentation

## 2020-01-07 DIAGNOSIS — G620 Drug-induced polyneuropathy: Secondary | ICD-10-CM | POA: Diagnosis not present

## 2020-01-07 DIAGNOSIS — I4891 Unspecified atrial fibrillation: Secondary | ICD-10-CM | POA: Insufficient documentation

## 2020-01-07 DIAGNOSIS — Z86010 Personal history of colonic polyps: Secondary | ICD-10-CM | POA: Diagnosis not present

## 2020-01-07 DIAGNOSIS — Z85038 Personal history of other malignant neoplasm of large intestine: Secondary | ICD-10-CM

## 2020-01-07 DIAGNOSIS — R161 Splenomegaly, not elsewhere classified: Secondary | ICD-10-CM | POA: Insufficient documentation

## 2020-01-07 DIAGNOSIS — K766 Portal hypertension: Secondary | ICD-10-CM | POA: Insufficient documentation

## 2020-01-07 DIAGNOSIS — Z800 Family history of malignant neoplasm of digestive organs: Secondary | ICD-10-CM | POA: Insufficient documentation

## 2020-01-07 DIAGNOSIS — Z85048 Personal history of other malignant neoplasm of rectum, rectosigmoid junction, and anus: Secondary | ICD-10-CM | POA: Diagnosis not present

## 2020-01-07 DIAGNOSIS — T451X5A Adverse effect of antineoplastic and immunosuppressive drugs, initial encounter: Secondary | ICD-10-CM | POA: Insufficient documentation

## 2020-01-07 DIAGNOSIS — R634 Abnormal weight loss: Secondary | ICD-10-CM | POA: Diagnosis not present

## 2020-01-07 DIAGNOSIS — D6959 Other secondary thrombocytopenia: Secondary | ICD-10-CM | POA: Diagnosis not present

## 2020-01-07 DIAGNOSIS — Z9221 Personal history of antineoplastic chemotherapy: Secondary | ICD-10-CM | POA: Diagnosis not present

## 2020-01-07 DIAGNOSIS — G20 Parkinson's disease: Secondary | ICD-10-CM | POA: Diagnosis not present

## 2020-01-07 LAB — CEA (IN HOUSE-CHCC): CEA (CHCC-In House): 1.83 ng/mL (ref 0.00–5.00)

## 2020-01-07 NOTE — Progress Notes (Signed)
[facility] OFFICE PROGRESS NOTE   Diagnosis: Colon cancer  INTERVAL HISTORY:   [name] was last seen at the Cancer center in January 2020.  He is followed by gastroenterology in [place] for management of portal hypertension and varices.  He reports undergoing repeat upper endoscopy/banding procedures.  He also has colonoscopy surveillance in [place].  He complains of bilateral inguinal and umbilical hernia. He is followed by neurology for treatment of Parkinson's disease.  He has lost weight.  He has difficulty swallowing from the Parkinson's disease.  Objective:  Vital signs in last 24 hours:  Blood pressure 104/64, pulse 73, temperature (!) 97 F (36.1 C), temperature source Tympanic, resp. rate 15, height '6\' 2"'  (1.88 m), weight 144 lb 3.2 oz (65.4 kg), SpO2 100 %.    Lymphatics: No cervical, supraclavicular, axillary, or inguinal nodes Resp: Lungs clear bilaterally Cardio: Irregular GI: No hepatosplenomegaly, no mass, nontender, reducible bilateral inguinal and umbilical hernias.  Firm nodular tissue at the inferior aspect of the umbilical hernia defect Vascular: Trace pitting edema of the lower leg bilaterally Neuro: Right arm tremor    Lab Results:  Lab Results  Component Value Date   WBC 4.6 02/21/2018   HGB 14.3 02/21/2018   HCT 41.0 02/21/2018   MCV 85.6 02/21/2018   PLT 105 (L) 02/21/2018   NEUTROABS 3.1 02/21/2018    CMP  Lab Results  Component Value Date   NA 140 09/19/2018   K 4.3 09/19/2018   CL 102 09/19/2018   CO2 23 09/19/2018   GLUCOSE 150 (H) 09/19/2018   BUN 15 09/19/2018   CREATININE 1.00 09/19/2018   CALCIUM 9.5 09/19/2018   PROT 6.5 10/24/2017   ALBUMIN 4.0 10/24/2017   AST 18 10/24/2017   ALT 23 10/24/2017   ALKPHOS 109 10/24/2017   BILITOT 1.1 10/24/2017   GFRNONAA 73 09/19/2018   GFRAA 84 09/19/2018    Lab Results  Component Value Date   CEA1 1.65 02/21/2018     Medications: I have reviewed the  patient's current medications.   Assessment/Plan: 1. Moderately differentiated adenocarcinoma of the cecum, stage IIIB (T4 N1), status post a right colectomy on 04/08/2011 -the tumor was microsatellite stable and K-ras wild-type . He began adjuvant CAPOX chemotherapy on 05/03/2011. He completed cycle 5 on 07/26/2011 (oxaliplatin was held from cycle 4). He completed cycle 7 beginning 09/06/2011. He completed cycle 8 beginning 09/27/2011.   CTs of the chest, abdomen, and pelvis 05/15/2013-negative for recurrent colon cancer  CTs of the chest, abdomen, and pelvis 06/12/2014-negative for recurrent colon cancer 2. History of colon polyps. He underwent a surveillance colonoscopy 11/07/2015, now followed in [place] for colonoscopy surveillance 3. History of anemia secondary to iron deficiency and chemotherapy. 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 Postoperative abdominal wall/flank hematoma.  5. Sleep apnea.  6. History of atrial fibrillation.  7. History of esophageal stricture.  8. Family history of colon cancer and gastric cancer. The tumor was microsatellite stable and no mutation was found in the mismatch repair genes. A CancerNext panel was negative.  9. 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 Neutropenia/thrombocytopenia secondary to chemotherapy-he has chronic mild thrombocytopenia-likely secondary to Portal hypertension 12. Abdominal wall hernia noted on a CT of the abdomen 12/03/2011.  13. Splenomegaly-noted on CT scans back to January of 2013.  14. Stool positive for occult blood 02/12/2013. Ferritin 38 02/14/2013. Colonoscopy and upper endoscopy 03/07/2013 with no  source for GI blood loss identified. Capsule endoscopy 03/20/2013 negative. Upper endoscopy September 2016 confirmed esophagus varices and portal hypertensive gastropathy. 15. Colonoscopy on  03/07/2013. At the anastomosis along the suture line there were several areas of prominent mucosa. Biopsies were taken. Moderate diverticulosis was noted in the sigmoid colon. Internal hemorrhoids also noted. No definite source for GI blood loss was identified. Biopsy of the anastomosis site showed benign colonic mucosa with inflamed granulation tissue. No adenomatous change or malignancy. He subsequently underwent an upper endoscopy (03/07/2013) which was normal.  16. Right distal femur fracture October 2015 status post surgery. 4. 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  Admission with acute GI bleeding secondary to esophagus varices January 2018-now followed by gastroenterology in [place] with repeat esophageal banding 19.  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  Parkinson's disease    Disposition: [name] has a history of stage III colon cancer diagnosed in February 2013.  He remains in clinical remission.  He has multiple comorbid conditions including portal portal hypertension, atrial fibrillation, and Parkinson's disease.  He has umbilical and bilateral inguinal hernias.  He would like to have the hernias repaired.  I made a referral to Dr. Johney Maine to consider hernia repairs, though he understands he may not be a surgical candidate.  [name] would like to continue follow-up at the cancer center.  He will return for an office visit in 1 year.  He has lost a significant amount of weight over the past year.  This is likely related to chronic disease and Parkinson's.  Russell Coder, MD  01/07/2020  3:31 PM

## 2020-01-07 NOTE — Telephone Encounter (Signed)
Scheduled appointment per 11/29 los. Spoke to patient who is aware of appointment date and time.  

## 2020-01-08 ENCOUNTER — Encounter: Payer: Self-pay | Admitting: *Deleted

## 2020-01-08 ENCOUNTER — Telehealth: Payer: Self-pay

## 2020-01-08 NOTE — Telephone Encounter (Signed)
-----   Message from Ladell Pier, MD sent at 01/07/2020  5:22 PM EST ----- Please call patient, CEA is normal

## 2020-01-08 NOTE — Progress Notes (Signed)
Faxed referral order, demographics and chart information to [facility] to see Dr. Johney Maine re: inguinal hernia x 2 and umbilical hernia.

## 2020-01-08 NOTE — Telephone Encounter (Signed)
Call spoke with pt cea is normal no questions or concerns voiced

## 2020-01-10 DIAGNOSIS — G4733 Obstructive sleep apnea (adult) (pediatric): Secondary | ICD-10-CM | POA: Insufficient documentation

## 2020-01-10 DIAGNOSIS — Z8679 Personal history of other diseases of the circulatory system: Secondary | ICD-10-CM | POA: Insufficient documentation

## 2020-01-10 DIAGNOSIS — Z9989 Dependence on other enabling machines and devices: Secondary | ICD-10-CM | POA: Insufficient documentation

## 2020-01-10 DIAGNOSIS — Z8719 Personal history of other diseases of the digestive system: Secondary | ICD-10-CM | POA: Insufficient documentation

## 2020-01-10 DIAGNOSIS — R0902 Hypoxemia: Secondary | ICD-10-CM | POA: Insufficient documentation

## 2020-01-10 DIAGNOSIS — K766 Portal hypertension: Secondary | ICD-10-CM | POA: Insufficient documentation

## 2020-01-10 DIAGNOSIS — K5790 Diverticulosis of intestine, part unspecified, without perforation or abscess without bleeding: Secondary | ICD-10-CM | POA: Insufficient documentation

## 2020-01-10 DIAGNOSIS — K222 Esophageal obstruction: Secondary | ICD-10-CM | POA: Insufficient documentation

## 2020-01-10 DIAGNOSIS — E7800 Pure hypercholesterolemia, unspecified: Secondary | ICD-10-CM | POA: Insufficient documentation

## 2020-01-10 DIAGNOSIS — D649 Anemia, unspecified: Secondary | ICD-10-CM | POA: Insufficient documentation

## 2020-01-10 DIAGNOSIS — M1990 Unspecified osteoarthritis, unspecified site: Secondary | ICD-10-CM | POA: Insufficient documentation

## 2020-01-10 DIAGNOSIS — K469 Unspecified abdominal hernia without obstruction or gangrene: Secondary | ICD-10-CM | POA: Insufficient documentation

## 2020-01-10 DIAGNOSIS — G4730 Sleep apnea, unspecified: Secondary | ICD-10-CM | POA: Insufficient documentation

## 2020-01-21 ENCOUNTER — Ambulatory Visit: Payer: Medicare Other | Admitting: Cardiology

## 2020-02-09 ENCOUNTER — Emergency Department (HOSPITAL_COMMUNITY): Payer: Medicare Other

## 2020-02-09 ENCOUNTER — Encounter (HOSPITAL_COMMUNITY): Payer: Self-pay

## 2020-02-09 ENCOUNTER — Inpatient Hospital Stay (HOSPITAL_COMMUNITY)
Admission: EM | Admit: 2020-02-09 | Discharge: 2020-02-16 | DRG: 432 | Disposition: A | Discharge status: Home / Self Care | Payer: Medicare Other | Attending: Family Medicine | Admitting: Family Medicine

## 2020-02-09 ENCOUNTER — Other Ambulatory Visit: Payer: Self-pay

## 2020-02-09 DIAGNOSIS — R0603 Acute respiratory distress: Secondary | ICD-10-CM | POA: Diagnosis not present

## 2020-02-09 DIAGNOSIS — I4891 Unspecified atrial fibrillation: Secondary | ICD-10-CM | POA: Diagnosis present

## 2020-02-09 DIAGNOSIS — R64 Cachexia: Secondary | ICD-10-CM | POA: Diagnosis present

## 2020-02-09 DIAGNOSIS — J9601 Acute respiratory failure with hypoxia: Secondary | ICD-10-CM | POA: Diagnosis not present

## 2020-02-09 DIAGNOSIS — K7460 Unspecified cirrhosis of liver: Secondary | ICD-10-CM | POA: Diagnosis not present

## 2020-02-09 DIAGNOSIS — K8020 Calculus of gallbladder without cholecystitis without obstruction: Secondary | ICD-10-CM | POA: Diagnosis present

## 2020-02-09 DIAGNOSIS — I083 Combined rheumatic disorders of mitral, aortic and tricuspid valves: Secondary | ICD-10-CM | POA: Diagnosis present

## 2020-02-09 DIAGNOSIS — G4733 Obstructive sleep apnea (adult) (pediatric): Secondary | ICD-10-CM | POA: Diagnosis present

## 2020-02-09 DIAGNOSIS — K7400 Hepatic fibrosis, unspecified: Secondary | ICD-10-CM | POA: Diagnosis present

## 2020-02-09 DIAGNOSIS — K766 Portal hypertension: Secondary | ICD-10-CM | POA: Diagnosis not present

## 2020-02-09 DIAGNOSIS — J9811 Atelectasis: Secondary | ICD-10-CM | POA: Diagnosis present

## 2020-02-09 DIAGNOSIS — T451X5A Adverse effect of antineoplastic and immunosuppressive drugs, initial encounter: Secondary | ICD-10-CM | POA: Diagnosis present

## 2020-02-09 DIAGNOSIS — I81 Portal vein thrombosis: Secondary | ICD-10-CM | POA: Diagnosis not present

## 2020-02-09 DIAGNOSIS — K4090 Unilateral inguinal hernia, without obstruction or gangrene, not specified as recurrent: Secondary | ICD-10-CM | POA: Diagnosis present

## 2020-02-09 DIAGNOSIS — N400 Enlarged prostate without lower urinary tract symptoms: Secondary | ICD-10-CM | POA: Diagnosis not present

## 2020-02-09 DIAGNOSIS — K2970 Gastritis, unspecified, without bleeding: Secondary | ICD-10-CM | POA: Diagnosis present

## 2020-02-09 DIAGNOSIS — I2510 Atherosclerotic heart disease of native coronary artery without angina pectoris: Secondary | ICD-10-CM | POA: Diagnosis present

## 2020-02-09 DIAGNOSIS — Z681 Body mass index (BMI) 19 or less, adult: Secondary | ICD-10-CM

## 2020-02-09 DIAGNOSIS — R471 Dysarthria and anarthria: Secondary | ICD-10-CM | POA: Diagnosis present

## 2020-02-09 DIAGNOSIS — M3489 Other systemic sclerosis: Secondary | ICD-10-CM | POA: Diagnosis present

## 2020-02-09 DIAGNOSIS — J90 Pleural effusion, not elsewhere classified: Secondary | ICD-10-CM | POA: Diagnosis not present

## 2020-02-09 DIAGNOSIS — Z9889 Other specified postprocedural states: Secondary | ICD-10-CM

## 2020-02-09 DIAGNOSIS — K219 Gastro-esophageal reflux disease without esophagitis: Secondary | ICD-10-CM | POA: Diagnosis present

## 2020-02-09 DIAGNOSIS — R188 Other ascites: Secondary | ICD-10-CM | POA: Diagnosis not present

## 2020-02-09 DIAGNOSIS — E43 Unspecified severe protein-calorie malnutrition: Secondary | ICD-10-CM | POA: Insufficient documentation

## 2020-02-09 DIAGNOSIS — Z8719 Personal history of other diseases of the digestive system: Secondary | ICD-10-CM

## 2020-02-09 DIAGNOSIS — Z20822 Contact with and (suspected) exposure to covid-19: Secondary | ICD-10-CM | POA: Diagnosis present

## 2020-02-09 DIAGNOSIS — G20 Parkinson's disease: Secondary | ICD-10-CM | POA: Diagnosis not present

## 2020-02-09 DIAGNOSIS — E872 Acidosis: Secondary | ICD-10-CM | POA: Diagnosis present

## 2020-02-09 DIAGNOSIS — H908 Mixed conductive and sensorineural hearing loss, unspecified: Secondary | ICD-10-CM | POA: Diagnosis present

## 2020-02-09 DIAGNOSIS — R932 Abnormal findings on diagnostic imaging of liver and biliary tract: Secondary | ICD-10-CM

## 2020-02-09 DIAGNOSIS — K5730 Diverticulosis of large intestine without perforation or abscess without bleeding: Secondary | ICD-10-CM | POA: Diagnosis not present

## 2020-02-09 DIAGNOSIS — Z79899 Other long term (current) drug therapy: Secondary | ICD-10-CM

## 2020-02-09 DIAGNOSIS — R079 Chest pain, unspecified: Secondary | ICD-10-CM | POA: Diagnosis not present

## 2020-02-09 DIAGNOSIS — R0600 Dyspnea, unspecified: Secondary | ICD-10-CM

## 2020-02-09 DIAGNOSIS — D696 Thrombocytopenia, unspecified: Secondary | ICD-10-CM | POA: Diagnosis present

## 2020-02-09 DIAGNOSIS — N179 Acute kidney failure, unspecified: Secondary | ICD-10-CM | POA: Diagnosis present

## 2020-02-09 DIAGNOSIS — E871 Hypo-osmolality and hyponatremia: Secondary | ICD-10-CM | POA: Diagnosis present

## 2020-02-09 DIAGNOSIS — G3281 Cerebellar ataxia in diseases classified elsewhere: Secondary | ICD-10-CM | POA: Diagnosis not present

## 2020-02-09 DIAGNOSIS — R161 Splenomegaly, not elsewhere classified: Secondary | ICD-10-CM | POA: Diagnosis present

## 2020-02-09 DIAGNOSIS — G609 Hereditary and idiopathic neuropathy, unspecified: Secondary | ICD-10-CM | POA: Diagnosis present

## 2020-02-09 DIAGNOSIS — Z85038 Personal history of other malignant neoplasm of large intestine: Secondary | ICD-10-CM

## 2020-02-09 DIAGNOSIS — J948 Other specified pleural conditions: Secondary | ICD-10-CM | POA: Diagnosis present

## 2020-02-09 DIAGNOSIS — D649 Anemia, unspecified: Secondary | ICD-10-CM | POA: Diagnosis not present

## 2020-02-09 DIAGNOSIS — L8990 Pressure ulcer of unspecified site, unspecified stage: Secondary | ICD-10-CM | POA: Insufficient documentation

## 2020-02-09 DIAGNOSIS — R0609 Other forms of dyspnea: Secondary | ICD-10-CM

## 2020-02-09 DIAGNOSIS — R7303 Prediabetes: Secondary | ICD-10-CM | POA: Diagnosis present

## 2020-02-09 DIAGNOSIS — R1310 Dysphagia, unspecified: Secondary | ICD-10-CM | POA: Diagnosis present

## 2020-02-09 DIAGNOSIS — Z87820 Personal history of traumatic brain injury: Secondary | ICD-10-CM

## 2020-02-09 DIAGNOSIS — E785 Hyperlipidemia, unspecified: Secondary | ICD-10-CM | POA: Diagnosis present

## 2020-02-09 DIAGNOSIS — K439 Ventral hernia without obstruction or gangrene: Secondary | ICD-10-CM | POA: Diagnosis present

## 2020-02-09 DIAGNOSIS — Z87891 Personal history of nicotine dependence: Secondary | ICD-10-CM

## 2020-02-09 DIAGNOSIS — Z800 Family history of malignant neoplasm of digestive organs: Secondary | ICD-10-CM

## 2020-02-09 DIAGNOSIS — Z8249 Family history of ischemic heart disease and other diseases of the circulatory system: Secondary | ICD-10-CM

## 2020-02-09 DIAGNOSIS — D509 Iron deficiency anemia, unspecified: Secondary | ICD-10-CM | POA: Diagnosis present

## 2020-02-09 DIAGNOSIS — L89311 Pressure ulcer of right buttock, stage 1: Secondary | ICD-10-CM | POA: Diagnosis present

## 2020-02-09 DIAGNOSIS — I313 Pericardial effusion (noninflammatory): Secondary | ICD-10-CM | POA: Diagnosis not present

## 2020-02-09 DIAGNOSIS — J690 Pneumonitis due to inhalation of food and vomit: Secondary | ICD-10-CM | POA: Diagnosis not present

## 2020-02-09 DIAGNOSIS — Z9049 Acquired absence of other specified parts of digestive tract: Secondary | ICD-10-CM

## 2020-02-09 LAB — TROPONIN I (HIGH SENSITIVITY)
Troponin I (High Sensitivity): 7 ng/L (ref <18)
Troponin I (High Sensitivity): 7 ng/L (ref <18)

## 2020-02-09 LAB — CBC
HCT: 37.5 % — ABNORMAL LOW (ref 39.0–52.0)
Hemoglobin: 12.8 g/dL — ABNORMAL LOW (ref 13.0–17.0)
MCH: 30.5 pg (ref 26.0–34.0)
MCHC: 34.1 g/dL (ref 30.0–36.0)
MCV: 89.5 fL (ref 80.0–100.0)
Platelets: 149 10*3/uL — ABNORMAL LOW (ref 150–400)
RBC: 4.19 MIL/uL — ABNORMAL LOW (ref 4.22–5.81)
RDW: 13.6 % (ref 11.5–15.5)
WBC: 8.9 10*3/uL (ref 4.0–10.5)
nRBC: 0 % (ref 0.0–0.2)

## 2020-02-09 LAB — BASIC METABOLIC PANEL
Anion gap: 11 (ref 5–15)
BUN: 27 mg/dL — ABNORMAL HIGH (ref 8–23)
CO2: 26 mmol/L (ref 22–32)
Calcium: 9.2 mg/dL (ref 8.9–10.3)
Chloride: 93 mmol/L — ABNORMAL LOW (ref 98–111)
Creatinine, Ser: 1.4 mg/dL — ABNORMAL HIGH (ref 0.61–1.24)
GFR, Estimated: 51 mL/min — ABNORMAL LOW (ref >60)
Glucose, Bld: 150 mg/dL — ABNORMAL HIGH (ref 70–99)
Potassium: 4.5 mmol/L (ref 3.5–5.1)
Sodium: 130 mmol/L — ABNORMAL LOW (ref 135–145)

## 2020-02-09 LAB — BRAIN NATRIURETIC PEPTIDE: B Natriuretic Peptide: 142.4 pg/mL — ABNORMAL HIGH (ref 0.0–100.0)

## 2020-02-09 LAB — HEPATIC FUNCTION PANEL
ALT: 16 U/L (ref 0–44)
AST: 20 U/L (ref 15–41)
Albumin: 2.8 g/dL — ABNORMAL LOW (ref 3.5–5.0)
Alkaline Phosphatase: 89 U/L (ref 38–126)
Bilirubin, Direct: 0.9 mg/dL — ABNORMAL HIGH (ref 0.0–0.2)
Indirect Bilirubin: 2.4 mg/dL — ABNORMAL HIGH (ref 0.3–0.9)
Total Bilirubin: 3.3 mg/dL — ABNORMAL HIGH (ref 0.3–1.2)
Total Protein: 6.9 g/dL (ref 6.5–8.1)

## 2020-02-09 LAB — PROTIME-INR
INR: 1.4 — ABNORMAL HIGH (ref 0.8–1.2)
Prothrombin Time: 16.5 seconds — ABNORMAL HIGH (ref 11.4–15.2)

## 2020-02-09 LAB — RESP PANEL BY RT-PCR (FLU A&B, COVID) ARPGX2
Influenza A by PCR: NEGATIVE
Influenza B by PCR: NEGATIVE
SARS Coronavirus 2 by RT PCR: NEGATIVE

## 2020-02-09 LAB — TSH: TSH: 1.319 u[IU]/mL (ref 0.350–4.500)

## 2020-02-09 MED ORDER — HEPARIN SODIUM (PORCINE) 5000 UNIT/ML IJ SOLN
5000.0000 [IU] | Freq: Three times a day (TID) | INTRAMUSCULAR | Status: DC
  Filled 2020-02-09: qty 1

## 2020-02-09 MED ORDER — SPIRONOLACTONE 100 MG PO TABS
100.0000 mg | ORAL_TABLET | Freq: Every day | ORAL | Status: DC
Start: 2020-02-10 — End: 2020-02-11
  Administered 2020-02-10 – 2020-02-11 (×2): 100 mg via ORAL
  Filled 2020-02-09 (×2): qty 1

## 2020-02-09 MED ORDER — PROPRANOLOL HCL 20 MG PO TABS
20.0000 mg | ORAL_TABLET | Freq: Two times a day (BID) | ORAL | Status: DC
  Administered 2020-02-09 – 2020-02-16 (×14): 20 mg via ORAL
  Filled 2020-02-09 (×15): qty 1

## 2020-02-09 MED ORDER — ACETAMINOPHEN 500 MG PO TABS
1000.0000 mg | ORAL_TABLET | Freq: Four times a day (QID) | ORAL | Status: DC | PRN
  Administered 2020-02-16: 1000 mg via ORAL
  Filled 2020-02-09: qty 2

## 2020-02-09 MED ORDER — FUROSEMIDE 40 MG PO TABS
40.0000 mg | ORAL_TABLET | Freq: Every day | ORAL | Status: DC
  Administered 2020-02-10 – 2020-02-11 (×2): 40 mg via ORAL
  Filled 2020-02-09 (×2): qty 1

## 2020-02-09 MED ORDER — CARBIDOPA-LEVODOPA ER 50-200 MG PO TBCR
1.0000 | EXTENDED_RELEASE_TABLET | Freq: Two times a day (BID) | ORAL | Status: DC
  Administered 2020-02-10 – 2020-02-16 (×13): 1 via ORAL
  Filled 2020-02-09 (×15): qty 1

## 2020-02-09 MED ORDER — MUSCLE RUB 10-15 % EX CREA
TOPICAL_CREAM | CUTANEOUS | Status: DC | PRN
  Administered 2020-02-09: 1 via TOPICAL
  Filled 2020-02-09 (×2): qty 85

## 2020-02-09 MED ORDER — PANTOPRAZOLE SODIUM 40 MG PO TBEC
40.0000 mg | DELAYED_RELEASE_TABLET | Freq: Two times a day (BID) | ORAL | Status: DC
  Administered 2020-02-09 – 2020-02-16 (×14): 40 mg via ORAL
  Filled 2020-02-09 (×14): qty 1

## 2020-02-09 MED ORDER — POLYETHYLENE GLYCOL 3350 17 G PO PACK: 17.0000 g | PACK | Freq: Every day | ORAL | Status: DC | PRN

## 2020-02-09 MED ORDER — IOHEXOL 350 MG/ML SOLN
70.0000 mL | Freq: Once | INTRAVENOUS | Status: AC | PRN
  Administered 2020-02-09: 70 mL via INTRAVENOUS

## 2020-02-09 NOTE — ED Notes (Signed)
Provided pt w/ spit cup and explained NPO status as per Thornell Mule DO

## 2020-02-09 NOTE — ED Notes (Signed)
O2 probe moved to forehead, O2 sats@ 100%

## 2020-02-09 NOTE — ED Triage Notes (Signed)
Patient complains of painful SOB-states that he has parkinsons and thinks he may have aspirated a hamburger 2 nights ago. Patient has eaten and drank since with no difficulty. Speaking complete sentences

## 2020-02-09 NOTE — ED Provider Notes (Addendum)
[facility] EMERGENCY DEPARTMENT Provider Note   CSN: [id number] Arrival date & time: 02/09/20  [id number]     History Chief Complaint  Patient presents with  . Chest Pain  . Shortness of Breath    [name] is a 80 y.o. male.  Patient with hx Parkinson's disease c/o pain right lower chest, and mild sob, for the past two days. Symptoms acute onset, moderate, dull to sharp, non radiating, localized, pleuritic. States he wonders whether could have gotten food in there, but denies any specific aspiration event or choking. Pt/spouse deny hx dysphagia or aspiration in past. No hx dvt or pe. Occasional weak, non prod cough - states cant clear anything up or out. No sore throat, congestion or other uri symptoms. No fever or chills. No leg pain or swelling. No hx dvt or pe.   The history is provided by the patient and the spouse.       Past Medical History:  Diagnosis Date  . Anemia   . Aortic valve disorder 07/17/2018  . Arthritis    "thumbs" (11/29/2013)  . Atrial fibrillation [facility]) 2006   Dr Tonny Bollman af-no meds  . Bleeding esophageal varices ([place])   . BPH (benign prostatic hypertrophy)   . CAD (coronary artery disease)    Mild by Cath 2004. Calcification with mild irregularity.  30% diagonal.  Cardiolite 8/13 normal EF and negative for ischemia (chronically positive ETT)   . CAP (community acquired pneumonia) 02/12/2013  . 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 Disorder of bilirubin excretion   . Diverticulosis    Sigmoid colon  . 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 Esophageal stricture   . Esophageal varices determined by endoscopy ([place]) sept 2016  . Fibrosis of liver   . Fracture of distal femur ([place]) 11/29/2013  . GERD (gastroesophageal reflux disease) 03-31-11   tx. Omeprazole  . 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 Hernia, abdominal    at the umbilicus  . History of colon cancer 02/25/2016  .  History of esophageal varices   . History of hemorrhoids   . History of irregular heartbeat   . Hypercholesterolemia   . HYPERGLYCEMIA, FASTING 12/13/2007  . Hyperlipidemia   . Malignant neoplasm of ascending colon ([place]) 03/03/2011   s/p right colectomy 04/08/11 Dr. Barkley Bruns Genetic counseling recommendations:  SURVEILLANCE:  Because of the increased risk for cancer in you and your family which we cannot yet define through genetic testing, we urge you and family members to pursue screenings that are recommended for individuals at increased risk of Lynch syndrome (HNPCC)-associated cancers. The following measures are recommend  . Maxillary sinusitis, acute 05/06/2015  . 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 Nephrolithiasis   . Neuropathy 2013   as a result of chemo  . Obstructive sleep apnea 12/14/2007  . OSA on CPAP dx'd 2010   uses cap nightly  . Oxygen deficiency   . Parkinsonism ([place]) 10/24/2017  . Personal history of colonic polyps 09/17/2009   Tubular adenoma  . Pneumonia 01/2013  . 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 Portal hypertension with esophageal varices ([place]) 10/24/2017  . Resting tremor 10/24/2017  . Shortness of breath at rest   . Sleep apnea    uses c pap at home  . TBI (traumatic brain injury) ([place]) 1986   S/P fall; "slow to get  going q morning" (10//22/2015)  . Thrombocytopenia ([place]) 02/25/2016  . Titubation 10/24/2017  . Upper GI bleed 02/25/2016  . Viral URI with cough 01/24/2013    Patient Active Problem List   Diagnosis Date Noted  . Sleep apnea   . Portal hypertension ([place])   . Oxygen deficiency   . Hypercholesterolemia   . History of irregular heartbeat   . History of hemorrhoids   . Hernia, abdominal   . Anemia   . Arthritis   . Diverticulosis   . Esophageal stricture   . OSA on CPAP   . Chemotherapy-induced neutropenia ([place])  08/16/2018  . 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 Aortic valve disorder 07/17/2018  . Parkinsonism ([place]) 10/24/2017  . Portal hypertension with esophageal varices (HCC) 10/24/2017  . Resting tremor 10/24/2017  . Titubation 10/24/2017  . 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 Bleeding esophageal varices ([place])   . Acute GI bleeding   . Shortness of breath at rest   . Upper GI bleed 02/25/2016  . History of colon cancer 02/25/2016  . Thrombocytopenia ([place]) 02/25/2016  . History of esophageal varices   . Maxillary sinusitis, acute 05/06/2015  . Fibrosis of liver   . Esophageal varices determined by endoscopy ([place]) 10/2014  . 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 Fracture of distal femur ([place]) 11/29/2013  . CAP (community acquired pneumonia) 02/12/2013  . Viral URI with cough 01/24/2013  . Pneumonia 01/2013  . CAD (coronary artery disease)   . Nephrolithiasis   . GERD (gastroesophageal reflux disease) 03/31/2011  . Malignant neoplasm of ascending colon ([place]) 03/03/2011  . Neuropathy 2013  . Personal history of colonic polyps 09/17/2009  . 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 Disorder of bilirubin excretion   . Obstructive sleep apnea 12/14/2007  . HYPERGLYCEMIA, FASTING 12/13/2007  . Atrial fibrillation ([place]) 10/03/2007  . Hyperlipidemia 09/22/2006  . 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 BPH (benign prostatic hypertrophy)   . TBI (traumatic brain injury) ([place]) 1986    Past Surgical History:  Procedure Laterality Date  . APPENDECTOMY  03/2011  . CARDIAC CATHETERIZATION  07/2002   Dr Wynonia Lawman  . COLON SURGERY  2013  . COLONOSCOPY  sept 2016   upper endo and colonoscopy  . ESOPHAGEAL DILATION  2006  . ESOPHAGOGASTRODUODENOSCOPY (EGD) WITH PROPOFOL N/A 02/25/2016   Procedure: ESOPHAGOGASTRODUODENOSCOPY (EGD) WITH PROPOFOL;  Surgeon: Manus Gunning, MD;  Location: [facility];   Service: Gastroenterology;  Laterality: N/A;  . ESOPHAGOGASTRODUODENOSCOPY (EGD) WITH PROPOFOL N/A 03/10/2016   Procedure: ESOPHAGOGASTRODUODENOSCOPY (EGD) WITH PROPOFOL;  Surgeon: Manus Gunning, MD;  Location: [facility];  Service: Gastroenterology;  Laterality: N/A;  . GASTRIC VARICES BANDING N/A 03/10/2016   Procedure: GASTRIC VARICES BANDING;  Surgeon: Manus Gunning, MD;  Location: [facility];  Service: Gastroenterology;  Laterality: N/A;  . INGUINAL HERNIA REPAIR Left ~ 1991  . LAPAROSCOPIC PARTIAL COLECTOMY  04/08/2011  . ORIF DISTAL FEMUR FRACTURE Right 11/29/2013   "put plate in"  . ORIF FEMUR FRACTURE Right 11/29/2013   Procedure: OPEN REDUCTION INTERNAL FIXATION (ORIF) RIGHT  DISTAL FEMUR FRACTURE;  Surgeon: Rozanna Box, MD;  Location: [place];  Service: Orthopedics;  Laterality: Right;  . PORT-A-CATH REMOVAL  11/09/2011   Procedure: REMOVAL PORT-A-CATH;  Surgeon: Odis Hollingshead, MD;  Location: [place] [facility] [place];  Service: General;  Laterality: N/A;  . PORTACATH PLACEMENT  04/30/2011   Procedure: INSERTION PORT-A-CATH;  Surgeon: Rhunette Croft  Abbey Chatters, MD;  Location: [facility] OR;  Service: General;  Laterality: Right;       Family History  Problem Relation Age of Onset  . Diabetes Father   . Prostate cancer Father   . Heart attack Father 81  . Heart failure Mother        Congestive heart failure  . Cancer Brother        colon  . Colon cancer Brother 50  . Stomach cancer Sister   . Colon cancer Other   . Colon cancer Sister   . Coronary artery disease Brother        CABG  . Lung cancer Brother   . Anesthesia problems Neg Hx   . Esophageal cancer Neg Hx     Social History   Tobacco Use  . Smoking status: Former Smoker    Packs/day: 0.50    Years: 7.00    Pack years: 3.50    Types: Cigarettes    Quit date: 03/21/1961    Years since quitting: 58.9  . Smokeless tobacco: Never Used  Vaping Use  . Vaping Use: Never used  Substance Use Topics   . Alcohol use: No    Alcohol/week: 0.0 standard drinks  . Drug use: No    Home Medications Prior to Admission medications   Medication Sig Start Date End Date Taking? Authorizing Provider  amphetamine-dextroamphetamine (ADDERALL) 30 MG tablet Take 10-20 mg by mouth daily with breakfast.  12/30/10   [provider]  carbidopa-levodopa (SINEMET CR) 50-200 MG tablet TAKE 1 TABLET BY MOUTH TWICE DAILY 1 HOUR BEFORE BREAKFAST AND BEFORE DINNER 01/01/20   Dohmeier, Porfirio Mylar, MD  ciprofloxacin (CILOXAN) 0.3 % ophthalmic solution Place 4 drops into the right ear every 30 (thirty) days. 12/04/19   [provider]  ferrous sulfate 325 (65 FE) MG tablet Take 325 mg by mouth 2 (two) times daily.    [provider]  furosemide (LASIX) 40 MG tablet Take 40 mg by mouth daily.     [provider]  pantoprazole (PROTONIX) 40 MG tablet Take 1 tablet (40 mg total) by mouth 2 (two) times daily. 02/29/16   Jerald Kief, MD  propranolol (INDERAL) 20 MG tablet Take 1 tablet (20 mg total) by mouth 2 (two) times daily. 02/29/16   Jerald Kief, MD  spironolactone (ALDACTONE) 100 MG tablet Take 100 mg by mouth daily.  07/18/18   [provider]    Allergies    Patient has no known allergies.  Review of Systems   Review of Systems  Constitutional: Negative for fever.  HENT: Negative for sore throat.   Eyes: Negative for redness.  Respiratory: Positive for cough and shortness of breath.   Cardiovascular: Positive for chest pain. Negative for leg swelling.  Gastrointestinal: Negative for abdominal pain, nausea and vomiting.  Genitourinary: Negative for flank pain.  Musculoskeletal: Negative for back pain and neck pain.  Skin: Negative for rash.  Neurological: Negative for headaches.  Hematological: Does not bruise/bleed easily.  Psychiatric/Behavioral: Negative for confusion.    Physical Exam Updated Vital Signs BP 106/61 (BP Location: Left Arm)   Pulse 98    Temp 98.1 F (36.7 C) (Oral)   Resp (!) 24   Ht 1.829 m (6')   Wt 61.2 kg   SpO2 96%   BMI 18.31 kg/m   Physical Exam Vitals and nursing note reviewed.  Constitutional:      Appearance: Normal appearance. He is well-developed.  HENT:  Head: Atraumatic.     Nose: Nose normal.     Mouth/Throat:     Mouth: Mucous membranes are moist.     Pharynx: Oropharynx is clear.  Eyes:     General: No scleral icterus.    Conjunctiva/sclera: Conjunctivae normal.  Neck:     Trachea: No tracheal deviation.  Cardiovascular:     Rate and Rhythm: Normal rate and regular rhythm.     Pulses: Normal pulses.     Heart sounds: Normal heart sounds. No murmur heard. No friction rub. No gallop.   Pulmonary:     Effort: Pulmonary effort is normal. No accessory muscle usage.     Comments: Diminished breath sounds on right. Increased wob.  Abdominal:     General: Bowel sounds are normal. There is no distension.     Palpations: Abdomen is soft.     Tenderness: There is no abdominal tenderness. There is no guarding.  Genitourinary:    Comments: No cva tenderness. Musculoskeletal:        General: No swelling or tenderness.     Cervical back: Normal range of motion and neck supple. No rigidity.     Right lower leg: No edema.     Left lower leg: No edema.  Skin:    General: Skin is warm and dry.     Findings: No rash.  Neurological:     Mental Status: He is alert.     Comments: Alert, speech clear.   Psychiatric:        Mood and Affect: Mood normal.     ED Results / Procedures / Treatments   Labs (all labs ordered are listed, but only abnormal results are displayed) Results for orders placed or performed during the hospital encounter of [id number]  Basic metabolic panel  Result Value Ref Range   Sodium 130 (L) 135 - 145 mmol/L   Potassium 4.5 3.5 - 5.1 mmol/L   Chloride 93 (L) 98 - 111 mmol/L   CO2 26 22 - 32 mmol/L   Glucose, Bld 150 (H) 70 - 99 mg/dL   BUN 27 (H) 8 - 23 mg/dL    Creatinine, Ser 1.40 (H) 0.61 - 1.24 mg/dL   Calcium 9.2 8.9 - 10.3 mg/dL   GFR, Estimated 51 (L) >60 mL/min   Anion gap 11 5 - 15  CBC  Result Value Ref Range   WBC 8.9 4.0 - 10.5 K/uL   RBC 4.19 (L) 4.22 - 5.81 MIL/uL   Hemoglobin 12.8 (L) 13.0 - 17.0 g/dL   HCT 37.5 (L) 39.0 - 52.0 %   MCV 89.5 80.0 - 100.0 fL   MCH 30.5 26.0 - 34.0 pg   MCHC 34.1 30.0 - 36.0 g/dL   RDW 13.6 11.5 - 15.5 %   Platelets 149 (L) 150 - 400 K/uL   nRBC 0.0 0.0 - 0.2 %  Troponin I (High Sensitivity)  Result Value Ref Range   Troponin I (High Sensitivity) 7 <18 ng/L  Troponin I (High Sensitivity)  Result Value Ref Range   Troponin I (High Sensitivity) 7 <18 ng/L   DG Chest 2 View  Result Date: 02/09/2020 CLINICAL DATA:  80 year old male with aspiration EXAM: CHEST - 2 VIEW COMPARISON:  03/01/2018, 02/25/2016 FINDINGS: Cardiomediastinal silhouette unchanged in size and contour, with the right heart border is now obscured by right lung opacity. Air-fluid level of the right lung obscuring the right hemidiaphragm and the right heart border. No pneumothorax. No left-sided pleural fluid.  Coarsened interstitial markings.  Opacity at the posterior lung base on the lateral view corresponds to the findings on the frontal. No displaced fracture.  Degenerative changes of the spine. IMPRESSION: New right pleural effusion with associated atelectasis/consolidation. Aspiration pneumonitis/pneumonia not excluded. Electronically Signed   By: Corrie Mckusick D.O.   On: 02/09/2020 10:15    EKG EKG Interpretation  Date/Time:  Saturday February 09 2020 09:31:02 EST Ventricular Rate:  105 PR Interval:    QRS Duration: 72 QT Interval:  332 QTC Calculation: 438 R Axis:   72 Text Interpretation: Atrial fibrillation with rapid ventricular response Nonspecific T wave abnormality Confirmed by Lajean Saver [phone]) on 02/09/2020 1:33:45 PM   Radiology DG Chest 2 View  Result Date: 02/09/2020 CLINICAL DATA:  80 year old male with  aspiration EXAM: CHEST - 2 VIEW COMPARISON:  03/01/2018, 02/25/2016 FINDINGS: Cardiomediastinal silhouette unchanged in size and contour, with the right heart border is now obscured by right lung opacity. Air-fluid level of the right lung obscuring the right hemidiaphragm and the right heart border. No pneumothorax. No left-sided pleural fluid.  Coarsened interstitial markings. Opacity at the posterior lung base on the lateral view corresponds to the findings on the frontal. No displaced fracture.  Degenerative changes of the spine. IMPRESSION: New right pleural effusion with associated atelectasis/consolidation. Aspiration pneumonitis/pneumonia not excluded. Electronically Signed   By: Corrie Mckusick D.O.   On: 02/09/2020 10:15   CLINICAL DATA: High probability of pulmonary embolus.  EXAM: CT ANGIOGRAPHY CHEST WITH CONTRAST  TECHNIQUE: Multidetector CT imaging of the chest was performed using the standard protocol during bolus administration of intravenous contrast. Multiplanar CT image reconstructions and MIPs were obtained to evaluate the vascular anatomy.  CONTRAST: 71mL OMNIPAQUE IOHEXOL 350 MG/ML SOLN  COMPARISON: Jun 12, 2014.  FINDINGS: Cardiovascular: Satisfactory opacification of the pulmonary arteries to the segmental level. No evidence of pulmonary embolism. Normal heart size. No pericardial effusion.  Mediastinum/Nodes: No enlarged mediastinal, hilar, or axillary lymph nodes. Thyroid gland, trachea, and esophagus demonstrate no significant findings.  Lungs/Pleura: No pneumothorax is noted. Large right pleural effusion is noted which results in right to left mediastinal shift. Complete atelectasis of the right lower lobe is noted. Partial atelectasis of the right upper and middle lobes is noted. Minimal left pleural effusion is noted with adjacent subsegmental atelectasis.  Upper Abdomen: No acute abnormality.  Musculoskeletal: No chest wall abnormality. No acute or  significant osseous findings.  Review of the MIP images confirms the above findings.  IMPRESSION: 1. No definite evidence of pulmonary embolus. 2. Large right pleural effusion is noted which results in right to left mediastinal shift and complete atelectasis of the right lower lobe. Partial atelectasis of the right upper and middle lobes is noted. Minimal left pleural effusion is noted with adjacent subsegmental atelectasis.   Electronically Signed By: Marijo Conception M.D. On: 02/09/2020 15:50     Procedures Procedures (including critical care time)  Medications Ordered in ED Medications - No data to display  ED Course  I have reviewed the triage vital signs and the nursing notes.  Pertinent labs & imaging results that were available during my care of the patient were reviewed by me and considered in my medical decision making (see chart for details).    MDM Rules/Calculators/A&P                         Cxr. Labs. Ecg.   Reviewed nursing notes and prior charts for additional history. On prior swallow study, pt  noted to be 'mild aspiration risk'.   CXR reviewed/interpreted by me - RLL effusion, ?infiltrate.   CT ordered.   Labs reviewed/interpreted by me - wbc normal.  CT reviewed/interpreted by me - large effusion, atelectasis - ?due to aspiration.  On room air, sats 87-88%. 2 liters o2 [place].   Medicine consulted for admission.     Final Clinical Impression(s) / ED Diagnoses Final diagnoses:  None    Rx / DC Orders ED Discharge Orders    None          Lajean Saver, MD 02/09/20 1714

## 2020-02-09 NOTE — ED Notes (Signed)
Pharmacy tech at bedside to perform med rec

## 2020-02-09 NOTE — ED Notes (Signed)
Attempted report x 2 

## 2020-02-09 NOTE — H&P (Signed)
History and Physical    [name]:[id number] DOB: [birth year] DOA: 02/09/2020  PCP: Geoffry Paradise, MD  Patient coming from: home Chief Complaint: right sided chest pain and shortness of breath ?after an aspiration event  HPI: [name] is a 80 y.o. male with a pertinent history of  parkinsons GERD and history of esophageal stricture, cirrhosis of liver with portal hypertension and esophageal varices and thrombocytopenia, OSA on CPAP,, A. fib, aortic valve disorder, anemia, BPH, CAD, history of colon cancer status post chemotherapy which he thinks caused cirrhosis.  He states he has noticed Right lower chest pain, pleuriticin nature and he can't get a good breath.  He states they really noticed it yesterday after patient was eating [place] and a hamburger and had an aspiration event of hamburger they think.  He denies any fevers or chills or sick contacts but since then.  We thought this could have been coincidental as it takes something for someone to notice it.  Daughter helps corroborate the story.  In the emergency department 118/73, 98.1, HR 95, 95% on room air, RR 18.  WBC 8.9, Hgb 12.8, PLT 149, NA 130, K4.5, CL 93, CO2 26, SCR 1.40, glucose 150  CTa IMPRESSION: 1. No definite evidence of pulmonary embolus. 2. Large right pleural effusion is noted which results in right to left mediastinal shift and complete atelectasis of the right lower lobe. Partial atelectasis of the right upper and middle lobes is noted. Minimal left pleural effusion is noted with adjacent subsegmental atelectasis. No abx were given.  SLP previously showed mild aspiration risk.  Review of Systems: As per HPI otherwise 10 point review of systems negative.  Other pertinents as below:  General - denies any f/c HEENT - denies new ha's or visual changes Cardio - denies precordial CP, palpitations Resp - has sob, no cough or production or hemoptysis GI - denies n/v/d/GI pain, hematochezia or  melena GU - Denies urinary changes or frequency changes MSK - denies new joint or back pain but feels a little weaker today Skin - denies new skin changes Neuro - denies any stroke symptoms or confusion, denies a tremor. Psych - denies anxiety or depression  Past Medical History:  Diagnosis Date  . Anemia   . Aortic valve disorder 07/17/2018  . Arthritis    "thumbs" (11/29/2013)  . Atrial fibrillation [facility]) 2006   Dr Franki Monte af-no meds  . Bleeding esophageal varices (HCC)   . BPH (benign prostatic hypertrophy)   . CAD (coronary artery disease)    Mild by Cath 2004. Calcification with mild irregularity.  30% diagonal.  Cardiolite 8/13 normal EF and negative for ischemia (chronically positive ETT)   . CAP (community acquired pneumonia) 02/12/2013  . 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 Disorder of bilirubin excretion   . Diverticulosis    Sigmoid colon  . 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 Esophageal stricture   . Esophageal varices determined by endoscopy (HCC) sept 2016  . Fibrosis of liver   . Fracture of distal femur (HCC) 11/29/2013  . GERD (gastroesophageal reflux disease) 03-31-11   tx. Omeprazole  . 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 Hernia, abdominal    at the umbilicus  . History of colon cancer 02/25/2016  . History of esophageal varices   . History of hemorrhoids   . History of irregular heartbeat   . Hypercholesterolemia   . HYPERGLYCEMIA, FASTING 12/13/2007  . Hyperlipidemia   . Malignant neoplasm  of ascending colon ([place]) 03/03/2011   s/p right colectomy 04/08/11 Dr. Barkley Bruns Genetic counseling recommendations:  SURVEILLANCE:  Because of the increased risk for cancer in you and your family which we cannot yet define through genetic testing, we urge you and family members to pursue screenings that are recommended for individuals at increased risk of Lynch syndrome (HNPCC)-associated cancers. The  following measures are recommend  . Maxillary sinusitis, acute 05/06/2015  . 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 Nephrolithiasis   . Neuropathy 2013   as a result of chemo  . Obstructive sleep apnea 12/14/2007  . OSA on CPAP dx'd 2010   uses cap nightly  . Oxygen deficiency   . Parkinsonism ([place]) 10/24/2017  . Personal history of colonic polyps 09/17/2009   Tubular adenoma  . Pneumonia 01/2013  . 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 Portal hypertension with esophageal varices ([place]) 10/24/2017  . Resting tremor 10/24/2017  . Shortness of breath at rest   . Sleep apnea    uses c pap at home  . TBI (traumatic brain injury) ([place]) 1986   S/P fall; "slow to get going q morning" (10//22/2015)  . Thrombocytopenia ([place]) 02/25/2016  . Titubation 10/24/2017  . Upper GI bleed 02/25/2016  . Viral URI with cough 01/24/2013    Past Surgical History:  Procedure Laterality Date  . APPENDECTOMY  03/2011  . CARDIAC CATHETERIZATION  07/2002   Dr Wynonia Lawman  . COLON SURGERY  2013  . COLONOSCOPY  sept 2016   upper endo and colonoscopy  . ESOPHAGEAL DILATION  2006  . ESOPHAGOGASTRODUODENOSCOPY (EGD) WITH PROPOFOL N/A 02/25/2016   Procedure: ESOPHAGOGASTRODUODENOSCOPY (EGD) WITH PROPOFOL;  Surgeon: Manus Gunning, MD;  Location: [facility];  Service: Gastroenterology;  Laterality: N/A;  . ESOPHAGOGASTRODUODENOSCOPY (EGD) WITH PROPOFOL N/A 03/10/2016   Procedure: ESOPHAGOGASTRODUODENOSCOPY (EGD) WITH PROPOFOL;  Surgeon: Manus Gunning, MD;  Location: [facility];  Service: Gastroenterology;  Laterality: N/A;  . GASTRIC VARICES BANDING N/A 03/10/2016   Procedure: GASTRIC VARICES BANDING;  Surgeon: Manus Gunning, MD;  Location: [facility];  Service: Gastroenterology;  Laterality: N/A;  . INGUINAL HERNIA REPAIR Left ~ 1991  . LAPAROSCOPIC PARTIAL COLECTOMY  04/08/2011   . ORIF DISTAL FEMUR FRACTURE Right 11/29/2013   "put plate in"  . ORIF FEMUR FRACTURE Right 11/29/2013   Procedure: OPEN REDUCTION INTERNAL FIXATION (ORIF) RIGHT  DISTAL FEMUR FRACTURE;  Surgeon: Rozanna Box, MD;  Location: [place];  Service: Orthopedics;  Laterality: Right;  . PORT-A-CATH REMOVAL  11/09/2011   Procedure: REMOVAL PORT-A-CATH;  Surgeon: Odis Hollingshead, MD;  Location: [place];  Service: General;  Laterality: N/A;  . PORTACATH PLACEMENT  04/30/2011   Procedure: INSERTION PORT-A-CATH;  Surgeon: Odis Hollingshead, MD;  Location: [place];  Service: General;  Laterality: Right;     reports that he quit smoking about 58 years ago. His smoking use included cigarettes. He has a 3.50 pack-year smoking history. He has never used smokeless tobacco. He reports that he does not drink alcohol and does not use drugs.  No Known Allergies  Family History  Problem Relation Age of Onset  . Diabetes Father   . Prostate cancer Father   . Heart attack Father 47  . Heart failure Mother        Congestive heart failure  . Cancer Brother  colon  . Colon cancer Brother 61  . Stomach cancer Sister   . Colon cancer Other   . Colon cancer Sister   . Coronary artery disease Brother        CABG  . Lung cancer Brother   . Anesthesia problems Neg Hx   . Esophageal cancer Neg Hx      Prior to Admission medications   Medication Sig Start Date End Date Taking? Authorizing Provider  amphetamine-dextroamphetamine (ADDERALL) 30 MG tablet Take 10-20 mg by mouth daily with breakfast.  12/30/10   [provider]  carbidopa-levodopa (SINEMET CR) 50-200 MG tablet TAKE 1 TABLET BY MOUTH TWICE DAILY 1 HOUR BEFORE BREAKFAST AND BEFORE DINNER 01/01/20   Dohmeier, Asencion Partridge, MD  ciprofloxacin (CILOXAN) 0.3 % ophthalmic solution Place 4 drops into the right ear every 30 (thirty) days. 12/04/19   [provider]  ferrous sulfate 325 (65 FE) MG tablet Take 325 mg by mouth  2 (two) times daily.    [provider]  furosemide (LASIX) 40 MG tablet Take 40 mg by mouth daily.     [provider]  pantoprazole (PROTONIX) 40 MG tablet Take 1 tablet (40 mg total) by mouth 2 (two) times daily. 02/29/16   Donne Hazel, MD  propranolol (INDERAL) 20 MG tablet Take 1 tablet (20 mg total) by mouth 2 (two) times daily. 02/29/16   Donne Hazel, MD  spironolactone (ALDACTONE) 100 MG tablet Take 100 mg by mouth daily.  07/18/18   [provider]    Physical Exam: Vitals:   02/09/20 1415 02/09/20 1500 02/09/20 1636 02/09/20 1730  BP: 106/61 (!) 104/38 (!) 112/41 (!) 112/49  Pulse: 98 (!) 106 (!) 103 (!) 106  Resp: (!) 24 17 (!) 21 19  Temp:      TempSrc:      SpO2: 96% 95% 97% 100%  Weight:      Height:        Constitutional: NAD, comfortable, increased work of breathing when moving somewhat. Eyes: pupils equal and reactive to light, anicteric, without injection ENMT: MMM, throat without exudates or erythema Neck: normal, supple, no masses, no thyromegaly noted Respiratory:diminished breath sounds on right side, absent, L is clear to auscultation  Cardiovascular: rrr w/o mrg, warm extremities Abdomen: NBS, NT,   Musculoskeletal: somewhat weakn appearing, thin and temporal wasting and sunken eyes. Skin: no rashes, lesions, ulcers. No induration Neurologic: CN 2-12 grossly intact. Sensation intact Psychiatric: AO appearing, mentation appropriate  Labs on Admission: I have personally reviewed following labs and imaging studies  CBC: Recent Labs  Lab 02/09/20 0946  WBC 8.9  HGB 12.8*  HCT 37.5*  MCV 89.5  PLT 123456*   Basic Metabolic Panel: Recent Labs  Lab 02/09/20 0946  NA 130*  K 4.5  CL 93*  CO2 26  GLUCOSE 150*  BUN 27*  CREATININE 1.40*  CALCIUM 9.2   GFR: Estimated Creatinine Clearance: 37.6 mL/min (A) (by C-G formula based on SCr of 1.4 mg/dL (H)). Liver Function Tests: No results for input(s): AST, ALT, ALKPHOS,  BILITOT, PROT, ALBUMIN in the last 168 hours. No results for input(s): LIPASE, AMYLASE in the last 168 hours. No results for input(s): AMMONIA in the last 168 hours. Coagulation Profile: No results for input(s): INR, PROTIME in the last 168 hours. Cardiac Enzymes: No results for input(s): CKTOTAL, CKMB, CKMBINDEX, TROPONINI in the last 168 hours. BNP (last 3 results) No results for input(s): PROBNP in the last 8760 hours. HbA1C: No  results for input(s): HGBA1C in the last 72 hours. CBG: No results for input(s): GLUCAP in the last 168 hours. Lipid Profile: No results for input(s): CHOL, HDL, LDLCALC, TRIG, CHOLHDL, LDLDIRECT in the last 72 hours. Thyroid Function Tests: No results for input(s): TSH, T4TOTAL, FREET4, T3FREE, THYROIDAB in the last 72 hours. Anemia Panel: No results for input(s): VITAMINB12, FOLATE, FERRITIN, TIBC, IRON, RETICCTPCT in the last 72 hours. Urine analysis:    Component Value Date/Time   COLORURINE yellow 12/24/2009 0927   APPEARANCEUR Clear 12/24/2009 0927   LABSPEC 1.010 03/15/2010 1806   PHURINE 7.0 03/15/2010 1806   HGBUR NEGATIVE 03/15/2010 1806   HGBUR negative 12/24/2009 0927   BILIRUBINUR n 04/10/2014 1011   KETONESUR NEGATIVE 03/15/2010 1806   PROTEINUR n 04/10/2014 1011   PROTEINUR NEGATIVE 03/15/2010 1806   UROBILINOGEN 0.2 04/10/2014 1011   UROBILINOGEN 0.2 03/15/2010 1806   NITRITE n 04/10/2014 1011   NITRITE NEGATIVE 03/15/2010 1806   LEUKOCYTESUR Negative 04/10/2014 1011    Radiological Exams on Admission: DG Chest 2 View  Result Date: 02/09/2020 CLINICAL DATA:  80 year old male with aspiration EXAM: CHEST - 2 VIEW COMPARISON:  03/01/2018, 02/25/2016 FINDINGS: Cardiomediastinal silhouette unchanged in size and contour, with the right heart border is now obscured by right lung opacity. Air-fluid level of the right lung obscuring the right hemidiaphragm and the right heart border. No pneumothorax. No left-sided pleural fluid.  Coarsened  interstitial markings. Opacity at the posterior lung base on the lateral view corresponds to the findings on the frontal. No displaced fracture.  Degenerative changes of the spine. IMPRESSION: New right pleural effusion with associated atelectasis/consolidation. Aspiration pneumonitis/pneumonia not excluded. Electronically Signed   By: Corrie Mckusick D.O.   On: 02/09/2020 10:15    EKG: Independently reviewed. As above  Assessment/Plan Active Problems:   * No active hospital problems. *  R pleural effusion. Doubt infectious we will continue to hold antibiotics and wait for patient to present himself. -Interestingly, with his cirrhosis and portal hypertension I do wonder if this is the etiology, he has a history of colon cancer and wonder if malignant in nature.  I will consult pulmonology to see how they would like to pursue this. --wonder if low albumin contirbuting, awaiting hepatic function panel --INR, BNP ---Pulmonology consult called. --placed LDH order  Chronic conditions Cirrhosis with history of ascites-continue furosemide and spironolactone to start tomorrow, heart healthy diet, but patient knows to limit sodium Parkinson's disease-hold Adderall for now, continue sinemet,  malnourished -- BMI 18.31, encourage good po intake  DVT prophylaxis: Heparin SQ despite INR of 1.4 in cirrhotic patient Code Status: Full code  I had a prolonged conversation, 5 minutes, he wanted to remain full code although he was thinking about DNR and he said he would speak this over with his daughter.  Sitter continuing these conversation Family Communication: Daughter at bedside  Disposition Plan: suppose home as patient will feel better after thoracentesis   Consults called: Pulmonology provider, male Admission status: observation because    A total of 72 minutes utilized during this admission.  [facility]   If 7PM-7AM, please contact  night-coverage [email or web address] Password [facility]  02/09/2020, 5:51 PM

## 2020-02-09 NOTE — ED Notes (Signed)
MD at bedside. 

## 2020-02-09 NOTE — ED Notes (Signed)
Transport put in via [employer]

## 2020-02-09 NOTE — ED Notes (Signed)
Attempt report x1  

## 2020-02-10 DIAGNOSIS — M3489 Other systemic sclerosis: Secondary | ICD-10-CM | POA: Diagnosis present

## 2020-02-10 DIAGNOSIS — H908 Mixed conductive and sensorineural hearing loss, unspecified: Secondary | ICD-10-CM | POA: Diagnosis present

## 2020-02-10 DIAGNOSIS — E871 Hypo-osmolality and hyponatremia: Secondary | ICD-10-CM | POA: Diagnosis present

## 2020-02-10 DIAGNOSIS — E872 Acidosis: Secondary | ICD-10-CM | POA: Diagnosis present

## 2020-02-10 DIAGNOSIS — R7303 Prediabetes: Secondary | ICD-10-CM | POA: Diagnosis present

## 2020-02-10 DIAGNOSIS — R0600 Dyspnea, unspecified: Secondary | ICD-10-CM | POA: Diagnosis present

## 2020-02-10 DIAGNOSIS — G4733 Obstructive sleep apnea (adult) (pediatric): Secondary | ICD-10-CM | POA: Diagnosis present

## 2020-02-10 DIAGNOSIS — R188 Other ascites: Secondary | ICD-10-CM | POA: Diagnosis present

## 2020-02-10 DIAGNOSIS — R64 Cachexia: Secondary | ICD-10-CM | POA: Diagnosis present

## 2020-02-10 DIAGNOSIS — I2510 Atherosclerotic heart disease of native coronary artery without angina pectoris: Secondary | ICD-10-CM | POA: Diagnosis present

## 2020-02-10 DIAGNOSIS — J90 Pleural effusion, not elsewhere classified: Secondary | ICD-10-CM | POA: Diagnosis present

## 2020-02-10 DIAGNOSIS — N400 Enlarged prostate without lower urinary tract symptoms: Secondary | ICD-10-CM | POA: Diagnosis present

## 2020-02-10 DIAGNOSIS — K7460 Unspecified cirrhosis of liver: Secondary | ICD-10-CM | POA: Diagnosis present

## 2020-02-10 DIAGNOSIS — E43 Unspecified severe protein-calorie malnutrition: Secondary | ICD-10-CM | POA: Diagnosis present

## 2020-02-10 DIAGNOSIS — Z681 Body mass index (BMI) 19 or less, adult: Secondary | ICD-10-CM | POA: Diagnosis not present

## 2020-02-10 DIAGNOSIS — J948 Other specified pleural conditions: Secondary | ICD-10-CM | POA: Diagnosis present

## 2020-02-10 DIAGNOSIS — N179 Acute kidney failure, unspecified: Secondary | ICD-10-CM | POA: Diagnosis present

## 2020-02-10 DIAGNOSIS — K2970 Gastritis, unspecified, without bleeding: Secondary | ICD-10-CM | POA: Diagnosis present

## 2020-02-10 DIAGNOSIS — R471 Dysarthria and anarthria: Secondary | ICD-10-CM | POA: Diagnosis present

## 2020-02-10 DIAGNOSIS — G609 Hereditary and idiopathic neuropathy, unspecified: Secondary | ICD-10-CM | POA: Diagnosis present

## 2020-02-10 DIAGNOSIS — G20 Parkinson's disease: Secondary | ICD-10-CM | POA: Diagnosis present

## 2020-02-10 DIAGNOSIS — K766 Portal hypertension: Secondary | ICD-10-CM | POA: Diagnosis present

## 2020-02-10 DIAGNOSIS — K219 Gastro-esophageal reflux disease without esophagitis: Secondary | ICD-10-CM | POA: Diagnosis present

## 2020-02-10 DIAGNOSIS — R932 Abnormal findings on diagnostic imaging of liver and biliary tract: Secondary | ICD-10-CM | POA: Diagnosis not present

## 2020-02-10 DIAGNOSIS — J9601 Acute respiratory failure with hypoxia: Secondary | ICD-10-CM | POA: Diagnosis not present

## 2020-02-10 DIAGNOSIS — G3281 Cerebellar ataxia in diseases classified elsewhere: Secondary | ICD-10-CM | POA: Diagnosis present

## 2020-02-10 DIAGNOSIS — I313 Pericardial effusion (noninflammatory): Secondary | ICD-10-CM | POA: Diagnosis not present

## 2020-02-10 DIAGNOSIS — Z20822 Contact with and (suspected) exposure to covid-19: Secondary | ICD-10-CM | POA: Diagnosis present

## 2020-02-10 DIAGNOSIS — J9811 Atelectasis: Secondary | ICD-10-CM | POA: Diagnosis present

## 2020-02-10 LAB — COMPREHENSIVE METABOLIC PANEL
ALT: 21 U/L (ref 0–44)
AST: 20 U/L (ref 15–41)
Albumin: 2.6 g/dL — ABNORMAL LOW (ref 3.5–5.0)
Alkaline Phosphatase: 83 U/L (ref 38–126)
Anion gap: 15 (ref 5–15)
BUN: 28 mg/dL — ABNORMAL HIGH (ref 8–23)
CO2: 19 mmol/L — ABNORMAL LOW (ref 22–32)
Calcium: 9 mg/dL (ref 8.9–10.3)
Chloride: 96 mmol/L — ABNORMAL LOW (ref 98–111)
Creatinine, Ser: 1.14 mg/dL (ref 0.61–1.24)
GFR, Estimated: >60 mL/min (ref >60)
Glucose, Bld: 140 mg/dL — ABNORMAL HIGH (ref 70–99)
Potassium: 4.3 mmol/L (ref 3.5–5.1)
Sodium: 130 mmol/L — ABNORMAL LOW (ref 135–145)
Total Bilirubin: 2.8 mg/dL — ABNORMAL HIGH (ref 0.3–1.2)
Total Protein: 6.4 g/dL — ABNORMAL LOW (ref 6.5–8.1)

## 2020-02-10 LAB — CBC WITH DIFFERENTIAL/PLATELET
Abs Immature Granulocytes: 0.02 10*3/uL (ref 0.00–0.07)
Basophils Absolute: 0 10*3/uL (ref 0.0–0.1)
Basophils Relative: 0 %
Eosinophils Absolute: 0 10*3/uL (ref 0.0–0.5)
Eosinophils Relative: 1 %
HCT: 33.7 % — ABNORMAL LOW (ref 39.0–52.0)
Hemoglobin: 12.4 g/dL — ABNORMAL LOW (ref 13.0–17.0)
Immature Granulocytes: 0 %
Lymphocytes Relative: 9 %
Lymphs Abs: 0.5 10*3/uL — ABNORMAL LOW (ref 0.7–4.0)
MCH: 32 pg (ref 26.0–34.0)
MCHC: 36.8 g/dL — ABNORMAL HIGH (ref 30.0–36.0)
MCV: 86.9 fL (ref 80.0–100.0)
Monocytes Absolute: 0.6 10*3/uL (ref 0.1–1.0)
Monocytes Relative: 11 %
Neutro Abs: 4.3 10*3/uL (ref 1.7–7.7)
Neutrophils Relative %: 79 %
Platelets: 123 10*3/uL — ABNORMAL LOW (ref 150–400)
RBC: 3.88 MIL/uL — ABNORMAL LOW (ref 4.22–5.81)
RDW: 13.5 % (ref 11.5–15.5)
WBC: 5.4 10*3/uL (ref 4.0–10.5)
nRBC: 0 % (ref 0.0–0.2)

## 2020-02-10 LAB — HEMOGLOBIN A1C
Hgb A1c MFr Bld: 6 % — ABNORMAL HIGH (ref 4.8–5.6)
Mean Plasma Glucose: 125.5 mg/dL

## 2020-02-10 LAB — LACTATE DEHYDROGENASE: LDH: 105 U/L (ref 98–192)

## 2020-02-10 MED ORDER — FUROSEMIDE 10 MG/ML IJ SOLN
40.0000 mg | Freq: Once | INTRAMUSCULAR | Status: AC
  Administered 2020-02-10: 40 mg via INTRAVENOUS
  Filled 2020-02-10: qty 4

## 2020-02-10 MED ORDER — ENOXAPARIN SODIUM 40 MG/0.4ML ~~LOC~~ SOLN
40.0000 mg | Freq: Every day | SUBCUTANEOUS | Status: DC
  Administered 2020-02-10 – 2020-02-15 (×6): 40 mg via SUBCUTANEOUS
  Filled 2020-02-10 (×6): qty 0.4

## 2020-02-10 MED ORDER — MORPHINE SULFATE (PF) 2 MG/ML IV SOLN: 0.5000 mg | INTRAVENOUS | Status: DC | PRN

## 2020-02-10 MED ORDER — WHITE PETROLATUM EX OINT
TOPICAL_OINTMENT | CUTANEOUS | Status: AC
  Filled 2020-02-10: qty 28.35

## 2020-02-10 MED ORDER — LORAZEPAM 2 MG/ML IJ SOLN
0.5000 mg | Freq: Once | INTRAMUSCULAR | Status: DC
  Filled 2020-02-10: qty 1

## 2020-02-10 MED ORDER — SODIUM CHLORIDE 0.9% FLUSH
10.0000 mL | Freq: Three times a day (TID) | INTRAVENOUS | Status: DC
  Administered 2020-02-10: 3 mL

## 2020-02-10 MED ORDER — FENTANYL CITRATE (PF) 100 MCG/2ML IJ SOLN
12.5000 ug | Freq: Once | INTRAMUSCULAR | Status: DC
  Filled 2020-02-10: qty 2

## 2020-02-10 NOTE — Evaluation (Signed)
Physical Therapy Evaluation Patient Details Name: [name] MRN: [id number] DOB: Jul 03, 1940 Today's Date: 02/10/2020   History of Present Illness  80 y.o. male admitted with chest tightness and difficulty breathing.  PMH pertinent for parkinsons GERD and history of esophageal stricture, cirrhosis of liver with portal hypertension and esophageal varices and thrombocytopenia, OSA on CPAP,, A. fib, aortic valve disorder, anemia, BPH, CAD, history of colon cancer status post chemotherapy which he thinks caused cirrhosis. Workup reveals pleural effusion.  Clinical Impression  Patient overall did well with mobility, mostly limited by breathing difficulty and decreased balance.  Feel patient will progress back to baseline as his medical condition improves.  May need a cane initially for balance.  Will benefit from continued PT to address balance and mobility for return to baseline functioning.    Follow Up Recommendations No PT follow up    Equipment Recommendations  None recommended by PT    Recommendations for Other Services       Precautions / Restrictions        Mobility  Bed Mobility Overal bed mobility: Needs Assistance Bed Mobility: Supine to Sit     Supine to sit: Supervision     General bed mobility comments: used bed railing    Transfers Overall transfer level: Needs assistance   Transfers: Sit to/from Stand Sit to Stand: Min guard         General transfer comment: min guard for safety and management of lines/leads  Ambulation/Gait Ambulation/Gait assistance: Min guard Gait Distance (Feet): 20 Feet Assistive device: 1 person hand held assist Gait Pattern/deviations: Step-through pattern Gait velocity: decreased   General Gait Details: overall, did well with ambulation; no loss of balance noted  Stairs            Wheelchair Mobility    Modified Rankin (Stroke Patients Only)       Balance Overall balance assessment: Needs  assistance Sitting-balance support: Feet supported;No upper extremity supported Sitting balance-Leahy Scale: Good     Standing balance support: No upper extremity supported Standing balance-Leahy Scale: Fair                               Pertinent Vitals/Pain Pain Assessment: No/denies pain    Home Living Family/patient expects to be discharged to:: Private residence Living Arrangements: Spouse/significant other Available Help at Discharge: Family;Available PRN/intermittently Type of Home: House Home Access: Stairs to enter   Entrance Stairs-Number of Steps: 3 Home Layout: One level        Prior Function Level of Independence: Independent         Comments: drives; cares for wife     Hand Dominance        Extremity/Trunk Assessment   Upper Extremity Assessment Upper Extremity Assessment: Overall WFL for tasks assessed    Lower Extremity Assessment Lower Extremity Assessment: Overall WFL for tasks assessed    Cervical / Trunk Assessment Cervical / Trunk Assessment: Kyphotic  Communication   Communication: No difficulties  Cognition Arousal/Alertness: Awake/alert Behavior During Therapy: WFL for tasks assessed/performed Overall Cognitive Status: Within Functional Limits for tasks assessed                                        General Comments      Exercises     Assessment/Plan    PT Assessment Patient needs continued  PT services  PT Problem List Decreased activity tolerance;Decreased balance;Decreased mobility;Cardiopulmonary status limiting activity       PT Treatment Interventions Gait training;Functional mobility training;Therapeutic activities;Therapeutic exercise;Balance training;Patient/family education    PT Goals (Current goals can be found in the Care Plan section)  Acute Rehab PT Goals Patient Stated Goal: breath better PT Goal Formulation: With patient Time For Goal Achievement: 02/16/20 Potential to  Achieve Goals: Good    Frequency Min 3X/week   Barriers to discharge Decreased caregiver support patient is caregiver for wife    Co-evaluation               AM-PAC PT "6 Clicks" Mobility  Outcome Measure Help needed turning from your back to your side while in a flat bed without using bedrails?: None Help needed moving from lying on your back to sitting on the side of a flat bed without using bedrails?: A Little Help needed moving to and from a bed to a chair (including a wheelchair)?: A Little Help needed standing up from a chair using your arms (e.g., wheelchair or bedside chair)?: A Little Help needed to walk in hospital room?: A Little Help needed climbing 3-5 steps with a railing? : A Little 6 Click Score: 19    End of Session Equipment Utilized During Treatment: Oxygen Activity Tolerance: Patient tolerated treatment well Patient left: in chair;with call bell/phone within reach Nurse Communication: Mobility status PT Visit Diagnosis: Unsteadiness on feet (R26.81);Other abnormalities of gait and mobility (R26.89)    Time: 1050-1110 PT Time Calculation (min) (ACUTE ONLY): 20 min   Charges:   PT Evaluation $PT Eval Moderate Complexity: 1 Mod          02/10/2020 Margie, PT Acute Rehabilitation Services Pager:  [phone] Office:  [phone]    Olivia Canter 02/10/2020, 11:20 AM

## 2020-02-10 NOTE — Progress Notes (Signed)
PROGRESS NOTE    [name]  [id number] DOB: December 11, 1940 DOA: 02/09/2020 PCP: Burnard Bunting, MD  Chief Complaint  Patient presents with  . Chest Pain  . Shortness of Breath   Brief Narrative:  [name] is [name] 80 y.o. male with [name] pertinent history of  parkinsons GERD and history of esophageal stricture, cirrhosis of liver with portal hypertension and esophageal varices and thrombocytopenia, OSA on CPAP,, Russell Aul. fib, aortic valve disorder, anemia, BPH, CAD, history of colon cancer status post chemotherapy which he thinks caused cirrhosis.  He states he has noticed Right lower chest pain, pleuriticin nature and he can't get [name] good breath.  He states they really noticed it yesterday after patient was eating [name] and [name] hamburger and had an aspiration event of hamburger they think.  He denies any fevers or chills or sick contacts but since then.  We thought this could have been coincidental as it takes something for someone to notice it.  Daughter helps corroborate the story.  In the emergency department 118/73, 98.1, HR 95, 95% on room air, RR 18.  WBC 8.9, Hgb 12.8, PLT 149, NA 130, K4.5, CL 93, CO2 26, SCR 1.40, glucose 150  CTa IMPRESSION: 1. No definite evidence of pulmonary embolus. 2. Large right pleural effusion is noted which results in right to left mediastinal shift and complete atelectasis of the right lower lobe. Partial atelectasis of the right upper and middle lobes is noted. Minimal left pleural effusion is noted with adjacent subsegmental atelectasis. No abx were given.  SLP previously showed mild aspiration risk.  Assessment & Plan:   Active Problems:   Pleural effusion on right   Pleural effusion  Right Sided Pleural Effusion CT with large R pleural effusion  pulm consult, appreciate assistance - planning for pigtail catheter placement in end Continue lasix, spironolactone Lower suspicion for infection at this time, continue to monitor   Cirrhosis  with hx ascites Continue lasix, spironolactone  AKI  NAGMA: creatinine improving, follow with diuresis   Parkinsons Continue sinemet Holding adderall for now  Hx colon cancer S/p colectomy and chemotherapy Follows with Dr. Benay Spice - in clinical remission   Anemia  Thrombocytopenia: continue to monitor   Hyperglycemia: follow a1c  Body mass index is 18.31 kg/m.  DVT prophylaxis: heparin Code Status: full  Family Communication: none at bedside Disposition:   Status is: Inpatient  Remains inpatient appropriate because:Inpatient level of care appropriate due to severity of illness   Dispo: The patient is from: Home              Anticipated d/c is to: pending              Anticipated d/c date is: > 3 days              Patient currently is not medically stable to d/c.  Consultants:   pulm   Procedures:  none  Antimicrobials:  Anti-infectives (From admission, onward)   None         Subjective: Complains of SOB  Objective: Vitals:   02/09/20 2118 02/09/20 2130 02/09/20 2215 02/10/20 0148  BP:  133/60 (!) 126/51 109/66  Pulse:  (!) 101 (!) 104 92  Resp: 19 20 17 17   Temp:  98.4 F (36.9 C) 98.2 F (36.8 C) 98 F (36.7 C)  TempSrc:   Oral Oral  SpO2:  98% 94% 95%  Weight:      Height:        Intake/Output  Summary (Last 24 hours) at 02/10/2020 1711 Last data filed at 02/10/2020 1646 Gross per 24 hour  Intake --  Output 1350 ml  Net -1350 ml   Filed Weights   02/09/20 0937  Weight: 61.2 kg    Examination:  General exam: Appears calm and comfortable  Respiratory system: diminished on R Cardiovascular system: S1 & S2 heard, RRR. Gastrointestinal system: Abdomen is nondistended, soft and nontender Central nervous system: Alert and oriented. No focal neurological deficits. Extremities: no LEE Skin: No rashes, lesions or ulcers Psychiatry: Judgement and insight appear normal. Mood & affect appropriate.     Data Reviewed: I have personally  reviewed following labs and imaging studies  CBC: Recent Labs  Lab 02/09/20 0946 02/10/20 1014  WBC 8.9 5.4  NEUTROABS  --  4.3  HGB 12.8* 12.4*  HCT 37.5* 33.7*  MCV 89.5 86.9  PLT 149* 123*    Basic Metabolic Panel: Recent Labs  Lab 02/09/20 0946 02/10/20 1014  NA 130* 130*  K 4.5 4.3  CL 93* 96*  CO2 26 19*  GLUCOSE 150* 140*  BUN 27* 28*  CREATININE 1.40* 1.14  CALCIUM 9.2 9.0    GFR: Estimated Creatinine Clearance: 46.2 mL/min (by C-G formula based on SCr of 1.14 mg/dL).  Liver Function Tests: Recent Labs  Lab 02/09/20 2010 02/10/20 1014  AST 20 20  ALT 16 21  ALKPHOS 89 83  BILITOT 3.3* 2.8*  PROT 6.9 6.4*  ALBUMIN 2.8* 2.6*    CBG: No results for input(s): GLUCAP in the last 168 hours.   Recent Results (from the past 240 hour(s))  Resp Panel by RT-PCR (Flu [name]&B, Covid) Nasopharyngeal Swab     Status: None   Collection Time: 02/09/20  5:28 PM   Specimen: Nasopharyngeal Swab; Nasopharyngeal(NP) swabs in vial transport medium  Result Value Ref Range Status   SARS Coronavirus 2 by RT PCR NEGATIVE NEGATIVE Final    Comment: (NOTE) SARS-CoV-2 target nucleic acids are NOT DETECTED.  The SARS-CoV-2 RNA is generally detectable in upper respiratory specimens during the acute phase of infection. The lowest concentration of SARS-CoV-2 viral copies this assay can detect is 138 copies/mL. [name] negative result does not preclude SARS-Cov-2 infection and should not be used as the sole basis for treatment or other patient management decisions. [name] negative result may occur with  improper specimen collection/handling, submission of specimen other than nasopharyngeal swab, presence of viral mutation(s) within the areas targeted by this assay, and inadequate number of viral copies(<138 copies/mL). [name] negative result must be combined with clinical observations, patient history, and epidemiological information. The expected result is Negative.  Fact Sheet for  Patients:  EntrepreneurPulse.com.au  Fact Sheet for Healthcare Providers:  IncredibleEmployment.be  This test is no t yet approved or cleared by the [place] FDA and  has been authorized for detection and/or diagnosis of SARS-CoV-2 by FDA under an Emergency Use Authorization (EUA). This EUA will remain  in effect (meaning this test can be used) for the duration of the COVID-19 declaration under Section 564(b)(1) of the Act, 21 U.S.C.section 360bbb-3(b)(1), unless the authorization is terminated  or revoked sooner.       Influenza [name] by PCR NEGATIVE NEGATIVE Final   Influenza B by PCR NEGATIVE NEGATIVE Final    Comment: (NOTE) The Xpert Xpress SARS-CoV-2/FLU/RSV plus assay is intended as an aid in the diagnosis of influenza from Nasopharyngeal swab specimens and should not be used as [name] sole basis for treatment. Nasal washings and aspirates are  unacceptable for Xpert Xpress SARS-CoV-2/FLU/RSV testing.  Fact Sheet for Patients: EntrepreneurPulse.com.au  Fact Sheet for Healthcare Providers: IncredibleEmployment.be  This test is not yet approved or cleared by the [place] FDA and has been authorized for detection and/or diagnosis of SARS-CoV-2 by FDA under an Emergency Use Authorization (EUA). This EUA will remain in effect (meaning this test can be used) for the duration of the COVID-19 declaration under Section 564(b)(1) of the Act, 21 U.S.C. section 360bbb-3(b)(1), unless the authorization is terminated or revoked.  Performed at [facility], [address] [place], [place] [address]          Radiology Studies: DG Chest 2 View  Result Date: 02/09/2020 CLINICAL DATA:  80 year old male with aspiration EXAM: CHEST - 2 VIEW COMPARISON:  03/01/2018, 02/25/2016 FINDINGS: Cardiomediastinal silhouette unchanged in size and contour, with the right heart border is now obscured by right lung  opacity. Air-fluid level of the right lung obscuring the right hemidiaphragm and the right heart border. No pneumothorax. No left-sided pleural fluid.  Coarsened interstitial markings. Opacity at the posterior lung base on the lateral view corresponds to the findings on the frontal. No displaced fracture.  Degenerative changes of the spine. IMPRESSION: New right pleural effusion with associated atelectasis/consolidation. Aspiration pneumonitis/pneumonia not excluded. Electronically Signed   By: Corrie Mckusick D.O.   On: 02/09/2020 10:15   CT Angio Chest PE W/Cm &/Or Wo Cm  Result Date: 02/09/2020 CLINICAL DATA:  High probability of pulmonary embolus. EXAM: CT ANGIOGRAPHY CHEST WITH CONTRAST TECHNIQUE: Multidetector CT imaging of the chest was performed using the standard protocol during bolus administration of intravenous contrast. Multiplanar CT image reconstructions and MIPs were obtained to evaluate the vascular anatomy. CONTRAST:  87mL OMNIPAQUE IOHEXOL 350 MG/ML SOLN COMPARISON:  Jun 12, 2014. FINDINGS: Cardiovascular: Satisfactory opacification of the pulmonary arteries to the segmental level. No evidence of pulmonary embolism. Normal heart size. No pericardial effusion. Mediastinum/Nodes: No enlarged mediastinal, hilar, or axillary lymph nodes. Thyroid gland, trachea, and esophagus demonstrate no significant findings. Lungs/Pleura: No pneumothorax is noted. Large right pleural effusion is noted which results in right to left mediastinal shift. Complete atelectasis of the right lower lobe is noted. Partial atelectasis of the right upper and middle lobes is noted. Minimal left pleural effusion is noted with adjacent subsegmental atelectasis. Upper Abdomen: No acute abnormality. Musculoskeletal: No chest wall abnormality. No acute or significant osseous findings. Review of the MIP images confirms the above findings. IMPRESSION: 1. No definite evidence of pulmonary embolus. 2. Large right pleural effusion is  noted which results in right to left mediastinal shift and complete atelectasis of the right lower lobe. Partial atelectasis of the right upper and middle lobes is noted. Minimal left pleural effusion is noted with adjacent subsegmental atelectasis. Electronically Signed   By: Marijo Conception M.D.   On: 02/09/2020 15:50        Scheduled Meds: . carbidopa-levodopa  1 tablet Oral BID AC  . fentaNYL (SUBLIMAZE) injection  12.5 mcg Intravenous Once  . furosemide  40 mg Oral Daily  . heparin  5,000 Units Subcutaneous Q8H  . LORazepam  0.5 mg Intravenous Once  . pantoprazole  40 mg Oral BID  . propranolol  20 mg Oral BID  . sodium chloride flush  10 mL Intracatheter Q8H  . spironolactone  100 mg Oral Daily   Continuous Infusions:   LOS: 0 days    Time spent: over 30 min    Fayrene Helper, MD [facility]  To contact the attending provider between 7A-7P or the covering provider during after hours 7P-7A, please log into the web site [email or web address] and access using universal [place] password for that web site. If you do not have the password, please call the hospital operator.  02/10/2020, 5:11 PM

## 2020-02-10 NOTE — Progress Notes (Signed)
Patient's family refused SQ heparin for prophylactic DVT despite explanation due to thrombocytopenia and will talk to the doctor about this.  Will endorse to day shift RN accordingly.

## 2020-02-10 NOTE — Consult Note (Signed)
NAME:  [name], MRN:  [id number], DOB:  09-24-1940, LOS: 0 ADMISSION DATE:  02/09/2020, CONSULTATION DATE:  02/10/20 REFERRING MD:  Marcha Dutton - TR[facility], CHIEF COMPLAINT: SOB:  Pleural Effusion  Brief History:  80 yo M with cirrhosis among other things, admitted w SOB ? Aspiration. Has big  R sided pleural effusion   History of Present Illness:  80 yo M PMH cirrhosis, portal hypertension, esophageal varices, thrombocytopenia, parkinsons, Afib (not on anticoag) who presented to ED 1/1 with SOB, R sided chest pain after perceived aspiration event. Chest pain pleuritic in nature and limited to R side, lower chest. No fevers chills, cough, sick contacts.  In ED, CTA chest revealed large R sided pleural effusion.   PCCM consulted for evaluation of pleural effusion.   Past Medical History:  Cirrhosis Prtal Hypertension Esophageal stricture Esophageal varices Thrombocytopenia OSA on CPAP Afib Aortic regurgitation Anemia BPH CAD Hx Colon cancer Parkinsons Peripheral neuropathy Diverticulosis HLD GIB Significant Hospital Events:  1/1 admitted to [facility] for SOB. Found to have large R pleural effusion   Consults:  PCCM  Procedures:    Significant Diagnostic Tests:  02/09/20 CTA chest> no PE. Large R sided pleural effusion with R to L mediastinal shift. R sided atelectasis. Small L pleural effusion.   Micro Data:  1/1 SARS Cov 2> neg 1/1 Flu A/B> neg   Antimicrobials:    Interim History / Subjective:   States that he continues to be mildly dyspneic.  Objective   Blood pressure 109/66, pulse 92, temperature 98 F (36.7 C), temperature source Oral, resp. rate 17, height 6' (1.829 m), weight 61.2 kg, SpO2 95 %.        Intake/Output Summary (Last 24 hours) at 02/10/2020 1122 Last data filed at 02/10/2020 1041 Gross per 24 hour  Intake --  Output 300 ml  Net -300 ml   Filed Weights   02/09/20 0937  Weight: 61.2 kg    Examination: General: awake, cachectic HENT: dry mucus  membranes Lungs: decreased breath sounds with crackles on the right.  Cardiovascular: JVP not elevated. HS normal Abdomen: scaphoid with no tenderness. Extremities: no edema. Neuro: mask-like facies, dysarthric speech, parkinson's tremor GU: deferred.   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  serum LDH normal  -less favored to be infectious P -is on lasix 40 mg spironolactone 100mg  at home, continued inpatient  -additional 40 mg IV lasix now -sodium restriction   - Pigtail catheter placement in endoscopy.   Cirrhosis of liver Portal hypertension Esophageal varices Thrombocytopenia, mild  Hyperbilirubinemia  Coagulopathy, mild     Best practice (evaluated daily)  Diet: sodium restriction Pain/Anxiety/Delirium protocol (if indicated): na VAP protocol (if indicated): na DVT prophylaxis: Sq Heparin GI prophylaxis: protonix Glucose control: monitor Mobility: fall precautions Disposition:med surg   Goals of Care:  Last date of multidisciplinary goals of care discussion:-- Family and staff present: -- Summary of discussion: --  Follow up goals of care discussion due: -- Code Status: Full   Labs   CBC: Recent Labs  Lab 02/09/20 0946 02/10/20 1014  WBC 8.9 5.4  NEUTROABS  --  4.3  HGB 12.8* 12.4*  HCT 37.5* 33.7*  MCV 89.5 86.9  PLT 149*  123*    Basic Metabolic Panel: Recent Labs  Lab 02/09/20 0946 02/10/20 1014  NA 130* 130*  K 4.5 4.3  CL 93* 96*  CO2 26 19*  GLUCOSE 150* 140*  BUN 27* 28*  CREATININE 1.40* 1.14  CALCIUM 9.2 9.0   GFR: Estimated Creatinine Clearance: 46.2 mL/min (by C-G formula based on SCr of 1.14 mg/dL). Recent Labs  Lab 02/09/20 0946  02/10/20 1014  WBC 8.9 5.4    Liver Function Tests: Recent Labs  Lab 02/09/20 2010 02/10/20 1014  AST 20 20  ALT 16 21  ALKPHOS 89 83  BILITOT 3.3* 2.8*  PROT 6.9 6.4*  ALBUMIN 2.8* 2.6*   No results for input(s): LIPASE, AMYLASE in the last 168 hours. No results for input(s): AMMONIA in the last 168 hours.  ABG    Component Value Date/Time   TCO2 22 11/23/2013 1540     Coagulation Profile: Recent Labs  Lab 02/09/20 2010  INR 1.4*    Cardiac Enzymes: No results for input(s): CKTOTAL, CKMB, CKMBINDEX, TROPONINI in the last 168 hours.  HbA1C: Hgb A1c MFr Bld  Date/Time Value Ref Range Status  12/16/2008 11:20 AM 5.0 4.6 - 6.5 % Final    Comment:    See lab report for associated comment(s)  12/13/2007 12:15 PM 5.1 4.6 - 6.0 % Final    Comment:    See lab report for associated comment(s)    CBG: No results for input(s): GLUCAP in the last 168 hours.  Review of Systems:   Review of Systems  Constitutional: Positive for weight loss.  Respiratory: Positive for shortness of breath.   Cardiovascular: Negative.   Gastrointestinal: Negative for abdominal pain.  Genitourinary: Negative.   Musculoskeletal: Negative.   Skin: Negative.   Neurological: Positive for tremors.  Endo/Heme/Allergies: Negative.   Psychiatric/Behavioral: Negative.      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), Esophageal stricture, Esophageal varices determined by endoscopy ([place]) (sept 2016), Fibrosis of liver, Fracture of distal femur ([place]) (11/29/2013), GERD (gastroesophageal reflux  disease) (03-31-11), 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), Hernia, abdominal, History of colon cancer (02/25/2016), History of esophageal varices, History of hemorrhoids, History of irregular heartbeat, Hypercholesterolemia, HYPERGLYCEMIA, FASTING (12/13/2007), Hyperlipidemia, Malignant neoplasm of ascending colon ([place]) (03/03/2011), Maxillary sinusitis, acute (05/06/2015), 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   Surgical History:   Past Surgical History:  Procedure Laterality Date  . APPENDECTOMY  03/2011  . CARDIAC CATHETERIZATION  07/2002   Dr Wynonia Lawman  . COLON SURGERY  2013  . COLONOSCOPY  sept 2016   upper endo and colonoscopy  . ESOPHAGEAL DILATION  2006  . ESOPHAGOGASTRODUODENOSCOPY (EGD) WITH PROPOFOL N/A 02/25/2016   Procedure: ESOPHAGOGASTRODUODENOSCOPY (EGD) WITH PROPOFOL;  Surgeon: Manus Gunning, MD;  Location: [facility];  Service: Gastroenterology;  Laterality: N/A;  . ESOPHAGOGASTRODUODENOSCOPY (EGD) WITH PROPOFOL N/A  03/10/2016   Procedure: ESOPHAGOGASTRODUODENOSCOPY (EGD) WITH PROPOFOL;  Surgeon: Manus Gunning, MD;  Location: [facility];  Service: Gastroenterology;  Laterality: N/A;  . GASTRIC VARICES BANDING N/A 03/10/2016   Procedure: GASTRIC VARICES BANDING;  Surgeon: Manus Gunning, MD;  Location: [facility];  Service: Gastroenterology;  Laterality: N/A;  . INGUINAL HERNIA REPAIR Left ~ 1991  .  LAPAROSCOPIC PARTIAL COLECTOMY  04/08/2011  . ORIF DISTAL FEMUR FRACTURE Right 11/29/2013   "put plate in"  . ORIF FEMUR FRACTURE Right 11/29/2013   Procedure: OPEN REDUCTION INTERNAL FIXATION (ORIF) RIGHT  DISTAL FEMUR FRACTURE;  Surgeon: Rozanna Box, MD;  Location: [place];  Service: Orthopedics;  Laterality: Right;  . PORT-A-CATH REMOVAL  11/09/2011   Procedure: REMOVAL PORT-A-CATH;  Surgeon: Odis Hollingshead, MD;  Location: [place];  Service: General;  Laterality: N/A;  . PORTACATH PLACEMENT  04/30/2011   Procedure: INSERTION PORT-A-CATH;  Surgeon: Odis Hollingshead, MD;  Location: [place];  Service: General;  Laterality: Right;     Social History:   reports that he quit smoking about 58 years ago. His smoking use included cigarettes. He has a 3.50 pack-year smoking history. He has never used smokeless tobacco. He reports that he does not drink alcohol and does not use drugs.   Family History:  His family history includes Cancer in his brother; Colon cancer in his sister and another family member; Colon cancer (age of onset: 70) in his brother; Coronary artery disease in his brother; Diabetes in his father; Heart attack (age of onset: 56) in his father; Heart failure in his mother; Lung cancer in his brother; Prostate cancer in his father; Stomach cancer in his sister. There is no history of Anesthesia problems or Esophageal cancer.   Allergies No Known Allergies   Home Medications  Prior to Admission medications   Medication Sig Start Date End Date Taking? Authorizing Provider  acetaminophen (TYLENOL) 500 MG tablet Take 1,000 mg by mouth every 6 (six) hours as needed for mild pain or headache.   Yes [provider]  amphetamine-dextroamphetamine (ADDERALL) 30 MG tablet Take 30 mg by mouth daily as needed (for awake in the morning). 12/30/10  Yes [provider]  Artificial Tear Ointment (DRY EYES OP) Apply 1 drop to eye daily.   Yes [provider]  carbidopa-levodopa (SINEMET CR) 50-200 MG tablet TAKE 1 TABLET BY MOUTH TWICE DAILY 1 HOUR BEFORE BREAKFAST AND BEFORE DINNER Patient taking differently: Take 1 tablet by mouth 2 (two) times daily. 01/01/20  Yes Dohmeier, Asencion Partridge, MD  ferrous sulfate 325 (65 FE) MG tablet Take 325 mg by mouth 2 (two) times daily.   Yes [provider]  fluticasone (FLONASE) 50 MCG/ACT nasal spray Place 2 sprays into both nostrils at bedtime as needed for allergies or rhinitis.   Yes [provider]  furosemide (LASIX) 40 MG tablet Take 40 mg by mouth daily.    Yes [provider]  pantoprazole (PROTONIX) 40 MG tablet Take 1 tablet (40 mg total) by mouth 2 (two) times daily. 02/29/16  Yes Donne Hazel, MD  propranolol (INDERAL) 20 MG tablet Take 1 tablet (20 mg total) by mouth 2 (two) times daily. 02/29/16  Yes Donne Hazel, MD  spironolactone (ALDACTONE) 100 MG tablet Take 100 mg by mouth daily.  07/18/18  Yes [provider]    Kipp Brood, MD [facility] ICU Physician [place]  Pager: [phone] Or Epic Secure Chat After hours:  [phone].  02/10/2020, 3:25 PM

## 2020-02-11 ENCOUNTER — Encounter (HOSPITAL_COMMUNITY): Payer: Self-pay | Admitting: Pulmonary Disease

## 2020-02-11 ENCOUNTER — Inpatient Hospital Stay (HOSPITAL_COMMUNITY): Payer: Medicare Other

## 2020-02-11 ENCOUNTER — Encounter (HOSPITAL_COMMUNITY)
Admission: EM | Disposition: A | Discharge status: Home / Self Care | Payer: Self-pay | Source: Home / Self Care | Attending: Family Medicine

## 2020-02-11 DIAGNOSIS — J90 Pleural effusion, not elsewhere classified: Secondary | ICD-10-CM | POA: Diagnosis not present

## 2020-02-11 DIAGNOSIS — L8990 Pressure ulcer of unspecified site, unspecified stage: Secondary | ICD-10-CM | POA: Insufficient documentation

## 2020-02-11 HISTORY — PX: THORACENTESIS: SHX235

## 2020-02-11 LAB — COMPREHENSIVE METABOLIC PANEL
ALT: 13 U/L (ref 0–44)
AST: 29 U/L (ref 15–41)
Albumin: 2.6 g/dL — ABNORMAL LOW (ref 3.5–5.0)
Alkaline Phosphatase: 84 U/L (ref 38–126)
Anion gap: 12 (ref 5–15)
BUN: 35 mg/dL — ABNORMAL HIGH (ref 8–23)
CO2: 25 mmol/L (ref 22–32)
Calcium: 9 mg/dL (ref 8.9–10.3)
Chloride: 94 mmol/L — ABNORMAL LOW (ref 98–111)
Creatinine, Ser: 1.37 mg/dL — ABNORMAL HIGH (ref 0.61–1.24)
GFR, Estimated: 53 mL/min — ABNORMAL LOW (ref >60)
Glucose, Bld: 126 mg/dL — ABNORMAL HIGH (ref 70–99)
Potassium: 4.8 mmol/L (ref 3.5–5.1)
Sodium: 131 mmol/L — ABNORMAL LOW (ref 135–145)
Total Bilirubin: 2.1 mg/dL — ABNORMAL HIGH (ref 0.3–1.2)
Total Protein: 6.5 g/dL (ref 6.5–8.1)

## 2020-02-11 LAB — CBC WITH DIFFERENTIAL/PLATELET
Abs Immature Granulocytes: 0.03 10*3/uL (ref 0.00–0.07)
Basophils Absolute: 0 10*3/uL (ref 0.0–0.1)
Basophils Relative: 0 %
Eosinophils Absolute: 0 10*3/uL (ref 0.0–0.5)
Eosinophils Relative: 1 %
HCT: 33.6 % — ABNORMAL LOW (ref 39.0–52.0)
Hemoglobin: 12.5 g/dL — ABNORMAL LOW (ref 13.0–17.0)
Immature Granulocytes: 1 %
Lymphocytes Relative: 8 %
Lymphs Abs: 0.5 10*3/uL — ABNORMAL LOW (ref 0.7–4.0)
MCH: 31.9 pg (ref 26.0–34.0)
MCHC: 37.2 g/dL — ABNORMAL HIGH (ref 30.0–36.0)
MCV: 85.7 fL (ref 80.0–100.0)
Monocytes Absolute: 0.6 10*3/uL (ref 0.1–1.0)
Monocytes Relative: 11 %
Neutro Abs: 4.4 10*3/uL (ref 1.7–7.7)
Neutrophils Relative %: 79 %
Platelets: 161 10*3/uL (ref 150–400)
RBC: 3.92 MIL/uL — ABNORMAL LOW (ref 4.22–5.81)
RDW: 13.4 % (ref 11.5–15.5)
WBC: 5.5 10*3/uL (ref 4.0–10.5)
nRBC: 0 % (ref 0.0–0.2)

## 2020-02-11 LAB — BODY FLUID CELL COUNT WITH DIFFERENTIAL
Eos, Fluid: 0 %
Lymphs, Fluid: 51 %
Monocyte-Macrophage-Serous Fluid: 19 % — ABNORMAL LOW (ref 50–90)
Neutrophil Count, Fluid: 30 % — ABNORMAL HIGH (ref 0–25)
Total Nucleated Cell Count, Fluid: 816 cu mm (ref 0–1000)

## 2020-02-11 LAB — MAGNESIUM: Magnesium: 1.9 mg/dL (ref 1.7–2.4)

## 2020-02-11 LAB — LACTATE DEHYDROGENASE: LDH: 160 U/L (ref 98–192)

## 2020-02-11 LAB — ALBUMIN: Albumin: 2.9 g/dL — ABNORMAL LOW (ref 3.5–5.0)

## 2020-02-11 LAB — GLUCOSE, PLEURAL OR PERITONEAL FLUID: Glucose, Fluid: 168 mg/dL

## 2020-02-11 LAB — PHOSPHORUS: Phosphorus: 3.7 mg/dL (ref 2.5–4.6)

## 2020-02-11 LAB — PROTEIN, PLEURAL OR PERITONEAL FLUID: Total protein, fluid: <3 g/dL

## 2020-02-11 LAB — LACTATE DEHYDROGENASE, PLEURAL OR PERITONEAL FLUID: LD, Fluid: 82 U/L — ABNORMAL HIGH (ref 3–23)

## 2020-02-11 LAB — AMYLASE, PLEURAL OR PERITONEAL FLUID: Amylase, Fluid: 21 U/L

## 2020-02-11 SURGERY — THORACENTESIS
Anesthesia: LOCAL | Laterality: Right

## 2020-02-11 MED ORDER — TRAZODONE HCL 50 MG PO TABS
50.0000 mg | ORAL_TABLET | Freq: Once | ORAL | Status: AC
  Administered 2020-02-11: 50 mg via ORAL
  Filled 2020-02-11: qty 1

## 2020-02-11 MED ORDER — RESOURCE THICKENUP CLEAR PO POWD
ORAL | Status: DC | PRN
  Filled 2020-02-11: qty 125

## 2020-02-11 MED ORDER — LIDOCAINE HCL (PF) 1 % IJ SOLN
INTRAMUSCULAR | Status: AC
  Filled 2020-02-11: qty 30

## 2020-02-11 MED ORDER — NAPHAZOLINE-GLYCERIN 0.012-0.2 % OP SOLN
1.0000 [drp] | Freq: Four times a day (QID) | OPHTHALMIC | Status: DC | PRN
  Administered 2020-02-11: 1 [drp] via OPHTHALMIC
  Filled 2020-02-11: qty 15

## 2020-02-11 NOTE — Consult Note (Signed)
NAME:  [name], MRN:  [id number], DOB:  [birth year], LOS: 1 ADMISSION DATE:  02/09/2020, CONSULTATION DATE:  02/10/20 REFERRING MD:  Marcha Dutton - [facility], CHIEF COMPLAINT: SOB:  Pleural Effusion  Brief History:  80 yo M with cirrhosis among other things, admitted w SOB ? Aspiration. Has big  R sided pleural effusion   History of Present Illness:  80 yo M PMH cirrhosis, portal hypertension, esophageal varices, thrombocytopenia, parkinsons, Afib (not on anticoag) who presented to ED 1/1 with SOB, R sided chest pain after perceived aspiration event. Chest pain pleuritic in nature and limited to R side, lower chest. No fevers chills, cough, sick contacts.  In ED, CTA chest revealed large R sided pleural effusion.   PCCM consulted for evaluation of pleural effusion.   Past Medical History:  Cirrhosis Prtal Hypertension Esophageal stricture Esophageal varices Thrombocytopenia OSA on CPAP Afib Aortic regurgitation Anemia BPH CAD Hx Colon cancer Parkinsons Peripheral neuropathy Diverticulosis HLD GIB Significant Hospital Events:  1/1 admitted to [facility] for SOB. Found to have large R pleural effusion   Consults:  PCCM  Procedures:    Significant Diagnostic Tests:  02/09/20 CTA chest> no PE. Large R sided pleural effusion with R to L mediastinal shift. R sided atelectasis. Small L pleural effusion.   Micro Data:  1/1 SARS Cov 2> neg 1/1 Flu A/B> neg   Antimicrobials:    Interim History / Subjective:  Reports shortness of breath. Discussed thoracentesis vs chest tube. Will schedule for thoracentesis for diagnostic evaluation. Patient agreed but requested me to call family. Updated son and daughter-in-law (nurse) who agreed to plan.  Objective   Blood pressure 91/61, pulse 90, temperature 98 F (36.7 C), temperature source Oral, resp. rate 17, height 6' (1.829 m), weight 61.2 kg, SpO2 97 %.        Intake/Output Summary (Last 24 hours) at 02/11/2020 [id number] Last data filed at 02/11/2020  [id number] Gross per 24 hour  Intake 0 ml  Output 1351 ml  Net -1351 ml   Filed Weights   02/09/20 [id number]  Weight: 61.2 kg   Physical Exam: General: Elderly, cachectic, no acute distress HENT: [place], AT, OP clear, MMM Eyes: EOMI, no scleral icterus Respiratory: Diminished right breath sounds Cardiovascular: RRR, -M/R/G, no JVD GI: BS+, soft, nontender Extremities:-Edema,-tenderness Neuro: Awake, alert x 3, dysarthric speech, parkinson's tremor  Resolved Hospital Problem list     Assessment & Plan:   Large right sided pleural effusion with R to L mediastinal shift  -in setting of hepatic dysfunction, 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  serum LDH normal  -less favored to be infectious -Recommend therapeutic and diagnostic thoracentesis. Would not recommend chest tube as no evidence of infection at this time and if this is hepatic hydrothorax, this could result in complications including continuous fluid reaccumulation and electrolyte shifts.  Plan -Thoracentesis scheduled at 2pm -Continue home lasix 40 mg spironolactone 100mg  at home -Sodium restriction     Cirrhosis of liver Portal hypertension Esophageal varices Thrombocytopenia, mild  Hyperbilirubinemia  Coagulopathy, mild     Best practice (evaluated daily)  Diet: sodium restriction Pain/Anxiety/Delirium protocol (if indicated): na VAP protocol (if indicated): na DVT prophylaxis: Sq Heparin GI prophylaxis: protonix Glucose control: monitor Mobility: fall precautions Disposition:med surg   Goals of Care:  Last date of multidisciplinary goals of care discussion:-- Family and staff present: -- Summary of discussion: --  Follow up goals of care discussion due: -- Code Status: Full   Labs   CBC: Recent Labs  Lab 02/09/20 0946 02/10/20 1014 02/11/20 0238  WBC 8.9 5.4 5.5   NEUTROABS  --  4.3 4.4  HGB 12.8* 12.4* 12.5*  HCT 37.5* 33.7* 33.6*  MCV 89.5 86.9 85.7  PLT 149* 123* 161    Basic Metabolic Panel: Recent Labs  Lab 02/09/20 0946 02/10/20 1014 02/11/20 0238  NA 130* 130* 131*  K 4.5 4.3 4.8  CL 93* 96* 94*  CO2 26 19* 25  GLUCOSE 150* 140* 126*  BUN 27* 28* 35*  CREATININE 1.40* 1.14 1.37*  CALCIUM 9.2 9.0 9.0  MG  --   --  1.9  PHOS  --   --  3.7   GFR: Estimated Creatinine Clearance: 38.5 mL/min (A) (by C-G formula based on SCr of 1.37 mg/dL (H)). Recent Labs  Lab 02/09/20 0946 02/10/20 1014 02/11/20 0238  WBC 8.9 5.4 5.5    Liver Function Tests: Recent Labs  Lab 02/09/20 2010 02/10/20 1014 02/11/20 0238  AST 20 20 29   ALT 16 21 13   ALKPHOS 89 83 84  BILITOT 3.3* 2.8* 2.1*  PROT 6.9 6.4* 6.5  ALBUMIN 2.8* 2.6* 2.6*   No results for input(s): LIPASE, AMYLASE in the last 168 hours. No results for input(s): AMMONIA in the last 168 hours.  ABG    Component Value Date/Time   TCO2 22 11/23/2013 1540     Coagulation Profile: Recent Labs  Lab 02/09/20 2010  INR 1.4*    Cardiac Enzymes: No results for input(s): CKTOTAL, CKMB, CKMBINDEX, TROPONINI in the last 168 hours.  HbA1C: Hgb A1c MFr Bld  Date/Time Value Ref Range Status  02/10/2020 10:14 AM 6.0 (H) 4.8 - 5.6 % Final    Comment:    (NOTE) Pre diabetes:          5.7%-6.4%  Diabetes:              >6.4%  Glycemic control for   <7.0% adults with diabetes   12/16/2008 11:20 AM 5.0 4.6 - 6.5 % Final    Comment:    See lab report for associated comment(s)    CBG: No results for input(s): GLUCAP in the last 168 hours.  I have spent a total time of 45-minutes on the day of the appointment reviewing prior documentation, coordinating care and discussing medical diagnosis and plan with the patient/family. Imaging, labs and tests included in this note have been reviewed and interpreted independently by me.  04/09/2020, M.D. [facility] Pulmonary/Critical  Care Medicine 02/11/2020 9:07 AM

## 2020-02-11 NOTE — Procedures (Addendum)
Thoracentesis  Procedure Note  KENDRICK HAAPALA  [id number]  [birth year]  Date:02/11/20  Time:2:41 PM   Provider Performing:Mayrene Bastarache Mechele Collin   Procedure: Thoracentesis with imaging guidance ([id number])  Indication(s) Pleural Effusion  Consent Risks of the procedure as well as the alternatives and risks of each were explained to the patient and/or caregiver.  Consent for the procedure was obtained and is signed in the bedside chart  Anesthesia Topical only with 1% lidocaine    Time Out Verified patient identification, verified procedure, site/side was marked, verified correct patient position, special equipment/implants available, medications/allergies/relevant history reviewed, required imaging and test results available.   Sterile Technique Maximal sterile technique including full sterile barrier drape, hand hygiene, sterile gown, sterile gloves, mask, hair covering, sterile ultrasound probe cover (if used).  Procedure Description Ultrasound was used to identify appropriate pleural anatomy for placement and overlying skin marked.  Area of drainage cleaned and draped in sterile fashion. Lidocaine was used to anesthetize the skin and subcutaneous tissue.  1400 cc's of straw colored appearing fluid was drained from the right pleural space between the 7th and 8th rib space. Catheter then removed and bandaid applied to site.   Complications/Tolerance None; patient tolerated the procedure well. Chest X-ray is ordered to confirm no post-procedural complication. ADDENDUM: No pneumothorax. Stable moderate right pleural effusion   EBL Minimal   Specimen(s) Pleural fluid  Mechele Collin, M.D. [facility] Pulmonary/Critical Care Medicine 02/11/2020 2:42 PM

## 2020-02-11 NOTE — Progress Notes (Signed)
PROGRESS NOTE    [name]  [id number] DOB: [birth year] DOA: 02/09/2020 PCP: Burnard Bunting, MD  Chief Complaint  Patient presents with  . Chest Pain  . Shortness of Breath   Brief Narrative:  [name] is [name] 80 y.o. male with [name] pertinent history of  parkinsons GERD and history of esophageal stricture, cirrhosis of liver with portal hypertension and esophageal varices and thrombocytopenia, OSA on CPAP,, [name]. fib, aortic valve disorder, anemia, BPH, CAD, history of colon cancer status post chemotherapy which he thinks caused cirrhosis.  He states he has noticed Right lower chest pain, pleuriticin nature and he can't get [name] good breath.  He states they really noticed it yesterday after patient was eating [name] hamburger and had an aspiration event of hamburger they think.  He denies any fevers or chills or sick contacts but since then.  We thought this could have been coincidental as it takes something for someone to notice it.  Daughter helps corroborate the story.  In the emergency department 118/73, 98.1, HR 95, 95% on room air, RR 18.  WBC 8.9, Hgb 12.8, PLT 149, NA 130, K4.5, CL 93, CO2 26, SCR 1.40, glucose 150  CTa IMPRESSION: 1. No definite evidence of pulmonary embolus. 2. Large right pleural effusion is noted which results in right to left mediastinal shift and complete atelectasis of the right lower lobe. Partial atelectasis of the right upper and middle lobes is noted. Minimal left pleural effusion is noted with adjacent subsegmental atelectasis. No abx were given.  SLP previously showed mild aspiration risk.  Assessment & Plan:   Active Problems:   Pleural effusion on right   Pleural effusion   Pressure injury of skin  Right Sided Pleural Effusion CT with large R pleural effusion  pulm consult, appreciate assistance S/p thoracentesis by pulm 1/3 -> 1400 cc straw colored fluid Follow culture.  Transudative by lights. Continue lasix,  spironolactone Lower suspicion for infection at this time, continue to monitor   Cirrhosis with hx ascites Continue lasix, spironolactone - hold until tomorrow follow trend   AKI  NAGMA: creatinine worse today, hold lasix/spiro for now, see trend tomorrow first  Parkinsons Continue sinemet Holding adderall for now  Hx colon cancer S/p colectomy and chemotherapy Follows with Dr. Benay Spice - in clinical remission   Anemia  Thrombocytopenia: continue to monitor   Prediabetes: follow a1c (6), follow outpatient   Body mass index is 18.31 kg/m.  DVT prophylaxis: heparin Code Status: full  Family Communication: none at bedside Disposition:   Status is: Inpatient  Remains inpatient appropriate because:Inpatient level of care appropriate due to severity of illness   Dispo: The patient is from: Home              Anticipated d/c is to: pending              Anticipated d/c date is: > 3 days              Patient currently is not medically stable to d/c.  Consultants:   pulm   Procedures:  none  Antimicrobials:  Anti-infectives (From admission, onward)   None         Subjective: Continued SOB  Objective: Vitals:   02/11/20 0825 02/11/20 1249 02/11/20 1400 02/11/20 1440  BP: 91/61 118/80 125/65 97/66  Pulse: 90 (!) 115 92 86  Resp: 17  20 (!) 22  Temp: 98 F (36.7 C)  97.7 F (36.5 C) 97.8 F (36.6  C)  TempSrc: Oral  Oral Oral  SpO2: 97% 97% 97% 97%  Weight:      Height:        Intake/Output Summary (Last 24 hours) at 02/11/2020 1647 Last data filed at 02/11/2020 1627 Gross per 24 hour  Intake 200 ml  Output 1651 ml  Net -1451 ml   Filed Weights   02/09/20 0937  Weight: 61.2 kg    Examination:  General: No acute distress. Cardiovascular: Heart sounds show [name] regular rate, and rhythm Lungs: diminished on R Abdomen: Soft, nontender, nondistended  Neurological: Alert and oriented 3. Moves all extremities 4. Cranial nerves II through XII grossly  intact. Skin: Warm and dry. No rashes or lesions. Extremities: No clubbing or cyanosis. No edema     Data Reviewed: I have personally reviewed following labs and imaging studies  CBC: Recent Labs  Lab 02/09/20 0946 02/10/20 1014 02/11/20 0238  WBC 8.9 5.4 5.5  NEUTROABS  --  4.3 4.4  HGB 12.8* 12.4* 12.5*  HCT 37.5* 33.7* 33.6*  MCV 89.5 86.9 85.7  PLT 149* 123* 161    Basic Metabolic Panel: Recent Labs  Lab 02/09/20 0946 02/10/20 1014 02/11/20 0238  NA 130* 130* 131*  K 4.5 4.3 4.8  CL 93* 96* 94*  CO2 26 19* 25  GLUCOSE 150* 140* 126*  BUN 27* 28* 35*  CREATININE 1.40* 1.14 1.37*  CALCIUM 9.2 9.0 9.0  MG  --   --  1.9  PHOS  --   --  3.7    GFR: Estimated Creatinine Clearance: 38.5 mL/min (Jamelyn Bovard) (by C-G formula based on SCr of 1.37 mg/dL (H)).  Liver Function Tests: Recent Labs  Lab 02/09/20 2010 02/10/20 1014 02/11/20 0238 02/11/20 1058  AST 20 20 29   --   ALT 16 21 13   --   ALKPHOS 89 83 84  --   BILITOT 3.3* 2.8* 2.1*  --   PROT 6.9 6.4* 6.5  --   ALBUMIN 2.8* 2.6* 2.6* 2.9*    CBG: No results for input(s): GLUCAP in the last 168 hours.   Recent Results (from the past 240 hour(s))  Resp Panel by RT-PCR (Flu Daimen Shovlin&B, Covid) Nasopharyngeal Swab     Status: None   Collection Time: 02/09/20  5:28 PM   Specimen: Nasopharyngeal Swab; Nasopharyngeal(NP) swabs in vial transport medium  Result Value Ref Range Status   SARS Coronavirus 2 by RT PCR NEGATIVE NEGATIVE Final    Comment: (NOTE) SARS-CoV-2 target nucleic acids are NOT DETECTED.  The SARS-CoV-2 RNA is generally detectable in upper respiratory specimens during the acute phase of infection. The lowest concentration of SARS-CoV-2 viral copies this assay can detect is 138 copies/mL. [name] negative result does not preclude SARS-Cov-2 infection and should not be used as the sole basis for treatment or other patient management decisions. [name] negative result may occur with  improper specimen  collection/handling, submission of specimen other than nasopharyngeal swab, presence of viral mutation(s) within the areas targeted by this assay, and inadequate number of viral copies(<138 copies/mL). [name] negative result must be combined with clinical observations, patient history, and epidemiological information. The expected result is Negative.  Fact Sheet for Patients:   Fact Sheet for Healthcare Providers:  04/08/20  This test is no t yet approved or cleared by the [employer] FDA and  has been authorized for detection and/or diagnosis of SARS-CoV-2 by FDA under an Emergency Use Authorization (EUA). This EUA will remain  in effect (meaning this test  can be used) for the duration of the COVID-19 declaration under Section 564(b)(1) of the Act, 21 U.S.C.section 360bbb-3(b)(1), unless the authorization is terminated  or revoked sooner.       Influenza Troyce Gieske by PCR NEGATIVE NEGATIVE Final   Influenza B by PCR NEGATIVE NEGATIVE Final    Comment: (NOTE) The Xpert Xpress SARS-CoV-2/FLU/RSV plus assay is intended as an aid in the diagnosis of influenza from Nasopharyngeal swab specimens and should not be used as Tennyson Wacha sole basis for treatment. Nasal washings and aspirates are unacceptable for Xpert Xpress SARS-CoV-2/FLU/RSV testing.  Fact Sheet for Patients: EntrepreneurPulse.com.au  Fact Sheet for Healthcare Providers: IncredibleEmployment.be  This test is not yet approved or cleared by the [place] FDA and has been authorized for detection and/or diagnosis of SARS-CoV-2 by FDA under an Emergency Use Authorization (EUA). This EUA will remain in effect (meaning this test can be used) for the duration of the COVID-19 declaration under Section 564(b)(1) of the Act, 21 U.S.C. section 360bbb-3(b)(1), unless the authorization is terminated or revoked.  Performed at [facility], [place] [address], [place], [place] [address]          Radiology Studies: DG CHEST PORT 1 VIEW  Result Date: 02/11/2020 CLINICAL DATA:  Status post thoracentesis. EXAM: PORTABLE CHEST 1 VIEW COMPARISON:  February 09, 2020. FINDINGS: Moderate right pleural effusion is unchanged, with probable underlying atelectasis or infiltrate. No pneumothorax is noted. Left lung is clear. Bony thorax is unremarkable. IMPRESSION: Stable moderate right pleural effusion with probable underlying atelectasis or infiltrate. No pneumothorax is noted. Electronically Signed   By: Marijo Conception M.D.   On: 02/11/2020 15:16        Scheduled Meds: . carbidopa-levodopa  1 tablet Oral BID AC  . enoxaparin (LOVENOX) injection  40 mg Subcutaneous QHS  . fentaNYL (SUBLIMAZE) injection  12.5 mcg Intravenous Once  . furosemide  40 mg Oral Daily  . LORazepam  0.5 mg Intravenous Once  . pantoprazole  40 mg Oral BID  . propranolol  20 mg Oral BID  . spironolactone  100 mg Oral Daily   Continuous Infusions:   LOS: 1 day    Time spent: over 30 min    Fayrene Helper, MD [facility]   To contact the attending provider between 7A-7P or the covering provider during after hours 7P-7A, please log into the web site [email or web address] and access using universal [place] password for that web site. If you do not have the password, please call the hospital operator.  02/11/2020, 4:47 PM

## 2020-02-11 NOTE — Evaluation (Signed)
Clinical/Bedside Swallow Evaluation Patient Details  Name: [name] MRN: [id number] Date of Birth: August 14, 1940  Today's Date: 02/11/2020 Time: SLP Start Time (ACUTE ONLY): 0950 SLP Stop Time (ACUTE ONLY): 1015 SLP Time Calculation (min) (ACUTE ONLY): 25 min  Past Medical History:  Past Medical History:  Diagnosis Date  . Anemia   . Aortic valve disorder 07/17/2018  . Arthritis    "thumbs" (11/29/2013)  . Atrial fibrillation [facility]) 2006   Dr Franki Monte af-no meds  . Bleeding esophageal varices (HCC)   . BPH (benign prostatic hypertrophy)   . CAD (coronary artery disease)    Mild by Cath 2004. Calcification with mild irregularity.  30% diagonal.  Cardiolite 8/13 normal EF and negative for ischemia (chronically positive ETT)   . CAP (community acquired pneumonia) 02/12/2013  . 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 Disorder of bilirubin excretion   . Diverticulosis    Sigmoid colon  . 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 Esophageal stricture   . Esophageal varices determined by endoscopy (HCC) sept 2016  . Fibrosis of liver   . Fracture of distal femur (HCC) 11/29/2013  . GERD (gastroesophageal reflux disease) 03-31-11   tx. Omeprazole  . 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 Hernia, abdominal    at the umbilicus  . History of colon cancer 02/25/2016  . History of esophageal varices   . History of hemorrhoids   . History of irregular heartbeat   . Hypercholesterolemia   . HYPERGLYCEMIA, FASTING 12/13/2007  . Hyperlipidemia   . Malignant neoplasm of ascending colon (HCC) 03/03/2011   s/p right colectomy 04/08/11 Dr. Purnell Shoemaker Genetic counseling recommendations:  SURVEILLANCE:  Because of the increased risk for cancer in you and your family which we cannot yet define through genetic testing, we urge you and family members to pursue screenings that are recommended for individuals at increased risk of Lynch syndrome  (HNPCC)-associated cancers. The following measures are recommend  . Maxillary sinusitis, acute 05/06/2015  . 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 Nephrolithiasis   . Neuropathy 2013   as a result of chemo  . Obstructive sleep apnea 12/14/2007  . OSA on CPAP dx'd 2010   uses cap nightly  . Oxygen deficiency   . Parkinsonism (HCC) 10/24/2017  . Personal history of colonic polyps 09/17/2009   Tubular adenoma  . Pneumonia 01/2013  . 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 Portal hypertension with esophageal varices (HCC) 10/24/2017  . Resting tremor 10/24/2017  . Shortness of breath at rest   . Sleep apnea    uses c pap at home  . TBI (traumatic brain injury) (HCC) 1986   S/P fall; "slow to get going q morning" (10//22/2015)  . Thrombocytopenia (HCC) 02/25/2016  . Titubation 10/24/2017  . Upper GI bleed 02/25/2016  . Viral URI with cough 01/24/2013   Past Surgical History:  Past Surgical History:  Procedure Laterality Date  . APPENDECTOMY  03/2011  . CARDIAC CATHETERIZATION  07/2002   Dr Donnie Aho  . COLON SURGERY  2013  . COLONOSCOPY  sept 2016   upper endo and colonoscopy  . ESOPHAGEAL DILATION  2006  . ESOPHAGOGASTRODUODENOSCOPY (EGD) WITH PROPOFOL N/A 02/25/2016   Procedure: ESOPHAGOGASTRODUODENOSCOPY (EGD) WITH PROPOFOL;  Surgeon: Ruffin Frederick, MD;  Location: [facility];  Service: Gastroenterology;  Laterality: N/A;  . ESOPHAGOGASTRODUODENOSCOPY (EGD) WITH PROPOFOL N/A 03/10/2016  Procedure: ESOPHAGOGASTRODUODENOSCOPY (EGD) WITH PROPOFOL;  Surgeon: Manus Gunning, MD;  Location: [facility];  Service: Gastroenterology;  Laterality: N/A;  . GASTRIC VARICES BANDING N/A 03/10/2016   Procedure: GASTRIC VARICES BANDING;  Surgeon: Manus Gunning, MD;  Location: [facility];  Service: Gastroenterology;  Laterality: N/A;  . INGUINAL HERNIA REPAIR Left  ~ 1991  . LAPAROSCOPIC PARTIAL COLECTOMY  04/08/2011  . ORIF DISTAL FEMUR FRACTURE Right 11/29/2013   "put plate in"  . ORIF FEMUR FRACTURE Right 11/29/2013   Procedure: OPEN REDUCTION INTERNAL FIXATION (ORIF) RIGHT  DISTAL FEMUR FRACTURE;  Surgeon: Rozanna Box, MD;  Location: [place];  Service: Orthopedics;  Laterality: Right;  . PORT-A-CATH REMOVAL  11/09/2011   Procedure: REMOVAL PORT-A-CATH;  Surgeon: Odis Hollingshead, MD;  Location: [place];  Service: General;  Laterality: N/A;  . PORTACATH PLACEMENT  04/30/2011   Procedure: INSERTION PORT-A-CATH;  Surgeon: Odis Hollingshead, MD;  Location: [place];  Service: General;  Laterality: Right;   HPI:  80yo male admitted 02/09/20 with SOB: pleural effusion after perceived aspiration event. PMH: cirrhosis, portal HTN, esophageal stricture/varicies, OSA on CPAP, aortic regurg, anemia, BPH, CAD, thrombocytopenia, parkinson's, R pleural effusion, AFib, peripheral neuropathy, HLD, GIB, colon cancer in remission   Assessment / Plan / Recommendation Clinical Impression  Pt was seen for clinical swallow evaluation. Pt was resting in bed upon arrival of SLP, and requested transfer to recliner to eat breakfast. Pt was noted to be very talkative throughout evaluation, despite reporting significant SOB. Pt CN exam is unremarkable. Volitional cough is weak. Pt reports difficulty swallowing due to increased fluid and esophageal varices. Of note, pt was seen in OP clinic by speech therapy in early 2020. Notes revealed that pt has a history of silent aspiration on thin liquids, and had been given HEP and RSMT device. Pt reports he does continue to complete the exercises, but "not as often as needed".   Pt was observed during breakfast of thin liquids, puree, and soft chopped solids. Pt appears to tolerate all consistencies without overt s/s aspiration, however, shortly after the swallow pt brings up what he calls "thick mucus". Pt is at significantly  high risk for aspiration, due to several issues including Parkinson's disease, history of silent aspiration, esophageal deficits, and increased fluid. Pt is scheduled for thoracentesis this afternoon. Will downgrade diet to more conservative Dys2/NTL and follow up after thoracentesis to determine appropriate next step. RN and MD informed.  SLP Visit Diagnosis: Dysphagia, unspecified (R13.10)    Aspiration Risk  Moderate aspiration risk;Risk for inadequate nutrition/hydration    Diet Recommendation Dysphagia 2 (Fine chop);Nectar-thick liquid   Liquid Administration via: Cup Medication Administration: Whole meds with puree Supervision: Patient able to self feed;Intermittent supervision to cue for compensatory strategies;Staff to assist with self feeding Compensations: Minimize environmental distractions;Slow rate;Small sips/bites Postural Changes: Seated upright at 90 degrees;Remain upright for at least 30 minutes after po intake    Other  Recommendations Recommended Consults: Consider esophageal assessment Oral Care Recommendations: Oral care BID Other Recommendations: Order thickener from pharmacy;Have oral suction available;Clarify dietary restrictions   Follow up Recommendations Skilled Nursing facility      Frequency and Duration min 1 x/week  1 week;2 weeks       Prognosis Prognosis for Safe Diet Advancement: Guarded Barriers to Reach Goals: Other (Comment) (multi-system issues with parkinsons, pharyngeal and esophageal isses, as well as deconditioning.)      Swallow Study   General Date of Onset: 02/09/20 HPI:  80yo male admitted 02/09/20 with SOB: pleural effusion after perceived aspiration event. PMH: cirrhosis, portal HTN, esophageal stricture/varicies, OSA on CPAP, aortic regurg, anemia, BPH, CAD, thrombocytopenia, parkinson's, R pleural effusion, AFib, peripheral neuropathy, HLD, GIB, colon cancer in remission Type of Study: Bedside Swallow Evaluation Previous Swallow  Assessment: pt seen for ST services in OP clinic in early 2020 Diet Prior to this Study: Regular;Thin liquids Temperature Spikes Noted: No Respiratory Status: Nasal cannula History of Recent Intubation: No Behavior/Cognition: Alert;Cooperative;Pleasant mood;Requires cueing Oral Cavity Assessment: Within Functional Limits Oral Care Completed by SLP: No Vision: Functional for self-feeding Self-Feeding Abilities: Able to feed self Patient Positioning: Upright in chair Baseline Vocal Quality: Normal Volitional Cough: Weak Volitional Swallow: Able to elicit    Oral/Motor/Sensory Function Overall Oral Motor/Sensory Function: Within functional limits   Ice Chips Ice chips: Not tested   Thin Liquid Thin Liquid: Within functional limits Presentation: Cup;Self Fed    Puree Puree: Within functional limits Presentation: Self Fed;Spoon   Solid     Solid: Within functional limits Presentation: Warm [place] B. Quentin Ore, [facility], [place] Speech Language Pathologist Office: [phone] Pager: [phone]  Shonna Chock 02/11/2020,10:51 AM

## 2020-02-11 NOTE — Progress Notes (Signed)
  Speech Language Pathology Treatment: Dysphagia  Patient Details Name: [name] MRN: [id number] DOB: February 17, 1940 Today's Date: 02/11/2020 Time: 1015-1040 SLP Time Calculation (min) (ACUTE ONLY): 25 min  Assessment / Plan / Recommendation Clinical Impression  Pt was seen for education following bedside swallow evaluation. SLP reviewed results and recommendations from swallow eval, and explained that his diet will be downgraded to dys 2 and nectar thick liquids to maximize safety prior to his thoracentesis today. Pt is at high risk for aspiration and dysphagia given Parkinson's disease, deconditioning, and history of pharyngeal and esophageal issues.    Also recommend consideration of Palliative Care consult to facilitate establishment of appropriate goals of care for this gentleman.    HPI HPI: 80yo male admitted 02/09/20 with SOB: pleural effusion after perceived aspiration event. PMH: cirrhosis, portal HTN, esophageal stricture/varicies, OSA on CPAP, aortic regurg, anemia, BPH, CAD, thrombocytopenia, parkinson's, R pleural effusion, AFib, peripheral neuropathy, HLD, GIB, colon cancer in remission      SLP Plan  Continue with current plan of care       Recommendations  Diet recommendations: Nectar-thick liquid;Dysphagia 2 (fine chop) Liquids provided via: Cup Medication Administration: Whole meds with puree Supervision: Patient able to self feed;Staff to assist with self feeding;Intermittent supervision to cue for compensatory strategies (assist PRN) Compensations: Minimize environmental distractions;Slow rate;Small sips/bites Postural Changes and/or Swallow Maneuvers: Seated upright 90 degrees;Upright 30-60 min after meal;Out of bed for meals                Oral Care Recommendations: Oral care BID Follow up Recommendations: Skilled Nursing facility SLP Visit Diagnosis: Dysphagia, unspecified (R13.10) Plan: Continue with current plan of care       GO           Javiana Anwar  B. Murvin Natal, [facility], CCC-SLP Speech Language Pathologist Office: [phone] Pager: [phone]  Leigh Aurora 02/11/2020, 11:07 AM

## 2020-02-11 NOTE — Progress Notes (Signed)
Received report from EGD that 1.4 liters drained. Pt vitals are stable. Returning to unit.

## 2020-02-12 ENCOUNTER — Inpatient Hospital Stay (HOSPITAL_COMMUNITY): Payer: Medicare Other

## 2020-02-12 DIAGNOSIS — J948 Other specified pleural conditions: Secondary | ICD-10-CM

## 2020-02-12 DIAGNOSIS — K766 Portal hypertension: Secondary | ICD-10-CM | POA: Diagnosis not present

## 2020-02-12 DIAGNOSIS — J90 Pleural effusion, not elsewhere classified: Secondary | ICD-10-CM | POA: Diagnosis not present

## 2020-02-12 DIAGNOSIS — K7460 Unspecified cirrhosis of liver: Secondary | ICD-10-CM | POA: Diagnosis not present

## 2020-02-12 DIAGNOSIS — R188 Other ascites: Secondary | ICD-10-CM

## 2020-02-12 LAB — CBC WITH DIFFERENTIAL/PLATELET
Abs Immature Granulocytes: 0.04 10*3/uL (ref 0.00–0.07)
Basophils Absolute: 0 10*3/uL (ref 0.0–0.1)
Basophils Relative: 0 %
Eosinophils Absolute: 0 10*3/uL (ref 0.0–0.5)
Eosinophils Relative: 1 %
HCT: 33.6 % — ABNORMAL LOW (ref 39.0–52.0)
Hemoglobin: 11.9 g/dL — ABNORMAL LOW (ref 13.0–17.0)
Immature Granulocytes: 1 %
Lymphocytes Relative: 11 %
Lymphs Abs: 0.5 10*3/uL — ABNORMAL LOW (ref 0.7–4.0)
MCH: 30.6 pg (ref 26.0–34.0)
MCHC: 35.4 g/dL (ref 30.0–36.0)
MCV: 86.4 fL (ref 80.0–100.0)
Monocytes Absolute: 0.5 10*3/uL (ref 0.1–1.0)
Monocytes Relative: 11 %
Neutro Abs: 3.8 10*3/uL (ref 1.7–7.7)
Neutrophils Relative %: 76 %
Platelets: 122 10*3/uL — ABNORMAL LOW (ref 150–400)
RBC: 3.89 MIL/uL — ABNORMAL LOW (ref 4.22–5.81)
RDW: 13.2 % (ref 11.5–15.5)
WBC: 4.9 10*3/uL (ref 4.0–10.5)
nRBC: 0 % (ref 0.0–0.2)

## 2020-02-12 LAB — COMPREHENSIVE METABOLIC PANEL
ALT: 10 U/L (ref 0–44)
AST: 15 U/L (ref 15–41)
Albumin: 2.5 g/dL — ABNORMAL LOW (ref 3.5–5.0)
Alkaline Phosphatase: 87 U/L (ref 38–126)
Anion gap: 9 (ref 5–15)
BUN: 34 mg/dL — ABNORMAL HIGH (ref 8–23)
CO2: 23 mmol/L (ref 22–32)
Calcium: 8.4 mg/dL — ABNORMAL LOW (ref 8.9–10.3)
Chloride: 97 mmol/L — ABNORMAL LOW (ref 98–111)
Creatinine, Ser: 1.12 mg/dL (ref 0.61–1.24)
GFR, Estimated: >60 mL/min (ref >60)
Glucose, Bld: 122 mg/dL — ABNORMAL HIGH (ref 70–99)
Potassium: 4.3 mmol/L (ref 3.5–5.1)
Sodium: 129 mmol/L — ABNORMAL LOW (ref 135–145)
Total Bilirubin: 1.4 mg/dL — ABNORMAL HIGH (ref 0.3–1.2)
Total Protein: 6.4 g/dL — ABNORMAL LOW (ref 6.5–8.1)

## 2020-02-12 LAB — MAGNESIUM: Magnesium: 1.9 mg/dL (ref 1.7–2.4)

## 2020-02-12 LAB — TRIGLYCERIDES, BODY FLUIDS: Triglycerides, Fluid: 26 mg/dL

## 2020-02-12 LAB — CYTOLOGY - NON PAP

## 2020-02-12 LAB — PHOSPHORUS: Phosphorus: 2.8 mg/dL (ref 2.5–4.6)

## 2020-02-12 MED ORDER — FUROSEMIDE 40 MG PO TABS
60.0000 mg | ORAL_TABLET | Freq: Every day | ORAL | Status: DC
  Administered 2020-02-13 – 2020-02-14 (×2): 60 mg via ORAL
  Filled 2020-02-12 (×2): qty 1

## 2020-02-12 MED ORDER — SPIRONOLACTONE 25 MG PO TABS
150.0000 mg | ORAL_TABLET | Freq: Every day | ORAL | Status: DC
  Administered 2020-02-13 – 2020-02-14 (×2): 150 mg via ORAL
  Filled 2020-02-12 (×2): qty 1

## 2020-02-12 MED ORDER — ENSURE ENLIVE PO LIQD
237.0000 mL | Freq: Two times a day (BID) | ORAL | Status: DC
  Administered 2020-02-13 – 2020-02-16 (×5): 237 mL via ORAL

## 2020-02-12 MED ORDER — GADOBUTROL 1 MMOL/ML IV SOLN
7.0000 mL | Freq: Once | INTRAVENOUS | Status: AC | PRN
  Administered 2020-02-12: 7 mL via INTRAVENOUS

## 2020-02-12 MED ORDER — CYCLOBENZAPRINE HCL 5 MG PO TABS
5.0000 mg | ORAL_TABLET | Freq: Every day | ORAL | Status: DC
Start: 2020-02-12 — End: 2020-02-14
  Administered 2020-02-12 – 2020-02-13 (×2): 5 mg via ORAL
  Filled 2020-02-12 (×2): qty 1

## 2020-02-12 MED ORDER — MIDODRINE HCL 5 MG PO TABS
5.0000 mg | ORAL_TABLET | Freq: Three times a day (TID) | ORAL | Status: DC
  Administered 2020-02-12 – 2020-02-16 (×12): 5 mg via ORAL
  Filled 2020-02-12 (×12): qty 1

## 2020-02-12 MED ORDER — ADULT MULTIVITAMIN W/MINERALS CH
1.0000 | ORAL_TABLET | Freq: Every day | ORAL | Status: DC
  Administered 2020-02-12 – 2020-02-16 (×5): 1 via ORAL
  Filled 2020-02-12 (×5): qty 1

## 2020-02-12 MED ORDER — SPIRONOLACTONE 100 MG PO TABS
100.0000 mg | ORAL_TABLET | Freq: Every day | ORAL | Status: DC
  Administered 2020-02-12: 100 mg via ORAL
  Filled 2020-02-12: qty 1

## 2020-02-12 MED ORDER — FUROSEMIDE 40 MG PO TABS
40.0000 mg | ORAL_TABLET | Freq: Every day | ORAL | Status: DC
  Administered 2020-02-12: 40 mg via ORAL
  Filled 2020-02-12: qty 1

## 2020-02-12 NOTE — Plan of Care (Signed)

## 2020-02-12 NOTE — Progress Notes (Signed)
  Speech Language Pathology Treatment: Dysphagia  Patient Details Name: [name] MRN: [id number] DOB: Aug 03, 1940 Today's Date: 02/12/2020 Time: 1027-2536 SLP Time Calculation (min) (ACUTE ONLY): 26 min  Assessment / Plan / Recommendation Clinical Impression  Pt was seen this morning s/p thoracentesis on previous date, with pt feeling subjectively improved but also a little down because MD has already informed him about the re-accumulation noted on CXR. He has three thin liquid containers on his bedside table and is eating applesauce upon SLP arrival. SLP reinforced recommendations from previous date about recommendation for use of thickener, but did encourage pt to try thin liquids given his perceived improvement in respirations today. He still has delayed coughing after sips of water. Pt also clears his throat after bites of applesauce, but this could be consistent with strategies recommended from previous MBS to reduce vallecular residue. Recommend repeating MBS (most recently completed almost a year ago) for a more updated assessment of his oropharyngeal swallowing. Pt politely declines this test at this time, saying that he may want to do it once he is starting to feel better. He prefers to stay on current diet (Dys 2 solids, nectar thick liquids) with additional SLP f/u for possible MBS while inpatient. Thin liquids were therefore removed from his tray (signage in room x2 and orders already reflect need for thickener) and NT assisted in finding the can of thickener that was ordered after eval on previous date.    HPI HPI: 80yo male admitted 02/09/20 with SOB: pleural effusion after perceived aspiration event. PMH: cirrhosis, portal HTN, esophageal stricture/varicies, OSA on CPAP, aortic regurg, anemia, BPH, CAD, thrombocytopenia, parkinson's, R pleural effusion, AFib, peripheral neuropathy, HLD, GIB, colon cancer in remission      SLP Plan  Continue with current plan of care        Recommendations  Diet recommendations: Dysphagia 2 (fine chop);Nectar-thick liquid Liquids provided via: Cup Medication Administration: Whole meds with puree Supervision: Patient able to self feed;Staff to assist with self feeding;Intermittent supervision to cue for compensatory strategies Compensations: Minimize environmental distractions;Slow rate;Small sips/bites Postural Changes and/or Swallow Maneuvers: Seated upright 90 degrees;Upright 30-60 min after meal;Out of bed for meals                Oral Care Recommendations: Oral care BID Follow up Recommendations: Skilled Nursing facility SLP Visit Diagnosis: Dysphagia, unspecified (R13.10) Plan: Continue with current plan of care       GO                Mahala Menghini., M.A. CCC-SLP Acute Rehabilitation Services Pager [phone] Office [phone]  02/12/2020, 9:58 AM

## 2020-02-12 NOTE — Progress Notes (Signed)
PROGRESS NOTE    [name]  [id number] DOB: 12/21/1940 DOA: 02/09/2020 PCP: Burnard Bunting, MD  Chief Complaint  Patient presents with  . Chest Pain  . Shortness of Breath   Brief Narrative:  [name] is [name] 80 y.o. male with [name] pertinent history of  parkinsons GERD and history of esophageal stricture, cirrhosis of liver with portal hypertension and esophageal varices and thrombocytopenia, OSA on CPAP,, Nyiesha Beever. fib, aortic valve disorder, anemia, BPH, CAD, history of colon cancer status post chemotherapy which he thinks caused cirrhosis.  He states he has noticed Right lower chest pain, pleuriticin nature and he can't get Samy Ryner good breath.  He states they really noticed it yesterday after patient was eating [name] hamburger and had an aspiration event of hamburger they think.  He denies any fevers or chills or sick contacts but since then.  We thought this could have been coincidental as it takes something for someone to notice it.  Daughter helps corroborate the story.  In the emergency department 118/73, 98.1, HR 95, 95% on room air, RR 18.  WBC 8.9, Hgb 12.8, PLT 149, NA 130, K4.5, CL 93, CO2 26, SCR 1.40, glucose 150  CTa IMPRESSION: 1. No definite evidence of pulmonary embolus. 2. Large right pleural effusion is noted which results in right to left mediastinal shift and complete atelectasis of the right lower lobe. Partial atelectasis of the right upper and middle lobes is noted. Minimal left pleural effusion is noted with adjacent subsegmental atelectasis. No abx were given.  SLP previously showed mild aspiration risk.  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  He had thoracentesis 1/3, but had quick reaccumulation of fluid on repeat CXR on 1/4.  Plan for GI consult, sodium restriction, diuresis.  May need repeat thoracentesis.  May need discussion of TIPS with IR.      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  Transudative by lights.  Follow cytology. Continue lasix, spironolactone Lower suspicion for infection at this time, continue to monitor   Cirrhosis with hx ascites Continue lasix, spironolactone  propanolol US abdomen limited with small amount of ascites, large right effusion GI consult as noted above, appreciate recs  AKI  NAGMA: improving, follow with diuretics  Hyponatremia: follow, suspect 2/2 hypervolemia in setting of cirrhosis, pleural effusion, ascites  Parkinsons Continue sinemet Holding adderall for now  Hx colon cancer S/p colectomy and chemotherapy Follows with Dr. Benay Spice - in clinical remission   Anemia  Thrombocytopenia: continue to monitor   Prediabetes: follow a1c (6), follow outpatient   Muscle Spasms: trial flexeril at night  Body mass index is 18.31 kg/m.  DVT prophylaxis: heparin Code Status: full  Family Communication: none at bedside Disposition:   Status is: Inpatient  Remains inpatient appropriate because:Inpatient  level of care appropriate due to severity of illness   Dispo: The patient is from: Home              Anticipated d/c is to: pending              Anticipated d/c date is: > 3 days              Patient  currently is not medically stable to d/c.  Consultants:   pulm   Procedures:  none  Antimicrobials:  Anti-infectives (From admission, onward)   None         Subjective: Feels better than yesterday, but still some SOB  Objective: Vitals:   02/11/20 1743 02/11/20 1926 02/12/20 0616 02/12/20 1437  BP: 102/60 (!) 101/49 (!) 93/31 (!) 102/57  Pulse:  82 98 77  Resp:  20 20 17   Temp:  97.8 F (36.6 C) (!) 97.5 F (36.4 C) 98.3 F (36.8 C)  TempSrc:  Oral Oral Oral  SpO2:  96% 95% 97%  Weight:      Height:        Intake/Output Summary (Last 24 hours) at 02/12/2020 1715 Last data filed at 02/12/2020 1300 Gross per 24 hour  Intake 520 ml  Output 700 ml  Net -180 ml   Filed Weights   02/09/20 0937  Weight: 61.2 kg    Examination:  General: No acute distress. Cardiovascular: Heart sounds show Vaanya Shambaugh regular rate, and rhythm Lungs: diminished on R Abdomen: Soft, nontender, nondistended  Neurological: Alert and oriented 3. Moves all extremities 4. Cranial nerves II through XII grossly intact. Skin: Warm and dry. No rashes or lesions. Extremities: No clubbing or cyanosis. No edema     Data Reviewed: I have personally reviewed following labs and imaging studies  CBC: Recent Labs  Lab 02/09/20 0946 02/10/20 1014 02/11/20 0238 02/12/20 0149  WBC 8.9 5.4 5.5 4.9  NEUTROABS  --  4.3 4.4 3.8  HGB 12.8* 12.4* 12.5* 11.9*  HCT 37.5* 33.7* 33.6* 33.6*  MCV 89.5 86.9 85.7 86.4  PLT 149* 123* 161 122*    Basic Metabolic Panel: Recent Labs  Lab 02/09/20 0946 02/10/20 1014 02/11/20 0238 02/12/20 0149  NA 130* 130* 131* 129*  K 4.5 4.3 4.8 4.3  CL 93* 96* 94* 97*  CO2 26 19* 25 23  GLUCOSE 150* 140* 126* 122*  BUN 27* 28* 35* 34*  CREATININE 1.40* 1.14 1.37* 1.12  CALCIUM 9.2 9.0 9.0 8.4*  MG  --   --  1.9 1.9  PHOS  --   --  3.7 2.8    GFR: Estimated Creatinine Clearance: 47.1 mL/min (by C-G formula based on SCr of 1.12 mg/dL).  Liver Function  Tests: Recent Labs  Lab 02/09/20 2010 02/10/20 1014 02/11/20 0238 02/11/20 1058 02/12/20 0149  AST 20 20 29   --  15  ALT 16 21 13   --  10  ALKPHOS 89 83 84  --  87  BILITOT 3.3* 2.8* 2.1*  --  1.4*  PROT 6.9 6.4* 6.5  --  6.4*  ALBUMIN 2.8* 2.6* 2.6* 2.9* 2.5*    CBG: No results for input(s): GLUCAP in the last 168 hours.   Recent Results (from the past 240 hour(s))  Resp Panel by RT-PCR (Flu Mozell Haber&B, Covid) Nasopharyngeal Swab     Status: None   Collection Time: 02/09/20  5:28 PM   Specimen: Nasopharyngeal Swab; Nasopharyngeal(NP) swabs in vial transport medium  Result Value Ref Range Status   SARS  Coronavirus 2 by RT PCR NEGATIVE NEGATIVE Final    Comment: (NOTE) SARS-CoV-2 target nucleic acids are NOT DETECTED.  The SARS-CoV-2 RNA is generally detectable in upper respiratory specimens during the acute phase of infection. The lowest concentration of SARS-CoV-2 viral copies this assay can detect is 138 copies/mL. Doil Kamara negative result does not preclude SARS-Cov-2 infection and should not be used as the sole basis for treatment or other patient management decisions. Remedios Mckone negative result may occur with  improper specimen collection/handling, submission of specimen other than nasopharyngeal swab, presence of viral mutation(s) within the areas targeted by this assay, and inadequate number of viral copies(<138 copies/mL). Verdun Rackley negative result must be combined with clinical observations, patient history, and epidemiological information. The expected result is Negative.  Fact Sheet for Patients:  BloggerCourse.com  Fact Sheet for Healthcare Providers:  SeriousBroker.it  This test is no t yet approved or cleared by the [place] FDA and  has been authorized for detection and/or diagnosis of SARS-CoV-2 by FDA under an Emergency Use Authorization (EUA). This EUA will remain  in effect (meaning this test can be used) for the duration of  the COVID-19 declaration under Section 564(b)(1) of the Act, 21 U.S.C.section 360bbb-3(b)(1), unless the authorization is terminated  or revoked sooner.       Influenza Jaekwon Mcclune by PCR NEGATIVE NEGATIVE Final   Influenza B by PCR NEGATIVE NEGATIVE Final    Comment: (NOTE) The Xpert Xpress SARS-CoV-2/FLU/RSV plus assay is intended as an aid in the diagnosis of influenza from Nasopharyngeal swab specimens and should not be used as Javarion Douty sole basis for treatment. Nasal washings and aspirates are unacceptable for Xpert Xpress SARS-CoV-2/FLU/RSV testing.  Fact Sheet for Patients: BloggerCourse.com  Fact Sheet for Healthcare Providers: SeriousBroker.it  This test is not yet approved or cleared by the [place] FDA and has been authorized for detection and/or diagnosis of SARS-CoV-2 by FDA under an Emergency Use Authorization (EUA). This EUA will remain in effect (meaning this test can be used) for the duration of the COVID-19 declaration under Section 564(b)(1) of the Act, 21 U.S.C. section 360bbb-3(b)(1), unless the authorization is terminated or revoked.  Performed at [facility], [address] [place], [place] [address]   Pleural Fluid culture (includes gram stain)     Status: None (Preliminary result)   Collection Time: 02/11/20  2:18 PM   Specimen: Pleural Fluid  Result Value Ref Range Status   Specimen Description PLEURAL FLUID  Final   Special Requests NONE  Final   Gram Stain   Final    WBC PRESENT,BOTH PMN AND MONONUCLEAR NO ORGANISMS SEEN CYTOSPIN SMEAR    Culture   Final    NO GROWTH < 24 HOURS Performed at [facility], [address] [place], [place] [address]    Report Status PENDING  Incomplete         Radiology Studies: US Abdomen Limited  Result Date: 02/12/2020 CLINICAL DATA:  Liver fibrosis.  Rule out ascites. EXAM: LIMITED ABDOMEN ULTRASOUND FOR ASCITES TECHNIQUE: Limited ultrasound survey  for ascites was performed in all four abdominal quadrants. COMPARISON:  Ultrasound abdomen 12/25/2014 FINDINGS: Small amount ascites in the abdomen. Small ascites in the right upper quadrant and left lower quadrant. Large right pleural effusion. IMPRESSION: Small amount of ascites. Large right pleural effusion. Electronically Signed   By: Marlan Palau M.D.   On: 02/12/2020 14:39   DG CHEST PORT 1 VIEW  Result Date: 02/12/2020 CLINICAL DATA:  Pleural effusion. EXAM:  PORTABLE CHEST 1 VIEW COMPARISON:  Chest x-ray 02/11/2020. FINDINGS: Large right pleural effusion again noted. Interval progression from prior exam. Underlying atelectasis/infiltrate most likely present. Left lung is clear. Heart size difficult to evaluate due to large right pleural effusion. No pneumothorax. IMPRESSION: Large right pleural effusion again noted. Interval progression from prior exam. Underlying atelectasis/infiltrate most likely present. Electronically Signed   By: Marcello Moores  Register   On: 02/12/2020 06:19   DG CHEST PORT 1 VIEW  Result Date: 02/11/2020 CLINICAL DATA:  Status post thoracentesis. EXAM: PORTABLE CHEST 1 VIEW COMPARISON:  February 09, 2020. FINDINGS: Moderate right pleural effusion is unchanged, with probable underlying atelectasis or infiltrate. No pneumothorax is noted. Left lung is clear. Bony thorax is unremarkable. IMPRESSION: Stable moderate right pleural effusion with probable underlying atelectasis or infiltrate. No pneumothorax is noted. Electronically Signed   By: Marijo Conception M.D.   On: 02/11/2020 15:16        Scheduled Meds: . carbidopa-levodopa  1 tablet Oral BID AC  . cyclobenzaprine  5 mg Oral QHS  . enoxaparin (LOVENOX) injection  40 mg Subcutaneous QHS  . [START ON 02/13/2020] feeding supplement  237 mL Oral BID BM  . fentaNYL (SUBLIMAZE) injection  12.5 mcg Intravenous Once  . furosemide  40 mg Oral Daily  . LORazepam  0.5 mg Intravenous Once  . midodrine  5 mg Oral TID WC  .  multivitamin with minerals  1 tablet Oral Daily  . pantoprazole  40 mg Oral BID  . propranolol  20 mg Oral BID  . spironolactone  100 mg Oral Daily   Continuous Infusions:   LOS: 2 days    Time spent: over 30 min    Fayrene Helper, MD [facility]   To contact the attending provider between 7A-7P or the covering provider during after hours 7P-7A, please log into the web site [email or web address] and access using universal [place] password for that web site. If you do not have the password, please call the hospital operator.  02/12/2020, 5:15 PM

## 2020-02-12 NOTE — Consult Note (Addendum)
[facility] Consult: 9:55 AM 02/12/2020  LOS: 2 days    Referring Provider: Dr Rodman Pickle of Pulm/CCM  Primary Care Physician:  Burnard Bunting, MD Primary Gastroenterologist:  Previously Dr. Havery Moros not seen since 02/2016 when he switched his care to GI Dr Blenda Mounts at [facility] in [place].    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     HPI: [name] is a 80 y.o. male.  PMH Parkinsons. A. fib, not on anticoagulation.  OSA, on CPAP. Colon cancer, right colectomy 2013, chemotherapy.  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  Esophageal varices.  Iron deficiency anemia.  Multiple previous colonoscopies, EGDs and a capsule endoscopy.   EGDs x 2 02/2016:  1st w 7 bands placed to large esophageal varices, 1 with a red spot.  Cleared clotted and red blood from stomach revealing portal hypertensive gastritis without bleeding. 2nd w esophageal varices with stigmata of recent banding, size improved compared to previous.  4 bands placed resulting in deflation of varices.  Nonbleeding portal hypertensive gastritis. 11/2018 EGD: Only able to find pathology, not operative report.  A gastric polyp showing foveolar hyperplasia with focal mucosal ulceration and reactive epithelial changes.  Unremarkable pathology from small intestine biopsies. 07/2019 EGD.  Unable to retrieve report from [facility].  Pt says varices were banded.  No path from that date.  Dr Blenda Mounts planned repeat EGD 07/2020.    Colonoscopy 10/2015:  Multiple polyps resected, some of these at the anastomosis, resected/retrieved/biopsied.  Left colon tics.  Nonbleeding internal hemorrhoids.  Pathology showed tubular adenomas, ulcerated/inflamed hyperplastic polyps and  changes, reactive and hyperplastic epithelium.  No malignancy or HGD 11/27/2018 colonoscopy with polyps removed.  Unable to retrieve report from [place].  Pathology showed inflammatory polyp with mucosal ulceration from polyp at anastomosis.  12/07/2018 CTAP: Liver more shrunken and nodular compared with 2019.  Scattered hepatic cysts but no suspicious liver lesions.  Paraesophageal, gastrohepatic, splenorenal varices.  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  Large ascites, new compared with 2018.  Stable splenomegaly.  Cholelithiasis. 12/19/2018, 01/08/2019 diagnostic paracentesis.  No SBP. Cytology negative for malignancy. 01/2019 Abdominal ultrasound:  Insufficient fluid for paracentesis so procedure not performed.  25# weight loss in last year.  Also reports dysphagia where he has to eat very slowly in order to swallow liquids and solids.  Tends to eat a soft diet.  Has not had any aspiration events however. Presented to the ED on New Year's morning with mild shortness of breath, mild right lower chest pain.  He thought he may have aspirated after eating a hamburger 2 nights previously, but no clear history of choking event or vomiting.  Large right pleural effusion noted on imaging.  02/09/2020 CT angio chest: No PE.  Large R pleural effusion causing mediastinal shift, R sided atelectasis.  Minor left pleural effusion and ATX. 02/11/2020 CXR with stable moderate right pleural effusion. 02/12/2020 CXR w recurrent large right pleural effusion.  Fluid studies confirm transudate of fluid.  Total nucleated cells 816, 30% neutrophils.  Dr. Everardo All suspects hepatic hydrothorax Hgb 11.9.  WBCs normal.  Platelets 122K.  INR 1.4.  COVID-19 negative  Bedside swallow evaluation 1/3.  SLP noted week cough.  Recapped a hx of silent aspiration of thin liquids when evaluated in early 2020.  Pt somewhat compliant with HEP, RSMT device and swallowing exercises.   During yesterday's  bedside eval there were no overt S/S aspiration but pt brought up a lot of "thick mucus" after swallowing and deemed high risk for aspiration.  Diet downgraded to dysphagia 2 and nectar thickened liquid diet.    Social hx:   Lives at home and primary caregiver for his post CVA wife.  Still drives.  No alcohol and insignificant history of alcohol use.  Family history: A sister, a brother, nephew had colon cancer.  Sister had stomach cancer.  Aunts and siblings with CHF, DM, CAD, prostate cancer, lung cancer.       Past Medical History:  Diagnosis Date  . Anemia   . Aortic valve disorder 07/17/2018  . Arthritis    "thumbs" (11/29/2013)  . Atrial fibrillation [facility]) 2006   Dr Franki Monte af-no meds  . Bleeding esophageal varices (HCC)   . BPH (benign prostatic hypertrophy)   . CAD (coronary artery disease)    Mild by Cath 2004. Calcification with mild irregularity.  30% diagonal.  Cardiolite 8/13 normal EF and negative for ischemia (chronically positive ETT)   . CAP (community acquired pneumonia) 02/12/2013  . 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 Disorder of bilirubin excretion   . Diverticulosis    Sigmoid colon  . 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 Esophageal stricture   . Esophageal varices determined by endoscopy (HCC) sept 2016  . Fibrosis of liver   . Fracture of distal femur (HCC) 11/29/2013  . GERD (gastroesophageal reflux disease) 03-31-11   tx. Omeprazole  . 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 Hernia, abdominal    at the umbilicus  . History of colon cancer 02/25/2016  . History of esophageal varices   . History of hemorrhoids   . History of irregular heartbeat   . Hypercholesterolemia   . HYPERGLYCEMIA, FASTING 12/13/2007  . Hyperlipidemia   . Malignant neoplasm of ascending colon (HCC) 03/03/2011   s/p right colectomy 04/08/11 Dr. Purnell Shoemaker Genetic counseling recommendations:  SURVEILLANCE:   Because of the increased risk for cancer in you and your family which we cannot yet define through genetic testing, we urge you and family members to pursue screenings that are recommended for individuals at increased risk of Lynch syndrome (HNPCC)-associated cancers. The following measures are recommend  . Maxillary sinusitis, acute 05/06/2015  . 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 Nephrolithiasis   . Neuropathy 2013   as a result of chemo  . Obstructive sleep apnea 12/14/2007  . OSA on CPAP dx'd 2010   uses cap nightly  . Oxygen deficiency   . Parkinsonism (HCC) 10/24/2017  . Personal history of colonic polyps 09/17/2009   Tubular adenoma  . Pneumonia 01/2013  . 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 Portal hypertension with esophageal varices (HCC) 10/24/2017  . Resting tremor 10/24/2017  . Shortness of breath at rest   . Sleep apnea    uses c pap at home  . TBI (traumatic brain injury) (HCC) 1986   S/P fall; "  slow to get going q morning" (10//22/2015)  . Thrombocytopenia ([place]) 02/25/2016  . Titubation 10/24/2017  . Upper GI bleed 02/25/2016  . Viral URI with cough 01/24/2013    Past Surgical History:  Procedure Laterality Date  . APPENDECTOMY  03/2011  . CARDIAC CATHETERIZATION  07/2002   Dr Wynonia Lawman  . COLON SURGERY  2013  . COLONOSCOPY  sept 2016   upper endo and colonoscopy  . ESOPHAGEAL DILATION  2006  . ESOPHAGOGASTRODUODENOSCOPY (EGD) WITH PROPOFOL N/A 02/25/2016   Procedure: ESOPHAGOGASTRODUODENOSCOPY (EGD) WITH PROPOFOL;  Surgeon: Manus Gunning, MD;  Location: [facility];  Service: Gastroenterology;  Laterality: N/A;  . ESOPHAGOGASTRODUODENOSCOPY (EGD) WITH PROPOFOL N/A 03/10/2016   Procedure: ESOPHAGOGASTRODUODENOSCOPY (EGD) WITH PROPOFOL;  Surgeon: Manus Gunning, MD;  Location: [facility];  Service: Gastroenterology;  Laterality: N/A;  .  GASTRIC VARICES BANDING N/A 03/10/2016   Procedure: GASTRIC VARICES BANDING;  Surgeon: Manus Gunning, MD;  Location: [facility];  Service: Gastroenterology;  Laterality: N/A;  . INGUINAL HERNIA REPAIR Left ~ 1991  . LAPAROSCOPIC PARTIAL COLECTOMY  04/08/2011  . ORIF DISTAL FEMUR FRACTURE Right 11/29/2013   "put plate in"  . ORIF FEMUR FRACTURE Right 11/29/2013   Procedure: OPEN REDUCTION INTERNAL FIXATION (ORIF) RIGHT  DISTAL FEMUR FRACTURE;  Surgeon: Rozanna Box, MD;  Location: [facility];  Service: Orthopedics;  Laterality: Right;  . PORT-A-CATH REMOVAL  11/09/2011   Procedure: REMOVAL PORT-A-CATH;  Surgeon: Odis Hollingshead, MD;  Location: [facility];  Service: General;  Laterality: N/A;  . PORTACATH PLACEMENT  04/30/2011   Procedure: INSERTION PORT-A-CATH;  Surgeon: Odis Hollingshead, MD;  Location: [facility];  Service: General;  Laterality: Right;  . THORACENTESIS Right 02/11/2020   Procedure: THORACENTESIS;  Surgeon: Margaretha Seeds, MD;  Location: [facility];  Service: Pulmonary;  Laterality: Right;    Prior to Admission medications   Medication Sig Start Date End Date Taking? Authorizing Provider  acetaminophen (TYLENOL) 500 MG tablet Take 1,000 mg by mouth every 6 (six) hours as needed for mild pain or headache.   Yes [provider]  amphetamine-dextroamphetamine (ADDERALL) 30 MG tablet Take 30 mg by mouth daily as needed (for awake in the morning). 12/30/10  Yes [provider]  Artificial Tear Ointment (DRY EYES OP) Apply 1 drop to eye daily.   Yes [provider]  carbidopa-levodopa (SINEMET CR) 50-200 MG tablet TAKE 1 TABLET BY MOUTH TWICE DAILY 1 HOUR BEFORE BREAKFAST AND BEFORE DINNER Patient taking differently: Take 1 tablet by mouth 2 (two) times daily. 01/01/20  Yes Dohmeier, Asencion Partridge, MD  ferrous sulfate 325 (65 FE) MG tablet Take 325 mg by mouth 2 (two) times daily.   Yes [provider]  fluticasone (FLONASE) 50  MCG/ACT nasal spray Place 2 sprays into both nostrils at bedtime as needed for allergies or rhinitis.   Yes [provider]  furosemide (LASIX) 40 MG tablet Take 40 mg by mouth daily.    Yes [provider]  pantoprazole (PROTONIX) 40 MG tablet Take 1 tablet (40 mg total) by mouth 2 (two) times daily. 02/29/16  Yes Donne Hazel, MD  propranolol (INDERAL) 20 MG tablet Take 1 tablet (20 mg total) by mouth 2 (two) times daily. 02/29/16  Yes Donne Hazel, MD  spironolactone (ALDACTONE) 100 MG tablet Take 100 mg by mouth daily.  07/18/18  Yes [provider]    Scheduled Meds: . carbidopa-levodopa  1 tablet Oral BID AC  . enoxaparin (LOVENOX)  injection  40 mg Subcutaneous QHS  . fentaNYL (SUBLIMAZE) injection  12.5 mcg Intravenous Once  . furosemide  40 mg Oral Daily  . LORazepam  0.5 mg Intravenous Once  . pantoprazole  40 mg Oral BID  . propranolol  20 mg Oral BID  . spironolactone  100 mg Oral Daily   Infusions:  PRN Meds: acetaminophen, morphine injection, Muscle Rub, naphazoline-glycerin, polyethylene glycol, Resource ThickenUp Clear   Allergies as of 02/09/2020  . (No Known Allergies)    Family History  Problem Relation Age of Onset  . Diabetes Father   . Prostate cancer Father   . Heart attack Father 64  . Heart failure Mother        Congestive heart failure  . Cancer Brother        colon  . Colon cancer Brother 30  . Stomach cancer Sister   . Colon cancer Other   . Colon cancer Sister   . Coronary artery disease Brother        CABG  . Lung cancer Brother   . Anesthesia problems Neg Hx   . Esophageal cancer Neg Hx     Social History   Socioeconomic History  . Marital status: Married    Spouse name: Not on file  . Number of children: 2  . Years of education: Not on file  . Highest education level: Not on file  Occupational History  . Occupation: Retired  Tobacco Use  . Smoking status: Former Smoker    Packs/day: 0.50    Years:  7.00    Pack years: 3.50    Types: Cigarettes    Quit date: 03/21/1961    Years since quitting: 58.9  . Smokeless tobacco: Never Used  Vaping Use  . Vaping Use: Never used  Substance and Sexual Activity  . Alcohol use: No    Alcohol/week: 0.0 standard drinks  . Drug use: No  . Sexual activity: Yes  Other Topics Concern  . Not on file  Social History Narrative  . Not on file   Social Determinants of Health   Financial Resource Strain: Not on file  Food Insecurity: Not on file  Transportation Needs: Not on file  Physical Activity: Not on file  Stress: Not on file  Social Connections: Not on file  Intimate Partner Violence: Not on file    REVIEW OF SYSTEMS: Constitutional: At home he does not require a walker for ambulation.  Recently feeling tired but not weak. ENT:  No nose bleeds Pulm: Shortness of breath, somewhat improved following paracentesis.  Cough is mostly nonproductive but sometimes brings up thick mucus CV:  No palpitations, no LE edema.  No angina GU:  No hematuria, no frequency GI: See HPI.  No difficulty chewing Heme: No unusual bleeding or bruising Transfusions: None Neuro:  + Tremors.  No headaches, no peripheral tingling or numbness.  No syncope, no seizures. Derm:  No itching, no rash or sores.  Endocrine:  No sweats or chills.  No polyuria or dysuria Immunization: Multiple vaccines reviewed but do not see records pertaining to COVID-19 vaccination Travel:  None beyond local counties in last few months.    PHYSICAL EXAM: Vital signs in last 24 hours: Vitals:   02/11/20 1926 02/12/20 0616  BP: (!) 101/49 (!) 93/31  Pulse: 82 98  Resp: 20 20  Temp: 97.8 F (36.6 C) (!) 97.5 F (36.4 C)  SpO2: 96% 95%   Wt Readings from Last 3 Encounters:  02/09/20 61.2 kg  01/07/20 65.4 kg  10/10/19 66.2 kg    General: Cachectic, unwell appearing, frail.  Looks older than stated age. Head: No facial asymmetry or swelling.  No signs of head trauma Eyes:  Eyes somewhat bloodshot.  No scleral icterus.  No conjunctival pallor. Ears: Not hard of hearing Nose: No congestion or discharge Mouth: Mucosa moist, pink, clear.  Tongue midline.  Fair dentition but several missing teeth. Neck: No JVD, no masses, no thyromegaly Lungs: Greatly diminished bilaterally at least halfway up.  No adventitious sounds.  Frequent mucoid cough but not clearing much.  Vocal quality weak and wet Heart: RRR.  No MRG.  S1, S2 present Abdomen: Soft, nondistended.  No HSM, masses, bruits.  There is a larger left inguinal hernia, small right inguinal hernia and medium umbilical hernia.  Bowel sounds active.   Rectal: Deferred Musc/Skeltl: No joint redness, swelling, gross deformity.  Arthritic evident in the hands, fingers.  Osteopenic Extremities: No CCE. Neurologic: Oriented x3.  Slow, deliberate speech hard to understand at times.  Not confused/fully oriented.  Moves all 4 limbs.  No tremors, strength not tested. Skin: No rash, no sores, no telangiectasia or significant bruising Tattoos: None observed Nodes: No cervical adenopathy Psych: Flat affect, pleasant, cooperative  Intake/Output from previous day: 01/03 0701 - 01/04 0700 In: 540 [P.O.:540] Out: 2050 [Urine:650] Intake/Output this shift: No intake/output data recorded.  LAB RESULTS: Recent Labs    02/10/20 1014 02/11/20 0238 02/12/20 0149  WBC 5.4 5.5 4.9  HGB 12.4* 12.5* 11.9*  HCT 33.7* 33.6* 33.6*  PLT 123* 161 122*   BMET Lab Results  Component Value Date   NA 129 (L) 02/12/2020   NA 131 (L) 02/11/2020   NA 130 (L) 02/10/2020   K 4.3 02/12/2020   K 4.8 02/11/2020   K 4.3 02/10/2020   CL 97 (L) 02/12/2020   CL 94 (L) 02/11/2020   CL 96 (L) 02/10/2020   CO2 23 02/12/2020   CO2 25 02/11/2020   CO2 19 (L) 02/10/2020   GLUCOSE 122 (H) 02/12/2020   GLUCOSE 126 (H) 02/11/2020   GLUCOSE 140 (H) 02/10/2020   BUN 34 (H) 02/12/2020   BUN 35 (H) 02/11/2020   BUN 28 (H) 02/10/2020    CREATININE 1.12 02/12/2020   CREATININE 1.37 (H) 02/11/2020   CREATININE 1.14 02/10/2020   CALCIUM 8.4 (L) 02/12/2020   CALCIUM 9.0 02/11/2020   CALCIUM 9.0 02/10/2020   LFT Recent Labs    02/09/20 2010 02/10/20 1014 02/11/20 0238 02/11/20 1058 02/12/20 0149  PROT 6.9 6.4* 6.5  --  6.4*  ALBUMIN 2.8* 2.6* 2.6* 2.9* 2.5*  AST 20 20 29   --  15  ALT 16 21 13   --  10  ALKPHOS 89 83 84  --  87  BILITOT 3.3* 2.8* 2.1*  --  1.4*  BILIDIR 0.9*  --   --   --   --   IBILI 2.4*  --   --   --   --    PT/INR Lab Results  Component Value Date   INR 1.4 (H) 02/09/2020   INR 1.3 (H) 03/09/2016   INR 1.47 02/25/2016   Hepatitis Panel No results for input(s): HEPBSAG, HCVAB, HEPAIGM, HEPBIGM in the last 72 hours. C-Diff No components found for: CDIFF Lipase  No results found for: LIPASE  Drugs of Abuse  No results found for: LABOPIA, COCAINSCRNUR, LABBENZ, AMPHETMU, THCU, LABBARB   RADIOLOGY STUDIES: DG CHEST PORT 1 VIEW  Result Date: 02/12/2020 CLINICAL  DATA:  Pleural effusion. EXAM: PORTABLE CHEST 1 VIEW COMPARISON:  Chest x-ray 02/11/2020. FINDINGS: Large right pleural effusion again noted. Interval progression from prior exam. Underlying atelectasis/infiltrate most likely present. Left lung is clear. Heart size difficult to evaluate due to large right pleural effusion. No pneumothorax. IMPRESSION: Large right pleural effusion again noted. Interval progression from prior exam. Underlying atelectasis/infiltrate most likely present. Electronically Signed   By: Marcello Moores  Register   On: 02/12/2020 06:19   DG CHEST PORT 1 VIEW  Result Date: 02/11/2020 CLINICAL DATA:  Status post thoracentesis. EXAM: PORTABLE CHEST 1 VIEW COMPARISON:  February 09, 2020. FINDINGS: Moderate right pleural effusion is unchanged, with probable underlying atelectasis or infiltrate. No pneumothorax is noted. Left lung is clear. Bony thorax is unremarkable. IMPRESSION: Stable moderate right pleural effusion with  probable underlying atelectasis or infiltrate. No pneumothorax is noted. Electronically Signed   By: Marijo Conception M.D.   On: 02/11/2020 15:16     IMPRESSION:   *   Right pleural effusion, rapidly reaccumulated following yesterday's paracentesis of transudative fluid.  ? Hepatic hydrtothorax.    *   Paracentesis x2 in 12/2018.  At third planned paracentesis there was insufficient ascites to tap and patient has had no subsequent paracentesis. Home diuretics: Furosemide 40 mg/day, spironolactone 100 mg/day.    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 Latest abdominal/liver imaging was CTAP in 11/2018 revealing progressive cirrhosis, liver cysts, paraesophageal, gastrohepatic, stable splenorenal varices and chronic nonocclusive thrombus of main PV and splenic vein.    *   Esophageal varices with bleeding. Portal hypertensive gastropathy.   Previous EGDs w EVL, latest was 07/2019.  On Inderal 20 mg bid.    *    Colon cancer with right colectomy 2013, s/p chemotherapy.  ? HNPCC.  Latest surveillance study 11/2018 w inflammatory polyp at anastomosis.    *    Bilateral inguinal and umbilical hernias.  *    Weight loss, 25 pounds within the last 12 months.  Hypoalbuminemia C/W protein malnutrition.  *   Hyponatremia at 129.  *    Coagulopathy, mild.  INR 1.4.  *    Dysphagia.  Likely neurogenic suspected due to Parkinson's.  *    Anemia, history of IDA.  On bid po iron.  Hb 11.9  *   Thrombocytopenia.  Known splenomegaly.    PLAN:     *  Abdominal ultrasound, assess for ascites.   *   Additional thoracentesis?  *   Acute on hyponatremia and recent AKI which is improving, leave diuretics at current dose.   Azucena Freed  02/12/2020, 9:55 AM Phone [phone]

## 2020-02-12 NOTE — Consult Note (Addendum)
NAME:  [name], MRN:  [id number], DOB:  [birth year], LOS: 2 ADMISSION DATE:  02/09/2020, CONSULTATION DATE:  02/10/20 REFERRING MD:  Cherlynn June - [facility], CHIEF COMPLAINT: SOB:  Pleural Effusion  Brief History:  80 yo M with cirrhosis among other things, admitted w SOB ? Aspiration. Has big  R sided pleural effusion   History of Present Illness:  80 yo M PMH cirrhosis, portal hypertension, esophageal varices, thrombocytopenia, parkinsons, Afib (not on anticoag) who presented to ED 1/1 with SOB, R sided chest pain after perceived aspiration event. Chest pain pleuritic in nature and limited to R side, lower chest. No fevers chills, cough, sick contacts.  In ED, CTA chest revealed large R sided pleural effusion.   PCCM consulted for evaluation of pleural effusion.   Past Medical History:  Cirrhosis Prtal Hypertension Esophageal stricture Esophageal varices Thrombocytopenia OSA on CPAP Afib Aortic regurgitation Anemia BPH CAD Hx Colon cancer Parkinsons Peripheral neuropathy Diverticulosis HLD GIB Significant Hospital Events:  1/1 admitted to [facility] for SOB. Found to have large R pleural effusion   Consults:  PCCM  Procedures:    Significant Diagnostic Tests:  02/09/20 CTA chest> no PE. Large R sided pleural effusion with R to L mediastinal shift. R sided atelectasis. Small L pleural effusion.  02/12/19 Increased right pleural effusion  Micro Data:  1/1 SARS Cov 2> neg 1/1 Flu A/B> neg   Antimicrobials:    Interim History / Subjective:  Reports dyspnea improved post-thoracentesis and not currently having any issues with breathing. Denies chest pain.  Objective   Blood pressure (!) 93/31, pulse 98, temperature (!) 97.5 F (36.4 C), temperature source Oral, resp. rate 20, height 6' (1.829 m), weight 61.2 kg, SpO2 95 %.        Intake/Output Summary (Last 24 hours) at 02/12/2020 0834 Last data filed at 02/11/2020 2306 Gross per 24 hour  Intake 540 ml  Output 2050 ml  Net -1510  ml   Filed Weights   02/09/20 0937  Weight: 61.2 kg   Physical Exam: General: Elderly-appearing, cachectic, no acute distress HENT: [place], AT, OP clear, MMM Eyes: EOMI, no scleral icterus Respiratory: Diminished right breath sounds Cardiovascular: RRR, -M/R/G, no JVD GI: BS+, soft, nontender, no ascites Extremities:-Edema,-tenderness Neuro: AAO x4, dysarthric speech, parkinson's tremor Psych: Normal mood, normal affect  Resolved Hospital Problem list     Assessment & Plan:   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 No significant ascites at this time with prior history requiring paracentesis in 2019. S/p thoracentesis 1/3 with transudative effusion, culture NGTD. CXR with reaccumulation of fluid. Pleural studies with pleural albumin:serum albumin ratio >1.1  Thoracentesis can be performed as needed for symptom relief. Would not recommend chest tube as no evidence of infection at this time and if this is hepatic hydrothorax, this could result in complications including continuous fluid reaccumulation and electrolyte shifts.   Given his rapid reaccumulation and concern for hepatic hydrothorax, he may need TIPS or pleurodesis for definitive management if he fails diuretics. Optimizing his diuretics may be potentially be a challenge with his normal-low blood pressures.  Plan -Consult GI -Follow-up final cultures -Continue home lasix 40 mg spironolactone 100mg  at home -Sodium restriction   -CXR PRN for worsening shortness of breath -Thoracentesis as needed for symptom management  Cirrhosis of liver Portal hypertension Esophageal varices Thrombocytopenia, mild  Hyperbilirubinemia  Coagulopathy, mild   -Previously followed by [facility]. Has seen [facility] hepatology. No family hx of liver  disease. No documented alcohol history -Consider Palliative consult  Pulmonary available as needed. Please call consult team if patient needs  repeat thoracentesis  Best practice (evaluated daily)  Diet: sodium restriction Pain/Anxiety/Delirium protocol (if indicated): na VAP protocol (if indicated): na DVT prophylaxis: Sq Heparin GI prophylaxis: protonix Glucose control: monitor Mobility: fall precautions Disposition:med surg   Goals of Care:  Last date of multidisciplinary goals of care discussion:-- Family and staff present: -- Summary of discussion: --  Follow up goals of care discussion due: -- Code Status: Full   Labs   CBC: Recent Labs  Lab 02/09/20 0946 02/10/20 1014 02/11/20 0238 02/12/20 0149  WBC 8.9 5.4 5.5 4.9  NEUTROABS  --  4.3 4.4 3.8  HGB 12.8* 12.4* 12.5* 11.9*  HCT 37.5* 33.7* 33.6* 33.6*  MCV 89.5 86.9 85.7 86.4  PLT 149* 123* 161 122*    Basic Metabolic Panel: Recent Labs  Lab 02/09/20 0946 02/10/20 1014 02/11/20 0238 02/12/20 0149  NA 130* 130* 131* 129*  K 4.5 4.3 4.8 4.3  CL 93* 96* 94* 97*  CO2 26 19* 25 23  GLUCOSE 150* 140* 126* 122*  BUN 27* 28* 35* 34*  CREATININE 1.40* 1.14 1.37* 1.12  CALCIUM 9.2 9.0 9.0 8.4*  MG  --   --  1.9 1.9  PHOS  --   --  3.7 2.8   GFR: Estimated Creatinine Clearance: 47.1 mL/min (by C-G formula based on SCr of 1.12 mg/dL). Recent Labs  Lab 02/09/20 0946 02/10/20 1014 02/11/20 0238 02/12/20 0149  WBC 8.9 5.4 5.5 4.9    Liver Function Tests: Recent Labs  Lab 02/09/20 2010 02/10/20 1014 02/11/20 0238 02/11/20 1058 02/12/20 0149  AST 20 20 29   --  15  ALT 16 21 13   --  10  ALKPHOS 89 83 84  --  87  BILITOT 3.3* 2.8* 2.1*  --  1.4*  PROT 6.9 6.4* 6.5  --  6.4*  ALBUMIN 2.8* 2.6* 2.6* 2.9* 2.5*   No results for input(s): LIPASE, AMYLASE in the last 168 hours. No results for input(s): AMMONIA in the last 168 hours.  ABG    Component Value Date/Time   TCO2 22 11/23/2013 1540     Coagulation Profile: Recent Labs  Lab 02/09/20 2010  INR 1.4*    Cardiac Enzymes: No results for input(s): CKTOTAL, CKMB, CKMBINDEX,  TROPONINI in the last 168 hours.  HbA1C: Hgb A1c MFr Bld  Date/Time Value Ref Range Status  02/10/2020 10:14 AM 6.0 (H) 4.8 - 5.6 % Final    Comment:    (NOTE) Pre diabetes:          5.7%-6.4%  Diabetes:              >6.4%  Glycemic control for   <7.0% adults with diabetes   12/16/2008 11:20 AM 5.0 4.6 - 6.5 % Final    Comment:    See lab report for associated comment(s)    CBG: No results for input(s): GLUCAP in the last 168 hours.  I have spent a total time 35-minutes on the day of the appointment reviewing prior documentation, coordinating care and discussing medical diagnosis and plan with the patient/family. Imaging, labs and tests included in this note have been reviewed and interpreted independently by me.  Rodman Pickle, M.D. [facility] Pulmonary/Critical Care Medicine 02/12/2020 8:34 AM

## 2020-02-12 NOTE — Progress Notes (Signed)
Initial Nutrition Assessment  DOCUMENTATION CODES:   Underweight,Severe malnutrition in context of chronic illness  INTERVENTION:   -Ensure Enlive po BID, each supplement provides 350 kcal and 20 grams of protein -MVI with minerals daily -Magic cup TID with meals, each supplement provides 290 kcal and 9 grams of protein  NUTRITION DIAGNOSIS:   Severe Malnutrition related to chronic illness (cirrhosis) as evidenced by severe fat depletion,severe muscle depletion,percent weight loss.  GOAL:   Patient will meet greater than or equal to 90% of their needs  MONITOR:   Diet advancement,PO intake,Supplement acceptance,Labs,Skin,I & O's,Weight trends  REASON FOR ASSESSMENT:   Consult Assessment of nutrition requirement/status  ASSESSMENT:   [name] is a 80 y.o. male with a pertinent history of   parkinsons GERD and history of esophageal stricture, cirrhosis of liver with portal hypertension and esophageal varices and thrombocytopenia, OSA on CPAP,, A. fib, aortic valve disorder, anemia, BPH, CAD, history of colon cancer status post chemotherapy which he thinks caused cirrhosis.  Pt admitted with rt pleural effusion.   1/3- s/p BSE- downgraded diet dysphagia 2 diet with nectar thick liquids; s/p thoracentesis  Reviewed I/O's: -1.5 L x 24 hours and -2.9 L since admission  UOP: 1.4 L x 24 hours  Spoke with pt at bedside, who reports a general decline in health over the past year, which included poor appetite and weight loss. Pt shares that he has to eat very slowly and it often takes him about 2 hours to complete a meal. Noted meal completion 25-100%. Observed breakfast tray- pt consumed about 75% of tray. Pt has been trying to focus on protein intake and shares that he consumes a homemade protein shake PTA with protein powder (containing 22 grams of protein) with a banana and peanut butter blended it. His main complaint is that the thickened liquids coat his mouth and do not  relieve mucous in the back of his throat, which makes it more difficult for him to breathe and eat.   Pt reports his UBW is around 180# and reports losing about 45# over the past year. Reviewed wt hx; pt has experienced a 6.4% wt loss over the past month, which is significant for time frame.   Discussed with pt importance of good meal and supplement intake to promote healing. He reports that he does not like the thickened liquids, but will consume them as they are recommended. Pt considering MBSS study at a later date; encouraged him to continue to work with SLP. He is concerned about his hydration status. RD discussed ways to increase calories and protein in diet; he is amenable to [employer] and thickened Ensure Enlive. RD actively listened to pt concerns and provided emotional support.   Medications reviewed and include ativan.   Albumin has a half-life of 21 days and is strongly affected by stress response and inflammatory process, therefore, do not expect to see an improvement in this lab value during acute hospitalization. When a patient presents with low albumin, it is likely skewed due to the acute inflammatory response. Note that low albumin is no longer used to diagnose malnutrition; [place] uses the new malnutrition guidelines published by the American Society for Parenteral and Enteral Nutrition (A.S.P.E.N.) and the Academy of Nutrition and Dietetics (AND).    Labs reviewed: Na: 129.   NUTRITION - FOCUSED PHYSICAL EXAM:  Flowsheet Row Most Recent Value  Orbital Region Severe depletion  Upper Arm Region Severe depletion  Thoracic and Lumbar Region Severe depletion  Buccal Region Severe depletion  Temple Region Severe depletion  Clavicle Bone Region Severe depletion  Clavicle and Acromion Bone Region Severe depletion  Scapular Bone Region Severe depletion  Dorsal Hand Severe depletion  Patellar Region Severe depletion  Anterior Thigh Region Severe depletion  Posterior Calf  Region Severe depletion  Edema (RD Assessment) None  Hair Reviewed  Eyes Reviewed  Mouth Reviewed  Skin Reviewed  Nails Reviewed      Diet Order:   Diet Order            DIET DYS 2 Room service appropriate? Yes with Assist; Fluid consistency: Nectar Thick  Diet effective now                 EDUCATION NEEDS:   Education needs have been addressed  Skin:  Skin Assessment: Skin Integrity Issues: Skin Integrity Issues:: Stage I Stage I: bilateral buttocks  Last BM:  02/08/20  Height:   Ht Readings from Last 1 Encounters:  02/09/20 6' (1.829 m)    Weight:   Wt Readings from Last 1 Encounters:  02/09/20 61.2 kg    Ideal Body Weight:  80.9 kg  BMI:  Body mass index is 18.31 kg/m.  Estimated Nutritional Needs:   Kcal:  6808-8110  Protein:  125-150 grams  Fluid:  > 2 L    Levada Schilling, RD, LDN, CDCES Registered Dietitian II Certified Diabetes Care and Education Specialist Please refer to [facility] for RD and/or RD on-call/weekend/after hours pager

## 2020-02-12 NOTE — Progress Notes (Signed)
Physical Therapy Treatment Patient Details Name: [name] MRN: [id number] DOB: 10-20-1940 Today's Date: 02/12/2020    History of Present Illness 80 y.o. male admitted with chest tightness and difficulty breathing.  PMH pertinent for parkinsons GERD and history of esophageal stricture, cirrhosis of liver with portal hypertension and esophageal varices and thrombocytopenia, OSA on CPAP,, A. fib, aortic valve disorder, anemia, BPH, CAD, history of colon cancer status post chemotherapy which he thinks caused cirrhosis. Workup reveals pleural effusion.    PT Comments    Pt up in chair on arrival, son present in room and encouraging, pt agreeable to therapy session and with good participation.  Pt min guard for mobility tasks using RW, able to progress gait distance to 110ft. Unable to obtain good pleth reading (fingers cold), RN notified, however no notable dyspnea on exertion. Pt given BUE therex for strengthening and encouraged use of IS hourly, pt pulling ~750 on IS this date. Pt continues to benefit from PT services to progress toward functional mobility goals. D/C recs below remain appropriate.  Follow Up Recommendations  No PT follow up     Equipment Recommendations  Other (comment) (may benefir from RW, will continue to assess)    Recommendations for Other Services       Precautions / Restrictions Precautions Precautions: None Restrictions Weight Bearing Restrictions: No    Mobility  Bed Mobility               General bed mobility comments: pt up in chair pre/post  Transfers Overall transfer level: Needs assistance Equipment used: Rolling walker (2 wheeled) Transfers: Sit to/from Stand Sit to Stand: Min guard         General transfer comment: min guard for safety and management of lines/leads  Ambulation/Gait Ambulation/Gait assistance: Min guard Gait Distance (Feet): 100 Feet Assistive device: Rolling walker (2 wheeled) Gait Pattern/deviations:  Step-through pattern Gait velocity: decreased   General Gait Details: pt using RW due to mild unsteadiness, no significant dypsnea on exertion, tried to get SpO2 however poor pleth reading on fingers, RN notified (no earlobe sensors stocked in par stock room either)   Stairs             Wheelchair Mobility    Modified Rankin (Stroke Patients Only)       Balance Overall balance assessment: Needs assistance Sitting-balance support: Feet supported;No upper extremity supported Sitting balance-Leahy Scale: Good     Standing balance support: No upper extremity supported Standing balance-Leahy Scale: Fair Standing balance comment: fair static standing balance unsupported, but with dynamic standing tasks pt used RW, 1 mild LOB when turning but needing only min guard to minA to correct                            Cognition Arousal/Alertness: Awake/alert Behavior During Therapy: WFL for tasks assessed/performed Overall Cognitive Status: Within Functional Limits for tasks assessed                                 General Comments: HoH (hearing aide in L ear)      Exercises General Exercises - Upper Extremity Shoulder Flexion: AROM;Strengthening;Both;10 reps;Seated Elbow Flexion: AROM;Strengthening;Both;10 reps;Seated    General Comments General comments (skin integrity, edema, etc.): bandaid over site of previous thoracentesis on back with no new drainage observed; kyphotic posture      Pertinent Vitals/Pain Pain Assessment: Faces Faces Pain  Scale: No hurt Pain Intervention(s): Monitored during session;Repositioned  No c/o dizziness with mobility and not further assessed.  Home Living                      Prior Function            PT Goals (current goals can now be found in the care plan section) Acute Rehab PT Goals Patient Stated Goal: breathe better PT Goal Formulation: With patient Time For Goal Achievement: 02/16/20 Potential  to Achieve Goals: Good Progress towards PT goals: Progressing toward goals    Frequency    Min 3X/week      PT Plan Current plan remains appropriate    Co-evaluation              AM-PAC PT "6 Clicks" Mobility   Outcome Measure  Help needed turning from your back to your side while in a flat bed without using bedrails?: None Help needed moving from lying on your back to sitting on the side of a flat bed without using bedrails?: A Little Help needed moving to and from a bed to a chair (including a wheelchair)?: A Little Help needed standing up from a chair using your arms (e.g., wheelchair or bedside chair)?: A Little Help needed to walk in hospital room?: A Little Help needed climbing 3-5 steps with a railing? : A Little 6 Click Score: 19    End of Session Equipment Utilized During Treatment: Oxygen;Gait belt Activity Tolerance: Patient tolerated treatment well Patient left: in chair;with call bell/phone within reach;with family/visitor present Nurse Communication: Mobility status PT Visit Diagnosis: Unsteadiness on feet (R26.81);Other abnormalities of gait and mobility (R26.89)     Time: [id number] PT Time Calculation (min) (ACUTE ONLY): 22 min  Charges:  $Gait Training: 8-22 mins                     [name] P., PTA Acute Rehabilitation Services Pager: [phone] Office: [place] 02/12/2020, 1:34 PM

## 2020-02-13 ENCOUNTER — Inpatient Hospital Stay (HOSPITAL_COMMUNITY): Payer: Medicare Other

## 2020-02-13 DIAGNOSIS — E43 Unspecified severe protein-calorie malnutrition: Secondary | ICD-10-CM | POA: Diagnosis not present

## 2020-02-13 DIAGNOSIS — J948 Other specified pleural conditions: Secondary | ICD-10-CM | POA: Diagnosis not present

## 2020-02-13 DIAGNOSIS — I313 Pericardial effusion (noninflammatory): Secondary | ICD-10-CM

## 2020-02-13 DIAGNOSIS — R932 Abnormal findings on diagnostic imaging of liver and biliary tract: Secondary | ICD-10-CM

## 2020-02-13 DIAGNOSIS — K7460 Unspecified cirrhosis of liver: Secondary | ICD-10-CM | POA: Diagnosis not present

## 2020-02-13 DIAGNOSIS — J90 Pleural effusion, not elsewhere classified: Secondary | ICD-10-CM | POA: Diagnosis not present

## 2020-02-13 DIAGNOSIS — K766 Portal hypertension: Secondary | ICD-10-CM | POA: Diagnosis not present

## 2020-02-13 LAB — COMPREHENSIVE METABOLIC PANEL
ALT: 5 U/L (ref 0–44)
AST: 22 U/L (ref 15–41)
Albumin: 2.9 g/dL — ABNORMAL LOW (ref 3.5–5.0)
Alkaline Phosphatase: 95 U/L (ref 38–126)
Anion gap: 10 (ref 5–15)
BUN: 30 mg/dL — ABNORMAL HIGH (ref 8–23)
CO2: 25 mmol/L (ref 22–32)
Calcium: 8.9 mg/dL (ref 8.9–10.3)
Chloride: 94 mmol/L — ABNORMAL LOW (ref 98–111)
Creatinine, Ser: 1.26 mg/dL — ABNORMAL HIGH (ref 0.61–1.24)
GFR, Estimated: 58 mL/min — ABNORMAL LOW (ref >60)
Glucose, Bld: 183 mg/dL — ABNORMAL HIGH (ref 70–99)
Potassium: 4.6 mmol/L (ref 3.5–5.1)
Sodium: 129 mmol/L — ABNORMAL LOW (ref 135–145)
Total Bilirubin: 1.6 mg/dL — ABNORMAL HIGH (ref 0.3–1.2)
Total Protein: 7.4 g/dL (ref 6.5–8.1)

## 2020-02-13 LAB — CBC
HCT: 40.4 % (ref 39.0–52.0)
Hemoglobin: 14.1 g/dL (ref 13.0–17.0)
MCH: 30.4 pg (ref 26.0–34.0)
MCHC: 34.9 g/dL (ref 30.0–36.0)
MCV: 87.1 fL (ref 80.0–100.0)
Platelets: 153 10*3/uL (ref 150–400)
RBC: 4.64 MIL/uL (ref 4.22–5.81)
RDW: 13.3 % (ref 11.5–15.5)
WBC: 8 10*3/uL (ref 4.0–10.5)
nRBC: 0 % (ref 0.0–0.2)

## 2020-02-13 LAB — PROTIME-INR
INR: 1.3 — ABNORMAL HIGH (ref 0.8–1.2)
Prothrombin Time: 15.5 seconds — ABNORMAL HIGH (ref 11.4–15.2)

## 2020-02-13 NOTE — Progress Notes (Signed)
Daily Rounding Note  02/13/2020, 10:00 AM  LOS: 3 days   SUBJECTIVE:   Chief complaint: hepatic hydrothorax.    No new complaints Pt states he had MRI abd and was taken back for more images Without oxygen and at rest he doesn't feel differently with or without oxygen Has not yet had ECHO per patient Pastor at bedside  OBJECTIVE:         Vital signs in last 24 hours:    Temp:  [97.4 F (36.3 C)-98.3 F (36.8 C)] 97.4 F (36.3 C) (01/05 0616) Pulse Rate:  [77-94] 94 (01/05 0616) Resp:  [17-18] 18 (01/05 0616) BP: (92-111)/(57-69) 92/69 (01/05 0616) SpO2:  [94 %-97 %] 94 % (01/05 0616) Last BM Date: 02/12/20 Filed Weights   02/09/20 0937  Weight: 61.2 kg   General: awake, alert, NAD Heart: reg, 2/6 sem Chest: nearly absent BS right, decrease left base and course Abdomen: soft, nt, nd Extremities: no edema Neuro/Psych:  Oriented x 4, no asterixis  Intake/Output from previous day: 01/04 0701 - 01/05 0700 In: 300 [P.O.:300] Out: 500 [Urine:500]  Intake/Output this shift: No intake/output data recorded.  Lab Results: Recent Labs    02/11/20 0238 02/12/20 0149 02/13/20 0059  WBC 5.5 4.9 8.0  HGB 12.5* 11.9* 14.1  HCT 33.6* 33.6* 40.4  PLT 161 122* 153   BMET Recent Labs    02/11/20 0238 02/12/20 0149 02/13/20 0059  NA 131* 129* 129*  K 4.8 4.3 4.6  CL 94* 97* 94*  CO2 25 23 25   GLUCOSE 126* 122* 183*  BUN 35* 34* 30*  CREATININE 1.37* 1.12 1.26*  CALCIUM 9.0 8.4* 8.9   LFT Recent Labs    02/11/20 0238 02/11/20 1058 02/12/20 0149 02/13/20 0059  PROT 6.5  --  6.4* 7.4  ALBUMIN 2.6* 2.9* 2.5* 2.9*  AST 29  --  15 22  ALT 13  --  10 5  ALKPHOS 84  --  87 95  BILITOT 2.1*  --  1.4* 1.6*    Studies/Results: MR ABDOMEN W WO CONTRAST  Result Date: 02/13/2020 CLINICAL DATA:  Liver disease, chronic portal hypertension assessing in consideration for tips procedure. EXAM: MRI ABDOMEN WITHOUT  AND WITH CONTRAST TECHNIQUE: Multiplanar multisequence MR imaging of the abdomen was performed both before and after the administration of intravenous contrast. CONTRAST:  33mL GADAVIST GADOBUTROL 1 MMOL/ML IV SOLN COMPARISON:  Prior studies from 2016 and recent chest CT. FINDINGS: Lower chest: Large RIGHT pleural effusion. Basilar collapse os consolidation. Hepatobiliary: Lobular hepatic contours. Marked hypertrophy of the caudate lobe. Assessment of hepatic parenchyma and in general assessment of abdominal structures is limited due to motion related artifact presence of ascites. The liver shows increased nodularity and parenchymal distortion since the previous study with suggestion of peripheral biliary duct distension as well, in addition to marked caudate hypertrophy No significant fat or iron deposition grossly accounting for limitations of the exam. Associated splenomegaly. The main portal vein, SMV and splenic veins are grossly patent. There is either extrinsic or intrinsic abnormality of the main portal vein outlined below Either diminutive or occluded RIGHT portal vein, a faint contrast filled structure extends from the portal venous bifurcation towards the RIGHT hepatic lobe perhaps small branch of the RIGHT portal vein. This is best seen on images 67 through 55. Limited visualization of hepatic venous structures, perhaps diminutive hepatic venous structures across the top of the hypertrophied central hepatic parenchyma. Better seen on coronal images  where the middle hepatic vein can be visualized perhaps limited visualization also of a diminutive RIGHT hepatic vein. Hepatic cysts are present. Further evaluation of hepatic parenchyma is limited. No extrahepatic biliary duct dilation grossly with signs of cholelithiasis and biliary sludge. Pancreas: Hypointense area along the interface between the pancreas and the splenic portal confluence best seen on images 84 through 87 of series 804. Mixed signal on T2  weighted images in this location best seen on image 26 of series 4 with 1 area showing a lenticular configuration measuring approximately 1.6 x 1.3 cm. Another area of signal variation appearing more linear within or along the main portal vein along the anterior aspect of the main portal vein image 25 of series 4. Spleen: Splenomegaly with 17 cm craniocaudal span. Areas of hypointensity on T2 may reflect sequela of prior splenic infarct. No focal mass lesion. Adrenals/Urinary Tract: Adrenal glands are grossly normal. There is no hydronephrosis. The kidneys enhance symmetrically. Stomach/Bowel: Gastrointestinal tract with signs of edema in the setting of portal hypertension. Small volume ascites about the GI tract. Portion of the small bowel may extend into a ascites and fat containing ventral/umbilical hernia. Vascular/Lymphatic: Vascular abnormalities as described. Diminutive LEFT renal vein as it crosses beneath the SMA possible mild splenorenal shunting and some lumbar collaterals. Patency in direct continuity with the IVC cannot be assessed. Presence of lumbar collateral suggest is a chronic finding and the symmetry of renal enhancement is also supportive of this possibility. Other: Small volume ascites and signs of bowel edema in the setting of portal hypertension. Musculoskeletal: No suspicious bone lesions identified. IMPRESSION: Hepatic hydrothorax. Diminutive, nearly occluded RIGHT portal branches with very diminutive hepatic venous branches tracking over the hypertrophied central RIGHT hepatic lobe and caudate. Potential nonocclusive filling defect in main portal vein versus extrinsic compression. Lenticular area in the pancreas is more likely to be extrinsic, correlate with any recent history of pancreatitis. Area is not well assessed in could also represent a small cystic pancreatic neoplasm but shows no enhancement or gross pancreatic ductal dilation. Signs of advanced liver disease/cirrhosis with  portal hypertension including varices and splenomegaly. Morphologic changes of the liver are more akin to what can be seen in the setting of PSC and are markedly worse than on the study from 2016 in terms of caudate hypertrophy. Would correlate with any primary biliary abnormalities or intervening history of cholangitis though findings remain nonspecific in the setting of chronic liver disease. Marked narrowing of the renal vein as it crosses beneath the SMV. Patency in direct continuity with the IVC cannot be assessed though this is suspected to be a chronic finding given symmetry of renal enhancement and presence of lumbar collaterals. Cholelithiasis with biliary sludge. Additional imaging with CT may be of benefit to further evaluate the portal vein in particular given the motion artifact that is present on the current examination. An additional sequence may be attempted on MRI to further clarify above findings including the renal venous finding. However, if this is not possible or images are inadequate for assessment, CT may ultimately be beneficial to further map vascular structures as warranted. These results will be called to the ordering clinician or representative by the Radiologist Assistant, and communication documented in the PACS or [employer]. Electronically Signed   By: Donzetta Kohut M.D.   On: 02/13/2020 08:45   US Abdomen Limited  Result Date: 02/12/2020 CLINICAL DATA:  Liver fibrosis.  Rule out ascites. EXAM: LIMITED ABDOMEN ULTRASOUND FOR ASCITES TECHNIQUE: Limited ultrasound survey  for ascites was performed in all four abdominal quadrants. COMPARISON:  Ultrasound abdomen 12/25/2014 FINDINGS: Small amount ascites in the abdomen. Small ascites in the right upper quadrant and left lower quadrant. Large right pleural effusion. IMPRESSION: Small amount of ascites. Large right pleural effusion. Electronically Signed   By: Marlan Palau M.D.   On: 02/12/2020 14:39   DG CHEST PORT 1  VIEW  Result Date: 02/12/2020 CLINICAL DATA:  Pleural effusion. EXAM: PORTABLE CHEST 1 VIEW COMPARISON:  Chest x-ray 02/11/2020. FINDINGS: Large right pleural effusion again noted. Interval progression from prior exam. Underlying atelectasis/infiltrate most likely present. Left lung is clear. Heart size difficult to evaluate due to large right pleural effusion. No pneumothorax. IMPRESSION: Large right pleural effusion again noted. Interval progression from prior exam. Underlying atelectasis/infiltrate most likely present. Electronically Signed   By: Maisie Fus  Register   On: 02/12/2020 06:19   DG CHEST PORT 1 VIEW  Result Date: 02/11/2020 CLINICAL DATA:  Status post thoracentesis. EXAM: PORTABLE CHEST 1 VIEW COMPARISON:  February 09, 2020. FINDINGS: Moderate right pleural effusion is unchanged, with probable underlying atelectasis or infiltrate. No pneumothorax is noted. Left lung is clear. Bony thorax is unremarkable. IMPRESSION: Stable moderate right pleural effusion with probable underlying atelectasis or infiltrate. No pneumothorax is noted. Electronically Signed   By: Lupita Raider M.D.   On: 02/11/2020 15:16    ASSESMENT:   *   Pleural effusion.  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   Hx ascites, tapped x 2 in 12/2018.  Small volume abd ascites per MRI Fluid re-accumulated rapidly after thoracentesis 02/11/20.  Last CXR was 1/4.      *   Borderline renal fx and hyponatremia.  Increased doses diuretics to aldactone 150/lasix 60 today 02/13/20  w caution .  TID midodrine added 1/4 by attending "hopefully gives [place] more room for diuresis".   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  MRI reveals advanced/progressive cirrhosis with portal hypertension, varices.  Diminutive, nearly occluded right portal branches and very diminutive hepatic venous branches in central right hepatic lobe and caudate.  *    Potential nonocclusive filling defect of main PV versus extrinsic  compression. 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    *   Renal artery narrowing. AKI, mild.  This mornings slight worsening does not yet reflect the larger doses of diuretics.    *    Uncomplicated cholelithiasis and biliary sludge.  *   Abnormal pancreas imaging per MRI.    *   Dysphagia in pt w parkinson's.     *   Previous banding of esoph varices, last in 07/2019.  On Inderal.    *   Hyponatremia.    *   Splenomegaly. ?  Splenic hypointensity signal on MRI,  ?sequela of previous splenic infarct  *   Bilateral inguinal and umbilical hernias.   PLAN    *   At some point pursue CT to further evaluate multiple abnormalities noted on MRI?  *   Awaiting echo cardiogram as part of eval for potential TIPS  *   Given the multiple anatomic abnormalities seen on MRI, ? is patient a TIPS candidate?    Jennye Moccasin  02/13/2020, 10:00 AM Phone [phone]  Addendum: I have taken an interval history, reviewed the chart, and examined the patient. I agree with the Advanced Practitioner's note and impression.  MRI abdomen with contrast done and complex findings.  Certainly evidence of  advanced liver disease and cirrhosis with portal hypertension, intra-abdominal varices and splenomegaly.  Difficult for radiology to assess completely the portal venous system and the question of portal vein thrombus is raised.  Small volume intra-abdominal ascites with ventral hernia.  Gallstones without overt inflammation of the gallbladder.  Discussed MRI findings with the patient.  Electrolytes stable though creatinine is slightly worse on increased dose of furosemide and spironolactone, 50% dose increase started yesterday  Echocardiogram waiting to be done  We discussed how we need to continue gathering information to determine if TIPS would even be a possible option for hepatic hydrothorax.  In the interim we are trying to maximize diuretic therapy without compromising  sodium and renal function.  I am concerned he may not tolerate the higher doses of diuretics.  I am going to order CT venogram to help radiology better assess the portal structures and portal patency.  Also attention to pancreatic/portal interface.

## 2020-02-13 NOTE — Progress Notes (Signed)
Physical Therapy Treatment Patient Details Name: [name] MRN: [id number] DOB: 24-Jan-1941 Today's Date: 02/13/2020    History of Present Illness 80 y.o. male admitted with chest tightness and difficulty breathing.  PMH pertinent for parkinsons GERD and history of esophageal stricture, cirrhosis of liver with portal hypertension and esophageal varices and thrombocytopenia, OSA on CPAP,, A. fib, aortic valve disorder, anemia, BPH, CAD, history of colon cancer status post chemotherapy which he thinks caused cirrhosis. Workup reveals pleural effusion.    PT Comments    Pt up in chair on arrival, agreeable to therapy session and with good participation and tolerance for mobility. Pt quick to fatigue but very motivated, able to perform gait trial in hallway ~74ft using RW and min guard (SpO2 not reading properly -fingers too cold), but able to tolerate on RA without dyspnea. Pt performed supine/seated BLE AROM therapeutic exercises and given HEP handout to perform TID as able. Pt continues to benefit from PT services to progress toward functional mobility goals. D/C recs below remain appropriate.   Follow Up Recommendations  No PT follow up     Equipment Recommendations  Other (comment) (may benefit from RW, will continue to assess)    Recommendations for Other Services       Precautions / Restrictions Precautions Precautions: None Restrictions Weight Bearing Restrictions: No    Mobility  Bed Mobility               General bed mobility comments: pt up in chair pre/post  Transfers Overall transfer level: Needs assistance Equipment used: Rolling walker (2 wheeled) Transfers: Sit to/from Stand Sit to Stand: Min guard         General transfer comment: min guard for safety  Ambulation/Gait Ambulation/Gait assistance: Min guard Gait Distance (Feet): 90 Feet Assistive device: Rolling walker (2 wheeled) Gait Pattern/deviations: Step-through pattern;Festinating;Narrow  base of support Gait velocity: decreased   General Gait Details: pt using RW due to mild unsteadiness, no significant dypsnea on exertion, tried to get SpO2 however poor pleth reading on fingers, RN notified; RW adjusted higher for proper fit   Stairs             Wheelchair Mobility    Modified Rankin (Stroke Patients Only)       Balance Overall balance assessment: Needs assistance Sitting-balance support: Feet supported;No upper extremity supported Sitting balance-Leahy Scale: Good     Standing balance support: No upper extremity supported Standing balance-Leahy Scale: Fair Standing balance comment: fair static standing balance unsupported, able to stand with min guard while donning pants but used RW for dynamic standing tasks as pt quick to fatigue                            Cognition Arousal/Alertness: Awake/alert Behavior During Therapy: WFL for tasks assessed/performed Overall Cognitive Status: Within Functional Limits for tasks assessed                                 General Comments: HoH (hearing aide in L ear)      Exercises General Exercises - Upper Extremity Shoulder Flexion: AROM;Strengthening;Both;10 reps;Seated General Exercises - Lower Extremity Ankle Circles/Pumps: AROM;Strengthening;Both;10 reps;Supine Quad Sets: AROM;Strengthening;Both;10 reps;Supine Gluteal Sets: AROM;Strengthening;Both;10 reps;Supine Short Arc Quad: AROM;Strengthening;Both;10 reps;Supine Long Arc Quad: AROM;Strengthening;Both;10 reps;Seated Heel Slides: AROM;Strengthening;Both;10 reps;Supine Hip ABduction/ADduction: AROM;Strengthening;Both;10 reps;Sidelying    General Comments        Pertinent Vitals/Pain  Pain Assessment: No/denies pain Faces Pain Scale: No hurt Pain Intervention(s): Monitored during session;Repositioned    Home Living                      Prior Function            PT Goals (current goals can now be found in the  care plan section) Acute Rehab PT Goals Patient Stated Goal: breathe better PT Goal Formulation: With patient Time For Goal Achievement: 02/16/20 Potential to Achieve Goals: Good Progress towards PT goals: Progressing toward goals    Frequency    Min 3X/week      PT Plan Current plan remains appropriate    Co-evaluation              AM-PAC PT "6 Clicks" Mobility   Outcome Measure  Help needed turning from your back to your side while in a flat bed without using bedrails?: None Help needed moving from lying on your back to sitting on the side of a flat bed without using bedrails?: A Little Help needed moving to and from a bed to a chair (including a wheelchair)?: A Little Help needed standing up from a chair using your arms (e.g., wheelchair or bedside chair)?: A Little Help needed to walk in hospital room?: A Little Help needed climbing 3-5 steps with a railing? : A Little 6 Click Score: 19    End of Session Equipment Utilized During Treatment: Gait belt Activity Tolerance: Patient tolerated treatment well Patient left: in chair;with call bell/phone within reach (lab tech entering room) Nurse Communication: Mobility status PT Visit Diagnosis: Unsteadiness on feet (R26.81);Other abnormalities of gait and mobility (R26.89)     Time: 9381-0175 PT Time Calculation (min) (ACUTE ONLY): 23 min  Charges:  $Gait Training: 8-22 mins $Therapeutic Exercise: 8-22 mins                     Nyilah Kight P., PTA Acute Rehabilitation Services Pager: [phone] Office: [phone]   [place] 02/13/2020, 3:25 PM

## 2020-02-13 NOTE — Progress Notes (Signed)
PROGRESS NOTE    [name]  DXA:[id number] DOB: 08-Nov-1940 DOA: 02/09/2020 PCP: Geoffry Paradise, MD  Chief Complaint  Patient presents with  . Chest Pain  . Shortness of Breath   Brief Narrative:  [name] is a 80 y.o. male with a pertinent history of  parkinsons GERD and history of esophageal stricture, cirrhosis of liver with portal hypertension and esophageal varices and thrombocytopenia, OSA on CPAP,, A. fib, aortic valve disorder, anemia, BPH, CAD, history of colon cancer status post chemotherapy which he thinks caused cirrhosis.  He states he has noticed Right lower chest pain, pleuriticin nature and he can't get a good breath.  He states they really noticed it yesterday after patient was eating [place] and a hamburger and had an aspiration event of hamburger they think.  He denies any fevers or chills or sick contacts but since then.  We thought this could have been coincidental as it takes something for someone to notice it.  Daughter helps corroborate the story.  In the emergency department 118/73, 98.1, HR 95, 95% on room air, RR 18.  WBC 8.9, Hgb 12.8, PLT 149, NA 130, K4.5, CL 93, CO2 26, SCR 1.40, glucose 150  CTa IMPRESSION: 1. No definite evidence of pulmonary embolus. 2. Large right pleural effusion is noted which results in right to left mediastinal shift and complete atelectasis of the right lower lobe. Partial atelectasis of the right upper and middle lobes is noted. Minimal left pleural effusion is noted with adjacent subsegmental atelectasis. No abx were given.  SLP previously showed mild aspiration risk.  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  He had thoracentesis 1/3, but had quick reaccumulation of fluid on repeat CXR on 1/4.  Plan for GI consult, sodium restriction, diuresis.  May need repeat thoracentesis.  May need discussion of TIPS with IR.      Assessment & Plan:   Active  Problems:   Pleural effusion on right   Pleural effusion   Pressure injury of skin   Protein-calorie malnutrition, severe  Right Sided Pleural Effusion CT with large R pleural effusion  pulm consult, appreciate assistance S/p thoracentesis by pulm 1/3 -> 1400 cc straw colored fluid.  Consistent with transudative. 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  They have obtained MRI abdomen.  Appreciate GI help for managing this case. Follow culture (NG <24 hrs).   Follow cytology. Lower suspicion for infection at this time, continue to monitor   Cirrhosis with hx ascites Continue lasix, spironolactone  propanolol US abdomen limited with small amount of ascites, large right effusion GI consult as noted above, appreciate recs  AKI  NAGMA: improving, follow with diuretics  Hyponatremia: Has been stable around 130 since last 4 days.  Monitor daily.  Parkinsons Continue sinemet Holding adderall for now  Hx colon cancer S/p colectomy and chemotherapy Follows with Dr. Truett Perna - in clinical remission   Anemia  Thrombocytopenia: continue to monitor   Prediabetes: follow a1c (6), follow outpatient   Muscle Spasms: trial flexeril at night  Body mass index is 18.31 kg/m.  DVT prophylaxis: heparin Code Status: full  Family Communication: none  at bedside Disposition:   Status is: Inpatient  Remains inpatient appropriate because:Inpatient level of care appropriate due to severity of illness   Dispo: The patient is from: Home              Anticipated d/c is to: pending              Anticipated d/c date is: > 3 days               Patient currently is not medically stable to d/c.  Consultants:   pulm   Procedures:  none  Antimicrobials:  Anti-infectives (From admission, onward)   None         Subjective: Patient seen and examined.  No specific complaint.  Generalized weakness.  Objective: Vitals:   02/12/20 0616 02/12/20 1437 02/12/20 2320 02/13/20 0616  BP: (!) 93/31 (!) 102/57 111/68 92/69  Pulse: 98 77 82 94  Resp: 20 17 17 18   Temp: (!) 97.5 F (36.4 C) 98.3 F (36.8 C) (!) 97.5 F (36.4 C) (!) 97.4 F (36.3 C)  TempSrc: Oral Oral Oral Oral  SpO2: 95% 97% 95% 94%  Weight:      Height:       No intake or output data in the 24 hours ending 02/13/20 1328 Filed Weights   02/09/20 0937  Weight: 61.2 kg    Examination:  General exam: Appears calm and comfortable but very thin, cachectic and chronically sick looking Respiratory system: Slightly diminished breath sounds at the right base but clear elsewhere. Respiratory effort normal. Cardiovascular system: S1 & S2 heard, RRR. No JVD, murmurs, rubs, gallops or clicks. No pedal edema. Gastrointestinal system: Abdomen is nondistended, soft and nontender. No organomegaly or masses felt. Normal bowel sounds heard. Central nervous system: Alert and oriented. No focal neurological deficits. Extremities: Symmetric 5 x 5 power. Skin: No rashes, lesions or ulcers.  Psychiatry: Judgement and insight appear normal. Mood & affect appropriate.   Data Reviewed: I have personally reviewed following labs and imaging studies  CBC: Recent Labs  Lab 02/09/20 0946 02/10/20 1014 02/11/20 0238 02/12/20 0149 02/13/20 0059  WBC 8.9 5.4 5.5 4.9 8.0  NEUTROABS  --  4.3 4.4 3.8  --   HGB 12.8* 12.4* 12.5* 11.9* 14.1  HCT 37.5* 33.7* 33.6* 33.6* 40.4  MCV 89.5 86.9 85.7 86.4 87.1  PLT 149* 123* 161 122* 0000000    Basic Metabolic Panel: Recent Labs  Lab 02/09/20 0946 02/10/20 1014 02/11/20 0238 02/12/20 0149 02/13/20 0059  NA 130* 130* 131* 129*  129*  K 4.5 4.3 4.8 4.3 4.6  CL 93* 96* 94* 97* 94*  CO2 26 19* 25 23 25   GLUCOSE 150* 140* 126* 122* 183*  BUN 27* 28* 35* 34* 30*  CREATININE 1.40* 1.14 1.37* 1.12 1.26*  CALCIUM 9.2 9.0 9.0 8.4* 8.9  MG  --   --  1.9 1.9  --   PHOS  --   --  3.7 2.8  --     GFR: Estimated Creatinine Clearance: 41.8 mL/min (A) (by C-G formula based on SCr of 1.26 mg/dL (H)).  Liver Function Tests: Recent Labs  Lab 02/09/20 2010 02/10/20 1014 02/11/20 0238 02/11/20 1058 02/12/20 0149 02/13/20 0059  AST 20 20 29   --  15 22  ALT 16 21 13   --  10 5  ALKPHOS 89 83 84  --  87 95  BILITOT 3.3* 2.8* 2.1*  --  1.4* 1.6*  PROT 6.9 6.4* 6.5  --  6.4* 7.4  ALBUMIN 2.8* 2.6* 2.6* 2.9* 2.5* 2.9*    CBG: No results for input(s): GLUCAP in the last 168 hours.   Recent Results (from the past 240 hour(s))  Resp Panel by RT-PCR (Flu A&B, Covid) Nasopharyngeal Swab     Status: None   Collection Time: 02/09/20  5:28 PM   Specimen: Nasopharyngeal Swab; Nasopharyngeal(NP) swabs in vial transport medium  Result Value Ref Range Status   SARS Coronavirus 2 by RT PCR NEGATIVE NEGATIVE Final    Comment: (NOTE) SARS-CoV-2 target nucleic acids are NOT DETECTED.  The SARS-CoV-2 RNA is generally detectable in upper respiratory specimens during the acute phase of infection. The lowest concentration of SARS-CoV-2 viral copies this assay can detect is 138 copies/mL. A negative result does not preclude SARS-Cov-2 infection and should not be used as the sole basis for treatment or other patient management decisions. A negative result may occur with  improper specimen collection/handling, submission of specimen other than nasopharyngeal swab, presence of viral mutation(s) within the areas targeted by this assay, and inadequate number of viral copies(<138 copies/mL). A negative result must be combined with clinical observations, patient history, and epidemiological information. The expected result is  Negative.  Fact Sheet for Patients:  EntrepreneurPulse.com.au  Fact Sheet for Healthcare Providers:  IncredibleEmployment.be  This test is no t yet approved or cleared by the [place] FDA and  has been authorized for detection and/or diagnosis of SARS-CoV-2 by FDA under an Emergency Use Authorization (EUA). This EUA will remain  in effect (meaning this test can be used) for the duration of the COVID-19 declaration under Section 564(b)(1) of the Act, 21 U.S.C.section 360bbb-3(b)(1), unless the authorization is terminated  or revoked sooner.       Influenza A by PCR NEGATIVE NEGATIVE Final   Influenza B by PCR NEGATIVE NEGATIVE Final    Comment: (NOTE) The Xpert Xpress SARS-CoV-2/FLU/RSV plus assay is intended as an aid in the diagnosis of influenza from Nasopharyngeal swab specimens and should not be used as a sole basis for treatment. Nasal washings and aspirates are unacceptable for Xpert Xpress SARS-CoV-2/FLU/RSV testing.  Fact Sheet for Patients: EntrepreneurPulse.com.au  Fact Sheet for Healthcare Providers: IncredibleEmployment.be  This test is not yet approved or cleared by the [place] FDA and has been authorized for detection and/or diagnosis of SARS-CoV-2 by FDA under an Emergency Use Authorization (EUA). This EUA will remain in effect (meaning this test can be used) for the duration of the COVID-19 declaration under Section 564(b)(1) of the Act, 21 U.S.C. section 360bbb-3(b)(1), unless the authorization is terminated or revoked.  Performed at [facility], [place] [address], [place], [place] [address]   Pleural Fluid culture (includes gram stain)     Status: None (Preliminary result)   Collection Time: 02/11/20  2:18 PM   Specimen: Pleural Fluid  Result Value Ref Range Status   Specimen Description PLEURAL FLUID  Final   Special Requests NONE  Final   Gram Stain   Final     WBC PRESENT,BOTH PMN AND MONONUCLEAR NO ORGANISMS SEEN CYTOSPIN SMEAR    Culture   Final    NO GROWTH 2 DAYS Performed at [facility], [address] [place], [place] [address]    Report Status PENDING  Incomplete         Radiology Studies: MR ABDOMEN W WO CONTRAST  Addendum Date: 02/13/2020   ADDENDUM REPORT: 02/13/2020 11:29 ADDENDUM: Question of nonocclusive filling defect in the anterior portal vein  on the additional images which were performed to provide some clarification as to portal abnormalities. This could be artifactual and related to slow flow but is seen also on contrasted imaging. For this reason, would consider CT venogram for further assessment to better delineate portal structures as well as portal patency particularly in the RIGHT lobe of the liver. This would also provide additional information regarding the pancreatic/portal interface and possibility of small focal cystic area in the midportion of the pancreas. Would also correlate with lipase to ensure there is no active pancreatic inflammation. These results will be called to the ordering clinician or representative by the Radiologist Assistant, and communication documented in the PACS or [employer]. Electronically Signed   By: Zetta Bills M.D.   On: 02/13/2020 11:29   Result Date: 02/13/2020 CLINICAL DATA:  Liver disease, chronic portal hypertension assessing in consideration for tips procedure. EXAM: MRI ABDOMEN WITHOUT AND WITH CONTRAST TECHNIQUE: Multiplanar multisequence MR imaging of the abdomen was performed both before and after the administration of intravenous contrast. CONTRAST:  62mL GADAVIST GADOBUTROL 1 MMOL/ML IV SOLN COMPARISON:  Prior studies from 2016 and recent chest CT. FINDINGS: Lower chest: Large RIGHT pleural effusion. Basilar collapse os consolidation. Hepatobiliary: Lobular hepatic contours. Marked hypertrophy of the caudate lobe. Assessment of hepatic parenchyma and in general  assessment of abdominal structures is limited due to motion related artifact presence of ascites. The liver shows increased nodularity and parenchymal distortion since the previous study with suggestion of peripheral biliary duct distension as well, in addition to marked caudate hypertrophy No significant fat or iron deposition grossly accounting for limitations of the exam. Associated splenomegaly. The main portal vein, SMV and splenic veins are grossly patent. There is either extrinsic or intrinsic abnormality of the main portal vein outlined below Either diminutive or occluded RIGHT portal vein, a faint contrast filled structure extends from the portal venous bifurcation towards the RIGHT hepatic lobe perhaps small branch of the RIGHT portal vein. This is best seen on images 67 through 55. Limited visualization of hepatic venous structures, perhaps diminutive hepatic venous structures across the top of the hypertrophied central hepatic parenchyma. Better seen on coronal images where the middle hepatic vein can be visualized perhaps limited visualization also of a diminutive RIGHT hepatic vein. Hepatic cysts are present. Further evaluation of hepatic parenchyma is limited. No extrahepatic biliary duct dilation grossly with signs of cholelithiasis and biliary sludge. Pancreas: Hypointense area along the interface between the pancreas and the splenic portal confluence best seen on images 84 through 87 of series 804. Mixed signal on T2 weighted images in this location best seen on image 26 of series 4 with 1 area showing a lenticular configuration measuring approximately 1.6 x 1.3 cm. Another area of signal variation appearing more linear within or along the main portal vein along the anterior aspect of the main portal vein image 25 of series 4. Spleen: Splenomegaly with 17 cm craniocaudal span. Areas of hypointensity on T2 may reflect sequela of prior splenic infarct. No focal mass lesion. Adrenals/Urinary Tract:  Adrenal glands are grossly normal. There is no hydronephrosis. The kidneys enhance symmetrically. Stomach/Bowel: Gastrointestinal tract with signs of edema in the setting of portal hypertension. Small volume ascites about the GI tract. Portion of the small bowel may extend into a ascites and fat containing ventral/umbilical hernia. Vascular/Lymphatic: Vascular abnormalities as described. Diminutive LEFT renal vein as it crosses beneath the SMA possible mild splenorenal shunting and some lumbar collaterals. Patency in direct continuity with the  IVC cannot be assessed. Presence of lumbar collateral suggest is a chronic finding and the symmetry of renal enhancement is also supportive of this possibility. Other: Small volume ascites and signs of bowel edema in the setting of portal hypertension. Musculoskeletal: No suspicious bone lesions identified. IMPRESSION: Hepatic hydrothorax. Diminutive, nearly occluded RIGHT portal branches with very diminutive hepatic venous branches tracking over the hypertrophied central RIGHT hepatic lobe and caudate. Potential nonocclusive filling defect in main portal vein versus extrinsic compression. Lenticular area in the pancreas is more likely to be extrinsic, correlate with any recent history of pancreatitis. Area is not well assessed in could also represent a small cystic pancreatic neoplasm but shows no enhancement or gross pancreatic ductal dilation. Signs of advanced liver disease/cirrhosis with portal hypertension including varices and splenomegaly. Morphologic changes of the liver are more akin to what can be seen in the setting of [place] and are markedly worse than on the study from 2016 in terms of caudate hypertrophy. Would correlate with any primary biliary abnormalities or intervening history of cholangitis though findings remain nonspecific in the setting of chronic liver disease. Marked narrowing of the renal vein as it crosses beneath the SMV. Patency in direct continuity  with the IVC cannot be assessed though this is suspected to be a chronic finding given symmetry of renal enhancement and presence of lumbar collaterals. Cholelithiasis with biliary sludge. Additional imaging with CT may be of benefit to further evaluate the portal vein in particular given the motion artifact that is present on the current examination. An additional sequence may be attempted on MRI to further clarify above findings including the renal venous finding. However, if this is not possible or images are inadequate for assessment, CT may ultimately be beneficial to further map vascular structures as warranted. These results will be called to the ordering clinician or representative by the Radiologist Assistant, and communication documented in the PACS or [employer]. Electronically Signed: By: Zetta Bills M.D. On: 02/13/2020 08:45   US Abdomen Limited  Result Date: 02/12/2020 CLINICAL DATA:  Liver fibrosis.  Rule out ascites. EXAM: LIMITED ABDOMEN ULTRASOUND FOR ASCITES TECHNIQUE: Limited ultrasound survey for ascites was performed in all four abdominal quadrants. COMPARISON:  Ultrasound abdomen 12/25/2014 FINDINGS: Small amount ascites in the abdomen. Small ascites in the right upper quadrant and left lower quadrant. Large right pleural effusion. IMPRESSION: Small amount of ascites. Large right pleural effusion. Electronically Signed   By: Franchot Gallo M.D.   On: 02/12/2020 14:39   DG CHEST PORT 1 VIEW  Result Date: 02/12/2020 CLINICAL DATA:  Pleural effusion. EXAM: PORTABLE CHEST 1 VIEW COMPARISON:  Chest x-ray 02/11/2020. FINDINGS: Large right pleural effusion again noted. Interval progression from prior exam. Underlying atelectasis/infiltrate most likely present. Left lung is clear. Heart size difficult to evaluate due to large right pleural effusion. No pneumothorax. IMPRESSION: Large right pleural effusion again noted. Interval progression from prior exam. Underlying  atelectasis/infiltrate most likely present. Electronically Signed   By: Marcello Moores  Register   On: 02/12/2020 06:19   DG CHEST PORT 1 VIEW  Result Date: 02/11/2020 CLINICAL DATA:  Status post thoracentesis. EXAM: PORTABLE CHEST 1 VIEW COMPARISON:  February 09, 2020. FINDINGS: Moderate right pleural effusion is unchanged, with probable underlying atelectasis or infiltrate. No pneumothorax is noted. Left lung is clear. Bony thorax is unremarkable. IMPRESSION: Stable moderate right pleural effusion with probable underlying atelectasis or infiltrate. No pneumothorax is noted. Electronically Signed   By: Marijo Conception M.D.   On: 02/11/2020  15:16        Scheduled Meds: . carbidopa-levodopa  1 tablet Oral BID AC  . cyclobenzaprine  5 mg Oral QHS  . enoxaparin (LOVENOX) injection  40 mg Subcutaneous QHS  . feeding supplement  237 mL Oral BID BM  . fentaNYL (SUBLIMAZE) injection  12.5 mcg Intravenous Once  . furosemide  60 mg Oral Daily  . LORazepam  0.5 mg Intravenous Once  . midodrine  5 mg Oral TID WC  . multivitamin with minerals  1 tablet Oral Daily  . pantoprazole  40 mg Oral BID  . propranolol  20 mg Oral BID  . spironolactone  150 mg Oral Daily   Continuous Infusions:   LOS: 3 days    Time spent: 35 minutes  Darliss Cheney, MD [facility]  To contact the attending provider between 7A-7P or the covering provider during after hours 7P-7A, please log into the web site [email or web address] and access using universal [place] password for that web site. If you do not have the password, please call the hospital operator.  02/13/2020, 1:28 PM

## 2020-02-13 NOTE — Progress Notes (Signed)
Received call from CT stating that order needs to be changed to CT Abd/Liver per Dr. Manson Passey for patient to have CT scan done as requested by gastroenterology. Paged on call MD answering services. Waiting for return page.

## 2020-02-13 NOTE — Progress Notes (Signed)
  Echocardiogram 2D Echocardiogram has been performed.  [name] 02/13/2020, 6:00 PM

## 2020-02-14 ENCOUNTER — Inpatient Hospital Stay (HOSPITAL_COMMUNITY): Payer: Medicare Other

## 2020-02-14 DIAGNOSIS — R932 Abnormal findings on diagnostic imaging of liver and biliary tract: Secondary | ICD-10-CM | POA: Diagnosis not present

## 2020-02-14 LAB — COMPREHENSIVE METABOLIC PANEL
ALT: 7 U/L (ref 0–44)
AST: 16 U/L (ref 15–41)
Albumin: 2.5 g/dL — ABNORMAL LOW (ref 3.5–5.0)
Alkaline Phosphatase: 102 U/L (ref 38–126)
Anion gap: 11 (ref 5–15)
BUN: 33 mg/dL — ABNORMAL HIGH (ref 8–23)
CO2: 23 mmol/L (ref 22–32)
Calcium: 8.3 mg/dL — ABNORMAL LOW (ref 8.9–10.3)
Chloride: 92 mmol/L — ABNORMAL LOW (ref 98–111)
Creatinine, Ser: 1.32 mg/dL — ABNORMAL HIGH (ref 0.61–1.24)
GFR, Estimated: 55 mL/min — ABNORMAL LOW (ref >60)
Glucose, Bld: 166 mg/dL — ABNORMAL HIGH (ref 70–99)
Potassium: 4.1 mmol/L (ref 3.5–5.1)
Sodium: 126 mmol/L — ABNORMAL LOW (ref 135–145)
Total Bilirubin: 1.1 mg/dL (ref 0.3–1.2)
Total Protein: 6.2 g/dL — ABNORMAL LOW (ref 6.5–8.1)

## 2020-02-14 LAB — CBC WITH DIFFERENTIAL/PLATELET
Abs Immature Granulocytes: 0.03 10*3/uL (ref 0.00–0.07)
Basophils Absolute: 0 10*3/uL (ref 0.0–0.1)
Basophils Relative: 0 %
Eosinophils Absolute: 0 10*3/uL (ref 0.0–0.5)
Eosinophils Relative: 1 %
HCT: 35 % — ABNORMAL LOW (ref 39.0–52.0)
Hemoglobin: 12.2 g/dL — ABNORMAL LOW (ref 13.0–17.0)
Immature Granulocytes: 1 %
Lymphocytes Relative: 13 %
Lymphs Abs: 0.7 10*3/uL (ref 0.7–4.0)
MCH: 30.5 pg (ref 26.0–34.0)
MCHC: 34.9 g/dL (ref 30.0–36.0)
MCV: 87.5 fL (ref 80.0–100.0)
Monocytes Absolute: 0.7 10*3/uL (ref 0.1–1.0)
Monocytes Relative: 13 %
Neutro Abs: 3.9 10*3/uL (ref 1.7–7.7)
Neutrophils Relative %: 72 %
Platelets: 141 10*3/uL — ABNORMAL LOW (ref 150–400)
RBC: 4 MIL/uL — ABNORMAL LOW (ref 4.22–5.81)
RDW: 13.3 % (ref 11.5–15.5)
WBC: 5.4 10*3/uL (ref 4.0–10.5)
nRBC: 0 % (ref 0.0–0.2)

## 2020-02-14 LAB — BASIC METABOLIC PANEL
Anion gap: 12 (ref 5–15)
BUN: 31 mg/dL — ABNORMAL HIGH (ref 8–23)
CO2: 25 mmol/L (ref 22–32)
Calcium: 9 mg/dL (ref 8.9–10.3)
Chloride: 95 mmol/L — ABNORMAL LOW (ref 98–111)
Creatinine, Ser: 1.31 mg/dL — ABNORMAL HIGH (ref 0.61–1.24)
GFR, Estimated: 56 mL/min — ABNORMAL LOW (ref >60)
Glucose, Bld: 145 mg/dL — ABNORMAL HIGH (ref 70–99)
Potassium: 3.9 mmol/L (ref 3.5–5.1)
Sodium: 132 mmol/L — ABNORMAL LOW (ref 135–145)

## 2020-02-14 LAB — BODY FLUID CULTURE: Culture: NO GROWTH

## 2020-02-14 MED ORDER — CYCLOBENZAPRINE HCL 10 MG PO TABS
10.0000 mg | ORAL_TABLET | Freq: Every day | ORAL | Status: DC
  Administered 2020-02-14 – 2020-02-15 (×2): 10 mg via ORAL
  Filled 2020-02-14 (×2): qty 1

## 2020-02-14 MED ORDER — IOHEXOL 350 MG/ML SOLN
100.0000 mL | Freq: Once | INTRAVENOUS | Status: AC | PRN
  Administered 2020-02-14: 100 mL via INTRAVENOUS

## 2020-02-14 NOTE — Progress Notes (Signed)
PROGRESS NOTE    [name]  [id number] DOB: 1940/09/11 DOA: 02/09/2020 PCP: Burnard Bunting, MD  Chief Complaint  Patient presents with  . Chest Pain  . Shortness of Breath   Brief Narrative:  [name] is a 80 y.o. male with a pertinent history of  parkinsons GERD and history of esophageal stricture, cirrhosis of liver with portal hypertension and esophageal varices and thrombocytopenia, OSA on CPAP,, A. fib, aortic valve disorder, anemia, BPH, CAD, history of colon cancer status post chemotherapy which he thinks caused cirrhosis.  He states he has noticed Right lower chest pain, pleuriticin nature and he can't get a good breath.  He states they really noticed it yesterday after patient was eating [place] and a hamburger and had an aspiration event of hamburger they think.  He denies any fevers or chills or sick contacts but since then.  We thought this could have been coincidental as it takes something for someone to notice it.  Daughter helps corroborate the story.  In the emergency department 118/73, 98.1, HR 95, 95% on room air, RR 18.  WBC 8.9, Hgb 12.8, PLT 149, NA 130, K4.5, CL 93, CO2 26, SCR 1.40, glucose 150  CTa IMPRESSION: 1. No definite evidence of pulmonary embolus. 2. Large right pleural effusion is noted which results in right to left mediastinal shift and complete atelectasis of the right lower lobe. Partial atelectasis of the right upper and middle lobes is noted. Minimal left pleural effusion is noted with adjacent subsegmental atelectasis. No abx were given.  SLP previously showed mild aspiration risk.  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  He had thoracentesis 1/3, but had quick reaccumulation of fluid on repeat CXR on 1/4.  Plan for GI consult, sodium restriction, diuresis.  May need repeat thoracentesis.  May need discussion of TIPS with IR.      Assessment & Plan:   Active  Problems:   Pleural effusion on right   Pleural effusion   Pressure injury of skin   Protein-calorie malnutrition, severe   Abnormal MRI, liver  Right Sided Pleural Effusion CT with large R pleural effusion  pulm consult, appreciate assistance S/p thoracentesis by pulm 1/3 -> 1400 cc straw colored fluid.  Consistent with transudative. 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  They have obtained MRI abdomen.  Now echo and CT venogram is pending.  Appreciate GI help for managing this case. Follow culture (NG <24 hrs).   Follow cytology. Lower suspicion for infection at this time, continue to monitor   Cirrhosis with hx ascites US abdomen limited with small amount of ascites, large right effusion GI consult as noted above, appreciate recs.  Continue propranolol.  It appears that GI had discontinued both Lasix and Aldactone due to drop in sodium to 126 however there has been so many erroneous reports on sodium level today at our campus so I have ordered a repeat BMP for this patient few hours ago which is still pending.  I have relayed my concerns to Dr. Billie Lade as well.  AKI  NAGMA: improving, follow with diuretics  Hyponatremia: Down to 126, likely illness.  Repeating BMP.  Parkinsons Continue sinemet Holding adderall for now  Hx colon cancer S/p colectomy and chemotherapy Follows with Dr. Truett Perna - in clinical remission   Anemia  Thrombocytopenia: continue to monitor   Prediabetes: Hemoglobin A1c 6.0.  Muscle Spasms: On low-dose Flexeril but he is still having spasm causing disruption in sleep.  Will increase the dose to  10 mg from 5 mg.  Body mass index is 18.31 kg/m.  DVT prophylaxis: heparin Code Status: full  Family Communication: none at bedside Disposition:   Status is: Inpatient  Remains inpatient appropriate because:Inpatient level of care appropriate due to severity of illness   Dispo: The patient is from: Home              Anticipated d/c is to: pending              Anticipated d/c date is: > 3 days              Patient currently is not medically stable to d/c.  Consultants:   pulm   Procedures:  none  Antimicrobials:  Anti-infectives (From admission, onward)   None         Subjective: Seen and examined.  No new complaint except leg spams which he believes is causing disruption in his sleep.  No other complaint.  Objective: Vitals:   02/13/20 1611 02/13/20 2128 02/14/20 0631 02/14/20 1354  BP: 107/62 (!) 91/59 99/69 (!) 104/58  Pulse: 77 82 81 87  Resp: 18 19 20 15   Temp: 98 F (36.7 C) 97.7 F (36.5 C) (!) 97.5 F (36.4 C) 98.5 F (36.9 C)  TempSrc: Oral Oral Oral Oral  SpO2: 90% 97% 95% 95%  Weight:      Height:        Intake/Output Summary (Last 24 hours) at 02/14/2020 1409 Last data filed at 02/14/2020 0631 Gross per 24 hour  Intake 237 ml  Output 200 ml  Net 37 ml   Filed Weights   02/09/20 0937  Weight: 61.2 kg    Examination:  General exam: Appears calm and comfortable but cachectic and chronically sick looking Respiratory system: Diminished breath sounds at the right base. Respiratory effort normal. Cardiovascular system: S1 & S2 heard, RRR. No JVD, murmurs, rubs, gallops or clicks. No pedal edema. Gastrointestinal system: Abdomen is nondistended, soft and nontender. No organomegaly or masses felt. Normal bowel sounds heard. Central nervous system: Alert and oriented. No focal neurological deficits. Extremities: Symmetric 5 x 5 power. Skin: No rashes, lesions or ulcers.  Psychiatry: Judgement and insight appear normal. Mood & affect  appropriate.    Data Reviewed: I have personally reviewed following labs and imaging studies  CBC: Recent Labs  Lab 02/10/20 1014 02/11/20 0238 02/12/20 0149 02/13/20 0059 02/14/20 0056  WBC 5.4 5.5 4.9 8.0 5.4  NEUTROABS 4.3 4.4 3.8  --  3.9  HGB 12.4* 12.5* 11.9* 14.1 12.2*  HCT 33.7* 33.6* 33.6* 40.4 35.0*  MCV 86.9 85.7 86.4 87.1 87.5  PLT 123* 161 122* 153 141*    Basic Metabolic Panel: Recent Labs  Lab 02/10/20 1014 02/11/20 0238 02/12/20 0149 02/13/20 0059 02/14/20 0056  NA 130* 131* 129* 129* 126*  K 4.3 4.8 4.3 4.6 4.1  CL 96* 94* 97* 94* 92*  CO2 19* 25 23 25 23   GLUCOSE 140* 126* 122* 183* 166*  BUN 28* 35* 34* 30* 33*  CREATININE 1.14 1.37* 1.12 1.26* 1.32*  CALCIUM 9.0 9.0 8.4* 8.9 8.3*  MG  --  1.9 1.9  --   --   PHOS  --  3.7 2.8  --   --     GFR: Estimated Creatinine Clearance: 39.9 mL/min (A) (by C-G formula based on SCr of 1.32 mg/dL (H)).  Liver Function Tests: Recent Labs  Lab 02/10/20 1014 02/11/20 0238 02/11/20 1058 02/12/20 0149 02/13/20 0059 02/14/20 0056  AST 20 29  --  15 22 16   ALT 21 13  --  10 5 7   ALKPHOS 83 84  --  87 95 102  BILITOT 2.8* 2.1*  --  1.4* 1.6* 1.1  PROT 6.4* 6.5  --  6.4* 7.4 6.2*  ALBUMIN 2.6* 2.6* 2.9* 2.5* 2.9* 2.5*    CBG: No results for input(s): GLUCAP in the last 168 hours.   Recent Results (from the past 240 hour(s))  Resp Panel by RT-PCR (Flu A&B, Covid) Nasopharyngeal Swab     Status: None   Collection Time: 02/09/20  5:28 PM   Specimen: Nasopharyngeal Swab; Nasopharyngeal(NP) swabs in vial transport medium  Result Value Ref Range Status   SARS Coronavirus 2 by RT PCR NEGATIVE NEGATIVE Final    Comment: (NOTE) SARS-CoV-2 target nucleic acids are NOT DETECTED.  The SARS-CoV-2 RNA is generally detectable in upper respiratory specimens during the acute phase of infection. The lowest concentration of SARS-CoV-2 viral copies this assay can detect is 138 copies/mL. A negative result does not  preclude SARS-Cov-2 infection and should not be used as the sole basis for treatment or other patient management decisions. A negative result may occur with  improper specimen collection/handling, submission of specimen other than nasopharyngeal swab, presence of viral mutation(s) within the areas targeted by this assay, and inadequate number of viral copies(<138 copies/mL). A negative result must be combined with clinical observations, patient history, and epidemiological information. The expected result is Negative.  Fact Sheet for Patients:  EntrepreneurPulse.com.au  Fact Sheet for Healthcare Providers:  IncredibleEmployment.be  This test is no t yet approved or cleared by the [place] FDA and  has been authorized for detection and/or diagnosis of SARS-CoV-2 by FDA under an Emergency Use Authorization (EUA). This EUA will remain  in effect (meaning this test can be used) for the duration of the COVID-19 declaration under Section 564(b)(1) of the Act, 21 U.S.C.section 360bbb-3(b)(1), unless the authorization is terminated  or revoked sooner.       Influenza A by PCR NEGATIVE NEGATIVE Final   Influenza B by PCR NEGATIVE NEGATIVE Final    Comment: (NOTE) The Xpert Xpress SARS-CoV-2/FLU/RSV plus assay is intended as an aid in the diagnosis of influenza from Nasopharyngeal swab specimens and should not be used as a sole basis for treatment. Nasal washings and aspirates are unacceptable for Xpert Xpress SARS-CoV-2/FLU/RSV testing.  Fact Sheet for Patients: EntrepreneurPulse.com.au  Fact Sheet for Healthcare Providers: IncredibleEmployment.be  This test is not yet approved or cleared by the [place] FDA and has been authorized for detection and/or diagnosis of SARS-CoV-2 by FDA under an Emergency Use Authorization (EUA). This EUA will remain in effect (meaning this test can be used) for the  duration of the COVID-19 declaration under Section 564(b)(1) of the Act, 21 U.S.C. section 360bbb-3(b)(1), unless the authorization is terminated or revoked.  Performed at [facility], [place] [address], [place], [place] [address]   Pleural Fluid culture (includes gram stain)     Status: None   Collection Time: 02/11/20  2:18 PM   Specimen: Pleural Fluid  Result Value Ref Range  Status   Specimen Description PLEURAL FLUID  Final   Special Requests NONE  Final   Gram Stain   Final    WBC PRESENT,BOTH PMN AND MONONUCLEAR NO ORGANISMS SEEN CYTOSPIN SMEAR    Culture   Final    NO GROWTH Performed at [facility], [address], [place], [place] [address]    Report Status 02/14/2020 FINAL  Final         Radiology Studies: MR ABDOMEN W WO CONTRAST  Addendum Date: 02/13/2020   ADDENDUM REPORT: 02/13/2020 11:29 ADDENDUM: Question of nonocclusive filling defect in the anterior portal vein on the additional images which were performed to provide some clarification as to portal abnormalities. This could be artifactual and related to slow flow but is seen also on contrasted imaging. For this reason, would consider CT venogram for further assessment to better delineate portal structures as well as portal patency particularly in the RIGHT lobe of the liver. This would also provide additional information regarding the pancreatic/portal interface and possibility of small focal cystic area in the midportion of the pancreas. Would also correlate with lipase to ensure there is no active pancreatic inflammation. These results will be called to the ordering clinician or representative by the Radiologist Assistant, and communication documented in the PACS or [employer]. Electronically Signed   By: Zetta Bills M.D.   On: 02/13/2020 11:29   Result Date: 02/13/2020 CLINICAL DATA:  Liver disease, chronic portal hypertension assessing in consideration for tips procedure. EXAM: MRI ABDOMEN  WITHOUT AND WITH CONTRAST TECHNIQUE: Multiplanar multisequence MR imaging of the abdomen was performed both before and after the administration of intravenous contrast. CONTRAST:  13mL GADAVIST GADOBUTROL 1 MMOL/ML IV SOLN COMPARISON:  Prior studies from 2016 and recent chest CT. FINDINGS: Lower chest: Large RIGHT pleural effusion. Basilar collapse os consolidation. Hepatobiliary: Lobular hepatic contours. Marked hypertrophy of the caudate lobe. Assessment of hepatic parenchyma and in general assessment of abdominal structures is limited due to motion related artifact presence of ascites. The liver shows increased nodularity and parenchymal distortion since the previous study with suggestion of peripheral biliary duct distension as well, in addition to marked caudate hypertrophy No significant fat or iron deposition grossly accounting for limitations of the exam. Associated splenomegaly. The main portal vein, SMV and splenic veins are grossly patent. There is either extrinsic or intrinsic abnormality of the main portal vein outlined below Either diminutive or occluded RIGHT portal vein, a faint contrast filled structure extends from the portal venous bifurcation towards the RIGHT hepatic lobe perhaps small branch of the RIGHT portal vein. This is best seen on images 67 through 55. Limited visualization of hepatic venous structures, perhaps diminutive hepatic venous structures across the top of the hypertrophied central hepatic parenchyma. Better seen on coronal images where the middle hepatic vein can be visualized perhaps limited visualization also of a diminutive RIGHT hepatic vein. Hepatic cysts are present. Further evaluation of hepatic parenchyma is limited. No extrahepatic biliary duct dilation grossly with signs of cholelithiasis and biliary sludge. Pancreas: Hypointense area along the interface between the pancreas and the splenic portal confluence best seen on images 84 through 87 of series 804. Mixed signal  on T2 weighted images in this location best seen on image 26 of series 4 with 1 area showing a lenticular configuration measuring approximately 1.6 x 1.3 cm. Another area of signal variation appearing more linear within or along the main portal vein along the anterior aspect of the main portal vein image  25 of series 4. Spleen: Splenomegaly with 17 cm craniocaudal span. Areas of hypointensity on T2 may reflect sequela of prior splenic infarct. No focal mass lesion. Adrenals/Urinary Tract: Adrenal glands are grossly normal. There is no hydronephrosis. The kidneys enhance symmetrically. Stomach/Bowel: Gastrointestinal tract with signs of edema in the setting of portal hypertension. Small volume ascites about the GI tract. Portion of the small bowel may extend into a ascites and fat containing ventral/umbilical hernia. Vascular/Lymphatic: Vascular abnormalities as described. Diminutive LEFT renal vein as it crosses beneath the SMA possible mild splenorenal shunting and some lumbar collaterals. Patency in direct continuity with the IVC cannot be assessed. Presence of lumbar collateral suggest is a chronic finding and the symmetry of renal enhancement is also supportive of this possibility. Other: Small volume ascites and signs of bowel edema in the setting of portal hypertension. Musculoskeletal: No suspicious bone lesions identified. IMPRESSION: Hepatic hydrothorax. Diminutive, nearly occluded RIGHT portal branches with very diminutive hepatic venous branches tracking over the hypertrophied central RIGHT hepatic lobe and caudate. Potential nonocclusive filling defect in main portal vein versus extrinsic compression. Lenticular area in the pancreas is more likely to be extrinsic, correlate with any recent history of pancreatitis. Area is not well assessed in could also represent a small cystic pancreatic neoplasm but shows no enhancement or gross pancreatic ductal dilation. Signs of advanced liver disease/cirrhosis with  portal hypertension including varices and splenomegaly. Morphologic changes of the liver are more akin to what can be seen in the setting of [place] and are markedly worse than on the study from 2016 in terms of caudate hypertrophy. Would correlate with any primary biliary abnormalities or intervening history of cholangitis though findings remain nonspecific in the setting of chronic liver disease. Marked narrowing of the renal vein as it crosses beneath the SMV. Patency in direct continuity with the IVC cannot be assessed though this is suspected to be a chronic finding given symmetry of renal enhancement and presence of lumbar collaterals. Cholelithiasis with biliary sludge. Additional imaging with CT may be of benefit to further evaluate the portal vein in particular given the motion artifact that is present on the current examination. An additional sequence may be attempted on MRI to further clarify above findings including the renal venous finding. However, if this is not possible or images are inadequate for assessment, CT may ultimately be beneficial to further map vascular structures as warranted. These results will be called to the ordering clinician or representative by the Radiologist Assistant, and communication documented in the PACS or [employer]. Electronically Signed: By: Zetta Bills M.D. On: 02/13/2020 08:45   US Abdomen Limited  Result Date: 02/12/2020 CLINICAL DATA:  Liver fibrosis.  Rule out ascites. EXAM: LIMITED ABDOMEN ULTRASOUND FOR ASCITES TECHNIQUE: Limited ultrasound survey for ascites was performed in all four abdominal quadrants. COMPARISON:  Ultrasound abdomen 12/25/2014 FINDINGS: Small amount ascites in the abdomen. Small ascites in the right upper quadrant and left lower quadrant. Large right pleural effusion. IMPRESSION: Small amount of ascites. Large right pleural effusion. Electronically Signed   By: Franchot Gallo M.D.   On: 02/12/2020 14:39        Scheduled  Meds: . carbidopa-levodopa  1 tablet Oral BID AC  . cyclobenzaprine  5 mg Oral QHS  . enoxaparin (LOVENOX) injection  40 mg Subcutaneous QHS  . feeding supplement  237 mL Oral BID BM  . fentaNYL (SUBLIMAZE) injection  12.5 mcg Intravenous Once  . LORazepam  0.5 mg Intravenous Once  . midodrine  5 mg Oral TID WC  . multivitamin with minerals  1 tablet Oral Daily  . pantoprazole  40 mg Oral BID  . propranolol  20 mg Oral BID   Continuous Infusions:   LOS: 4 days    Time spent: 30 minutes  Darliss Cheney, MD [facility]  To contact the attending provider between 7A-7P or the covering provider during after hours 7P-7A, please log into the web site [email or web address] and access using universal [place] password for that web site. If you do not have the password, please call the hospital operator.  02/14/2020, 2:09 PM

## 2020-02-14 NOTE — Progress Notes (Signed)
SLP Cancellation Note  Patient Details Name: [name] MRN: [id number] DOB: 03-23-1940   Cancelled treatment:       Reason Eval/Treat Not Completed: Other (comment) CT abdomen pending. Will defer Po trials and potential MBS at this time.     Anyia Gierke, Riley Nearing 02/14/2020, 9:35 AM

## 2020-02-14 NOTE — Progress Notes (Signed)
Daily Rounding Note  02/14/2020, 9:09 AM  LOS: 4 days   SUBJECTIVE:   Chief complaint:   Hepatic hydrothorax.   Pt says no difference in breathing after thoracentesis 3 d ago.  No resting SOB currently.  Belly feels slightly bloated.    OBJECTIVE:         Vital signs in last 24 hours:    Temp:  [97.5 F (36.4 C)-98 F (36.7 C)] 97.5 F (36.4 C) (01/06 0631) Pulse Rate:  [77-82] 81 (01/06 0631) Resp:  [18-20] 20 (01/06 0631) BP: (91-107)/(59-69) 99/69 (01/06 0631) SpO2:  [90 %-97 %] 95 % (01/06 0631) Last BM Date: 02/12/20 Filed Weights   02/09/20 0937  Weight: 61.2 kg   General: frail, cachectic, alert, comfortable   Heart: Irreg, a fib rate in 90s on tele.   Chest: wet vocal quality.  BS absent on R, clear on L  Abdomen: soft, NT, active BS  Extremities: no CCE, thin limbs  Neuro/Psych:  Oriented x 3.  Speech difficult to understand.    Intake/Output from previous day: 01/05 0701 - 01/06 0700 In: 237 [P.O.:237] Out: 200 [Urine:200]  Intake/Output this shift: No intake/output data recorded.  Lab Results: Recent Labs    02/12/20 0149 02/13/20 0059 02/14/20 0056  WBC 4.9 8.0 5.4  HGB 11.9* 14.1 12.2*  HCT 33.6* 40.4 35.0*  PLT 122* 153 141*   BMET Recent Labs    02/12/20 0149 02/13/20 0059 02/14/20 0056  NA 129* 129* 126*  K 4.3 4.6 4.1  CL 97* 94* 92*  CO2 23 25 23   GLUCOSE 122* 183* 166*  BUN 34* 30* 33*  CREATININE 1.12 1.26* 1.32*  CALCIUM 8.4* 8.9 8.3*   LFT Recent Labs    02/12/20 0149 02/13/20 0059 02/14/20 0056  PROT 6.4* 7.4 6.2*  ALBUMIN 2.5* 2.9* 2.5*  AST 15 22 16   ALT 10 5 7   ALKPHOS 87 95 102  BILITOT 1.4* 1.6* 1.1   PT/INR Recent Labs    02/13/20 1511  LABPROT 15.5*  INR 1.3*   Hepatitis Panel No results for input(s): HEPBSAG, HCVAB, HEPAIGM, HEPBIGM in the last 72 hours.  Studies/Results: MR ABDOMEN W WO CONTRAST  Addendum Date: 02/13/2020   ADDENDUM  REPORT: 02/13/2020 11:29 ADDENDUM: Question of nonocclusive filling defect in the anterior portal vein on the additional images which were performed to provide some clarification as to portal abnormalities. This could be artifactual and related to slow flow but is seen also on contrasted imaging. For this reason, would consider CT venogram for further assessment to better delineate portal structures as well as portal patency particularly in the RIGHT lobe of the liver. This would also provide additional information regarding the pancreatic/portal interface and possibility of small focal cystic area in the midportion of the pancreas. Would also correlate with lipase to ensure there is no active pancreatic inflammation. These results will be called to the ordering clinician or representative by the Radiologist Assistant, and communication documented in the PACS or 04/12/20. Electronically Signed   By: 04/12/2020 M.D.   On: 02/13/2020 11:29   Result Date: 02/13/2020 CLINICAL DATA:  Liver disease, chronic portal hypertension assessing in consideration for tips procedure. EXAM: MRI ABDOMEN WITHOUT AND WITH CONTRAST TECHNIQUE: Multiplanar multisequence MR imaging of the abdomen was performed both before and after the administration of intravenous contrast. CONTRAST:  58mL GADAVIST GADOBUTROL 1 MMOL/ML IV SOLN COMPARISON:  Prior studies from 2016 and recent  chest CT. FINDINGS: Lower chest: Large RIGHT pleural effusion. Basilar collapse os consolidation. Hepatobiliary: Lobular hepatic contours. Marked hypertrophy of the caudate lobe. Assessment of hepatic parenchyma and in general assessment of abdominal structures is limited due to motion related artifact presence of ascites. The liver shows increased nodularity and parenchymal distortion since the previous study with suggestion of peripheral biliary duct distension as well, in addition to marked caudate hypertrophy No significant fat or iron deposition grossly  accounting for limitations of the exam. Associated splenomegaly. The main portal vein, SMV and splenic veins are grossly patent. There is either extrinsic or intrinsic abnormality of the main portal vein outlined below Either diminutive or occluded RIGHT portal vein, a faint contrast filled structure extends from the portal venous bifurcation towards the RIGHT hepatic lobe perhaps small branch of the RIGHT portal vein. This is best seen on images 67 through 55. Limited visualization of hepatic venous structures, perhaps diminutive hepatic venous structures across the top of the hypertrophied central hepatic parenchyma. Better seen on coronal images where the middle hepatic vein can be visualized perhaps limited visualization also of a diminutive RIGHT hepatic vein. Hepatic cysts are present. Further evaluation of hepatic parenchyma is limited. No extrahepatic biliary duct dilation grossly with signs of cholelithiasis and biliary sludge. Pancreas: Hypointense area along the interface between the pancreas and the splenic portal confluence best seen on images 84 through 87 of series 804. Mixed signal on T2 weighted images in this location best seen on image 26 of series 4 with 1 area showing a lenticular configuration measuring approximately 1.6 x 1.3 cm. Another area of signal variation appearing more linear within or along the main portal vein along the anterior aspect of the main portal vein image 25 of series 4. Spleen: Splenomegaly with 17 cm craniocaudal span. Areas of hypointensity on T2 may reflect sequela of prior splenic infarct. No focal mass lesion. Adrenals/Urinary Tract: Adrenal glands are grossly normal. There is no hydronephrosis. The kidneys enhance symmetrically. Stomach/Bowel: Gastrointestinal tract with signs of edema in the setting of portal hypertension. Small volume ascites about the GI tract. Portion of the small bowel may extend into a ascites and fat containing ventral/umbilical hernia.  Vascular/Lymphatic: Vascular abnormalities as described. Diminutive LEFT renal vein as it crosses beneath the SMA possible mild splenorenal shunting and some lumbar collaterals. Patency in direct continuity with the IVC cannot be assessed. Presence of lumbar collateral suggest is a chronic finding and the symmetry of renal enhancement is also supportive of this possibility. Other: Small volume ascites and signs of bowel edema in the setting of portal hypertension. Musculoskeletal: No suspicious bone lesions identified. IMPRESSION: Hepatic hydrothorax. Diminutive, nearly occluded RIGHT portal branches with very diminutive hepatic venous branches tracking over the hypertrophied central RIGHT hepatic lobe and caudate. Potential nonocclusive filling defect in main portal vein versus extrinsic compression. Lenticular area in the pancreas is more likely to be extrinsic, correlate with any recent history of pancreatitis. Area is not well assessed in could also represent a small cystic pancreatic neoplasm but shows no enhancement or gross pancreatic ductal dilation. Signs of advanced liver disease/cirrhosis with portal hypertension including varices and splenomegaly. Morphologic changes of the liver are more akin to what can be seen in the setting of [place] and are markedly worse than on the study from 2016 in terms of caudate hypertrophy. Would correlate with any primary biliary abnormalities or intervening history of cholangitis though findings remain nonspecific in the setting of chronic liver disease. Marked narrowing of the  renal vein as it crosses beneath the SMV. Patency in direct continuity with the IVC cannot be assessed though this is suspected to be a chronic finding given symmetry of renal enhancement and presence of lumbar collaterals. Cholelithiasis with biliary sludge. Additional imaging with CT may be of benefit to further evaluate the portal vein in particular given the motion artifact that is present on the  current examination. An additional sequence may be attempted on MRI to further clarify above findings including the renal venous finding. However, if this is not possible or images are inadequate for assessment, CT may ultimately be beneficial to further map vascular structures as warranted. These results will be called to the ordering clinician or representative by the Radiologist Assistant, and communication documented in the PACS or [employer]. Electronically Signed: By: Zetta Bills M.D. On: 02/13/2020 08:45   US Abdomen Limited  Result Date: 02/12/2020 CLINICAL DATA:  Liver fibrosis.  Rule out ascites. EXAM: LIMITED ABDOMEN ULTRASOUND FOR ASCITES TECHNIQUE: Limited ultrasound survey for ascites was performed in all four abdominal quadrants. COMPARISON:  Ultrasound abdomen 12/25/2014 FINDINGS: Small amount ascites in the abdomen. Small ascites in the right upper quadrant and left lower quadrant. Large right pleural effusion. IMPRESSION: Small amount of ascites. Large right pleural effusion. Electronically Signed   By: Franchot Gallo M.D.   On: 02/12/2020 14:39    ASSESMENT:   *   Pleural effusion.  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   Hx ascites, tapped x 2 in 12/2018.  Small volume abd ascites per MRI Fluid re-accumulated rapidly after thoracentesis 02/11/20.  Last CXR was 1/4.      *   Borderline renal fx and hyponatremia.  Increased doses diuretics to aldactone 150/lasix 60 today 02/13/20  w caution .  TID midodrine added 1/4 by attending "hopefully gives [place] more room for diuresis".   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  MRI reveals advanced/progressive cirrhosis with portal hypertension, varices.  Diminutive, nearly occluded right portal branches and very diminutive hepatic venous branches in central right hepatic lobe and caudate.  *    Potential nonocclusive filling defect of main PV versus extrinsic compression. 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    *   Renal artery narrowing. AKI, mild.  This mornings slight worsening does not yet reflect the larger doses of diuretics.    *    Uncomplicated cholelithiasis and biliary sludge.  *   Abnormal pancreas imaging per MRI.    *   Dysphagia in pt w parkinson's.  on D2/nectar diet.    *   Previous banding of esoph varices, last in 07/2019.  On Inderal.    *   Hyponatremia.    *   Splenomegaly. ?  Splenic hypointensity signal on MRI,  ?sequela of previous splenic infarct  *   Bilateral inguinal and umbilical hernias.    PLAN   *  CTAP w venogram. Needs to be NPO for 4 hours for this and he is currently eating his BF so study will be this afternoon.   Orders re npo placed.    *   Called echo dept and cardiology card master to ask about getting echo read, they will take care of this.      Azucena Freed  02/14/2020, 9:09 AM Phone [phone]

## 2020-02-14 NOTE — Progress Notes (Signed)
Nutrition Follow-up  DOCUMENTATION CODES:   Underweight,Severe malnutrition in context of chronic illness  INTERVENTION:   -Continue Ensure Enlive po BID, each supplement provides 350 kcal and 20 grams of protein -Continue with minerals daily -Continue Magic cup TID with meals, each supplement provides 290 kcal and 9 grams of protein  NUTRITION DIAGNOSIS:   Severe Malnutrition related to chronic illness (cirrhosis) as evidenced by severe fat depletion,severe muscle depletion,percent weight loss.  Ongoing  GOAL:   Patient will meet greater than or equal to 90% of their needs  Progressing   MONITOR:   Diet advancement,PO intake,Supplement acceptance,Labs,Skin,I & O's,Weight trends  REASON FOR ASSESSMENT:   Consult Assessment of nutrition requirement/status  ASSESSMENT:   [name] is a 80 y.o. male with a pertinent history of   parkinsons GERD and history of esophageal stricture, cirrhosis of liver with portal hypertension and esophageal varices and thrombocytopenia, OSA on CPAP,, A. fib, aortic valve disorder, anemia, BPH, CAD, history of colon cancer status post chemotherapy which he thinks caused cirrhosis.  1/3- s/p BSE- downgraded diet dysphagia 2 diet with nectar thick liquids; s/p thoracentesis 1/5- MRI of abdomen reveals advanced liver disease, cirrhosis, portal hypertension, intra-abdominal varices, and splenomegaly; difficult to assess portal venous system   Reviewed I/O's: +37 ml x 24 hours and -3 L since admission  UOP: 200 ml x 24 hours  Per GI notes, plan for CTAP with venogram today; pt will need to be NPO for 4 hours prior to procedure.   Pt unavailable at time of attempted contact.   Pt with improved oral intake. Noted meal completion 75-100%. Pt is consuming Ensure supplements per MAR.   Per SLP, deferring MBSS to later date.  Medications reviewed and include lasix, sinemet, and aldactone.  Labs reviewed: Na: 126.   Diet Order:   Diet  Order            Diet NPO time specified  Diet effective now                 EDUCATION NEEDS:   Education needs have been addressed  Skin:  Skin Assessment: Skin Integrity Issues: Skin Integrity Issues:: Stage I Stage I: bilateral buttocks  Last BM:  02/12/20  Height:   Ht Readings from Last 1 Encounters:  02/09/20 6' (1.829 m)    Weight:   Wt Readings from Last 1 Encounters:  02/09/20 61.2 kg    Ideal Body Weight:  80.9 kg  BMI:  Body mass index is 18.31 kg/m.  Estimated Nutritional Needs:   Kcal:  3149-7026  Protein:  125-150 grams  Fluid:  > 2 L    Levada Schilling, RD, LDN, CDCES Registered Dietitian II Certified Diabetes Care and Education Specialist Please refer to [facility] for RD and/or RD on-call/weekend/after hours pager

## 2020-02-15 ENCOUNTER — Inpatient Hospital Stay (HOSPITAL_COMMUNITY): Payer: Medicare Other

## 2020-02-15 DIAGNOSIS — J90 Pleural effusion, not elsewhere classified: Secondary | ICD-10-CM | POA: Diagnosis not present

## 2020-02-15 LAB — BASIC METABOLIC PANEL
Anion gap: 12 (ref 5–15)
BUN: 33 mg/dL — ABNORMAL HIGH (ref 8–23)
CO2: 23 mmol/L (ref 22–32)
Calcium: 9 mg/dL (ref 8.9–10.3)
Chloride: 94 mmol/L — ABNORMAL LOW (ref 98–111)
Creatinine, Ser: 1.46 mg/dL — ABNORMAL HIGH (ref 0.61–1.24)
GFR, Estimated: 49 mL/min — ABNORMAL LOW (ref >60)
Glucose, Bld: 168 mg/dL — ABNORMAL HIGH (ref 70–99)
Potassium: 4.5 mmol/L (ref 3.5–5.1)
Sodium: 129 mmol/L — ABNORMAL LOW (ref 135–145)

## 2020-02-15 LAB — CBC WITH DIFFERENTIAL/PLATELET
Abs Immature Granulocytes: 0.03 10*3/uL (ref 0.00–0.07)
Basophils Absolute: 0 10*3/uL (ref 0.0–0.1)
Basophils Relative: 0 %
Eosinophils Absolute: 0 10*3/uL (ref 0.0–0.5)
Eosinophils Relative: 0 %
HCT: 38.6 % — ABNORMAL LOW (ref 39.0–52.0)
Hemoglobin: 13.8 g/dL (ref 13.0–17.0)
Immature Granulocytes: 0 %
Lymphocytes Relative: 10 %
Lymphs Abs: 0.8 10*3/uL (ref 0.7–4.0)
MCH: 31.2 pg (ref 26.0–34.0)
MCHC: 35.8 g/dL (ref 30.0–36.0)
MCV: 87.3 fL (ref 80.0–100.0)
Monocytes Absolute: 0.6 10*3/uL (ref 0.1–1.0)
Monocytes Relative: 8 %
Neutro Abs: 6.1 10*3/uL (ref 1.7–7.7)
Neutrophils Relative %: 82 %
Platelets: 172 10*3/uL (ref 150–400)
RBC: 4.42 MIL/uL (ref 4.22–5.81)
RDW: 13.6 % (ref 11.5–15.5)
WBC: 7.6 10*3/uL (ref 4.0–10.5)
nRBC: 0 % (ref 0.0–0.2)

## 2020-02-15 LAB — ECHOCARDIOGRAM COMPLETE
Height: 72 in
S' Lateral: 3.3 cm
Weight: 2160 oz

## 2020-02-15 LAB — PATHOLOGIST SMEAR REVIEW

## 2020-02-15 MED ORDER — FUROSEMIDE 40 MG PO TABS
40.0000 mg | ORAL_TABLET | Freq: Every day | ORAL | Status: DC
  Administered 2020-02-16: 40 mg via ORAL
  Filled 2020-02-15: qty 1

## 2020-02-15 MED ORDER — FUROSEMIDE 20 MG PO TABS
20.0000 mg | ORAL_TABLET | Freq: Every day | ORAL | Status: DC
  Administered 2020-02-15: 20 mg via ORAL
  Filled 2020-02-15: qty 1

## 2020-02-15 MED ORDER — SPIRONOLACTONE 25 MG PO TABS
50.0000 mg | ORAL_TABLET | Freq: Every day | ORAL | Status: DC
  Administered 2020-02-15: 50 mg via ORAL
  Filled 2020-02-15: qty 2

## 2020-02-15 MED ORDER — SPIRONOLACTONE 100 MG PO TABS
100.0000 mg | ORAL_TABLET | Freq: Every day | ORAL | Status: DC
  Administered 2020-02-16: 100 mg via ORAL
  Filled 2020-02-15: qty 1

## 2020-02-15 MED ORDER — TRAZODONE HCL 50 MG PO TABS
50.0000 mg | ORAL_TABLET | Freq: Once | ORAL | Status: AC
  Administered 2020-02-15: 50 mg via ORAL
  Filled 2020-02-15: qty 1

## 2020-02-15 NOTE — Care Management Important Message (Signed)
Important Message  Patient Details  Name: [name] MRN: [id number] Date of Birth: 04-08-1940   Medicare Important Message Given:  Yes     Orbie Pyo 02/15/2020, 1:56 PM

## 2020-02-15 NOTE — Progress Notes (Signed)
Physical Therapy Treatment Patient Details Name: [name] MRN: [id number] DOB: [birth year] Today's Date: 02/15/2020    History of Present Illness 80 y.o. male admitted with chest tightness and difficulty breathing.  PMH pertinent for parkinsons GERD and history of esophageal stricture, cirrhosis of liver with portal hypertension and esophageal varices and thrombocytopenia, OSA on CPAP,, A. fib, aortic valve disorder, anemia, BPH, CAD, history of colon cancer status post chemotherapy which he thinks caused cirrhosis. Workup reveals pleural effusion.    PT Comments    Patient received up in recliner. Agrees to PT session. States he did not sleep much last night. Ambulated 200 feet with RW and min guard. No overt LOB. Slight sob reported after ambulating. Transfers with supervision. Performed sit to supine with mod independence. Seated and standing exercises performed with mild SOB after standing exercises. Patient will continue to benefit from skilled PT while here to improve activity tolerance and strength.      Follow Up Recommendations  No PT follow up     Equipment Recommendations  Rolling walker with 5" wheels    Recommendations for Other Services       Precautions / Restrictions Precautions Precautions: Fall Precaution Comments: mod fall Restrictions Weight Bearing Restrictions: No    Mobility  Bed Mobility Overal bed mobility: Modified Independent Bed Mobility: Sit to Supine       Sit to supine: Modified independent (Device/Increase time)   General bed mobility comments: patient received in recliner, but returned to bed to try to sleep.  Transfers Overall transfer level: Needs assistance Equipment used: Rolling walker (2 wheeled) Transfers: Sit to/from Stand Sit to Stand: Supervision;Min guard         General transfer comment: min guard for safety  Ambulation/Gait Ambulation/Gait assistance: Min guard Gait Distance (Feet): 200 Feet Assistive device:  Rolling walker (2 wheeled) Gait Pattern/deviations: Step-through pattern Gait velocity: decreased   General Gait Details: patient reports mild sob with ambulation   Stairs             Wheelchair Mobility    Modified Rankin (Stroke Patients Only)       Balance Overall balance assessment: Mild deficits observed, not formally tested Sitting-balance support: Feet supported Sitting balance-Leahy Scale: Good     Standing balance support: Bilateral upper extremity supported;During functional activity Standing balance-Leahy Scale: Good                              Cognition Arousal/Alertness: Awake/alert Behavior During Therapy: WFL for tasks assessed/performed Overall Cognitive Status: Within Functional Limits for tasks assessed                                 General Comments: HoH (hearing aide in L ear)      Exercises Other Exercises Other Exercises: Seated LE exercises: AP, heel slides, hip abd/add, LAQ x 10 B. Standing: hip abd, marching, mini squats, hip ext. x 10 reps B.    General Comments        Pertinent Vitals/Pain Faces Pain Scale: No hurt Pain Location: tightness in abdomen area Pain Descriptors / Indicators: Tightness Pain Intervention(s): Monitored during session    Home Living                      Prior Function            PT Goals (current goals  can now be found in the care plan section) Acute Rehab PT Goals Patient Stated Goal: breathe better PT Goal Formulation: With patient Time For Goal Achievement: 02/23/20 Potential to Achieve Goals: Good Progress towards PT goals: Progressing toward goals    Frequency    Min 3X/week      PT Plan Current plan remains appropriate    Co-evaluation              AM-PAC PT "6 Clicks" Mobility   Outcome Measure  Help needed turning from your back to your side while in a flat bed without using bedrails?: None Help needed moving from lying on your back  to sitting on the side of a flat bed without using bedrails?: None Help needed moving to and from a bed to a chair (including a wheelchair)?: A Little Help needed standing up from a chair using your arms (e.g., wheelchair or bedside chair)?: A Little Help needed to walk in hospital room?: A Little Help needed climbing 3-5 steps with a railing? : A Little 6 Click Score: 20    End of Session Equipment Utilized During Treatment: Gait belt Activity Tolerance: Patient tolerated treatment well Patient left: in bed;with call bell/phone within reach;with bed alarm set Nurse Communication: Mobility status PT Visit Diagnosis: Muscle weakness (generalized) (M62.81);Unsteadiness on feet (R26.81)     Time: 0086-7619 PT Time Calculation (min) (ACUTE ONLY): 28 min  Charges:  $Gait Training: 8-22 mins $Therapeutic Exercise: 8-22 mins                     Maddie Brazier, PT, GCS 02/15/20,10:14 AM

## 2020-02-15 NOTE — Progress Notes (Signed)
PROGRESS NOTE    [name]  [id number] DOB: Jun 03, 1940 DOA: 02/09/2020 PCP: Burnard Bunting, MD  Chief Complaint  Patient presents with  . Chest Pain  . Shortness of Breath   Brief Narrative:  [name] is a 80 y.o. male with a pertinent history of  parkinsons GERD and history of esophageal stricture, cirrhosis of liver with portal hypertension and esophageal varices and thrombocytopenia, OSA on CPAP,, A. fib, aortic valve disorder, anemia, BPH, CAD, history of colon cancer status post chemotherapy which he thinks caused cirrhosis.  He states he has noticed Right lower chest pain, pleuriticin nature and he can't get a good breath.  He states they really noticed it yesterday after patient was eating [place] and a hamburger and had an aspiration event of hamburger they think.  He denies any fevers or chills or sick contacts but since then.  We thought this could have been coincidental as it takes something for someone to notice it.  Daughter helps corroborate the story.  In the emergency department 118/73, 98.1, HR 95, 95% on room air, RR 18.  WBC 8.9, Hgb 12.8, PLT 149, NA 130, K4.5, CL 93, CO2 26, SCR 1.40, glucose 150  CTa IMPRESSION: 1. No definite evidence of pulmonary embolus. 2. Large right pleural effusion is noted which results in right to left mediastinal shift and complete atelectasis of the right lower lobe. Partial atelectasis of the right upper and middle lobes is noted. Minimal left pleural effusion is noted with adjacent subsegmental atelectasis. No abx were given.  SLP previously showed mild aspiration risk.  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  He had thoracentesis 1/3, but had quick reaccumulation of fluid on repeat CXR on 1/4.  Plan for GI consult, sodium restriction, diuresis.  May need repeat thoracentesis.  May need discussion of TIPS with IR.      Assessment & Plan:   Active  Problems:   Pleural effusion on right   Pleural effusion   Pressure injury of skin   Protein-calorie malnutrition, severe   Abnormal MRI, liver  Right Sided Pleural Effusion CT with large R pleural effusion  pulm consult, appreciate assistance S/p thoracentesis by pulm 1/3 -> 1400 cc straw colored fluid.  Consistent with transudative. 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  They have obtained MRI abdomen.  Now echo and CT venogram is pending.  Appreciate GI help for managing this case. Follow culture (NG <24 hrs).   Follow cytology. Lower suspicion for infection at this time, continue to monitor   Cirrhosis with hx ascites US abdomen limited with small amount of ascites, large right effusion GI consult as noted above, appreciate recs.  Continue propranolol. GI had discontinued both Lasix and Aldactone due to drop in sodium to 126 however there were so many erroneous reports on sodium level yesterday at our campus so I repeated his BMP and his sodium was much better and at his baseline. I relayed my concerns to Dr. Billie Lade as well but did not hear back from him.His sodium is 129 which is very close to his baseline. His blood pressure is fair enough to  tolerate diuretics so I am going to resume him on low-dose of Lasix and Aldactone for now and will wait for GI to comment on that. GI is working him up for possible TIPS procedure.  AKI  NAGMA: improving, follow with diuretics  Hyponatremia: 129 today.  Parkinsons Continue sinemet Holding adderall for now  Hx colon cancer S/p colectomy and chemotherapy Follows with Dr. Benay Spice -  in clinical remission   Anemia  Thrombocytopenia: continue to monitor   Prediabetes: Hemoglobin A1c 6.0.  Muscle Spasms: Continue 10 mg of Flexeril. Patient is feeling better.  Body mass index is 18.31 kg/m.  DVT prophylaxis: heparin Code Status: full  Family Communication: none at bedside Disposition:   Status is: Inpatient  Remains inpatient appropriate because:Inpatient level of care appropriate due to severity of illness   Dispo: The patient is from: Home              Anticipated d/c is to: pending              Anticipated d/c date is: > 3 days              Patient currently is not medically stable to d/c.  Consultants:   pulm   Procedures:  none  Antimicrobials:  Anti-infectives (From admission, onward)   None         Subjective: Seen and examined. No complaints. Denied any shortness of breath.  Objective: Vitals:   02/14/20 0631 02/14/20 1354 02/14/20 2132 02/15/20 0440  BP: 99/69 (!) 104/58 (!) 112/92 110/80  Pulse: 81 87 93 92  Resp: 20 15 17 18   Temp: (!) 97.5 F (36.4 C) 98.5 F (36.9 C) 97.8 F (36.6 C) 98.2 F (36.8 C)  TempSrc: Oral Oral    SpO2: 95% 95% 96% 97%  Weight:      Height:       No intake or output data in the 24 hours ending 02/15/20 1352 Filed Weights   02/09/20 0937  Weight: 61.2 kg    Examination:  General exam: Appears calm and comfortable  Respiratory system: Diminished breath sounds in the right middle and lower lobe. Respiratory effort normal. Cardiovascular system: S1 & S2 heard, RRR. No JVD, murmurs, rubs, gallops or clicks. No pedal edema. Gastrointestinal system: Abdomen is nondistended, soft and nontender. No organomegaly or masses felt. Normal bowel sounds heard. Central nervous system: Alert and oriented. No focal neurological deficits. Extremities: Symmetric 5 x 5 power. Skin: No rashes, lesions or ulcers.  Psychiatry: Judgement and insight appear normal. Mood & affect appropriate.   Data Reviewed: I  have personally reviewed following labs and imaging studies  CBC: Recent Labs  Lab 02/10/20 1014 02/11/20 0238 02/12/20 0149 02/13/20 0059 02/14/20 0056 02/15/20 0911  WBC 5.4 5.5 4.9 8.0 5.4 7.6  NEUTROABS 4.3 4.4 3.8  --  3.9 6.1  HGB 12.4* 12.5* 11.9* 14.1 12.2* 13.8  HCT 33.7* 33.6* 33.6* 40.4 35.0* 38.6*  MCV 86.9 85.7 86.4 87.1 87.5 87.3  PLT 123* 161 122* 153 141* [id number]    Basic Metabolic Panel: Recent Labs  Lab 02/11/20 0238 02/12/20 0149 02/13/20 0059 02/14/20 0056 02/14/20 1522 02/15/20 0911  NA 131* 129* 129* 126* 132* 129*  K 4.8 4.3 4.6 4.1 3.9 4.5  CL 94* 97* 94* 92* 95* 94*  CO2 25 23 25 23 25 23   GLUCOSE 126* 122* 183* 166* 145* 168*  BUN 35* 34* 30* 33* 31* 33*  CREATININE 1.37* 1.12 1.26* 1.32* 1.31* 1.46*  CALCIUM  9.0 8.4* 8.9 8.3* 9.0 9.0  MG 1.9 1.9  --   --   --   --   PHOS 3.7 2.8  --   --   --   --     GFR: Estimated Creatinine Clearance: 36.1 mL/min (A) (by C-G formula based on SCr of 1.46 mg/dL (H)).  Liver Function Tests: Recent Labs  Lab 02/10/20 1014 02/11/20 0238 02/11/20 1058 02/12/20 0149 02/13/20 0059 02/14/20 0056  AST 20 29  --  15 22 16   ALT 21 13  --  10 5 7   ALKPHOS 83 84  --  87 95 102  BILITOT 2.8* 2.1*  --  1.4* 1.6* 1.1  PROT 6.4* 6.5  --  6.4* 7.4 6.2*  ALBUMIN 2.6* 2.6* 2.9* 2.5* 2.9* 2.5*    CBG: No results for input(s): GLUCAP in the last 168 hours.   Recent Results (from the past 240 hour(s))  Resp Panel by RT-PCR (Flu A&B, Covid) Nasopharyngeal Swab     Status: None   Collection Time: 02/09/20  5:28 PM   Specimen: Nasopharyngeal Swab; Nasopharyngeal(NP) swabs in vial transport medium  Result Value Ref Range Status   SARS Coronavirus 2 by RT PCR NEGATIVE NEGATIVE Final    Comment: (NOTE) SARS-CoV-2 target nucleic acids are NOT DETECTED.  The SARS-CoV-2 RNA is generally detectable in upper respiratory specimens during the acute phase of infection. The lowest concentration of SARS-CoV-2 viral copies  this assay can detect is 138 copies/mL. A negative result does not preclude SARS-Cov-2 infection and should not be used as the sole basis for treatment or other patient management decisions. A negative result may occur with  improper specimen collection/handling, submission of specimen other than nasopharyngeal swab, presence of viral mutation(s) within the areas targeted by this assay, and inadequate number of viral copies(<138 copies/mL). A negative result must be combined with clinical observations, patient history, and epidemiological information. The expected result is Negative.  Fact Sheet for Patients:  EntrepreneurPulse.com.au  Fact Sheet for Healthcare Providers:  IncredibleEmployment.be  This test is no t yet approved or cleared by the [place] FDA and  has been authorized for detection and/or diagnosis of SARS-CoV-2 by FDA under an Emergency Use Authorization (EUA). This EUA will remain  in effect (meaning this test can be used) for the duration of the COVID-19 declaration under Section 564(b)(1) of the Act, 21 U.S.C.section 360bbb-3(b)(1), unless the authorization is terminated  or revoked sooner.       Influenza A by PCR NEGATIVE NEGATIVE Final   Influenza B by PCR NEGATIVE NEGATIVE Final    Comment: (NOTE) The Xpert Xpress SARS-CoV-2/FLU/RSV plus assay is intended as an aid in the diagnosis of influenza from Nasopharyngeal swab specimens and should not be used as a sole basis for treatment. Nasal washings and aspirates are unacceptable for Xpert Xpress SARS-CoV-2/FLU/RSV testing.  Fact Sheet for Patients: EntrepreneurPulse.com.au  Fact Sheet for Healthcare Providers: IncredibleEmployment.be  This test is not yet approved or cleared by the [place] FDA and has been authorized for detection and/or diagnosis of SARS-CoV-2 by FDA under an Emergency Use Authorization (EUA). This EUA  will remain in effect (meaning this test can be used) for the duration of the COVID-19 declaration under Section 564(b)(1) of the Act, 21 U.S.C. section 360bbb-3(b)(1), unless the authorization is terminated or revoked.  Performed at [facility], [place] [address] [place], [place] [address]   Pleural Fluid culture (includes gram stain)     Status: None  Collection Time: 02/11/20  2:18 PM   Specimen: Pleural Fluid  Result Value Ref Range Status   Specimen Description PLEURAL FLUID  Final   Special Requests NONE  Final   Gram Stain   Final    WBC PRESENT,BOTH PMN AND MONONUCLEAR NO ORGANISMS SEEN CYTOSPIN SMEAR    Culture   Final    NO GROWTH Performed at [facility], [address] [place], [place] [address]    Report Status 02/14/2020 FINAL  Final         Radiology Studies: DG CHEST PORT 1 VIEW  Result Date: 02/15/2020 CLINICAL DATA:  Hydrothorax EXAM: PORTABLE CHEST 1 VIEW COMPARISON:  02/12/2020 FINDINGS: Improved but still very large right pleural effusion obscuring most of the right lung. Based on rotation it is difficult to detect if there is mediastinal shift either direction. Clear left lung. IMPRESSION: Improved but still very large right pleural effusion. Electronically Signed   By: Monte Fantasia M.D.   On: 02/15/2020 06:36   ECHOCARDIOGRAM COMPLETE  Result Date: 02/15/2020    ECHOCARDIOGRAM REPORT   Patient Name:   CHRISTOPHERJOSE JANOSIK Date of Exam: 02/13/2020 Medical Rec #:  [id number]       Height:       72.0 in Accession #:    [id number]      Weight:       135.0 lb Date of Birth:  05/17/1940       BSA:          1.803 m Patient Age:    56 years        BP:           107/62 mmHg Patient Gender: M               HR:           83 bpm. Exam Location:  Inpatient Procedure: 2D Echo Indications:    pleural effusion  History:        Patient has no prior history of Echocardiogram examinations.                 CAD, Cirrhosis, Parkinson's,, Arrythmias:Atrial Fibrillation;                  Risk Factors:Sleep Apnea.  Sonographer:    Johny Chess Referring Phys: [phone] A CALDWELL POWELL [place]  1. Very mild distal septal hypokinesis . Left ventricular ejection fraction, by estimation, is 50 to 55%. The left ventricle has low normal function. Left ventricular diastolic parameters are indeterminate.  2. Right ventricular systolic function is low normal. The right ventricular size is normal. There is normal pulmonary artery systolic pressure.  3. Large R pleural effusion causes some indentation of RA. A trivial pericardial effusion is present.  4. Mild to moderate mitral valve regurgitation.  5. Tricuspid valve regurgitation is moderate.  6. The aortic valve is abnormal. Aortic valve regurgitation is mild. Mild to moderate aortic valve sclerosis/calcification is present, without any evidence of aortic stenosis.  7. The inferior vena cava is normal in size with greater than 50% respiratory variability, suggesting right atrial pressure of 3 mmHg. FINDINGS  Left Ventricle: Very mild distal septal hypokinesis. Left ventricular ejection fraction, by estimation, is 50 to 55%. The left ventricle has low normal function. The left ventricular internal cavity size was normal in size. There is no left ventricular hypertrophy. Left ventricular diastolic parameters are indeterminate. Right Ventricle: The right ventricular size is normal. Right vetricular wall thickness was  not assessed. Right ventricular systolic function is low normal. There is normal pulmonary artery systolic pressure. The tricuspid regurgitant velocity is 2.36 m/s, and with an assumed right atrial pressure of 3 mmHg, the estimated right ventricular systolic pressure is 72.0 mmHg. Left Atrium: Left atrial size was normal in size. Right Atrium: Right atrial size was normal in size. Pericardium: Large R pleural effusion causes some indentation of RA. A small pericardial effusion is present. Mitral Valve: The mitral valve is  abnormal. There is mild thickening of the mitral valve leaflet(s). There is mild calcification of the mitral valve leaflet(s). Mild mitral annular calcification. Mild to moderate mitral valve regurgitation. Tricuspid Valve: The tricuspid valve is normal in structure. Tricuspid valve regurgitation is moderate. Aortic Valve: The aortic valve is abnormal. Aortic valve regurgitation is mild. Mild to moderate aortic valve sclerosis/calcification is present, without any evidence of aortic stenosis. Pulmonic Valve: The pulmonic valve was grossly normal. Pulmonic valve regurgitation is trivial. Aorta: The aortic root is normal in size and structure. Venous: The inferior vena cava is normal in size with greater than 50% respiratory variability, suggesting right atrial pressure of 3 mmHg. IAS/Shunts: The interatrial septum was not assessed. Additional Comments: There is a large pleural effusion in the right lateral region.  LEFT VENTRICLE PLAX 2D LVIDd:         4.00 cm LVIDs:         3.30 cm LV PW:         1.00 cm LV IVS:        0.60 cm LVOT diam:     2.30 cm LVOT Area:     4.15 cm  RIGHT VENTRICLE             IVC RV S prime:     14.80 cm/s  IVC diam: 0.90 cm TAPSE (M-mode): 1.5 cm LEFT ATRIUM             Index       RIGHT ATRIUM           Index LA diam:        3.60 cm 2.00 cm/m  RA Area:     10.90 cm LA Vol (A2C):   57.3 ml 31.78 ml/m RA Volume:   22.60 ml  12.54 ml/m LA Vol (A4C):   54.5 ml 30.23 ml/m LA Biplane Vol: 56.2 ml 31.17 ml/m   AORTA Ao Root diam: 3.85 cm Ao Asc diam:  3.40 cm TRICUSPID VALVE TR Peak grad:   22.3 mmHg TR Vmax:        236.00 cm/s  SHUNTS Systemic Diam: 2.30 cm Dorris Carnes MD Electronically signed by Dorris Carnes MD Signature Date/Time: 02/15/2020/12:41:15 PM    Final    CT VENOGRAM ABD/PEL  Result Date: 02/15/2020 CLINICAL DATA:  Possible thrombus in the portal vein on recent MRI. Cirrhosis. Portal hypertension. EXAM: CT ABDOMEN AND PELVIS WITH CONTRAST TECHNIQUE: Multidetector CT imaging of  the abdomen and pelvis was performed using the standard protocol following bolus administration of intravenous contrast. CONTRAST:  178mL OMNIPAQUE IOHEXOL 350 MG/ML SOLN COMPARISON:  02/12/2020 MRI abdomen. 11/01/2014 CT abdomen/pelvis. FINDINGS: Lower chest: Large right pleural effusion with right lung base atelectasis. Moderate lower esophageal varices. Right coronary atherosclerosis. Hepatobiliary: Prominent atrophy of the right liver lobe with prominent hypertrophy of the central liver and caudate lobe of the liver with lobulated outer liver contour, compatible with cirrhosis. Several simple liver cysts scattered throughout the liver, largest 3.5 cm inferiorly in the right liver. Several subcentimeter  hypodense liver lesions scattered throughout the liver, too small to characterize, compatible with benign liver cysts as seen on MRI from 02/12/2020. No suspicious liver masses. Cholelithiasis. No definite gallbladder wall thickening. No gallbladder distention. No biliary ductal dilatation. CBD diameter 3 mm. Pancreas: Normal, with no mass or duct dilation. Spleen: Mild-to-moderate splenomegaly. Craniocaudal splenic length 15.8 cm. No splenic mass. Adrenals/Urinary Tract: Normal adrenals. No hydronephrosis. Subcentimeter hypodense inferior left renal lesion, too small to characterize, unchanged, considered benign. No suspicious renal masses. Chronic mild diffuse bladder wall thickening. Stomach/Bowel: Normal non-distended stomach. Postsurgical changes from subtotal right hemicolectomy with ileocolic anastomosis in the right abdomen. No dilated or thick-walled small bowel loops. Mild sigmoid diverticulosis with no definite large bowel wall thickening. Vascular/Lymphatic: Atherosclerotic nonaneurysmal abdominal aorta. 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 Chronic linear thrombus at main portal vein bifurcation (series 3/image 24).  Asymmetrically diminutive right portal vein branches. Chronic nonocclusive thrombus in the anterior aspect of the proximal splenic vein (series 3/image 29). Patent diminutive hepatic veins. Patent renal veins with chronic narrowing of the left renal vein between the SMA and aorta. Small to moderate paraumbilical varices. No pathologically enlarged lymph nodes in the abdomen or pelvis. Reproductive: Mild prostatomegaly. Other: No pneumoperitoneum. Small volume ascites, most prominent in the pelvis. No focal fluid collection. Mild anasarca. Musculoskeletal: No aggressive appearing focal osseous lesions. Mild thoracolumbar spondylosis. IMPRESSION: 1. Cirrhosis. No suspicious liver masses. 2. 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 No acute vascular abnormality. 3. Small volume ascites. Mild anasarca. 4. Mild-to-moderate splenomegaly. Moderate lower esophageal and paraumbilical varices. 5. Large right pleural effusion. 6. Cholelithiasis.  No biliary ductal dilatation. 7. Mild sigmoid diverticulosis. 8. Chronic mild diffuse bladder wall thickening, probably due to chronic bladder outlet obstruction by the mildly enlarged prostate. 9. Aortic Atherosclerosis (ICD10-I70.0). Electronically Signed   By: Ilona Sorrel M.D.   On: 02/15/2020 08:40        Scheduled Meds: . carbidopa-levodopa  1 tablet Oral BID AC  . cyclobenzaprine  10 mg Oral QHS  . enoxaparin (LOVENOX) injection  40 mg Subcutaneous QHS  . feeding supplement  237 mL Oral BID BM  . fentaNYL (SUBLIMAZE) injection  12.5 mcg Intravenous Once  . LORazepam  0.5 mg Intravenous Once  . midodrine  5 mg Oral TID WC  . multivitamin with minerals  1 tablet Oral Daily  . pantoprazole  40 mg Oral BID  . propranolol  20 mg Oral BID   Continuous Infusions:   LOS: 5 days    Time spent: 29 minutes  Darliss Cheney, MD [facility]  To contact the attending provider between 7A-7P  or the covering provider during after hours 7P-7A, please log into the web site [email or web address] and access using universal [place] password for that web site. If you do not have the password, please call the hospital operator.  02/15/2020, 1:52 PM

## 2020-02-15 NOTE — Progress Notes (Addendum)
Daily Rounding Note  02/15/2020, 9:43 AM  LOS: 5 days   SUBJECTIVE:   Chief complaint:   Hepatic hydrothorax  Patient does not have much complaint of dyspnea. Still no report on the Echo of 2 d ago.   BPs generally low.  Currently low 100s/50s -90s.  OBJECTIVE:         Vital signs in last 24 hours:    Temp:  [97.8 F (36.6 C)-98.5 F (36.9 C)] 98.2 F (36.8 C) (01/07 0440) Pulse Rate:  [87-93] 92 (01/07 0440) Resp:  [15-18] 18 (01/07 0440) BP: (104-112)/(58-92) 110/80 (01/07 0440) SpO2:  [95 %-97 %] 97 % (01/07 0440) Last BM Date: 02/14/20 Filed Weights   02/09/20 0937  Weight: 61.2 kg   General: Chronically ill, skeletal, frail Heart: Irregularly irregular Chest: Essentially absent breath sounds on the right but clear bilaterally.  No labored breathing at rest Abdomen: Nontender, nondistended.  Umbilical and inguinal hernias Extremities: No CCE Neuro/Psych: Oriented x3.  Tremors in upper extremity and head  Intake/Output from previous day: No intake/output data recorded.  Intake/Output this shift: No intake/output data recorded.  Lab Results: Recent Labs    02/13/20 0059 02/14/20 0056  WBC 8.0 5.4  HGB 14.1 12.2*  HCT 40.4 35.0*  PLT 153 141*   BMET Recent Labs    02/13/20 0059 02/14/20 0056 02/14/20 1522  NA 129* 126* 132*  K 4.6 4.1 3.9  CL 94* 92* 95*  CO2 25 23 25   GLUCOSE 183* 166* 145*  BUN 30* 33* 31*  CREATININE 1.26* 1.32* 1.31*  CALCIUM 8.9 8.3* 9.0   LFT Recent Labs    02/13/20 0059 02/14/20 0056  PROT 7.4 6.2*  ALBUMIN 2.9* 2.5*  AST 22 16  ALT 5 7  ALKPHOS 95 102  BILITOT 1.6* 1.1   PT/INR Recent Labs    02/13/20 1511  LABPROT 15.5*  INR 1.3*   Hepatitis Panel No results for input(s): HEPBSAG, HCVAB, HEPAIGM, HEPBIGM in the last 72 hours.  Studies/Results: DG CHEST PORT 1 VIEW  Result Date: 02/15/2020 CLINICAL DATA:  Hydrothorax EXAM: PORTABLE CHEST 1  VIEW COMPARISON:  02/12/2020 FINDINGS: Improved but still very large right pleural effusion obscuring most of the right lung. Based on rotation it is difficult to detect if there is mediastinal shift either direction. Clear left lung. IMPRESSION: Improved but still very large right pleural effusion. Electronically Signed   By: Monte Fantasia M.D.   On: 02/15/2020 06:36   CT VENOGRAM ABD/PEL  Result Date: 02/15/2020 CLINICAL DATA:  Possible thrombus in the portal vein on recent MRI. Cirrhosis. Portal hypertension. EXAM: CT ABDOMEN AND PELVIS WITH CONTRAST TECHNIQUE: Multidetector CT imaging of the abdomen and pelvis was performed using the standard protocol following bolus administration of intravenous contrast. CONTRAST:  172mL OMNIPAQUE IOHEXOL 350 MG/ML SOLN COMPARISON:  02/12/2020 MRI abdomen. 11/01/2014 CT abdomen/pelvis. FINDINGS: Lower chest: Large right pleural effusion with right lung base atelectasis. Moderate lower esophageal varices. Right coronary atherosclerosis. Hepatobiliary: Prominent atrophy of the right liver lobe with prominent hypertrophy of the central liver and caudate lobe of the liver with lobulated outer liver contour, compatible with cirrhosis. Several simple liver cysts scattered throughout the liver, largest 3.5 cm inferiorly in the right liver. Several subcentimeter hypodense liver lesions scattered throughout the liver, too small to characterize, compatible with benign liver cysts as seen on MRI from 02/12/2020. No suspicious liver masses. Cholelithiasis. No definite gallbladder wall thickening. No gallbladder distention. No  biliary ductal dilatation. CBD diameter 3 mm. Pancreas: Normal, with no mass or duct dilation. Spleen: Mild-to-moderate splenomegaly. Craniocaudal splenic length 15.8 cm. No splenic mass. Adrenals/Urinary Tract: Normal adrenals. No hydronephrosis. Subcentimeter hypodense inferior left renal lesion, too small to characterize, unchanged, considered benign. No  suspicious renal masses. Chronic mild diffuse bladder wall thickening. Stomach/Bowel: Normal non-distended stomach. Postsurgical changes from subtotal right hemicolectomy with ileocolic anastomosis in the right abdomen. No dilated or thick-walled small bowel loops. Mild sigmoid diverticulosis with no definite large bowel wall thickening. Vascular/Lymphatic: Atherosclerotic nonaneurysmal abdominal aorta. 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 Chronic linear thrombus at main portal vein bifurcation (series 3/image 24). Asymmetrically diminutive right portal vein branches. Chronic nonocclusive thrombus in the anterior aspect of the proximal splenic vein (series 3/image 29). Patent diminutive hepatic veins. Patent renal veins with chronic narrowing of the left renal vein between the SMA and aorta. Small to moderate paraumbilical varices. No pathologically enlarged lymph nodes in the abdomen or pelvis. Reproductive: Mild prostatomegaly. Other: No pneumoperitoneum. Small volume ascites, most prominent in the pelvis. No focal fluid collection. Mild anasarca. Musculoskeletal: No aggressive appearing focal osseous lesions. Mild thoracolumbar spondylosis. IMPRESSION: 1. Cirrhosis. No suspicious liver masses. 2. 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 No acute vascular abnormality. 3. Small volume ascites. Mild anasarca. 4. Mild-to-moderate splenomegaly. Moderate lower esophageal and paraumbilical varices. 5. Large right pleural effusion. 6. Cholelithiasis.  No biliary ductal dilatation. 7. Mild sigmoid diverticulosis. 8. Chronic mild diffuse bladder wall thickening, probably due to chronic bladder outlet obstruction by the mildly enlarged prostate. 9. Aortic Atherosclerosis (ICD10-I70.0). Electronically Signed   By: Ilona Sorrel M.D.   On: 02/15/2020 08:40    Scheduled  Meds: . carbidopa-levodopa  1 tablet Oral BID AC  . cyclobenzaprine  10 mg Oral QHS  . enoxaparin (LOVENOX) injection  40 mg Subcutaneous QHS  . feeding supplement  237 mL Oral BID BM  . fentaNYL (SUBLIMAZE) injection  12.5 mcg Intravenous Once  . LORazepam  0.5 mg Intravenous Once  . midodrine  5 mg Oral TID WC  . multivitamin with minerals  1 tablet Oral Daily  . pantoprazole  40 mg Oral BID  . propranolol  20 mg Oral BID   Continuous Infusions: PRN Meds:.acetaminophen, morphine injection, Muscle Rub, naphazoline-glycerin, polyethylene glycol, Resource ThickenUp Clear   ASSESMENT:   *Hepatic hydrothorax.  02/11/20 thoracentesis.    On CT venogram yesterday the hydrothorax is again large. On CXR this morning the large right pleural effusion is overall improved but remains very large.  *  Ascites, tapped x 2 in 12/2018. Small volume abd ascites per current imaging.    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  Hepatic cysts.   MRI reveals advanced/progressive cirrhosis with portal hypertension,varices. Diminutive, nearly occluded right portal branches and very diminutive hepatic venous branches in central right hepatic lobe and caudate.  On CT venogram the hepatic veins are patent.  Paraumbilical varices. MELD-Na based on 1/5 labs: 22.    * Borderline renal fx and hyponatremia. Increased doses diuretics to aldactone 150/lasix 60 today 02/13/20 w caution . TID midodrine added 1/4 by attending "hopefully gives [place] more room for diuresis".   *Chronic, non-occlusive main PV thrombus.  Dates to at least 11/2018.    *Renal vein narrowing.  *    AKI, CKD.  Stable bun/creat.  Aldactone, lasix dose increased on 1/5 but d/c'd  on 1/6 (after 2nd higher dose)   *Uncomplicated cholelithiasis and biliary sludge.  *   Atrial fibrillation, not on anticoagulation.  *Abnormal pancreas imaging per MRI, pancrease normal on CT venogram.  .   *  Dysphagia in pt w parkinson's. on D2/nectar diet.    * Previous banding of esoph varices, last in 07/2019. On Inderal.   * Hyponatremia.   *Splenomegaly.?Splenic hypointensity signal on MRI, ?sequela of previous splenic infarct.  CT venogram w chronic, nonocclusive thrombus at the proximal splenic vein.  Thrombocytopenia resolved.  *Bilateral inguinal and umbilical hernias.     PLAN   *   ?  Is patient a TIPS candidate?   Based on meld score alone is not a candidate (>18 is contraindication) Still waiting on Echo reading.  Called the echo department again this morning.   Addendum 1327: Echo LVEF 50 to 55%.  Large R pleural effusion.  Non critical nitrakm tricuspid, aortic valve regurge.     Azucena Freed  02/15/2020, 9:43 AM Phone [phone]

## 2020-02-16 DIAGNOSIS — R932 Abnormal findings on diagnostic imaging of liver and biliary tract: Secondary | ICD-10-CM | POA: Diagnosis not present

## 2020-02-16 DIAGNOSIS — J90 Pleural effusion, not elsewhere classified: Secondary | ICD-10-CM | POA: Diagnosis not present

## 2020-02-16 MED ORDER — MIDODRINE HCL 5 MG PO TABS: 5.0000 mg | ORAL_TABLET | Freq: Three times a day (TID) | ORAL | 0 refills | Status: AC

## 2020-02-16 NOTE — Progress Notes (Signed)
Gave discharge instructions to patient. He verbalized understanding

## 2020-02-16 NOTE — Discharge Instructions (Signed)
Ascites  Ascites is a collection of too much fluid in the abdomen. Ascites can range from mild to severe. If ascites is not treated, it can get worse. What are the causes? This condition may be caused by:  A liver condition called cirrhosis. This is the most common cause of ascites.  Long-term (chronic) or alcoholic hepatitis.  Infection or inflammation in the abdomen.  Cancer in the abdomen.  Heart failure.  Kidney disease.  Inflammation of the pancreas.  Clots in the veins of the liver. What are the signs or symptoms? Symptoms of this condition include:  A feeling of fullness in the abdomen. This is common.  An increase in the size of the abdomen or waist.  Swelling in the legs.  Swelling of the scrotum (in men).  Difficulty breathing.  Pain in the abdomen.  Sudden weight gain. If the condition is mild, you may not have symptoms. How is this diagnosed? This condition is diagnosed based on your medical history and a physical exam. Your health care provider may order imaging tests, such as an ultrasound or CT scan of your abdomen. How is this treated? Treatment for this condition depends on the cause of the ascites. It may include:  Taking a pill to make you urinate. This is called a water pill (diuretic pill).  Strictly reducing your salt (sodium) intake. Salt can cause extra fluid to be kept (retained) in the body, and this makes ascites worse.  Having a procedure to remove fluid from your abdomen (paracentesis).  Having a procedure that connects two of the major veins within your liver and relieves pressure on your liver. This is called a TIPS procedure (transjugular intrahepatic portosystemic shunt procedure).  Placement of a drainage catheter (peritoneovenous shunt) to manage the extra fluid in the abdomen. Ascites may go away or improve when the condition that caused it is treated. Follow these instructions at home:  Keep track of your weight. To do this,  weigh yourself at the same time every day and write down your weight.  Keep track of how much you drink and any changes in how much or how often you urinate.  Follow any instructions that your health care provider gives you about how much to drink.  Try not to eat salty (high-sodium) foods.  Take over-the-counter and prescription medicines only as told by your health care provider.  Keep all follow-up visits as told by your health care provider. This is important.  Report any changes in your health to your health care provider, especially if you develop new symptoms or your symptoms get worse. Contact a health care provider if:  You gain more than 3 lb (1.36 kg) in 3 days.  Your waist size increases.  You have new swelling in your legs.  The swelling in your legs gets worse. Get help right away if:  You have a fever.  You are confused.  You have new or worsening breathing trouble.  You have new or worsening pain in your abdomen.  You have new or worsening swelling in the scrotum (in men). Summary  Ascites is a collection of too much fluid in the abdomen.  Ascites may be caused by various conditions, such as cirrhosis, hepatitis, cancer, or congestive heart failure.  Symptoms may include swelling of the abdomen and other areas due to extra fluid in the body.  Treatments may involve dietary changes, medicines, or procedures. This information is not intended to replace advice given to you by your health care   provider. Make sure you discuss any questions you have with your health care provider. Document Revised: 12/27/2017 Document Reviewed: 10/07/2016 [employer] Patient Education  2020 [employer]  

## 2020-02-16 NOTE — Discharge Summary (Signed)
Physician Discharge Summary  RUSSELL HOUSMAN HQP:[id number] DOB: Sep 24, 1940 DOA: 02/09/2020  PCP: Geoffry Paradise, MD  Admit date: 02/09/2020 Discharge date: 02/16/2020  Admitted From: Home Disposition: Home  Recommendations for Outpatient Follow-up:  1. Follow up with PCP in 1-2 weeks 2. Follow-up with Dr. Kathe Mariner of GI in 2 to 4 weeks 3. [facility] 4. Follow-up with Dr. Richardson Chiquito with [facility] IR as remain Dr. Joetta Manners feels TIPS is possible 5. Please obtain BMP/CBC in one week 6. Please follow up with your PCP on the following pending results: Unresulted Labs (From admission, onward)         None       Home Health: None Equipment/Devices: None  Discharge Condition: Stable CODE STATUS: Full code Diet recommendation: Low-sodium  Subjective: Seen and examined.  He has no complaints.  Brief/Interim Summary: Jedediah Al Powersis a 80 y.o.malewith a pertinent history of parkinsons GERD and history of esophageal stricture,cirrhosisof liver with portal hypertension and esophageal varices and thrombocytopenia, OSA on CPAP,, A. fib, aortic valve disorder, anemia, BPH, CAD, history of colon cancer status post chemotherapy which he thinks caused cirrhosis.  He presented to ED with a complaint of right lower chest pain, pleuritic in nature.  Some shortness of breath. In the emergency department 118/73, 98.1, HR 95, 95% on room air, RR 18. WBC 8.9, Hgb 12.8, PLT 149, NA 130, K4.5, CL 93, CO2 26, SCR 1.40, glucose 150.  CTA chest showed large pleural effusion on the right side. 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  He had thoracentesis 1/3, but had quick reaccumulation of fluid on repeat CXR on 1/4.    GI was consulted.  He was started on diuretics.  GI worked him up for possible TIPS procedure.  He underwent MRI abdomen and CT venogram, results as below.  Due to low blood pressure, he was also started on  midodrine.  Propranolol was continued.  Also had mild AKI which resolved.  His hyponatremia remained stable.  Eventually GI was not sure if he would be a good candidate for TIPS and since patient remained stable since last 3 to 4 days without further reaccumulation of pleural effusion requiring second thoracentesis so they cleared him for discharge.  GI has made an appointment for him to see them in 2 weeks and they recommended that he sees Dr. Zachs/[facility]patologist with the [facility] [place] and also sees IR.  GI will make all those arrangements for him as outpatient.  He is being discharged today since he is a stable.  Discharge Diagnoses:  Active Problems:   Pleural effusion on right   Pleural effusion   Pressure injury of skin   Protein-calorie malnutrition, severe   Abnormal MRI, liver    Discharge Instructions   Allergies as of 02/16/2020   No Known Allergies     Medication List    TAKE these medications   acetaminophen 500 MG tablet Commonly known as: TYLENOL Take 1,000 mg by mouth every 6 (six) hours as needed for mild pain or headache.   amphetamine-dextroamphetamine 30 MG tablet Commonly known as: ADDERALL Take 30 mg by mouth daily as needed (for awake in the morning).   carbidopa-levodopa 50-200 MG tablet Commonly known as: SINEMET CR TAKE 1 TABLET BY MOUTH TWICE DAILY 1 HOUR BEFORE BREAKFAST AND BEFORE DINNER What changed: See the new instructions.   DRY EYES OP Apply 1 drop to eye daily.  ferrous sulfate 325 (65 FE) MG tablet Take 325 mg by mouth 2 (two) times daily.   fluticasone 50 MCG/ACT nasal spray Commonly known as: FLONASE Place 2 sprays into both nostrils at bedtime as needed for allergies or rhinitis.   furosemide 40 MG tablet Commonly known as: LASIX Take 40 mg by mouth daily.   midodrine 5 MG tablet Commonly known as: PROAMATINE Take 1 tablet (5 mg total) by mouth 3 (three) times daily with meals.   pantoprazole 40 MG tablet Commonly known  as: PROTONIX Take 1 tablet (40 mg total) by mouth 2 (two) times daily.   propranolol 20 MG tablet Commonly known as: INDERAL Take 1 tablet (20 mg total) by mouth 2 (two) times daily.   spironolactone 100 MG tablet Commonly known as: ALDACTONE Take 100 mg by mouth daily.       Follow-up Information    Burnard Bunting, MD Follow up in 1 week(s).   Specialty: Internal Medicine Contact information: [place] [address] [phone]        Richardo Priest, MD .   Specialties: Cardiology, Radiology Contact information: [place] [address] [phone]              No Known Allergies  Consultations: GI   Procedures/Studies: DG Chest 2 View  Result Date: 02/09/2020 CLINICAL DATA:  80 year old male with aspiration EXAM: CHEST - 2 VIEW COMPARISON:  03/01/2018, 02/25/2016 FINDINGS: Cardiomediastinal silhouette unchanged in size and contour, with the right heart border is now obscured by right lung opacity. Air-fluid level of the right lung obscuring the right hemidiaphragm and the right heart border. No pneumothorax. No left-sided pleural fluid.  Coarsened interstitial markings. Opacity at the posterior lung base on the lateral view corresponds to the findings on the frontal. No displaced fracture.  Degenerative changes of the spine. IMPRESSION: New right pleural effusion with associated atelectasis/consolidation. Aspiration pneumonitis/pneumonia not excluded. Electronically Signed   By: Corrie Mckusick D.O.   On: 02/09/2020 10:15   CT Angio Chest PE W/Cm &/Or Wo Cm  Result Date: 02/09/2020 CLINICAL DATA:  High probability of pulmonary embolus. EXAM: CT ANGIOGRAPHY CHEST WITH CONTRAST TECHNIQUE: Multidetector CT imaging of the chest was performed using the standard protocol during bolus administration of intravenous contrast. Multiplanar CT image reconstructions and MIPs were obtained to evaluate the vascular anatomy. CONTRAST:  13mL OMNIPAQUE  IOHEXOL 350 MG/ML SOLN COMPARISON:  Jun 12, 2014. FINDINGS: Cardiovascular: Satisfactory opacification of the pulmonary arteries to the segmental level. No evidence of pulmonary embolism. Normal heart size. No pericardial effusion. Mediastinum/Nodes: No enlarged mediastinal, hilar, or axillary lymph nodes. Thyroid gland, trachea, and esophagus demonstrate no significant findings. Lungs/Pleura: No pneumothorax is noted. Large right pleural effusion is noted which results in right to left mediastinal shift. Complete atelectasis of the right lower lobe is noted. Partial atelectasis of the right upper and middle lobes is noted. Minimal left pleural effusion is noted with adjacent subsegmental atelectasis. Upper Abdomen: No acute abnormality. Musculoskeletal: No chest wall abnormality. No acute or significant osseous findings. Review of the MIP images confirms the above findings. IMPRESSION: 1. No definite evidence of pulmonary embolus. 2. Large right pleural effusion is noted which results in right to left mediastinal shift and complete atelectasis of the right lower lobe. Partial atelectasis of the right upper and middle lobes is noted. Minimal left pleural effusion is noted with adjacent subsegmental atelectasis. Electronically Signed   By: Marijo Conception M.D.   On: 02/09/2020 15:50  MR ABDOMEN W WO CONTRAST  Addendum Date: 02/13/2020   ADDENDUM REPORT: 02/13/2020 11:29 ADDENDUM: Question of nonocclusive filling defect in the anterior portal vein on the additional images which were performed to provide some clarification as to portal abnormalities. This could be artifactual and related to slow flow but is seen also on contrasted imaging. For this reason, would consider CT venogram for further assessment to better delineate portal structures as well as portal patency particularly in the RIGHT lobe of the liver. This would also provide additional information regarding the pancreatic/portal interface and possibility  of small focal cystic area in the midportion of the pancreas. Would also correlate with lipase to ensure there is no active pancreatic inflammation. These results will be called to the ordering clinician or representative by the Radiologist Assistant, and communication documented in the PACS or [employer]. Electronically Signed   By: Zetta Bills M.D.   On: 02/13/2020 11:29   Result Date: 02/13/2020 CLINICAL DATA:  Liver disease, chronic portal hypertension assessing in consideration for tips procedure. EXAM: MRI ABDOMEN WITHOUT AND WITH CONTRAST TECHNIQUE: Multiplanar multisequence MR imaging of the abdomen was performed both before and after the administration of intravenous contrast. CONTRAST:  39mL GADAVIST GADOBUTROL 1 MMOL/ML IV SOLN COMPARISON:  Prior studies from 2016 and recent chest CT. FINDINGS: Lower chest: Large RIGHT pleural effusion. Basilar collapse os consolidation. Hepatobiliary: Lobular hepatic contours. Marked hypertrophy of the caudate lobe. Assessment of hepatic parenchyma and in general assessment of abdominal structures is limited due to motion related artifact presence of ascites. The liver shows increased nodularity and parenchymal distortion since the previous study with suggestion of peripheral biliary duct distension as well, in addition to marked caudate hypertrophy No significant fat or iron deposition grossly accounting for limitations of the exam. Associated splenomegaly. The main portal vein, SMV and splenic veins are grossly patent. There is either extrinsic or intrinsic abnormality of the main portal vein outlined below Either diminutive or occluded RIGHT portal vein, a faint contrast filled structure extends from the portal venous bifurcation towards the RIGHT hepatic lobe perhaps small branch of the RIGHT portal vein. This is best seen on images 67 through 55. Limited visualization of hepatic venous structures, perhaps diminutive hepatic venous structures across the top  of the hypertrophied central hepatic parenchyma. Better seen on coronal images where the middle hepatic vein can be visualized perhaps limited visualization also of a diminutive RIGHT hepatic vein. Hepatic cysts are present. Further evaluation of hepatic parenchyma is limited. No extrahepatic biliary duct dilation grossly with signs of cholelithiasis and biliary sludge. Pancreas: Hypointense area along the interface between the pancreas and the splenic portal confluence best seen on images 84 through 87 of series 804. Mixed signal on T2 weighted images in this location best seen on image 26 of series 4 with 1 area showing a lenticular configuration measuring approximately 1.6 x 1.3 cm. Another area of signal variation appearing more linear within or along the main portal vein along the anterior aspect of the main portal vein image 25 of series 4. Spleen: Splenomegaly with 17 cm craniocaudal span. Areas of hypointensity on T2 may reflect sequela of prior splenic infarct. No focal mass lesion. Adrenals/Urinary Tract: Adrenal glands are grossly normal. There is no hydronephrosis. The kidneys enhance symmetrically. Stomach/Bowel: Gastrointestinal tract with signs of edema in the setting of portal hypertension. Small volume ascites about the GI tract. Portion of the small bowel may extend into a ascites and fat containing ventral/umbilical hernia. Vascular/Lymphatic: Vascular abnormalities  as described. Diminutive LEFT renal vein as it crosses beneath the SMA possible mild splenorenal shunting and some lumbar collaterals. Patency in direct continuity with the IVC cannot be assessed. Presence of lumbar collateral suggest is a chronic finding and the symmetry of renal enhancement is also supportive of this possibility. Other: Small volume ascites and signs of bowel edema in the setting of portal hypertension. Musculoskeletal: No suspicious bone lesions identified. IMPRESSION: Hepatic hydrothorax. Diminutive, nearly  occluded RIGHT portal branches with very diminutive hepatic venous branches tracking over the hypertrophied central RIGHT hepatic lobe and caudate. Potential nonocclusive filling defect in main portal vein versus extrinsic compression. Lenticular area in the pancreas is more likely to be extrinsic, correlate with any recent history of pancreatitis. Area is not well assessed in could also represent a small cystic pancreatic neoplasm but shows no enhancement or gross pancreatic ductal dilation. Signs of advanced liver disease/cirrhosis with portal hypertension including varices and splenomegaly. Morphologic changes of the liver are more akin to what can be seen in the setting of [place] and are markedly worse than on the study from 2016 in terms of caudate hypertrophy. Would correlate with any primary biliary abnormalities or intervening history of cholangitis though findings remain nonspecific in the setting of chronic liver disease. Marked narrowing of the renal vein as it crosses beneath the SMV. Patency in direct continuity with the IVC cannot be assessed though this is suspected to be a chronic finding given symmetry of renal enhancement and presence of lumbar collaterals. Cholelithiasis with biliary sludge. Additional imaging with CT may be of benefit to further evaluate the portal vein in particular given the motion artifact that is present on the current examination. An additional sequence may be attempted on MRI to further clarify above findings including the renal venous finding. However, if this is not possible or images are inadequate for assessment, CT may ultimately be beneficial to further map vascular structures as warranted. These results will be called to the ordering clinician or representative by the Radiologist Assistant, and communication documented in the PACS or [employer]. Electronically Signed: By: Zetta Bills M.D. On: 02/13/2020 08:45   US Abdomen Limited  Result Date:  02/12/2020 CLINICAL DATA:  Liver fibrosis.  Rule out ascites. EXAM: LIMITED ABDOMEN ULTRASOUND FOR ASCITES TECHNIQUE: Limited ultrasound survey for ascites was performed in all four abdominal quadrants. COMPARISON:  Ultrasound abdomen 12/25/2014 FINDINGS: Small amount ascites in the abdomen. Small ascites in the right upper quadrant and left lower quadrant. Large right pleural effusion. IMPRESSION: Small amount of ascites. Large right pleural effusion. Electronically Signed   By: Franchot Gallo M.D.   On: 02/12/2020 14:39   DG CHEST PORT 1 VIEW  Result Date: 02/15/2020 CLINICAL DATA:  Hydrothorax EXAM: PORTABLE CHEST 1 VIEW COMPARISON:  02/12/2020 FINDINGS: Improved but still very large right pleural effusion obscuring most of the right lung. Based on rotation it is difficult to detect if there is mediastinal shift either direction. Clear left lung. IMPRESSION: Improved but still very large right pleural effusion. Electronically Signed   By: Monte Fantasia M.D.   On: 02/15/2020 06:36   DG CHEST PORT 1 VIEW  Result Date: 02/12/2020 CLINICAL DATA:  Pleural effusion. EXAM: PORTABLE CHEST 1 VIEW COMPARISON:  Chest x-ray 02/11/2020. FINDINGS: Large right pleural effusion again noted. Interval progression from prior exam. Underlying atelectasis/infiltrate most likely present. Left lung is clear. Heart size difficult to evaluate due to large right pleural effusion. No pneumothorax. IMPRESSION: Large right pleural effusion again noted.  Interval progression from prior exam. Underlying atelectasis/infiltrate most likely present. Electronically Signed   By: Marcello Moores  Register   On: 02/12/2020 06:19   DG CHEST PORT 1 VIEW  Result Date: 02/11/2020 CLINICAL DATA:  Status post thoracentesis. EXAM: PORTABLE CHEST 1 VIEW COMPARISON:  February 09, 2020. FINDINGS: Moderate right pleural effusion is unchanged, with probable underlying atelectasis or infiltrate. No pneumothorax is noted. Left lung is clear. Bony thorax is  unremarkable. IMPRESSION: Stable moderate right pleural effusion with probable underlying atelectasis or infiltrate. No pneumothorax is noted. Electronically Signed   By: Marijo Conception M.D.   On: 02/11/2020 15:16   ECHOCARDIOGRAM COMPLETE  Result Date: 02/15/2020    ECHOCARDIOGRAM REPORT   Patient Name:   JOSHUAMICHAEL PATRICE Date of Exam: 02/13/2020 Medical Rec #:  [id number]       Height:       72.0 in Accession #:    [id number]      Weight:       135.0 lb Date of Birth:  01/30/1941       BSA:          1.803 m Patient Age:    51 years        BP:           107/62 mmHg Patient Gender: M               HR:           83 bpm. Exam Location:  Inpatient Procedure: 2D Echo Indications:    pleural effusion  History:        Patient has no prior history of Echocardiogram examinations.                 CAD, Cirrhosis, Parkinson's,, Arrythmias:Atrial Fibrillation;                 Risk Factors:Sleep Apnea.  Sonographer:    Johny Chess Referring Phys: [phone] A CALDWELL POWELL [place]  1. Very mild distal septal hypokinesis . Left ventricular ejection fraction, by estimation, is 50 to 55%. The left ventricle has low normal function. Left ventricular diastolic parameters are indeterminate.  2. Right ventricular systolic function is low normal. The right ventricular size is normal. There is normal pulmonary artery systolic pressure.  3. Large R pleural effusion causes some indentation of RA. A trivial pericardial effusion is present.  4. Mild to moderate mitral valve regurgitation.  5. Tricuspid valve regurgitation is moderate.  6. The aortic valve is abnormal. Aortic valve regurgitation is mild. Mild to moderate aortic valve sclerosis/calcification is present, without any evidence of aortic stenosis.  7. The inferior vena cava is normal in size with greater than 50% respiratory variability, suggesting right atrial pressure of 3 mmHg. FINDINGS  Left Ventricle: Very mild distal septal hypokinesis. Left ventricular ejection  fraction, by estimation, is 50 to 55%. The left ventricle has low normal function. The left ventricular internal cavity size was normal in size. There is no left ventricular hypertrophy. Left ventricular diastolic parameters are indeterminate. Right Ventricle: The right ventricular size is normal. Right vetricular wall thickness was not assessed. Right ventricular systolic function is low normal. There is normal pulmonary artery systolic pressure. The tricuspid regurgitant velocity is 2.36 m/s, and with an assumed right atrial pressure of 3 mmHg, the estimated right ventricular systolic pressure is [id number] mmHg. Left [facility]: Left atrial size was normal in size. Right [facility]: Right atrial size was normal in size. Pericardium: Large R pleural effusion causes  some indentation of RA. A small pericardial effusion is present. Mitral Valve: The mitral valve is abnormal. There is mild thickening of the mitral valve leaflet(s). There is mild calcification of the mitral valve leaflet(s). Mild mitral annular calcification. Mild to moderate mitral valve regurgitation. Tricuspid Valve: The tricuspid valve is normal in structure. Tricuspid valve regurgitation is moderate. Aortic Valve: The aortic valve is abnormal. Aortic valve regurgitation is mild. Mild to moderate aortic valve sclerosis/calcification is present, without any evidence of aortic stenosis. Pulmonic Valve: The pulmonic valve was grossly normal. Pulmonic valve regurgitation is trivial. Aorta: The aortic root is normal in size and structure. Venous: The inferior vena cava is normal in size with greater than 50% respiratory variability, suggesting right atrial pressure of 3 mmHg. IAS/Shunts: The interatrial septum was not assessed. Additional Comments: There is a large pleural effusion in the right lateral region.  LEFT VENTRICLE PLAX 2D LVIDd:         4.00 cm LVIDs:         3.30 cm LV PW:         1.00 cm LV IVS:        0.60 cm LVOT diam:     2.30 cm LVOT Area:     4.15  cm  RIGHT VENTRICLE             IVC RV S prime:     14.80 cm/s  IVC diam: 0.90 cm TAPSE (M-mode): 1.5 cm LEFT ATRIUM             Index       RIGHT ATRIUM           Index LA diam:        3.60 cm 2.00 cm/m  RA Area:     10.90 cm LA Vol (A2C):   57.3 ml 31.78 ml/m RA Volume:   22.60 ml  12.54 ml/m LA Vol (A4C):   54.5 ml 30.23 ml/m LA Biplane Vol: 56.2 ml 31.17 ml/m   AORTA Ao Root diam: 3.85 cm Ao Asc diam:  3.40 cm TRICUSPID VALVE TR Peak grad:   22.3 mmHg TR Vmax:        236.00 cm/s  SHUNTS Systemic Diam: 2.30 cm Dorris Carnes MD Electronically signed by Dorris Carnes MD Signature Date/Time: 02/15/2020/12:41:15 PM    Final    CT VENOGRAM ABD/PEL  Result Date: 02/15/2020 CLINICAL DATA:  Possible thrombus in the portal vein on recent MRI. Cirrhosis. Portal hypertension. EXAM: CT ABDOMEN AND PELVIS WITH CONTRAST TECHNIQUE: Multidetector CT imaging of the abdomen and pelvis was performed using the standard protocol following bolus administration of intravenous contrast. CONTRAST:  135mL OMNIPAQUE IOHEXOL 350 MG/ML SOLN COMPARISON:  02/12/2020 MRI abdomen. 11/01/2014 CT abdomen/pelvis. FINDINGS: Lower chest: Large right pleural effusion with right lung base atelectasis. Moderate lower esophageal varices. Right coronary atherosclerosis. Hepatobiliary: Prominent atrophy of the right liver lobe with prominent hypertrophy of the central liver and caudate lobe of the liver with lobulated outer liver contour, compatible with cirrhosis. Several simple liver cysts scattered throughout the liver, largest 3.5 cm inferiorly in the right liver. Several subcentimeter hypodense liver lesions scattered throughout the liver, too small to characterize, compatible with benign liver cysts as seen on MRI from 02/12/2020. No suspicious liver masses. Cholelithiasis. No definite gallbladder wall thickening. No gallbladder distention. No biliary ductal dilatation. CBD diameter 3 mm. Pancreas: Normal, with no mass or duct dilation. Spleen:  Mild-to-moderate splenomegaly. Craniocaudal splenic length 15.8 cm. No splenic mass. Adrenals/Urinary Tract: Normal adrenals.  No hydronephrosis. Subcentimeter hypodense inferior left renal lesion, too small to characterize, unchanged, considered benign. No suspicious renal masses. Chronic mild diffuse bladder wall thickening. Stomach/Bowel: Normal non-distended stomach. Postsurgical changes from subtotal right hemicolectomy with ileocolic anastomosis in the right abdomen. No dilated or thick-walled small bowel loops. Mild sigmoid diverticulosis with no definite large bowel wall thickening. Vascular/Lymphatic: Atherosclerotic nonaneurysmal abdominal aorta. 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 Chronic linear thrombus at main portal vein bifurcation (series 3/image 24). Asymmetrically diminutive right portal vein branches. Chronic nonocclusive thrombus in the anterior aspect of the proximal splenic vein (series 3/image 29). Patent diminutive hepatic veins. Patent renal veins with chronic narrowing of the left renal vein between the SMA and aorta. Small to moderate paraumbilical varices. No pathologically enlarged lymph nodes in the abdomen or pelvis. Reproductive: Mild prostatomegaly. Other: No pneumoperitoneum. Small volume ascites, most prominent in the pelvis. No focal fluid collection. Mild anasarca. Musculoskeletal: No aggressive appearing focal osseous lesions. Mild thoracolumbar spondylosis. IMPRESSION: 1. Cirrhosis. No suspicious liver masses. 2. 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 No acute vascular abnormality. 3. Small volume ascites. Mild anasarca. 4. Mild-to-moderate splenomegaly. Moderate lower esophageal and paraumbilical varices. 5. Large right pleural effusion. 6. Cholelithiasis.  No biliary ductal dilatation. 7. Mild sigmoid diverticulosis.  8. Chronic mild diffuse bladder wall thickening, probably due to chronic bladder outlet obstruction by the mildly enlarged prostate. 9. Aortic Atherosclerosis (ICD10-I70.0). Electronically Signed   By: Ilona Sorrel M.D.   On: 02/15/2020 08:40      Discharge Exam: Vitals:   02/15/20 2018 02/16/20 0404  BP: (!) 98/57 (!) 103/54  Pulse: 78 65  Resp: 17 16  Temp: (!) 97.5 F (36.4 C) 97.7 F (36.5 C)  SpO2: 99% 93%   Vitals:   02/15/20 0440 02/15/20 1620 02/15/20 2018 02/16/20 0404  BP: 110/80 98/66 (!) 98/57 (!) 103/54  Pulse: 92 78 78 65  Resp: 18 18 17 16   Temp: 98.2 F (36.8 C) (!) 97.5 F (36.4 C) (!) 97.5 F (36.4 C) 97.7 F (36.5 C)  TempSrc:  Oral Axillary Oral  SpO2: 97% 100% 99% 93%  Weight:      Height:        General: Pt is alert, awake, not in acute distress Cardiovascular: RRR, S1/S2 +, no rubs, no gallops Respiratory: Slightly diminished breath sounds at the right lower lobe, no wheezing, no rhonchi Abdominal: Soft, NT, ND, bowel sounds + Extremities: no edema, no cyanosis    The results of significant diagnostics from this hospitalization (including imaging, microbiology, ancillary and laboratory) are listed below for reference.     Microbiology: Recent Results (from the past 240 hour(s))  Resp Panel by RT-PCR (Flu A&B, Covid) Nasopharyngeal Swab     Status: None   Collection Time: 02/09/20  5:28 PM   Specimen: Nasopharyngeal Swab; Nasopharyngeal(NP) swabs in vial transport medium  Result Value Ref Range Status   SARS Coronavirus 2 by RT PCR NEGATIVE NEGATIVE Final    Comment: (NOTE) SARS-CoV-2 target nucleic acids are NOT DETECTED.  The SARS-CoV-2 RNA is generally detectable in upper respiratory specimens during the acute phase of infection. The lowest concentration of SARS-CoV-2 viral copies this assay can detect is 138 copies/mL. A negative result does not preclude SARS-Cov-2 infection and should not be used as the sole basis for treatment  or other patient management decisions. A negative result may occur with  improper specimen collection/handling, submission of specimen other than nasopharyngeal swab, presence of viral mutation(s) within the areas targeted by this assay, and inadequate number of viral copies(<138 copies/mL). A negative result must be combined with clinical observations, patient history, and epidemiological information. The expected result is Negative.  Fact Sheet for Patients:  EntrepreneurPulse.com.au  Fact Sheet for Healthcare Providers:  IncredibleEmployment.be  This test is no t yet approved or cleared by the [place] FDA and  has been authorized for detection and/or diagnosis of SARS-CoV-2 by FDA under an Emergency Use Authorization (EUA). This EUA will remain  in effect (meaning this test can be used) for the duration of the COVID-19 declaration under Section 564(b)(1) of the Act, 21 U.S.C.section 360bbb-3(b)(1), unless the authorization is terminated  or revoked sooner.       Influenza A by PCR NEGATIVE NEGATIVE Final   Influenza B by PCR NEGATIVE NEGATIVE Final    Comment: (NOTE) The Xpert Xpress SARS-CoV-2/FLU/RSV plus assay is intended as an aid in the diagnosis of influenza from Nasopharyngeal swab specimens and should not be used as a sole basis for treatment. Nasal washings and aspirates are unacceptable for Xpert Xpress SARS-CoV-2/FLU/RSV testing.  Fact Sheet for Patients: EntrepreneurPulse.com.au  Fact Sheet for Healthcare Providers: IncredibleEmployment.be  This test is not yet approved or cleared by the [place] FDA and has been authorized for detection and/or diagnosis of SARS-CoV-2 by FDA under an Emergency Use Authorization (EUA). This EUA will remain in effect (meaning this test can be used) for the duration of the COVID-19 declaration under Section 564(b)(1) of the Act, 21 U.S.C. section  360bbb-3(b)(1), unless the authorization is terminated or revoked.  Performed at [facility], [place] [address] [place], [place] [address]   Pleural Fluid culture (includes gram stain)     Status: None   Collection Time: 02/11/20  2:18 PM   Specimen: Pleural Fluid  Result Value Ref Range Status   Specimen Description PLEURAL FLUID  Final   Special Requests NONE  Final   Gram Stain   Final    WBC PRESENT,BOTH PMN AND MONONUCLEAR NO ORGANISMS SEEN CYTOSPIN SMEAR    Culture   Final    NO GROWTH Performed at [facility], [address] [place], [place] [address]    Report Status 02/14/2020 FINAL  Final     Labs: BNP (last 3 results) Recent Labs    02/09/20 2010  BNP 599.3*   Basic Metabolic Panel: Recent Labs  Lab 02/11/20 0238 02/12/20 0149 02/13/20 0059 02/14/20 0056 02/14/20 1522 02/15/20 0911  NA 131* 129* 129* 126* 132* 129*  K 4.8 4.3 4.6 4.1 3.9 4.5  CL 94* 97* 94* 92* 95* 94*  CO2 25 23 25 23 25 23   GLUCOSE 126* 122* 183* 166* 145* 168*  BUN 35* 34* 30* 33* 31* 33*  CREATININE 1.37* 1.12 1.26* 1.32* 1.31* 1.46*  CALCIUM 9.0 8.4* 8.9 8.3* 9.0 9.0  MG 1.9 1.9  --   --   --   --   PHOS 3.7 2.8  --   --   --   --    Liver Function Tests: Recent Labs  Lab 02/10/20 1014 02/11/20 0238 02/11/20 1058 02/12/20 0149 02/13/20 0059 02/14/20 0056  AST 20 29  --  15 22 16   ALT 21 13  --  10 5 7   ALKPHOS 83 84  --  87 95 102  BILITOT 2.8* 2.1*  --  1.4* 1.6* 1.1  PROT  6.4* 6.5  --  6.4* 7.4 6.2*  ALBUMIN 2.6* 2.6* 2.9* 2.5* 2.9* 2.5*   No results for input(s): LIPASE, AMYLASE in the last 168 hours. No results for input(s): AMMONIA in the last 168 hours. CBC: Recent Labs  Lab 02/10/20 1014 02/11/20 0238 02/12/20 0149 02/13/20 0059 02/14/20 0056 02/15/20 0911  WBC 5.4 5.5 4.9 8.0 5.4 7.6  NEUTROABS 4.3 4.4 3.8  --  3.9 6.1  HGB 12.4* 12.5* 11.9* 14.1 12.2* 13.8  HCT 33.7* 33.6* 33.6* 40.4 35.0* 38.6*  MCV 86.9 85.7 86.4 87.1 87.5 87.3   PLT 123* 161 122* 153 141* 172   Cardiac Enzymes: No results for input(s): CKTOTAL, CKMB, CKMBINDEX, TROPONINI in the last 168 hours. BNP: Invalid input(s): POCBNP CBG: No results for input(s): GLUCAP in the last 168 hours. D-Dimer No results for input(s): DDIMER in the last 72 hours. Hgb A1c No results for input(s): HGBA1C in the last 72 hours. Lipid Profile No results for input(s): CHOL, HDL, LDLCALC, TRIG, CHOLHDL, LDLDIRECT in the last 72 hours. Thyroid function studies No results for input(s): TSH, T4TOTAL, T3FREE, THYROIDAB in the last 72 hours.  Invalid input(s): FREET3 Anemia work up No results for input(s): VITAMINB12, FOLATE, FERRITIN, TIBC, IRON, RETICCTPCT in the last 72 hours. Urinalysis    Component Value Date/Time   COLORURINE yellow 12/24/2009 0927   APPEARANCEUR Clear 12/24/2009 0927   LABSPEC 1.010 03/15/2010 1806   PHURINE 7.0 03/15/2010 1806   HGBUR NEGATIVE 03/15/2010 1806   HGBUR negative 12/24/2009 0927   BILIRUBINUR n 04/10/2014 1011   KETONESUR NEGATIVE 03/15/2010 1806   PROTEINUR n 04/10/2014 1011   PROTEINUR NEGATIVE 03/15/2010 1806   UROBILINOGEN 0.2 04/10/2014 1011   UROBILINOGEN 0.2 03/15/2010 1806   NITRITE n 04/10/2014 1011   NITRITE NEGATIVE 03/15/2010 1806   LEUKOCYTESUR Negative 04/10/2014 1011   Sepsis Labs Invalid input(s): PROCALCITONIN,  WBC,  LACTICIDVEN Microbiology Recent Results (from the past 240 hour(s))  Resp Panel by RT-PCR (Flu A&B, Covid) Nasopharyngeal Swab     Status: None   Collection Time: 02/09/20  5:28 PM   Specimen: Nasopharyngeal Swab; Nasopharyngeal(NP) swabs in vial transport medium  Result Value Ref Range Status   SARS Coronavirus 2 by RT PCR NEGATIVE NEGATIVE Final    Comment: (NOTE) SARS-CoV-2 target nucleic acids are NOT DETECTED.  The SARS-CoV-2 RNA is generally detectable in upper respiratory specimens during the acute phase of infection. The lowest concentration of SARS-CoV-2 viral copies this  assay can detect is 138 copies/mL. A negative result does not preclude SARS-Cov-2 infection and should not be used as the sole basis for treatment or other patient management decisions. A negative result may occur with  improper specimen collection/handling, submission of specimen other than nasopharyngeal swab, presence of viral mutation(s) within the areas targeted by this assay, and inadequate number of viral copies(<138 copies/mL). A negative result must be combined with clinical observations, patient history, and epidemiological information. The expected result is Negative.  Fact Sheet for Patients:  EntrepreneurPulse.com.au  Fact Sheet for Healthcare Providers:  IncredibleEmployment.be  This test is no t yet approved or cleared by the [place] FDA and  has been authorized for detection and/or diagnosis of SARS-CoV-2 by FDA under an Emergency Use Authorization (EUA). This EUA will remain  in effect (meaning this test can be used) for the duration of the COVID-19 declaration under Section 564(b)(1) of the Act, 21 U.S.C.section 360bbb-3(b)(1), unless the authorization is terminated  or revoked sooner.  Influenza A by PCR NEGATIVE NEGATIVE Final   Influenza B by PCR NEGATIVE NEGATIVE Final    Comment: (NOTE) The Xpert Xpress SARS-CoV-2/FLU/RSV plus assay is intended as an aid in the diagnosis of influenza from Nasopharyngeal swab specimens and should not be used as a sole basis for treatment. Nasal washings and aspirates are unacceptable for Xpert Xpress SARS-CoV-2/FLU/RSV testing.  Fact Sheet for Patients: EntrepreneurPulse.com.au  Fact Sheet for Healthcare Providers: IncredibleEmployment.be  This test is not yet approved or cleared by the [place] FDA and has been authorized for detection and/or diagnosis of SARS-CoV-2 by FDA under an Emergency Use Authorization (EUA). This EUA will  remain in effect (meaning this test can be used) for the duration of the COVID-19 declaration under Section 564(b)(1) of the Act, 21 U.S.C. section 360bbb-3(b)(1), unless the authorization is terminated or revoked.  Performed at [facility], [place] [address], [place], [place] [address]   Pleural Fluid culture (includes gram stain)     Status: None   Collection Time: 02/11/20  2:18 PM   Specimen: Pleural Fluid  Result Value Ref Range Status   Specimen Description PLEURAL FLUID  Final   Special Requests NONE  Final   Gram Stain   Final    WBC PRESENT,BOTH PMN AND MONONUCLEAR NO ORGANISMS SEEN CYTOSPIN SMEAR    Culture   Final    NO GROWTH Performed at [facility], [address], [place], [place] [address]    Report Status 02/14/2020 FINAL  Final     Time coordinating discharge: Over 30 minutes  SIGNED:   Darliss Cheney, MD  [facility] 02/16/2020, 10:41 AM  If 7PM-7AM, please contact night-coverage [email or web address]

## 2020-02-18 ENCOUNTER — Other Ambulatory Visit: Payer: Self-pay

## 2020-02-18 ENCOUNTER — Telehealth: Payer: Self-pay

## 2020-02-18 DIAGNOSIS — K7460 Unspecified cirrhosis of liver: Secondary | ICD-10-CM

## 2020-02-18 DIAGNOSIS — D649 Anemia, unspecified: Secondary | ICD-10-CM

## 2020-02-18 DIAGNOSIS — R188 Other ascites: Secondary | ICD-10-CM

## 2020-02-18 NOTE — Telephone Encounter (Signed)
Order on Epic for repeat labs and patient agrees to come into our lab on 02/20/2020 for them. Office visit scheduled on 03/03/20 with Dr. Hilarie Fredrickson. Referral faxed to Dr. Patsy Baltimore, S. at [phone]. Patient notified their office would be contacting him for an appt.

## 2020-02-18 NOTE — Telephone Encounter (Signed)
-----   Message from Jerene Bears, MD sent at 02/15/2020  6:20 PM EST ----- Pt needs:  CBC, CMP, INR next Thursday, 03/08/2020 -- [place] Office visit with me in 2 to 4 weeks Referral to see Dr. Camillia Herter at [facility] in [place], he has seen Dr. Patsy Baltimore before but when he was at [facility].  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 Annamaria Boots  The chart says he has been dismissed but I do not think that is the case.  He saw [facility] previously but Dr. Havery Moros told me he did not dismiss him.  Patient and family now want to see me given our contact over this week in the hospital.  Thanks JMP

## 2020-02-20 DIAGNOSIS — H903 Sensorineural hearing loss, bilateral: Secondary | ICD-10-CM | POA: Diagnosis not present

## 2020-02-20 DIAGNOSIS — K7469 Other cirrhosis of liver: Secondary | ICD-10-CM | POA: Diagnosis not present

## 2020-02-20 DIAGNOSIS — K769 Liver disease, unspecified: Secondary | ICD-10-CM | POA: Diagnosis not present

## 2020-02-20 DIAGNOSIS — H6041 Cholesteatoma of right external ear: Secondary | ICD-10-CM | POA: Diagnosis not present

## 2020-02-20 DIAGNOSIS — G20 Parkinson's disease: Secondary | ICD-10-CM | POA: Diagnosis not present

## 2020-02-20 DIAGNOSIS — H6121 Impacted cerumen, right ear: Secondary | ICD-10-CM | POA: Diagnosis not present

## 2020-02-20 DIAGNOSIS — I8500 Esophageal varices without bleeding: Secondary | ICD-10-CM | POA: Diagnosis not present

## 2020-02-21 ENCOUNTER — Other Ambulatory Visit (HOSPITAL_COMMUNITY): Payer: Medicare Other

## 2020-02-21 ENCOUNTER — Emergency Department (HOSPITAL_COMMUNITY): Payer: Medicare Other

## 2020-02-21 ENCOUNTER — Telehealth: Payer: Self-pay | Admitting: Gastroenterology

## 2020-02-21 ENCOUNTER — Inpatient Hospital Stay (HOSPITAL_COMMUNITY): Payer: Medicare Other

## 2020-02-21 ENCOUNTER — Other Ambulatory Visit: Payer: Self-pay

## 2020-02-21 ENCOUNTER — Inpatient Hospital Stay (HOSPITAL_COMMUNITY)
Admission: EM | Admit: 2020-02-21 | Discharge: 2020-03-11 | DRG: 177 | Disposition: E | Payer: Medicare Other | Attending: Critical Care Medicine | Admitting: Critical Care Medicine

## 2020-02-21 ENCOUNTER — Encounter (HOSPITAL_COMMUNITY): Payer: Self-pay

## 2020-02-21 DIAGNOSIS — K257 Chronic gastric ulcer without hemorrhage or perforation: Secondary | ICD-10-CM | POA: Diagnosis present

## 2020-02-21 DIAGNOSIS — I81 Portal vein thrombosis: Secondary | ICD-10-CM | POA: Diagnosis present

## 2020-02-21 DIAGNOSIS — I4891 Unspecified atrial fibrillation: Secondary | ICD-10-CM | POA: Diagnosis not present

## 2020-02-21 DIAGNOSIS — I8510 Secondary esophageal varices without bleeding: Secondary | ICD-10-CM | POA: Diagnosis present

## 2020-02-21 DIAGNOSIS — G4733 Obstructive sleep apnea (adult) (pediatric): Secondary | ICD-10-CM | POA: Diagnosis present

## 2020-02-21 DIAGNOSIS — Z833 Family history of diabetes mellitus: Secondary | ICD-10-CM

## 2020-02-21 DIAGNOSIS — K4090 Unilateral inguinal hernia, without obstruction or gangrene, not specified as recurrent: Secondary | ICD-10-CM

## 2020-02-21 DIAGNOSIS — Z8042 Family history of malignant neoplasm of prostate: Secondary | ICD-10-CM

## 2020-02-21 DIAGNOSIS — Z681 Body mass index (BMI) 19 or less, adult: Secondary | ICD-10-CM

## 2020-02-21 DIAGNOSIS — Z452 Encounter for adjustment and management of vascular access device: Secondary | ICD-10-CM

## 2020-02-21 DIAGNOSIS — E860 Dehydration: Secondary | ICD-10-CM | POA: Diagnosis present

## 2020-02-21 DIAGNOSIS — D696 Thrombocytopenia, unspecified: Secondary | ICD-10-CM | POA: Diagnosis present

## 2020-02-21 DIAGNOSIS — Z515 Encounter for palliative care: Secondary | ICD-10-CM

## 2020-02-21 DIAGNOSIS — J918 Pleural effusion in other conditions classified elsewhere: Secondary | ICD-10-CM | POA: Diagnosis present

## 2020-02-21 DIAGNOSIS — Z20822 Contact with and (suspected) exposure to covid-19: Secondary | ICD-10-CM | POA: Diagnosis present

## 2020-02-21 DIAGNOSIS — D649 Anemia, unspecified: Secondary | ICD-10-CM | POA: Diagnosis not present

## 2020-02-21 DIAGNOSIS — J9601 Acute respiratory failure with hypoxia: Secondary | ICD-10-CM | POA: Diagnosis not present

## 2020-02-21 DIAGNOSIS — K8020 Calculus of gallbladder without cholecystitis without obstruction: Secondary | ICD-10-CM | POA: Diagnosis present

## 2020-02-21 DIAGNOSIS — Z800 Family history of malignant neoplasm of digestive organs: Secondary | ICD-10-CM

## 2020-02-21 DIAGNOSIS — Z85038 Personal history of other malignant neoplasm of large intestine: Secondary | ICD-10-CM

## 2020-02-21 DIAGNOSIS — K766 Portal hypertension: Secondary | ICD-10-CM

## 2020-02-21 DIAGNOSIS — I8500 Esophageal varices without bleeding: Secondary | ICD-10-CM | POA: Diagnosis not present

## 2020-02-21 DIAGNOSIS — R188 Other ascites: Secondary | ICD-10-CM | POA: Diagnosis present

## 2020-02-21 DIAGNOSIS — H908 Mixed conductive and sensorineural hearing loss, unspecified: Secondary | ICD-10-CM | POA: Diagnosis present

## 2020-02-21 DIAGNOSIS — J690 Pneumonitis due to inhalation of food and vomit: Principal | ICD-10-CM | POA: Diagnosis present

## 2020-02-21 DIAGNOSIS — G9341 Metabolic encephalopathy: Secondary | ICD-10-CM | POA: Diagnosis present

## 2020-02-21 DIAGNOSIS — G609 Hereditary and idiopathic neuropathy, unspecified: Secondary | ICD-10-CM | POA: Diagnosis present

## 2020-02-21 DIAGNOSIS — K7400 Hepatic fibrosis, unspecified: Secondary | ICD-10-CM | POA: Diagnosis present

## 2020-02-21 DIAGNOSIS — N182 Chronic kidney disease, stage 2 (mild): Secondary | ICD-10-CM | POA: Diagnosis present

## 2020-02-21 DIAGNOSIS — E875 Hyperkalemia: Secondary | ICD-10-CM | POA: Diagnosis present

## 2020-02-21 DIAGNOSIS — I2510 Atherosclerotic heart disease of native coronary artery without angina pectoris: Secondary | ICD-10-CM | POA: Diagnosis present

## 2020-02-21 DIAGNOSIS — N320 Bladder-neck obstruction: Secondary | ICD-10-CM | POA: Diagnosis present

## 2020-02-21 DIAGNOSIS — K5730 Diverticulosis of large intestine without perforation or abscess without bleeding: Secondary | ICD-10-CM | POA: Diagnosis present

## 2020-02-21 DIAGNOSIS — I700 Atherosclerosis of aorta: Secondary | ICD-10-CM | POA: Diagnosis present

## 2020-02-21 DIAGNOSIS — R739 Hyperglycemia, unspecified: Secondary | ICD-10-CM | POA: Diagnosis present

## 2020-02-21 DIAGNOSIS — E872 Acidosis: Secondary | ICD-10-CM | POA: Diagnosis present

## 2020-02-21 DIAGNOSIS — Z8719 Personal history of other diseases of the digestive system: Secondary | ICD-10-CM

## 2020-02-21 DIAGNOSIS — N179 Acute kidney failure, unspecified: Secondary | ICD-10-CM

## 2020-02-21 DIAGNOSIS — K222 Esophageal obstruction: Secondary | ICD-10-CM | POA: Diagnosis present

## 2020-02-21 DIAGNOSIS — T451X5A Adverse effect of antineoplastic and immunosuppressive drugs, initial encounter: Secondary | ICD-10-CM | POA: Diagnosis present

## 2020-02-21 DIAGNOSIS — R0603 Acute respiratory distress: Secondary | ICD-10-CM

## 2020-02-21 DIAGNOSIS — Z79899 Other long term (current) drug therapy: Secondary | ICD-10-CM

## 2020-02-21 DIAGNOSIS — J90 Pleural effusion, not elsewhere classified: Secondary | ICD-10-CM

## 2020-02-21 DIAGNOSIS — Z9889 Other specified postprocedural states: Secondary | ICD-10-CM

## 2020-02-21 DIAGNOSIS — M342 Systemic sclerosis induced by drug and chemical: Secondary | ICD-10-CM | POA: Diagnosis present

## 2020-02-21 DIAGNOSIS — G119 Hereditary ataxia, unspecified: Secondary | ICD-10-CM | POA: Diagnosis present

## 2020-02-21 DIAGNOSIS — T17928A Food in respiratory tract, part unspecified causing other injury, initial encounter: Secondary | ICD-10-CM | POA: Diagnosis present

## 2020-02-21 DIAGNOSIS — N400 Enlarged prostate without lower urinary tract symptoms: Secondary | ICD-10-CM | POA: Diagnosis present

## 2020-02-21 DIAGNOSIS — R578 Other shock: Secondary | ICD-10-CM | POA: Diagnosis present

## 2020-02-21 DIAGNOSIS — Z87820 Personal history of traumatic brain injury: Secondary | ICD-10-CM

## 2020-02-21 DIAGNOSIS — E871 Hypo-osmolality and hyponatremia: Secondary | ICD-10-CM | POA: Diagnosis present

## 2020-02-21 DIAGNOSIS — K4020 Bilateral inguinal hernia, without obstruction or gangrene, not specified as recurrent: Secondary | ICD-10-CM | POA: Diagnosis present

## 2020-02-21 DIAGNOSIS — Z9221 Personal history of antineoplastic chemotherapy: Secondary | ICD-10-CM

## 2020-02-21 DIAGNOSIS — D631 Anemia in chronic kidney disease: Secondary | ICD-10-CM | POA: Diagnosis present

## 2020-02-21 DIAGNOSIS — R54 Age-related physical debility: Secondary | ICD-10-CM | POA: Diagnosis present

## 2020-02-21 DIAGNOSIS — Z87891 Personal history of nicotine dependence: Secondary | ICD-10-CM

## 2020-02-21 DIAGNOSIS — R9389 Abnormal findings on diagnostic imaging of other specified body structures: Secondary | ICD-10-CM | POA: Diagnosis present

## 2020-02-21 DIAGNOSIS — M1990 Unspecified osteoarthritis, unspecified site: Secondary | ICD-10-CM | POA: Diagnosis present

## 2020-02-21 DIAGNOSIS — D689 Coagulation defect, unspecified: Secondary | ICD-10-CM | POA: Diagnosis present

## 2020-02-21 DIAGNOSIS — J9602 Acute respiratory failure with hypercapnia: Secondary | ICD-10-CM | POA: Diagnosis present

## 2020-02-21 DIAGNOSIS — I083 Combined rheumatic disorders of mitral, aortic and tricuspid valves: Secondary | ICD-10-CM | POA: Diagnosis present

## 2020-02-21 DIAGNOSIS — Z8249 Family history of ischemic heart disease and other diseases of the circulatory system: Secondary | ICD-10-CM

## 2020-02-21 DIAGNOSIS — K7460 Unspecified cirrhosis of liver: Secondary | ICD-10-CM | POA: Diagnosis present

## 2020-02-21 DIAGNOSIS — E785 Hyperlipidemia, unspecified: Secondary | ICD-10-CM | POA: Diagnosis not present

## 2020-02-21 DIAGNOSIS — Z66 Do not resuscitate: Secondary | ICD-10-CM | POA: Diagnosis present

## 2020-02-21 DIAGNOSIS — Z801 Family history of malignant neoplasm of trachea, bronchus and lung: Secondary | ICD-10-CM

## 2020-02-21 DIAGNOSIS — E43 Unspecified severe protein-calorie malnutrition: Secondary | ICD-10-CM | POA: Diagnosis present

## 2020-02-21 DIAGNOSIS — R161 Splenomegaly, not elsewhere classified: Secondary | ICD-10-CM | POA: Diagnosis present

## 2020-02-21 DIAGNOSIS — K2970 Gastritis, unspecified, without bleeding: Secondary | ICD-10-CM | POA: Diagnosis present

## 2020-02-21 DIAGNOSIS — N529 Male erectile dysfunction, unspecified: Secondary | ICD-10-CM | POA: Diagnosis present

## 2020-02-21 DIAGNOSIS — E7800 Pure hypercholesterolemia, unspecified: Secondary | ICD-10-CM | POA: Diagnosis present

## 2020-02-21 DIAGNOSIS — R000 Tachycardia, unspecified: Secondary | ICD-10-CM | POA: Diagnosis present

## 2020-02-21 DIAGNOSIS — K219 Gastro-esophageal reflux disease without esophagitis: Secondary | ICD-10-CM | POA: Diagnosis present

## 2020-02-21 DIAGNOSIS — G20 Parkinson's disease: Secondary | ICD-10-CM | POA: Diagnosis present

## 2020-02-21 DIAGNOSIS — R64 Cachexia: Secondary | ICD-10-CM | POA: Diagnosis present

## 2020-02-21 DIAGNOSIS — K649 Unspecified hemorrhoids: Secondary | ICD-10-CM | POA: Diagnosis present

## 2020-02-21 LAB — COMPREHENSIVE METABOLIC PANEL
ALT: 22 U/L (ref 0–44)
AST: 26 U/L (ref 15–41)
Albumin: 3.6 g/dL (ref 3.5–5.0)
Alkaline Phosphatase: 123 U/L (ref 38–126)
Anion gap: 11 (ref 5–15)
BUN: 32 mg/dL — ABNORMAL HIGH (ref 8–23)
CO2: 24 mmol/L (ref 22–32)
Calcium: 9.8 mg/dL (ref 8.9–10.3)
Chloride: 96 mmol/L — ABNORMAL LOW (ref 98–111)
Creatinine, Ser: 1.49 mg/dL — ABNORMAL HIGH (ref 0.61–1.24)
GFR, Estimated: 48 mL/min — ABNORMAL LOW (ref >60)
Glucose, Bld: 147 mg/dL — ABNORMAL HIGH (ref 70–99)
Potassium: 5.5 mmol/L — ABNORMAL HIGH (ref 3.5–5.1)
Sodium: 131 mmol/L — ABNORMAL LOW (ref 135–145)
Total Bilirubin: 2.4 mg/dL — ABNORMAL HIGH (ref 0.3–1.2)
Total Protein: 8 g/dL (ref 6.5–8.1)

## 2020-02-21 LAB — BLOOD GAS, ARTERIAL
Acid-base deficit: 2.4 mmol/L — ABNORMAL HIGH (ref 0.0–2.0)
Bicarbonate: 20.6 mmol/L (ref 20.0–28.0)
Delivery systems: POSITIVE
Drawn by: 25788
Expiratory PAP: 7
FIO2: 50
Inspiratory PAP: 12
Mode: POSITIVE
O2 Saturation: 91.2 %
Patient temperature: 97.9
pCO2 arterial: 31.5 mmHg — ABNORMAL LOW (ref 32.0–48.0)
pH, Arterial: 7.428 (ref 7.350–7.450)
pO2, Arterial: 59.9 mmHg — ABNORMAL LOW (ref 83.0–108.0)

## 2020-02-21 LAB — BLOOD GAS, VENOUS
Acid-base deficit: 6.4 mmol/L — ABNORMAL HIGH (ref 0.0–2.0)
Bicarbonate: 22.4 mmol/L (ref 20.0–28.0)
FIO2: 21
O2 Saturation: 47.1 %
Patient temperature: 37
pCO2, Ven: 57.2 mmHg (ref 44.0–60.0)
pH, Ven: 7.217 — ABNORMAL LOW (ref 7.250–7.430)
pO2, Ven: 32.4 mmHg (ref 32.0–45.0)

## 2020-02-21 LAB — CBC
HCT: 46.1 % (ref 39.0–52.0)
Hemoglobin: 15.8 g/dL (ref 13.0–17.0)
MCH: 31 pg (ref 26.0–34.0)
MCHC: 34.3 g/dL (ref 30.0–36.0)
MCV: 90.4 fL (ref 80.0–100.0)
Platelets: 177 10*3/uL (ref 150–400)
RBC: 5.1 MIL/uL (ref 4.22–5.81)
RDW: 14.2 % (ref 11.5–15.5)
WBC: 9.2 10*3/uL (ref 4.0–10.5)
nRBC: 0 % (ref 0.0–0.2)

## 2020-02-21 LAB — BRAIN NATRIURETIC PEPTIDE: B Natriuretic Peptide: 117.5 pg/mL — ABNORMAL HIGH (ref 0.0–100.0)

## 2020-02-21 LAB — AMMONIA: Ammonia: 26 umol/L (ref 9–35)

## 2020-02-21 LAB — RESP PANEL BY RT-PCR (FLU A&B, COVID) ARPGX2
Influenza A by PCR: NEGATIVE
Influenza B by PCR: NEGATIVE
SARS Coronavirus 2 by RT PCR: NEGATIVE

## 2020-02-21 LAB — TROPONIN I (HIGH SENSITIVITY)
Troponin I (High Sensitivity): 6 ng/L (ref <18)
Troponin I (High Sensitivity): 11 ng/L (ref <18)

## 2020-02-21 LAB — LIPASE, BLOOD: Lipase: 130 U/L — ABNORMAL HIGH (ref 11–51)

## 2020-02-21 MED ORDER — SODIUM CHLORIDE 0.9 % IV SOLN
1.0000 g | INTRAVENOUS | Status: DC
  Administered 2020-02-21 – 2020-02-24 (×4): 1 g via INTRAVENOUS
  Filled 2020-02-21 (×5): qty 10

## 2020-02-21 MED ORDER — ONDANSETRON HCL 4 MG/2ML IJ SOLN
4.0000 mg | Freq: Once | INTRAMUSCULAR | Status: AC
  Administered 2020-02-21: 4 mg via INTRAVENOUS
  Filled 2020-02-21: qty 2

## 2020-02-21 MED ORDER — ONDANSETRON HCL 4 MG PO TABS: 4.0000 mg | ORAL_TABLET | Freq: Four times a day (QID) | ORAL | Status: DC | PRN

## 2020-02-21 MED ORDER — SODIUM CHLORIDE 0.9% FLUSH
3.0000 mL | Freq: Two times a day (BID) | INTRAVENOUS | Status: DC
  Administered 2020-02-21 – 2020-02-25 (×7): 3 mL via INTRAVENOUS

## 2020-02-21 MED ORDER — DILTIAZEM HCL 25 MG/5ML IV SOLN
10.0000 mg | Freq: Once | INTRAVENOUS | Status: AC
  Administered 2020-02-21: 10 mg via INTRAVENOUS
  Filled 2020-02-21: qty 5

## 2020-02-21 MED ORDER — PHENYLEPHRINE HCL-NACL 10-0.9 MG/250ML-% IV SOLN
25.0000 ug/min | INTRAVENOUS | Status: DC
  Administered 2020-02-21: 25 ug/min via INTRAVENOUS
  Administered 2020-02-21 (×2): 100 ug/min via INTRAVENOUS
  Administered 2020-02-22: 70 ug/min via INTRAVENOUS
  Administered 2020-02-22: 80 ug/min via INTRAVENOUS
  Administered 2020-02-22: 100 ug/min via INTRAVENOUS
  Administered 2020-02-22: 60 ug/min via INTRAVENOUS
  Administered 2020-02-22 (×2): 100 ug/min via INTRAVENOUS
  Administered 2020-02-22: 90 ug/min via INTRAVENOUS
  Administered 2020-02-22 (×5): 100 ug/min via INTRAVENOUS
  Filled 2020-02-21 (×22): qty 250

## 2020-02-21 MED ORDER — HEPARIN SODIUM (PORCINE) 5000 UNIT/ML IJ SOLN
5000.0000 [IU] | Freq: Three times a day (TID) | INTRAMUSCULAR | Status: DC
  Administered 2020-02-21 – 2020-02-25 (×11): 5000 [IU] via SUBCUTANEOUS
  Filled 2020-02-21 (×11): qty 1

## 2020-02-21 MED ORDER — DILTIAZEM HCL-DEXTROSE 125-5 MG/125ML-% IV SOLN (PREMIX)
5.0000 mg/h | INTRAVENOUS | Status: DC
  Administered 2020-02-21: 5 mg/h via INTRAVENOUS
  Filled 2020-02-21 (×2): qty 125

## 2020-02-21 MED ORDER — FENTANYL CITRATE (PF) 100 MCG/2ML IJ SOLN
50.0000 ug | Freq: Once | INTRAMUSCULAR | Status: AC
  Administered 2020-02-21: 50 ug via INTRAVENOUS
  Filled 2020-02-21: qty 2

## 2020-02-21 MED ORDER — SODIUM CHLORIDE 0.9 % IV SOLN
250.0000 mL | INTRAVENOUS | Status: DC
  Administered 2020-02-21: 250 mL via INTRAVENOUS

## 2020-02-21 MED ORDER — SODIUM CHLORIDE 0.9 % IV SOLN
3.0000 g | Freq: Once | INTRAVENOUS | Status: AC
  Administered 2020-02-21: 3 g via INTRAVENOUS
  Filled 2020-02-21: qty 8

## 2020-02-21 MED ORDER — LIDOCAINE HCL 1 % IJ SOLN
INTRAMUSCULAR | Status: AC
  Filled 2020-02-21: qty 20

## 2020-02-21 MED ORDER — ONDANSETRON HCL 4 MG/2ML IJ SOLN: 4.0000 mg | Freq: Four times a day (QID) | INTRAMUSCULAR | Status: DC | PRN

## 2020-02-21 MED ORDER — SODIUM CHLORIDE 0.9 % IV SOLN
100.0000 mg | Freq: Two times a day (BID) | INTRAVENOUS | Status: DC
  Administered 2020-02-21 – 2020-02-25 (×7): 100 mg via INTRAVENOUS
  Filled 2020-02-21 (×12): qty 100

## 2020-02-21 MED ORDER — LORAZEPAM 2 MG/ML IJ SOLN
1.0000 mg | Freq: Once | INTRAMUSCULAR | Status: AC
  Administered 2020-02-21: 1 mg via INTRAVENOUS
  Filled 2020-02-21: qty 1

## 2020-02-21 NOTE — Progress Notes (Addendum)
TOC CM/CSW received a call from [facility] ([phone]) [phone] in regards to pts dc status.  CSW updated [place].  Giuliana Handyside Tarpley-Carter, MSW, LCSW-A Pronouns:  She, Her, Hers                  [facility] Transitions of CareClinical Social Worker [email or web address] [phone]

## 2020-02-21 NOTE — ED Provider Notes (Addendum)
[facility] DEPT Provider Note   CSN: [id number] Arrival date & time: 02/09/2020  0112     History Chief Complaint  Patient presents with  . Hernia    [name] is a 80 y.o. male.  80 yo M with a chief complaints of left groin pain. Patient has a hernia there and its been stuck out since about lunchtime. Patient has had some pain to the area and feels like his abdomen is moving lots of gas but has not passed any gas recently. Has had nausea but not vomiting. No fevers.  The history is provided by the patient.  Illness Severity:  Moderate Onset quality:  Gradual Duration:  12 hours Timing:  Constant Progression:  Worsening Chronicity:  New Associated symptoms: abdominal pain   Associated symptoms: no chest pain, no congestion, no diarrhea, no fever, no headaches, no myalgias, no rash, no shortness of breath and no vomiting        Past Medical History:  Diagnosis Date  . Anemia   . Aortic valve disorder 07/17/2018  . Arthritis    "thumbs" (11/29/2013)  . Atrial fibrillation [facility]) 2006   Dr Tonny Bollman af-no meds  . Bleeding esophageal varices ([place])   . BPH (benign prostatic hypertrophy)   . CAD (coronary artery disease)    Mild by Cath 2004. Calcification with mild irregularity.  30% diagonal.  Cardiolite 8/13 normal EF and negative for ischemia (chronically positive ETT)   . CAP (community acquired pneumonia) 02/12/2013  . 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 Disorder of bilirubin excretion   . Diverticulosis    Sigmoid colon  . 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 Esophageal stricture   . Esophageal varices determined by endoscopy ([place]) sept 2016  . Fibrosis of liver   . Fracture of distal femur ([place]) 11/29/2013  . GERD (gastroesophageal reflux disease) 03-31-11   tx. Omeprazole  . 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 Hernia, abdominal    at the umbilicus  . History of  colon cancer 02/25/2016  . History of esophageal varices   . History of hemorrhoids   . History of irregular heartbeat   . Hypercholesterolemia   . HYPERGLYCEMIA, FASTING 12/13/2007  . Hyperlipidemia   . Malignant neoplasm of ascending colon ([place]) 03/03/2011   s/p right colectomy 04/08/11 Dr. Barkley Bruns Genetic counseling recommendations:  SURVEILLANCE:  Because of the increased risk for cancer in you and your family which we cannot yet define through genetic testing, we urge you and family members to pursue screenings that are recommended for individuals at increased risk of Lynch syndrome (HNPCC)-associated cancers. The following measures are recommend  . Maxillary sinusitis, acute 05/06/2015  . 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 Nephrolithiasis   . Neuropathy 2013   as a result of chemo  . Obstructive sleep apnea 12/14/2007  . OSA on CPAP dx'd 2010   uses cap nightly  . Oxygen deficiency   . Parkinsonism ([place]) 10/24/2017  . Personal history of colonic polyps 09/17/2009   Tubular adenoma  . Pneumonia 01/2013  . 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 Portal hypertension with esophageal varices ([place]) 10/24/2017  . Resting tremor 10/24/2017  . Shortness of breath at rest   . Sleep apnea    uses c pap at home  . TBI (traumatic brain injury) ([place]) 1986   S/P  fall; "slow to get going q morning" (10//22/2015)  . Thrombocytopenia ([place]) 02/25/2016  . Titubation 10/24/2017  . Upper GI bleed 02/25/2016  . Viral URI with cough 01/24/2013    Patient Active Problem List   Diagnosis Date Noted  . Protein-calorie malnutrition, severe 02/13/2020  . Abnormal MRI, liver   . Pressure injury of skin 02/11/2020  . Pleural effusion 02/10/2020  . Pleural effusion on right 02/09/2020  . Sleep apnea   . Portal hypertension ([place])   . Oxygen deficiency   . Hypercholesterolemia   . History  of irregular heartbeat   . History of hemorrhoids   . Hernia, abdominal   . Anemia   . Arthritis   . Diverticulosis   . Esophageal stricture   . OSA on CPAP   . Chemotherapy-induced neutropenia ([place]) 08/16/2018  . 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 Aortic valve disorder 07/17/2018  . Parkinsonism ([place]) 10/24/2017  . Portal hypertension with esophageal varices (HCC) 10/24/2017  . Resting tremor 10/24/2017  . Titubation 10/24/2017  . 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 Bleeding esophageal varices ([place])   . Acute GI bleeding   . Shortness of breath at rest   . Upper GI bleed 02/25/2016  . History of colon cancer 02/25/2016  . Thrombocytopenia ([place]) 02/25/2016  . History of esophageal varices   . Maxillary sinusitis, acute 05/06/2015  . Fibrosis of liver   . Esophageal varices determined by endoscopy ([place]) 10/2014  . 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 Fracture of distal femur ([place]) 11/29/2013  . CAP (community acquired pneumonia) 02/12/2013  . Viral URI with cough 01/24/2013  . Pneumonia 01/2013  . CAD (coronary artery disease)   . Nephrolithiasis   . GERD (gastroesophageal reflux disease) 03/31/2011  . Malignant neoplasm of ascending colon ([place]) 03/03/2011  . Neuropathy 2013  . Personal history of colonic polyps 09/17/2009  . 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 Disorder of bilirubin excretion   . Obstructive sleep apnea 12/14/2007  . HYPERGLYCEMIA, FASTING 12/13/2007  . Atrial fibrillation ([place]) 10/03/2007  . Hyperlipidemia 09/22/2006  . 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 BPH (benign prostatic hypertrophy)   . TBI (traumatic brain injury) ([place]) 1986    Past Surgical History:  Procedure Laterality Date  . APPENDECTOMY  03/2011  . CARDIAC CATHETERIZATION  07/2002   Dr Wynonia Lawman  . COLON SURGERY  2013  . COLONOSCOPY  sept 2016   upper endo and colonoscopy  . ESOPHAGEAL  DILATION  2006  . ESOPHAGOGASTRODUODENOSCOPY (EGD) WITH PROPOFOL N/A 02/25/2016   Procedure: ESOPHAGOGASTRODUODENOSCOPY (EGD) WITH PROPOFOL;  Surgeon: Manus Gunning, MD;  Location: [facility];  Service: Gastroenterology;  Laterality: N/A;  . ESOPHAGOGASTRODUODENOSCOPY (EGD) WITH PROPOFOL N/A 03/10/2016   Procedure: ESOPHAGOGASTRODUODENOSCOPY (EGD) WITH PROPOFOL;  Surgeon: Manus Gunning, MD;  Location: [facility];  Service: Gastroenterology;  Laterality: N/A;  . GASTRIC VARICES BANDING N/A 03/10/2016   Procedure: GASTRIC VARICES BANDING;  Surgeon: Manus Gunning, MD;  Location: [facility];  Service: Gastroenterology;  Laterality: N/A;  . INGUINAL HERNIA REPAIR Left ~ 1991  . LAPAROSCOPIC PARTIAL COLECTOMY  04/08/2011  . ORIF DISTAL FEMUR FRACTURE Right 11/29/2013   "put plate in"  . ORIF FEMUR FRACTURE Right 11/29/2013   Procedure: OPEN REDUCTION INTERNAL FIXATION (ORIF) RIGHT  DISTAL FEMUR FRACTURE;  Surgeon: Rozanna Box, MD;  Location: [facility];  Service: Orthopedics;  Laterality: Right;  . PORT-A-CATH REMOVAL  11/09/2011   Procedure:  REMOVAL PORT-A-CATH;  Surgeon: Odis Hollingshead, MD;  Location: [facility];  Service: General;  Laterality: N/A;  . PORTACATH PLACEMENT  04/30/2011   Procedure: INSERTION PORT-A-CATH;  Surgeon: Odis Hollingshead, MD;  Location: [facility];  Service: General;  Laterality: Right;  . THORACENTESIS Right 02/11/2020   Procedure: THORACENTESIS;  Surgeon: Margaretha Seeds, MD;  Location: [facility];  Service: Pulmonary;  Laterality: Right;       Family History  Problem Relation Age of Onset  . Diabetes Father   . Prostate cancer Father   . Heart attack Father 69  . Heart failure Mother        Congestive heart failure  . Cancer Brother        colon  . Colon cancer Brother 52  . Stomach cancer Sister   . Colon cancer Other   . Colon cancer Sister   . Coronary artery disease Brother        CABG  . Lung cancer Brother    . Anesthesia problems Neg Hx   . Esophageal cancer Neg Hx     Social History   Tobacco Use  . Smoking status: Former Smoker    Packs/day: 0.50    Years: 7.00    Pack years: 3.50    Types: Cigarettes    Quit date: 03/21/1961    Years since quitting: 58.9  . Smokeless tobacco: Never Used  Vaping Use  . Vaping Use: Never used  Substance Use Topics  . Alcohol use: No    Alcohol/week: 0.0 standard drinks  . Drug use: No    Home Medications Prior to Admission medications   Medication Sig Start Date End Date Taking? Authorizing Provider  acetaminophen (TYLENOL) 500 MG tablet Take 1,000 mg by mouth every 6 (six) hours as needed for mild pain or headache.    [provider]  amphetamine-dextroamphetamine (ADDERALL) 30 MG tablet Take 30 mg by mouth daily as needed (for awake in the morning). 12/30/10   [provider]  Artificial Tear Ointment (DRY EYES OP) Apply 1 drop to eye daily.    [provider]  carbidopa-levodopa (SINEMET CR) 50-200 MG tablet TAKE 1 TABLET BY MOUTH TWICE DAILY 1 HOUR BEFORE BREAKFAST AND BEFORE DINNER Patient taking differently: Take 1 tablet by mouth 2 (two) times daily. 01/01/20   Dohmeier, Asencion Partridge, MD  ferrous sulfate 325 (65 FE) MG tablet Take 325 mg by mouth 2 (two) times daily.    [provider]  fluticasone (FLONASE) 50 MCG/ACT nasal spray Place 2 sprays into both nostrils at bedtime as needed for allergies or rhinitis.    [provider]  furosemide (LASIX) 40 MG tablet Take 40 mg by mouth daily.     [provider]  midodrine (PROAMATINE) 5 MG tablet Take 1 tablet (5 mg total) by mouth 3 (three) times daily with meals. 02/16/20 03/17/20  Darliss Cheney, MD  pantoprazole (PROTONIX) 40 MG tablet Take 1 tablet (40 mg total) by mouth 2 (two) times daily. 02/29/16   Donne Hazel, MD  propranolol (INDERAL) 20 MG tablet Take 1 tablet (20 mg total) by mouth 2 (two) times daily. 02/29/16   Donne Hazel, MD   spironolactone (ALDACTONE) 100 MG tablet Take 100 mg by mouth daily.  07/18/18   [provider]    Allergies    Patient has no known allergies.  Review of Systems   Review of Systems  Constitutional: Negative for chills and fever.  HENT:  Negative for congestion and facial swelling.   Eyes: Negative for discharge and visual disturbance.  Respiratory: Negative for shortness of breath.   Cardiovascular: Negative for chest pain and palpitations.  Gastrointestinal: Positive for abdominal pain. Negative for diarrhea and vomiting.       Hernia   Musculoskeletal: Negative for arthralgias and myalgias.  Skin: Negative for color change and rash.  Neurological: Negative for tremors, syncope and headaches.  Psychiatric/Behavioral: Negative for confusion and dysphoric mood.    Physical Exam Updated Vital Signs BP 109/68   Pulse 88   Temp 97.7 F (36.5 C) (Oral)   Resp 16   Ht 6\' 2"  (1.88 m)   Wt 57.6 kg   SpO2 100%   BMI 16.31 kg/m   Physical Exam Vitals and nursing note reviewed.  Constitutional:      Appearance: He is well-developed and well-nourished.  HENT:     Head: Normocephalic and atraumatic.  Eyes:     Extraocular Movements: EOM normal.     Pupils: Pupils are equal, round, and reactive to light.  Neck:     Vascular: No JVD.  Cardiovascular:     Rate and Rhythm: Normal rate and regular rhythm.     Heart sounds: No murmur heard. No friction rub. No gallop.   Pulmonary:     Effort: No respiratory distress.     Breath sounds: No wheezing.  Abdominal:     General: There is no distension.     Tenderness: There is no guarding or rebound.  Genitourinary:   Musculoskeletal:        General: Normal range of motion.     Cervical back: Normal range of motion and neck supple.  Skin:    Coloration: Skin is not pale.     Findings: No rash.  Neurological:     Mental Status: He is alert and oriented to person, place, and time.  Psychiatric:        Mood and  Affect: Mood and affect normal.        Behavior: Behavior normal.     ED Results / Procedures / Treatments   Labs (all labs ordered are listed, but only abnormal results are displayed) Labs Reviewed  LIPASE, BLOOD - Abnormal; Notable for the following components:      Result Value   Lipase 130 (*)    All other components within normal limits  COMPREHENSIVE METABOLIC PANEL - Abnormal; Notable for the following components:   Sodium 131 (*)    Potassium 5.5 (*)    Chloride 96 (*)    Glucose, Bld 147 (*)    BUN 32 (*)    Creatinine, Ser 1.49 (*)    Total Bilirubin 2.4 (*)    GFR, Estimated 48 (*)    All other components within normal limits  CBC    EKG None  Radiology No results found.  Procedures Hernia reduction  Date/Time: 03/09/2020 5:59 AM Performed by: Deno Etienne, DO Authorized by: Deno Etienne, DO  Consent: Verbal consent obtained. Risks and benefits: risks, benefits and alternatives were discussed Consent given by: patient Patient understanding: patient states understanding of the procedure being performed Patient consent: the patient's understanding of the procedure matches consent given Required items: required blood products, implants, devices, and special equipment available Patient identity confirmed: verbally with patient Time out: Immediately prior to procedure a "time out" was called to verify the correct patient, procedure, equipment, support staff and site/side marked as required. Local anesthesia used: no  Anesthesia: Local  anesthesia used: no  Sedation: Patient sedated: no  Patient tolerance: patient tolerated the procedure well with no immediate complications    (including critical care time)  Medications Ordered in ED Medications  fentaNYL (SUBLIMAZE) injection 50 mcg (50 mcg Intravenous Given 02/27/2020 0425)  ondansetron (ZOFRAN) injection 4 mg (4 mg Intravenous Given 02/17/2020 0425)    ED Course  I have reviewed the triage vital signs and  the nursing notes.  Pertinent labs & imaging results that were available during my care of the patient were reviewed by me and considered in my medical decision making (see chart for details).    MDM Rules/Calculators/A&P                          80 yo M with a chief complaints of left groin pain. Patient has a left direct inguinal hernia. Initial attempt to reduce resulted in some nausea and vomiting. We will treat the patient's pain and nausea and reattempt.  Reduced at bedside.  Patient symptoms have resolved.  Has been able to tolerate p.o. here.  Patient's lipase is significantly elevated.  I'm not sure the exact reason for this seems unrelated to the symptoms he came in for.  No epigastric pain on repeat exam.  Tolerating by mouth.  Discharged home.  While awaiting discharge the patient had a episode where he choked on some water, vomiting and then choked on his vomiting.  Called into the room for dyspnea.  Hx of parkinson's with some baseline difficulty swallowing.  Give dose of abx.  CXR.     The patients results and plan were reviewed and discussed.   Any x-rays performed were independently reviewed by myself.   Differential diagnosis were considered with the presenting HPI.  Medications  fentaNYL (SUBLIMAZE) injection 50 mcg (50 mcg Intravenous Given 02/15/2020 0425)  ondansetron (ZOFRAN) injection 4 mg (4 mg Intravenous Given 02/29/2020 0425)    Vitals:   02/12/2020 0134 03/02/2020 0425 02/19/2020 0500 02/24/2020 0530  BP: 126/70 (!) 127/97 113/74 109/68  Pulse: 99 94 91 88  Resp: 16 18 16 16   Temp: 97.7 F (36.5 C)     TempSrc: Oral     SpO2: 97% 94% 94% 100%  Weight: 57.6 kg     Height: 6\' 2"  (1.88 m)       Final diagnoses:  Left inguinal hernia  Aspiration pneumonia of left lower lobe due to gastric secretions (HCC)     Medications given during this visit Medications  fentaNYL (SUBLIMAZE) injection 50 mcg (50 mcg Intravenous Given 03/07/2020 0425)  ondansetron (ZOFRAN)  injection 4 mg (4 mg Intravenous Given 02/17/2020 0425)     The patient appears reasonably screen and/or stabilized for discharge and I doubt any other medical condition or other [facility] requiring further screening, evaluation, or treatment in the ED at this time prior to discharge.   Final Clinical Impression(s) / ED Diagnoses Final diagnoses:  Left inguinal hernia  Aspiration pneumonia of left lower lobe due to gastric secretions [facility])    Rx / DC Orders ED Discharge Orders    None         Deno Etienne, DO 02/13/2020 [id number]

## 2020-02-21 NOTE — ED Notes (Signed)
Pt states he believes he may have aspirated when he vomited and c/o cough and SOB worsening since.

## 2020-02-21 NOTE — ED Notes (Signed)
BP cont trending down. MD at bedside.

## 2020-02-21 NOTE — ED Notes (Signed)
Pt's room air saturation found to be 81%, pt states he is still having trouble breathing. Pt placed on 6L oxygen and sats improved to 92%.

## 2020-02-21 NOTE — ED Notes (Signed)
Pt not responding to verbal stimuli. Responds to painful stimuli, but not making comprehensible sentences. MD at bedside.

## 2020-02-21 NOTE — Progress Notes (Signed)
Call from [facility] MD - patient now hypotensive  Plan  - start neo via PIV (patient is DNI and no CPR and no ACLS but ok for pressors)  - thora later by [place] - [facility] MD coordinating with family - GI consult  - mov eto ICU  - CCM can be primary 02/22/20 if patient still unstable and on pressors  - code status changed from full DNR to limited code - no cpr, no acls, no intubation but ok for pressors and ok for bipap and also full medicl care     SIGNATURE    Dr. Brand Males, M.D., F.C.C.P,  Pulmonary and Critical Care Medicine Staff Physician, [place] Director - Interstitial Lung Disease  Program  Pulmonary [place] [facility] [place], [place], [address]  Pager: [phone], If no answer  OR between  19:00-7:00h: page [phone]  [phone] Telephone (clinical office): [phone] Telephone (research): [phone]  2:06 PM 02/26/2020

## 2020-02-21 NOTE — Consult Note (Addendum)
Referring Provider: Dr. Marva Panda  Primary Care Physician:  Burnard Bunting, MD Primary Gastroenterologist:  Dr. Havery Moros   Reason for Consultation:  Recurrent hepatic hydrothorax, consideration for TIPS  HPI: [name] is a 80 y.o. 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 2020, GI bleed secondary to esophageal varices s/p  banding 02/2016. He has a history of cirrhotic appearing liver per [place] elastography with splenomegaly and thrombocytopenia.  He was previously evaluated at [facility] hepatology for noncirrhotic portal hypertension.  A liver biopsy 01/2015 was performed which was relatively unremarkable and appeared to show a normal liver.  It was thought he had Oxaliplatin induced sinusoidal sclerosis from prior colon cancer therapy. He was on Nadolol 40 mg daily for his esophageal varices. He has been followed by Dr. Jaynie Bream at [facility] in [place]. The above medical history was obtained from his Epic records as he is mildly sedated on Cpap at this time and verbal communication is limited. No family at the bedside.   He was admitted to the hospital on 02/09/2020 due to having right lower chest pain which was pleuritic in nature.  Labs in the ED showed a WBC 8.9.  Hemoglobin 12.8.  Platelet 149.  Sodium 138.  Potassium 4.5.  Creatinine 1.40.  A chest CTA showed a large right pleural effusion, most likely hepatic hydrothorax.  He underwent a thoracentesis 1/3 with rapid reaccumulation of fluid the next day.  He was evaluated by our GI service and he was started on diuretics  and future TIPs placement was considered.  He developed hypotension and was started on Midodrine.  His AKI and hyponatremia stabilized and his respiratory status improved. An ECHO showed LV EF 60-65 % with low normal RV function. He was discharged home on 02/16/2020 with plans to follow-up in our office in 2 weeks, appointment with Dr. Hilarie Fredrickson was scheduled on 03/03/2020 and he was referred to hepatologist Dr. Orvis Brill with [facility] in [place] and with IR for consideration for TIPS.  He was discharged home 02/16/2020 on furosemide 40 mg p.o. daily, spironolactone 100 mg p.o. daily, propanolol 20 mg twice daily, pantoprazole 40 mg twice daily, midodrine 5 mg p.o. 3 times daily and ferrous sulfate 325 mg p.o. twice daily.  He presented to [facility] ED due to having left groin pain was concerning for incarcerated hernia. The ED physician was able to reduce his hernia at the bedside his pain abated. While waiting to be discharged home, he drank water which resulted in choking and he vomited with persistent coughing with SOB. There was concern for possible aspiration.  His oxygen saturations were 81% and he was placed on 6 L nasal cannula.  A chest x-ray was done showed worsening opacity of what can be seen on the left lung, a large right pleural effusion.  Started on BiPAP and his respiratory status improved.  He became tachypneic and tachycardic.  He was found to be in atrial fibrillation with RVR and he was started on diltiazem with a bolus followed by an infusion.  COVID 19 negative. He received IV Unasyn, Zofran and Fentanyl. A GI consult was requested by Dr. Neysa Bonito for further consideration for TIPS with recurrent  suspected hepatic hydrothorax.   Currently, he is somewhat sedated after receiving Ativan.  He is hypotensive with his follow-up blood pressure running in the low 90s.  He remains in atrial fibrillation with a controlled HR.  He awakens when his name is called.  He is able to answer yes or no  questions but then drifts back to sleep.  He denies having any chest pain.  No current shortness of breath while he is on CPAP.  No family at the bedside.   Laboratory studies 02/17/2020: Sodium 131.  Potassium 5.5.  Glucose 147.  BUN 32.  Creatinine 1.49.  Anion gap 11.  Alk phos 123.  Albumin 3.6.  Lipase 130.  AST 26.  ALT 22.  Total bili 2.4.  BNP 117.5.  WBC 9.2.  Hemoglobin 15.8.  Hematocrit 46.1.  MCV 90.4.  Platelet 177.   Abdominal MRI with and without contrast 02/12/2020: Hepatic hydrothorax.  Diminutive, nearly occluded RIGHT portal branches with very diminutive hepatic venous branches tracking over the hypertrophied central RIGHT hepatic lobe and caudate.  Potential nonocclusive filling defect in main portal vein versus extrinsic compression.  Lenticular area in the pancreas is more likely to be extrinsic, correlate with any recent history of pancreatitis. Area is not well assessed in could also represent a small cystic pancreatic neoplasm but shows no enhancement or gross pancreatic ductal dilation.  Signs of advanced liver disease/cirrhosis with portal hypertension including varices and splenomegaly. Morphologic changes of the liver are more akin to what can be seen in the setting of [place] and are markedly worse than on the study from 2016 in terms of caudate hypertrophy. Would correlate with any primary biliary abnormalities or intervening history of cholangitis though findings remain nonspecific in the setting of chronic liver disease.  Marked narrowing of the renal vein as it crosses beneath the SMV. Patency in direct continuity with the IVC cannot be assessed though this is suspected to be a chronic finding given symmetry of renal enhancement and presence of lumbar collaterals.  Cholelithiasis with biliary sludge.  Past Endoscopic procedures: EGD 03/10/2016 Dr. Havery Moros: - Esophagogastric landmarks identified. - Esophageal varices with stigmata of recent  banding, improved in size compared to previous. Banded x 4 with deflation. - Portal hypertensive gastritis. - Normal duodenal bulb and second portion of the duodenum. - No specimens collected.  EGD 02/25/2016: - Esophagogastric landmarks identified. - Large esophageal varices, one with red spot. Banded x 7, I suspect variceal bleed is the most likely cause of the patient's symptoms - Clotted and red blood in the entire stomach, cleared as outlined above. - Portal hypertensive gastritis - Normal duodenal bulb and second portion of the duodenum.  Colonoscopy 11/07/2015: - One 8 mm polyp at the recto-sigmoid colon, removed with a hot snare. Resected and retrieved. - One 6 mm polyp in the sigmoid colon, removed with a cold snare. Resected and retrieved. - One 8 mm polyp at the splenic flexure, removed with a hot snare. Resected and retrieved. - End-to-side ileo-colonic anastomosis, characterized by nodularity. Biopsied. - Multiple 3 to 6 mm polyps at the anastomosis, 2 representative polyps removed with a hot snare. Resected and retrieved. - Diverticulosis in the left colon. - Non-bleeding internal hemorrhoids. - The examination was otherwise normal. - Small mucosal wrent at the rectum likely due to retroflexion, caused diminutive bleeding which has since stopped. -Recall colonoscopy in 1 year  Biopsy report: 1. Surgical [P], splenic flexure x 1, sigmoid x 1, recto-sigmoid x 1, polyps (3) -  TUBULAR ADENOMA (S) (MULTIPLE FRAGMENTS). - ULCERATED AND INFLAMED HYPERPLASTIC TYPE POLYP(S). - HIGH GRADE DYSPLASIA IS NOT IDENTIFIED. 2. Surgical [P], right colon anastamosis site polyps (2) - INFLAMED AND ULCERATED HYPERPLASTIC TYPE POLYP. - THERE IS NO EVIDENCE OF MALIGNANCY. 3. Surgical [P], right colon anastamosis site nodularity - REACTIVE AND HYPERPLASTIC APPEARING EPITHELIUM. - THERE IS NO EVIDENCE OF MALIGNANCY  ECHO 02/13/2020: 1. Very mild distal septal hypokinesis . Left  ventricular ejection fraction, by estimation, is 50 to 55%. The left ventricle has low normal function. Left ventricular diastolic parameters are indeterminate. 2. Right ventricular systolic function is low normal. The right ventricular size is normal. There is normal pulmonary artery systolic pressure. 3. Large R pleural effusion causes some indentation of RA. A trivial pericardial effusion is present. 4. Mild to moderate mitral valve regurgitation. 5. Tricuspid valve regurgitation is moderate. 6. The aortic valve is abnormal. Aortic valve regurgitation is mild. Mild to moderate aortic valve sclerosis/calcification is present, without any evidence of aortic stenosis. 7. The inferior vena cava is normal in size with greater than 50% respiratory variability, suggesting right atrial pressure of 3 mmHg.  Liver biopsy 01/10/2015: BENIGN LIVER WITHOUT HISTOLOGIC ABNORMALITY, SEE COMMENT  Past Medical History:  Diagnosis Date  . Anemia   . Aortic valve disorder 07/17/2018  . Arthritis    "thumbs" (11/29/2013)  . Atrial fibrillation [facility]) 2006   Dr Tonny Bollman af-no meds  . Bleeding esophageal varices ([place])   . BPH (benign prostatic hypertrophy)   . CAD (coronary artery disease)    Mild by Cath 2004. Calcification with mild irregularity.  30% diagonal.  Cardiolite 8/13 normal EF and negative for ischemia (chronically positive ETT)   . CAP (community acquired pneumonia) 02/12/2013  . Cerebellar ataxia in diseases classified elsewhere ([place]) 10/24/2017  . Disorder of bilirubin excretion   . Diverticulosis    Sigmoid colon  . 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 Esophageal stricture   . Esophageal varices determined by endoscopy ([place]) sept 2016  . Fibrosis of liver   . Fracture of distal femur ([place]) 11/29/2013  . GERD (gastroesophageal reflux disease) 03-31-11   tx. Omeprazole  . 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 Hernia, abdominal    at the umbilicus  .  History of colon cancer 02/25/2016  . History of esophageal varices   . History of hemorrhoids   . History of irregular heartbeat   . Hypercholesterolemia   . HYPERGLYCEMIA, FASTING 12/13/2007  . Hyperlipidemia   . Malignant neoplasm of ascending colon ([place]) 03/03/2011   s/p right colectomy 04/08/11 Dr. Barkley Bruns Genetic counseling recommendations:  SURVEILLANCE:  Because of the increased risk for cancer in you and your family which we cannot yet define through genetic testing, we urge you and family members to pursue screenings that are recommended for individuals at increased risk of Lynch syndrome (HNPCC)-associated cancers. The following measures are recommend  . Maxillary sinusitis, acute 05/06/2015  . 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 Nephrolithiasis   . Neuropathy 2013   as a result of chemo  . Obstructive sleep apnea 12/14/2007  . OSA on CPAP dx'd 2010   uses cap nightly  . Oxygen deficiency   . Parkinsonism ([place]) 10/24/2017  . Personal history of colonic polyps 09/17/2009   Tubular adenoma  . Pneumonia 01/2013  . 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 Portal hypertension with esophageal varices (  [place]) 10/24/2017  . Resting tremor 10/24/2017  . Shortness of breath at rest   . Sleep apnea    uses c pap at home  . TBI (traumatic brain injury) ([place]) 1986   S/P fall; "slow to get going q morning" (10//22/2015)  . Thrombocytopenia ([place]) 02/25/2016  . Titubation 10/24/2017  . Upper GI bleed 02/25/2016  . Viral URI with cough 01/24/2013    Past Surgical History:  Procedure Laterality Date  . APPENDECTOMY  03/2011  . CARDIAC CATHETERIZATION  07/2002   Dr Wynonia Lawman  . COLON SURGERY  2013  . COLONOSCOPY  sept 2016   upper endo and colonoscopy  . ESOPHAGEAL DILATION  2006  . ESOPHAGOGASTRODUODENOSCOPY (EGD) WITH PROPOFOL N/A 02/25/2016   Procedure:  ESOPHAGOGASTRODUODENOSCOPY (EGD) WITH PROPOFOL;  Surgeon: Manus Gunning, MD;  Location: [facility];  Service: Gastroenterology;  Laterality: N/A;  . ESOPHAGOGASTRODUODENOSCOPY (EGD) WITH PROPOFOL N/A 03/10/2016   Procedure: ESOPHAGOGASTRODUODENOSCOPY (EGD) WITH PROPOFOL;  Surgeon: Manus Gunning, MD;  Location: [facility];  Service: Gastroenterology;  Laterality: N/A;  . GASTRIC VARICES BANDING N/A 03/10/2016   Procedure: GASTRIC VARICES BANDING;  Surgeon: Manus Gunning, MD;  Location: [facility];  Service: Gastroenterology;  Laterality: N/A;  . INGUINAL HERNIA REPAIR Left ~ 1991  . LAPAROSCOPIC PARTIAL COLECTOMY  04/08/2011  . ORIF DISTAL FEMUR FRACTURE Right 11/29/2013   "put plate in"  . ORIF FEMUR FRACTURE Right 11/29/2013   Procedure: OPEN REDUCTION INTERNAL FIXATION (ORIF) RIGHT  DISTAL FEMUR FRACTURE;  Surgeon: Rozanna Box, MD;  Location: [place];  Service: Orthopedics;  Laterality: Right;  . PORT-A-CATH REMOVAL  11/09/2011   Procedure: REMOVAL PORT-A-CATH;  Surgeon: Odis Hollingshead, MD;  Location: [place];  Service: General;  Laterality: N/A;  . PORTACATH PLACEMENT  04/30/2011   Procedure: INSERTION PORT-A-CATH;  Surgeon: Odis Hollingshead, MD;  Location: [place];  Service: General;  Laterality: Right;  . THORACENTESIS Right 02/11/2020   Procedure: THORACENTESIS;  Surgeon: Margaretha Seeds, MD;  Location: [place];  Service: Pulmonary;  Laterality: Right;    Prior to Admission medications   Medication Sig Start Date End Date Taking? Authorizing Provider  acetaminophen (TYLENOL) 500 MG tablet Take 1,000 mg by mouth every 6 (six) hours as needed for mild pain or headache.    [provider]  amphetamine-dextroamphetamine (ADDERALL) 30 MG tablet Take 30 mg by mouth daily as needed (for awake in the morning). 12/30/10   [provider]  Artificial Tear Ointment (DRY EYES OP) Apply 1 drop to eye daily.    [provider]  carbidopa-levodopa (SINEMET CR) 50-200 MG tablet TAKE 1 TABLET BY MOUTH TWICE DAILY 1 HOUR BEFORE BREAKFAST AND BEFORE DINNER Patient taking differently: Take 1 tablet by mouth 2 (two) times daily. 01/01/20   Dohmeier, Asencion Partridge, MD  ferrous sulfate 325 (65 FE) MG tablet Take 325 mg by mouth 2 (two) times daily.    [provider]  fluticasone (FLONASE) 50 MCG/ACT nasal spray Place 2 sprays into both nostrils at bedtime as needed for allergies or rhinitis.    [provider]  furosemide (LASIX) 40 MG tablet Take 40 mg by mouth daily.     [provider]  ipratropium (ATROVENT) 0.06 % nasal spray Place into both nostrils. 02/19/20   [provider]  midodrine (PROAMATINE) 5 MG tablet Take 1 tablet (5 mg total) by mouth 3 (three) times daily with meals. 02/16/20 03/17/20  Darliss Cheney, MD  pantoprazole ([place]) 40  MG tablet Take 1 tablet (40 mg total) by mouth 2 (two) times daily. 02/29/16   Donne Hazel, MD  propranolol (INDERAL) 20 MG tablet Take 1 tablet (20 mg total) by mouth 2 (two) times daily. 02/29/16   Donne Hazel, MD  spironolactone (ALDACTONE) 100 MG tablet Take 100 mg by mouth daily.  07/18/18   [provider]    Current Facility-Administered Medications  Medication Dose Route Frequency Provider Last Rate Last Admin  . cefTRIAXone (ROCEPHIN) 1 g in sodium chloride 0.9 % 100 mL IVPB  1 g Intravenous Q24H Dontavis Hedge, MD   Stopped at 02/10/2020 1145  . diltiazem (CARDIZEM) 125 mg in dextrose 5% 125 mL (1 mg/mL) infusion  5-15 mg/hr Intravenous Continuous Leeman Hedge, MD 12.5 mL/hr at 02/24/2020 1041 12.5 mg/hr at 03/09/2020 1041  . doxycycline (VIBRAMYCIN) 100 mg in sodium chloride 0.9 % 250 mL IVPB  100 mg Intravenous Q12H [place] Hedge, MD 125 mL/hr at 03/07/2020 1148 100 mg at 02/15/2020 1148  . heparin injection 5,000 Units  5,000 Units Subcutaneous Q8H Penny Hedge, MD      . ondansetron [facility]) tablet 4 mg  4 mg Oral Q6H PRN  Camaron Hedge, MD       Or  . ondansetron [facility]) injection 4 mg  4 mg Intravenous Q6H PRN Joshwa Hedge, MD      . sodium chloride flush (NS) 0.9 % injection 3 mL  3 mL Intravenous Q12H Montey Hedge, MD   3 mL at 03/10/2020 1056   Current Outpatient Medications  Medication Sig Dispense Refill  . acetaminophen (TYLENOL) 500 MG tablet Take 1,000 mg by mouth every 6 (six) hours as needed for mild pain or headache.    . amphetamine-dextroamphetamine (ADDERALL) 30 MG tablet Take 30 mg by mouth daily as needed (for awake in the morning).    . Artificial Tear Ointment (DRY EYES OP) Apply 1 drop to eye daily.    . carbidopa-levodopa (SINEMET CR) 50-200 MG tablet TAKE 1 TABLET BY MOUTH TWICE DAILY 1 HOUR BEFORE BREAKFAST AND BEFORE DINNER (Patient taking differently: Take 1 tablet by mouth 2 (two) times daily.) 180 tablet 1  . ferrous sulfate 325 (65 FE) MG tablet Take 325 mg by mouth 2 (two) times daily.    . fluticasone (FLONASE) 50 MCG/ACT nasal spray Place 2 sprays into both nostrils at bedtime as needed for allergies or rhinitis.    . furosemide (LASIX) 40 MG tablet Take 40 mg by mouth daily.     Marland Kitchen ipratropium (ATROVENT) 0.06 % nasal spray Place into both nostrils.    . midodrine (PROAMATINE) 5 MG tablet Take 1 tablet (5 mg total) by mouth 3 (three) times daily with meals. 90 tablet 0  . pantoprazole (PROTONIX) 40 MG tablet Take 1 tablet (40 mg total) by mouth 2 (two) times daily. 60 tablet 0  . propranolol (INDERAL) 20 MG tablet Take 1 tablet (20 mg total) by mouth 2 (two) times daily. 60 tablet 0  . spironolactone (ALDACTONE) 100 MG tablet Take 100 mg by mouth daily.       Allergies as of 02/13/2020  . (No Known Allergies)    Family History  Problem Relation Age of Onset  . Diabetes Father   . Prostate cancer Father   . Heart attack Father 64  . Heart failure Mother        Congestive heart failure  . Cancer Brother  colon  . Colon cancer Brother 22  . Stomach cancer Sister    . Colon cancer Other   . Colon cancer Sister   . Coronary artery disease Brother        CABG  . Lung cancer Brother   . Anesthesia problems Neg Hx   . Esophageal cancer Neg Hx     Social History   Socioeconomic History  . Marital status: Married    Spouse name: Not on file  . Number of children: 2  . Years of education: Not on file  . Highest education level: Not on file  Occupational History  . Occupation: Retired  Tobacco Use  . Smoking status: Former Smoker    Packs/day: 0.50    Years: 7.00    Pack years: 3.50    Types: Cigarettes    Quit date: 03/21/1961    Years since quitting: 58.9  . Smokeless tobacco: Never Used  Vaping Use  . Vaping Use: Never used  Substance and Sexual Activity  . Alcohol use: No    Alcohol/week: 0.0 standard drinks  . Drug use: No  . Sexual activity: Yes  Other Topics Concern  . Not on file  Social History Narrative  . Not on file   Social Determinants of Health   Financial Resource Strain: Not on file  Food Insecurity: Not on file  Transportation Needs: Not on file  Physical Activity: Not on file  Stress: Not on file  Social Connections: Not on file  Intimate Partner Violence: Not on file    Review of Systems: Unable to obtain accurate review of systems as patient is mildly sedated on Bipap Physical Exam: Vital signs in last 24 hours: Temp:  [97.7 F (36.5 C)] 97.7 F (36.5 C) (01/13 0134) Pulse Rate:  [87-155] 123 (01/13 1045) Resp:  [16-32] 20 (01/13 1045) BP: (96-159)/(65-113) 96/65 (01/13 1045) SpO2:  [84 %-100 %] 98 % (01/13 1045) FiO2 (%):  [50 %-100 %] 50 % (01/13 0835) Weight:  [57.6 kg] 57.6 kg (01/13 0134)   General: Critically ill-appearing 80 year old male Head:  Normocephalic and atraumatic. Eyes:  No scleral icterus. Conjunctiva pink. Ears:  Normal auditory acuity. Nose:  No deformity, discharge or lesions. Mouth: Poor dentition.  Dry mouth.  No ulcers or lesions.  Pap and use. Neck:  Supple. No  lymphadenopathy or thyromegaly.  Lungs: Left lung fields are clear, right lung fields significantly diminished down to the base.  No crepitus Heart: Regular rhythm.  No murmur Abdomen: Soft, nondistended, umbilical hernia.  No obvious ascites.  Hypoactive bowel sounds all 4 quadrants. Rectal: Deferred. Musculoskeletal:  Symmetrical without gross deformities.  Pulses:  Normal pulses noted. Extremities:  Without clubbing or edema. Neurologic: Awakens when name called.  Answers yes and no to questions appropriately.  Moves all extremities weakly. Skin:  Intact without significant lesions or rashes. Psych: Lethargic.  Intake/Output from previous day: No intake/output data recorded. Intake/Output this shift: Total I/O In: 200 [IV Piggyback:200] Out: -   Lab Results: Recent Labs    02/26/2020 0420  WBC 9.2  HGB 15.8  HCT 46.1  PLT 177   BMET Recent Labs    02/09/2020 0420  NA 131*  K 5.5*  CL 96*  CO2 24  GLUCOSE 147*  BUN 32*  CREATININE 1.49*  CALCIUM 9.8   LFT Recent Labs    02/09/2020 0420  PROT 8.0  ALBUMIN 3.6  AST 26  ALT 22  ALKPHOS 123  BILITOT 2.4*   PT/INR  No results for input(s): LABPROT, INR in the last 72 hours. Hepatitis Panel No results for input(s): HEPBSAG, HCVAB, HEPAIGM, HEPBIGM in the last 72 hours.    Studies/Results: DG Chest Port 1 View  Result Date: 02/20/2020 CLINICAL DATA:  Respiratory distress EXAM: PORTABLE CHEST 1 VIEW COMPARISON:  February 21, 2020 study obtained earlier in the day FINDINGS: There is persistent large right pleural effusion with areas of suspected atelectasis and potential superimposed consolidation on the right. There is ill-defined airspace opacity throughout the left mid and lower lung regions. The heart is upper normal in size with pulmonary vascularity grossly normal. No adenopathy. There is aortic atherosclerosis. No bone lesions. No pneumothorax. IMPRESSION: Ill-defined airspace opacity left mid and lower lung  regions appears slightly more pronounced than on study obtained earlier in the day. Large right pleural effusion again noted with suspected atelectasis and/or consolidation on the right. Stable cardiac silhouette. Aortic Atherosclerosis (ICD10-I70.0). Electronically Signed   By: Lowella Grip III M.D.   On: 03/04/2020 08:06   DG Chest Port 1 View  Result Date: 02/13/2020 CLINICAL DATA:  80 year old male with shortness of breath. Large right pleural effusion. Cirrhosis. EXAM: PORTABLE CHEST 1 VIEW COMPARISON:  Portable chest 02/15/2020, chest CTA 02/09/2020 and earlier. FINDINGS: Portable AP semi upright view at 0709 hours. The patient remains mildly rotated to the right with mildly increased kyphotic positioning. Subtotal opacification of the right hemithorax, sparing the right lung apex, not significantly changed when allowing for differences in patient position from the CTA on January 1st. Visible mediastinal contours and left lung ventilation appears stable. No pneumothorax. IMPRESSION: 1. Large right pleural effusion not significantly changed from the CTA on 02/09/2020. 2. No new cardiopulmonary abnormality. Electronically Signed   By: Genevie Ann M.D.   On: 02/27/2020 07:21    IMPRESSION/PLAN:  7.  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 Admitted to the hospital 1/1-02/16/2020 secondary to a right pleural effusion suspected hepatic hydrothorax.  Plan was for the patient to follow-up with hepatologist Dr. Orvis Brill with [facility] and IR for consideration for TIPS.  He presented to the ED earlier this morning with left groin pain secondary to an inguinal hernia which was reduced by the ED physician. Prior to being discharged home, he drank water which resulted in vomiting, coughing and SOB concerning for aspiration.   Chest x-ray showed persistent right pleural effusion. He required Bipap.  -Supportive care -He will  Most likely require admission into the ICU -Defer decision regarding repeat thoracentesis to the medical/CCM team. If a thoracentesis is repeated check pleural fluid albumin, protein, LDH and cell count with diff and culture -Hold diuretics as he is hypotensive in the ED -Further recommendations per Dr. Bryan Lemma   2.  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 Atrial fibrillation with RVR on Diltiazem IV with SBP in the low 90's and HR now in the 80's.   4. History of Parkinson's disease. Questionable dysphagia.    Patrecia Pour Kennedy-Smith  02/09/2020, 2:15pm

## 2020-02-21 NOTE — ED Notes (Signed)
Pt BP down from prior, cont to trend down. MD Neysa Bonito made aware. Diltiazem to be titrated down.

## 2020-02-21 NOTE — Telephone Encounter (Signed)
On call note:  Patient called last night around midnight that he is experiencing abdominal pain, he has chronic umbilical, bilateral inguinal hernias and one of the inguinal hernia is bulging out,  unable to reduce it.  He is not passing any flatus. Advised patient to come to ER for evaluation, concern for obstructed/incarcerated hernia.  Damaris Hippo , MD [phone]

## 2020-02-21 NOTE — ED Notes (Signed)
Spoke with son he is updated and aware of how his father is doing.

## 2020-02-21 NOTE — ED Provider Notes (Signed)
Patient turned over to me.  Patient ran into worse respiratory distress also became tachycardia seem to be atrial fib.  Patient's had atrial fib in the past.  Does appear that he is normally on a beta-blocker do not not know if it was for that reason.  But patient's oxygen sats down into the 70s on 2 L.  He started on 100% nonrebreather which brought oxygen sats up into the low 90 percentile range.  But he is working hard to breathe.  Respiratory consulted.  Patient started on BiPAP.  Repeat portable chest x-ray shows worsening opacity of what can be seen of the left lung.  Still has large pleural effusion on the right.  That was pre-existing.  It is presumed that patient had an aspiration pneumonia.  Patient started on BiPAP for this now doing much better more comfortable.  Venous blood gas reassuring.  Also patient started on diltiazem.  Was originally given 10 mg bolus which brought his heart rate down into the low 100s.  Now has been started on a drip.  He is maintaining good blood pressures.  COVID testing is pending.  CRITICAL CARE Performed by: Fredia Sorrow Total critical care time: 60 minutes Critical care time was exclusive of separately billable procedures and treating other patients. Critical care was necessary to treat or prevent imminent or life-threatening deterioration. Critical care was time spent personally by me on the following activities: development of treatment plan with patient and/or surrogate as well as nursing, discussions with consultants, evaluation of patient's response to treatment, examination of patient, obtaining history from patient or surrogate, ordering and performing treatments and interventions, ordering and review of laboratory studies, ordering and review of radiographic studies, pulse oximetry and re-evaluation of patient's condition.    Fredia Sorrow, MD 03/05/2020 [phone]

## 2020-02-21 NOTE — H&P (Signed)
History and Physical        Hospital Admission Note Date: 02/26/2020  Patient name: [name] Medical record number: [id number] Date of birth: 07/09/1940 Age: 80 y.o. Gender: male  PCP: Burnard Bunting, MD  Patient coming from: home Lives with: wife HPOA: Son   Chief Complaint    Chief Complaint  Patient presents with  . 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 with complaints of left groin pain.  Patient was initially found to have a direct left inguinal hernia.  Dr. Tyrone Nine, ED MD, was able to reduce the hernia at the bedside.  Plan was initially to discharge home however while awaiting discharge the patient had an episode where he choked on some water, vomited and choked on his vomiting with subsequent dyspnea.  Since that time patient continued to have shortness of breath and was found to have SPO2 81% on room air and was placed on 6 L/min with sats improving to 92%.  Soon after his O2 sat was down into the 70s on 2 L and was placed on 100% NRB with some improvement to the 90s.  Continue to have increased work of breathing and was started on BiPAP.  He was also found to have atrial fibrillation with RVR and started on diltiazem bolus and drip in the ED.  Of note, during his recent hospitalization from 1/1-1/8 the patient was hospitalized for a large right pleural effusion concerning for hepatic hydrothorax.  He underwent thoracentesis on 1/3 but had  quick reaccumulation of the fluid on CXR following day.  GI was consulted and he was started on diuretics and he was worked up for possible TIPS procedure.  He was also started on midodrine due to hypotension.  He eventually underwent a second thoracentesis without reaccumulation of fluid and plan was to follow-up in the GI office and further outpatient follow-up to further evaluate candidacy for TIPS procedure.  ED Course: Initially afebrile and hemodynamically stable on room air however patient eventually became hypoxic requiring initially 6 L/min then NRB and BiPAP.  Also found to have A. fib with RVR with stable blood pressure.. Notable Labs: VBG- pH 7.2, PCO2 57, PO2 32, bicarb 22.  Sodium 131, K5.5, chloride 96, glucose 147, BUN 32, creatinine 1.49, lipase 130, AST 26, ALT 22, WBC 9.2, Hb 15.8, platelets 177, COVID-19 pending. Notable Imaging: Initial CXR-large right pleural effusion not significantly changed from CTA on 02/09/2020 with no new cardiopulmonary abnormality.  Second CXR- ill-defined airspace opacity of the left mid and lower lung regions slightly more pronounced than prior study as well as large right pleural effusion with suspected atelectasis and/or consolidation on the right. Patient received Ativan 1 mg, fentanyl, Zofran, Unasyn, diltiazem 10 mg bolus with drip.    Vitals:   03/09/2020 1530 02/20/2020 1545  BP: 96/61  98/64  Pulse: 89 86  Resp: 20 19  Temp:    SpO2: 95% 96%     Review of Systems:  Review of Systems  Unable to perform ROS: Mental status change    Medical/Social/Family History   Past Medical History: Past Medical History:  Diagnosis Date  . Anemia   . Aortic valve disorder 07/17/2018  . Arthritis    "thumbs" (11/29/2013)  . Atrial fibrillation [facility]) 2006   Dr Tonny Bollman af-no meds  . Bleeding esophageal varices ([place])   . BPH (benign prostatic hypertrophy)   . CAD (coronary artery disease)    Mild by Cath 2004. Calcification with mild irregularity.  30%  diagonal.  Cardiolite 8/13 normal EF and negative for ischemia (chronically positive ETT)   . CAP (community acquired pneumonia) 02/12/2013  . Cerebellar ataxia in diseases classified elsewhere ([place]) 10/24/2017  . Disorder of bilirubin excretion   . Diverticulosis    Sigmoid colon  . 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 Esophageal stricture   . Esophageal varices determined by endoscopy ([place]) sept 2016  . Fibrosis of liver   . Fracture of distal femur ([place]) 11/29/2013  . GERD (gastroesophageal reflux disease) 03-31-11   tx. Omeprazole  . 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 Hernia, abdominal    at the umbilicus  . History of colon cancer 02/25/2016  . History of esophageal varices   . History of hemorrhoids   . History of irregular heartbeat   . Hypercholesterolemia   . HYPERGLYCEMIA, FASTING 12/13/2007  . Hyperlipidemia   . Malignant neoplasm of ascending colon ([place]) 03/03/2011   s/p right colectomy 04/08/11 Dr. Barkley Bruns Genetic counseling recommendations:  SURVEILLANCE:  Because of the increased risk for cancer in you and your family which we cannot yet define through genetic testing, we urge you and family members to pursue screenings that are recommended for individuals at increased risk of Lynch syndrome (HNPCC)-associated cancers. The following measures are recommend  . Maxillary sinusitis, acute 05/06/2015  . 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 Nephrolithiasis   . Neuropathy 2013   as a result of chemo  . Obstructive sleep apnea 12/14/2007  . OSA on CPAP dx'd 2010   uses cap nightly  . Oxygen deficiency   . Parkinsonism ([place]) 10/24/2017  . Personal history of colonic polyps 09/17/2009   Tubular adenoma  . Pneumonia 01/2013  . 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 Portal hypertension with esophageal varices ([place]) 10/24/2017  . Resting  tremor 10/24/2017  . Shortness of breath at rest   . Sleep apnea    uses c pap at home  . TBI (traumatic brain injury) ([place]) 1986   S/P fall; "slow to get going q morning" (10//22/2015)  . Thrombocytopenia ([place]) 02/25/2016  . Titubation 10/24/2017  . Upper GI bleed 02/25/2016  . Viral URI with cough 01/24/2013    Past Surgical History:  Procedure Laterality Date  . APPENDECTOMY  03/2011  . CARDIAC CATHETERIZATION  07/2002   Dr Wynonia Lawman  . COLON SURGERY  2013  . COLONOSCOPY  sept 2016   upper endo and colonoscopy  . ESOPHAGEAL DILATION  2006  . ESOPHAGOGASTRODUODENOSCOPY (EGD) WITH PROPOFOL N/A 02/25/2016   Procedure: ESOPHAGOGASTRODUODENOSCOPY (EGD) WITH PROPOFOL;  Surgeon: Manus Gunning, MD;  Location: [facility];  Service: Gastroenterology;  Laterality: N/A;  . ESOPHAGOGASTRODUODENOSCOPY (EGD) WITH PROPOFOL N/A 03/10/2016   Procedure:  ESOPHAGOGASTRODUODENOSCOPY (EGD) WITH PROPOFOL;  Surgeon: Manus Gunning, MD;  Location: [facility];  Service: Gastroenterology;  Laterality: N/A;  . GASTRIC VARICES BANDING N/A 03/10/2016   Procedure: GASTRIC VARICES BANDING;  Surgeon: Manus Gunning, MD;  Location: [facility];  Service: Gastroenterology;  Laterality: N/A;  . INGUINAL HERNIA REPAIR Left ~ 1991  . LAPAROSCOPIC PARTIAL COLECTOMY  04/08/2011  . ORIF DISTAL FEMUR FRACTURE Right 11/29/2013   "put plate in"  . ORIF FEMUR FRACTURE Right 11/29/2013   Procedure: OPEN REDUCTION INTERNAL FIXATION (ORIF) RIGHT  DISTAL FEMUR FRACTURE;  Surgeon: Rozanna Box, MD;  Location: [place];  Service: Orthopedics;  Laterality: Right;  . PORT-A-CATH REMOVAL  11/09/2011   Procedure: REMOVAL PORT-A-CATH;  Surgeon: Odis Hollingshead, MD;  Location: [place];  Service: General;  Laterality: N/A;  . PORTACATH PLACEMENT  04/30/2011   Procedure: INSERTION PORT-A-CATH;  Surgeon: Odis Hollingshead, MD;  Location: [place];  Service: General;  Laterality: Right;  . THORACENTESIS  Right 02/11/2020   Procedure: THORACENTESIS;  Surgeon: Margaretha Seeds, MD;  Location: [place];  Service: Pulmonary;  Laterality: Right;    Medications: Prior to Admission medications   Medication Sig Start Date End Date Taking? Authorizing Provider  acetaminophen (TYLENOL) 500 MG tablet Take 1,000 mg by mouth every 6 (six) hours as needed for mild pain or headache.    [provider]  amphetamine-dextroamphetamine (ADDERALL) 30 MG tablet Take 30 mg by mouth daily as needed (for awake in the morning). 12/30/10   [provider]  Artificial Tear Ointment (DRY EYES OP) Apply 1 drop to eye daily.    [provider]  carbidopa-levodopa (SINEMET CR) 50-200 MG tablet TAKE 1 TABLET BY MOUTH TWICE DAILY 1 HOUR BEFORE BREAKFAST AND BEFORE DINNER Patient taking differently: Take 1 tablet by mouth 2 (two) times daily. 01/01/20   Dohmeier, Asencion Partridge, MD  ferrous sulfate 325 (65 FE) MG tablet Take 325 mg by mouth 2 (two) times daily.    [provider]  fluticasone (FLONASE) 50 MCG/ACT nasal spray Place 2 sprays into both nostrils at bedtime as needed for allergies or rhinitis.    [provider]  furosemide (LASIX) 40 MG tablet Take 40 mg by mouth daily.     [provider]  ipratropium (ATROVENT) 0.06 % nasal spray Place into both nostrils. 02/19/20   [provider]  midodrine (PROAMATINE) 5 MG tablet Take 1 tablet (5 mg total) by mouth 3 (three) times daily with meals. 02/16/20 03/17/20  Darliss Cheney, MD  pantoprazole (PROTONIX) 40 MG tablet Take 1 tablet (40 mg total) by mouth 2 (two) times daily. 02/29/16   Donne Hazel, MD  propranolol (INDERAL) 20 MG tablet Take 1 tablet (20 mg total) by mouth 2 (two) times daily. 02/29/16   Donne Hazel, MD  spironolactone (ALDACTONE) 100 MG tablet Take 100 mg by mouth daily.  07/18/18   [provider]    Allergies:  No Known Allergies  Social History:  reports that he quit smoking about  58 years ago. His smoking use included cigarettes. He has a 3.50 pack-year smoking history. He has never used smokeless tobacco. He reports that he does not drink alcohol and does not use drugs.  Family History: Family History  Problem Relation Age of Onset  . Diabetes Father   . Prostate cancer Father   . Heart attack Father 19  . Heart failure Mother        Congestive  heart failure  . Cancer Brother        colon  . Colon cancer Brother 18  . Stomach cancer Sister   . Colon cancer Other   . Colon cancer Sister   . Coronary artery disease Brother        CABG  . Lung cancer Brother   . Anesthesia problems Neg Hx   . Esophageal cancer Neg Hx      Objective   Physical Exam: Blood pressure 98/64, pulse 86, temperature 97.7 F (36.5 C), temperature source Oral, resp. rate 19, height 6\' 2"  (1.88 m), weight 57.6 kg, SpO2 96 %.  Physical Exam Vitals and nursing note reviewed.  Constitutional:      General: He is not in acute distress.    Comments: Arouses to verbal stimuli and falls asleep Frail elderly male  HENT:     Mouth/Throat:     Comments: BiPAP in place Eyes:     Conjunctiva/sclera: Conjunctivae normal.  Cardiovascular:     Rate and Rhythm: Tachycardia present. Rhythm irregular.  Pulmonary:     Effort: No respiratory distress.     Breath sounds: No wheezing.  Abdominal:     General: Abdomen is flat.     Tenderness: There is no abdominal tenderness.  Musculoskeletal:        General: No swelling or tenderness.  Skin:    Coloration: Skin is not jaundiced or pale.  Neurological:     Mental Status: He is disoriented.     Comments: Does not respond to questioning     LABS on Admission: I have personally reviewed all the labs and imaging below    Basic Metabolic Panel: Recent Labs  Lab 02/15/20 0911 02/26/2020 0420  NA 129* 131*  K 4.5 5.5*  CL 94* 96*  CO2 23 24  GLUCOSE 168* 147*  BUN 33* 32*  CREATININE 1.46* 1.49*  CALCIUM 9.0 9.8   Liver Function  Tests: Recent Labs  Lab 02/22/2020 0420  AST 26  ALT 22  ALKPHOS 123  BILITOT 2.4*  PROT 8.0  ALBUMIN 3.6   Recent Labs  Lab 02/29/2020 0420  LIPASE 130*   Recent Labs  Lab 02/20/2020 1042  AMMONIA 26   CBC: Recent Labs  Lab 02/15/20 0911 02/28/2020 0420  WBC 7.6 9.2  NEUTROABS 6.1  --   HGB 13.8 15.8  HCT 38.6* 46.1  MCV 87.3 90.4  PLT 172 177   Cardiac Enzymes: No results for input(s): CKTOTAL, CKMB, CKMBINDEX, TROPONINI in the last 168 hours. BNP: Invalid input(s): POCBNP CBG: No results for input(s): GLUCAP in the last 168 hours.  Radiological Exams on Admission:  DG Chest Port 1 View  Result Date: 03/04/2020 CLINICAL DATA:  80 year old male status post right-sided thoracentesis. EXAM: PORTABLE CHEST 1 VIEW COMPARISON:  Earlier radiograph dated 03/01/2020. FINDINGS: Interval decrease in the size of the right pleural effusion and improved in aeration of the right lung. No pneumothorax. Stable cardiac silhouette. No acute osseous pathology. IMPRESSION: Interval decrease in the size of the right pleural effusion and improved aeration of the right lung. No pneumothorax. Electronically Signed   By: Anner Crete M.D.   On: 02/09/2020 15:42   DG Chest Port 1 View  Result Date: 03/10/2020 CLINICAL DATA:  Respiratory distress EXAM: PORTABLE CHEST 1 VIEW COMPARISON:  February 21, 2020 study obtained earlier in the day FINDINGS: There is persistent large right pleural effusion with areas of suspected atelectasis and potential superimposed consolidation on the right. There is  ill-defined airspace opacity throughout the left mid and lower lung regions. The heart is upper normal in size with pulmonary vascularity grossly normal. No adenopathy. There is aortic atherosclerosis. No bone lesions. No pneumothorax. IMPRESSION: Ill-defined airspace opacity left mid and lower lung regions appears slightly more pronounced than on study obtained earlier in the day. Large right pleural effusion  again noted with suspected atelectasis and/or consolidation on the right. Stable cardiac silhouette. Aortic Atherosclerosis (ICD10-I70.0). Electronically Signed   By: Lowella Grip III M.D.   On: 03/09/2020 08:06   DG Chest Port 1 View  Result Date: 03/05/2020 CLINICAL DATA:  80 year old male with shortness of breath. Large right pleural effusion. Cirrhosis. EXAM: PORTABLE CHEST 1 VIEW COMPARISON:  Portable chest 02/15/2020, chest CTA 02/09/2020 and earlier. FINDINGS: Portable AP semi upright view at 0709 hours. The patient remains mildly rotated to the right with mildly increased kyphotic positioning. Subtotal opacification of the right hemithorax, sparing the right lung apex, not significantly changed when allowing for differences in patient position from the CTA on January 1st. Visible mediastinal contours and left lung ventilation appears stable. No pneumothorax. IMPRESSION: 1. Large right pleural effusion not significantly changed from the CTA on 02/09/2020. 2. No new cardiopulmonary abnormality. Electronically Signed   By: Genevie Ann M.D.   On: 02/11/2020 07:21      EKG: atrial fibrillation, rate 151   A & P   Principal Problem:   Acute hypoxemic respiratory failure (HCC) Active Problems:   Hyperlipidemia   Atrial fibrillation with RVR (HCC)   Esophageal varices determined by endoscopy ([place])   Pleural effusion on right   Respiratory distress   Inguinal hernia   1. Acute hypoxemic respiratory failure with respiratory distress, likely multifactorial: Aspiration event and recurrent large right hepatic hydrothorax a. ABG: pH 7.4, PCO2 31, PO2 60, bicarb 20 while on BiPAP b. PCCM consulted, recommended IR consult for US thoracentesis as a temporizing measure and recommended GI consult to arrange for TIPS c. Will start ceftriaxone and doxycycline to cover for aspiration pneumonia  2. Acute encephalopathy, likely multifactorial: Hypoxia and medication induced a. Patient received  Ativan for agitation in the ED and soon after became encephalopathic b. Ammonia level unremarkable c. Hold further benzos and continue to monitor d. Plan as above  3. 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 GI consulted, appreciate recommendations  4. Hypotension, concern for developing shock a. BP is slowly down trended and was 77/49 at bedside, currently 98/64 b. Titrating down Cardizem c. Family okay with pressors if needed d. Notified Dr. [name], PCCM  5. A. fib with RVR a. Cardiology recommended against anticoagulation in 2020 likely since he has a history of bleeding esophageal varices b. Currently on Cardizem drip, titrating down given his hypotension c. Telemetry  6. Left inguinal hernia a. Reduced in the ED  7. Hyperkalemia a. K5.5, continue to monitor  8. Hyperlipidemia a. Holding home meds     DVT prophylaxis: Heparin   Code Status: Partial Code  Diet: N.p.o. Family Communication: Admission, patients condition and plan of care including tests being ordered have been discussed with the patient who indicates understanding and agrees with the plan and Code Status. Patient's son and wife was updated  Disposition Plan: The appropriate patient status for this patient is INPATIENT. Inpatient status is judged to be reasonable and necessary in order to provide the required intensity of service to ensure the patient's safety.  The patient's presenting symptoms, physical exam findings, and initial radiographic and laboratory data in the context of their chronic comorbidities is felt to place them at high risk for further clinical deterioration. Furthermore, it is not anticipated that the patient will be medically stable for discharge from the hospital within 2 midnights of admission. The following factors support the patient status of inpatient.   " The  patient's presenting symptoms include inguinal hernia " The worrisome physical exam findings include altered mental status, respiratory failure. " The initial radiographic and laboratory data are worrisome because of hydrothorax. " The chronic co-morbidities include cirrhosis and recurrent hydrothorax.   * I certify that at the point of admission it is my clinical judgment that the patient will require inpatient hospital care spanning beyond 2 midnights from the point of admission due to high intensity of service, high risk for further deterioration and high frequency of surveillance required.*   Status is: Inpatient  Remains inpatient appropriate because:Hemodynamically unstable, Altered mental status, IV treatments appropriate due to intensity of illness or inability to take PO and Inpatient level of care appropriate due to severity of illness   Dispo: The patient is from: Home              Anticipated d/c is to: TBD              Anticipated d/c date is: > 3 days              Patient currently is not medically stable to d/c.         The medical decision making on this patient was of high complexity and the patient is at high risk for clinical deterioration, therefore this is a level 3 admission.  Consultants  . PCCM . GI . IR  Procedures  . BiPAP . Thoracentesis  Time Spent on Admission: 90 minutes    Savir Hedge, DO [facility]  02/27/20, 4:10 PM

## 2020-02-21 NOTE — ED Triage Notes (Signed)
Patient arrived stating he has a hernia in his groin and since noon today felt like he had increased pain and feels like he is unable to pass any gas.

## 2020-02-21 NOTE — ED Notes (Signed)
Call son first Artemis Loyal [phone]. The patients wife has hx of stroke and some cognitive deficients.

## 2020-02-21 NOTE — ED Notes (Signed)
Pt tachycardic, and tachpenic, states he is feeling very anxious after vomiting and believes that is why he cannot slow his breathing. MD made aware.

## 2020-02-21 NOTE — ED Notes (Signed)
Pt son, Ronalee Belts updated on pt status. No further questions at this time.

## 2020-02-21 NOTE — ED Notes (Signed)
MD attempted hernia reduction at bedside, pt vomited during procedure

## 2020-02-21 NOTE — Discharge Instructions (Signed)
Return if it gets stuck out and wont go back in after you lie back flat and apply gentle pressure to the area.  Return for worsening pain or vomiting.

## 2020-02-21 NOTE — Telephone Encounter (Signed)
Pt is currently admitted

## 2020-02-21 NOTE — Consult Note (Signed)
NAME:  [name], MRN:  [id number], DOB:  1940-03-04, LOS: 0 ADMISSION DATE:  02/11/2020, CONSULTATION DATE:  02/22/2020  REFERRING MD:  Dr Parke Simmers, CHIEF COMPLAINT:  Resp disterss, recurrence R hydrothorax   Brief History:  See below  History of Present Illness:   80 year old male with a history of cachexia with a BMI of 18.3, muscle spasms on Flexeril, Parkinson's disease on Sinemet, acid reflux and a history of esophageal stricture, cirrhosis presumably due to cancer chemotherapy of the liver with portal hypertension and esophageal varices [child B score as of CT scan calculation 03/06/2020], history of thrombocytopenia, sleep apnea on CPAP, 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 He was originally admitted for a week between February 09, 2020 to February 16, 2020 with concern for aspiration but evaluation showed large to moderate size right pleural effusion results of which were transudate consistent with hepatic hydrothorax swallow eval's only showed mild aspiration risk. He was discharged on sodium restriction with consideration of repeat thoracentesis and potential need for TIPS with Dr. Patsy Baltimore hematologist and IR specialist at [facility] in [place] in the future at this discharge he was also started on midodrine due to soft blood pressures. His diuresis with Lasix and Aldactone was stopped because of hyponatremia and subsequently restarted   He represented on February 21, 2020 with inguinal hernia that was reduced and according to the referring hospitalist he was being readied for discharge at which time he might of choked on food and aspirated and developed respiratory distress. He was started on BiPAP and also given lorazepam after which she became somewhat somnolent. Chest x-ray showed recurrence of the large right pleural effusion. Pulmonary  medicine critical care medicine has not been consulted. Patient is DNR according to the hospitalist   CHILD SCORE - 9 points, class B (assumes current  Mild to moder enceph and pleural effusion as ascited)  MELD SCORE - 22 ponts (20% , 3 month mortality)   Past Medical History:     has a past medical history of Anemia, Aortic valve disorder (07/17/2018), Arthritis, Atrial fibrillation ([place]) (2006), Bleeding esophageal varices ([place]), BPH (benign prostatic hypertrophy), CAD (coronary artery disease), CAP (community acquired pneumonia) (02/12/2013), Cerebellar ataxia in diseases classified elsewhere ([place]) (10/24/2017), Disorder of bilirubin excretion, Diverticulosis, ERECTILE DYSFUNCTION, 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), Esophageal stricture, Esophageal varices determined by endoscopy ([place]) (sept 2016), Fibrosis of liver, Fracture of distal femur ([place]) (11/29/2013), GERD (gastroesophageal reflux disease) (03-31-11), 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), Hernia, abdominal, History of colon cancer (02/25/2016), History of esophageal varices, History of hemorrhoids, History of irregular heartbeat, Hypercholesterolemia, HYPERGLYCEMIA, FASTING (12/13/2007), Hyperlipidemia, Malignant neoplasm of ascending colon ([place]) (03/03/2011), Maxillary sinusitis, acute (05/06/2015), 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   reports that he quit smoking about 58 years ago. His smoking use  included cigarettes. He has a 3.50 pack-year smoking history. He has never used smokeless tobacco.  Past Surgical History:  Procedure Laterality Date  . APPENDECTOMY  03/2011  . CARDIAC CATHETERIZATION  07/2002   Dr Wynonia Lawman  .  COLON SURGERY  2013  . COLONOSCOPY  sept 2016   upper endo and colonoscopy  . ESOPHAGEAL DILATION  2006  . ESOPHAGOGASTRODUODENOSCOPY (EGD) WITH PROPOFOL N/A 02/25/2016   Procedure: ESOPHAGOGASTRODUODENOSCOPY (EGD) WITH PROPOFOL;  Surgeon: Manus Gunning, MD;  Location: [facility];  Service: Gastroenterology;  Laterality: N/A;  . ESOPHAGOGASTRODUODENOSCOPY (EGD) WITH PROPOFOL N/A 03/10/2016   Procedure: ESOPHAGOGASTRODUODENOSCOPY (EGD) WITH PROPOFOL;  Surgeon: Manus Gunning, MD;  Location: [facility];  Service: Gastroenterology;  Laterality: N/A;  . GASTRIC VARICES BANDING N/A 03/10/2016   Procedure: GASTRIC VARICES BANDING;  Surgeon: Manus Gunning, MD;  Location: [facility];  Service: Gastroenterology;  Laterality: N/A;  . INGUINAL HERNIA REPAIR Left ~ 1991  . LAPAROSCOPIC PARTIAL COLECTOMY  04/08/2011  . ORIF DISTAL FEMUR FRACTURE Right 11/29/2013   "put plate in"  . ORIF FEMUR FRACTURE Right 11/29/2013   Procedure: OPEN REDUCTION INTERNAL FIXATION (ORIF) RIGHT  DISTAL FEMUR FRACTURE;  Surgeon: Rozanna Box, MD;  Location: [facility];  Service: Orthopedics;  Laterality: Right;  . PORT-A-CATH REMOVAL  11/09/2011   Procedure: REMOVAL PORT-A-CATH;  Surgeon: Odis Hollingshead, MD;  Location: [facility];  Service: General;  Laterality: N/A;  . PORTACATH PLACEMENT  04/30/2011   Procedure: INSERTION PORT-A-CATH;  Surgeon: Odis Hollingshead, MD;  Location: [facility];  Service: General;  Laterality: Right;  . THORACENTESIS Right 02/11/2020   Procedure: THORACENTESIS;  Surgeon: Margaretha Seeds, MD;  Location: [facility];  Service: Pulmonary;  Laterality: Right;    No Known Allergies  Immunization History  Administered Date(s)  Administered  . Hepatitis B, ped/adol 12/06/2014, 01/09/2015, 06/10/2015  . Influenza Split 01/19/2011, 11/08/2017  . Influenza Whole 11/15/2007, 12/30/2009  . Influenza, High Dose Seasonal PF 12/09/2014  . Influenza,inj,Quad PF,6+ Mos 11/21/2012, 11/30/2013  . Influenza,inj,quad, With Preservative 10/11/2016  . Influenza-Unspecified 11/02/2016  . Pneumococcal Conjugate-13 02/12/2014  . Pneumococcal Polysaccharide-23 05/24/2008  . Td 02/08/2002  . Tdap 05/28/2013  . Zoster 01/19/2011    Family History  Problem Relation Age of Onset  . Diabetes Father   . Prostate cancer Father   . Heart attack Father 23  . Heart failure Mother        Congestive heart failure  . Cancer Brother        colon  . Colon cancer Brother 21  . Stomach cancer Sister   . Colon cancer Other   . Colon cancer Sister   . Coronary artery disease Brother        CABG  . Lung cancer Brother   . Anesthesia problems Neg Hx   . Esophageal cancer Neg Hx      Current Facility-Administered Medications:  .  cefTRIAXone (ROCEPHIN) 1 g in sodium chloride 0.9 % 100 mL IVPB, 1 g, Intravenous, Q24H, Anais Hedge, MD, Last Rate: 200 mL/hr at 03/01/2020 1115, 1 g at 02/20/2020 1115 .  diltiazem (CARDIZEM) 125 mg in dextrose 5% 125 mL (1 mg/mL) infusion, 5-15 mg/hr, Intravenous, Continuous, Rakeen Hedge, MD, Last Rate: 12.5 mL/hr at 02/20/2020 1041, 12.5 mg/hr at 03/10/2020 1041 .  doxycycline (VIBRAMYCIN) 100 mg in sodium chloride 0.9 % 250 mL IVPB, 100 mg, Intravenous, Q12H, Marva Panda E, MD .  heparin injection 5,000 Units, 5,000 Units, Subcutaneous, Q8H, Neysa Bonito, Jared E, MD .  ondansetron (ZOFRAN) tablet 4 mg, 4 mg, Oral, Q6H PRN **OR** ondansetron (ZOFRAN) injection 4 mg, 4 mg, Intravenous, Q6H PRN, Marva Panda E, MD .  sodium chloride flush (NS) 0.9 % injection  3 mL, 3 mL, Intravenous, Q12H, Jaben Hedge, MD, 3 mL at 02/13/2020 1056  Current Outpatient Medications:  .  acetaminophen (TYLENOL) 500 MG tablet, Take 1,000  mg by mouth every 6 (six) hours as needed for mild pain or headache., Disp: , Rfl:  .  amphetamine-dextroamphetamine (ADDERALL) 30 MG tablet, Take 30 mg by mouth daily as needed (for awake in the morning)., Disp: , Rfl:  .  Artificial Tear Ointment (DRY EYES OP), Apply 1 drop to eye daily., Disp: , Rfl:  .  carbidopa-levodopa (SINEMET CR) 50-200 MG tablet, TAKE 1 TABLET BY MOUTH TWICE DAILY 1 HOUR BEFORE BREAKFAST AND BEFORE DINNER (Patient taking differently: Take 1 tablet by mouth 2 (two) times daily.), Disp: 180 tablet, Rfl: 1 .  ferrous sulfate 325 (65 FE) MG tablet, Take 325 mg by mouth 2 (two) times daily., Disp: , Rfl:  .  fluticasone (FLONASE) 50 MCG/ACT nasal spray, Place 2 sprays into both nostrils at bedtime as needed for allergies or rhinitis., Disp: , Rfl:  .  furosemide (LASIX) 40 MG tablet, Take 40 mg by mouth daily. , Disp: , Rfl:  .  ipratropium (ATROVENT) 0.06 % nasal spray, Place into both nostrils., Disp: , Rfl:  .  midodrine (PROAMATINE) 5 MG tablet, Take 1 tablet (5 mg total) by mouth 3 (three) times daily with meals., Disp: 90 tablet, Rfl: 0 .  pantoprazole (PROTONIX) 40 MG tablet, Take 1 tablet (40 mg total) by mouth 2 (two) times daily., Disp: 60 tablet, Rfl: 0 .  propranolol (INDERAL) 20 MG tablet, Take 1 tablet (20 mg total) by mouth 2 (two) times daily., Disp: 60 tablet, Rfl: 0 .  spironolactone (ALDACTONE) 100 MG tablet, Take 100 mg by mouth daily. , Disp: , Rfl:    Significant Hospital Events:  02/26/2020 - admot 1/13 -ccm consult  Consults:  03/07/2020 - ccm  Procedures:  02/09/2020 - rt thora planned  Significant Diagnostic Tests:  02/14/2020 - thora right  Micro Data:  1/13 - pleura  Antimicrobials:   Ceftriaxone 1/13 Doxy 1/13 Unasyn 1/13 - 1/13     Interim History / Subjective:  02/24/2020 - seen in [facility] ER resus A  Objective   Blood pressure 96/65, pulse (!) 123, temperature 97.7 F (36.5 C), temperature source Oral, resp. rate 20, height 6'  2" (1.88 m), weight 57.6 kg, SpO2 98 %.    FiO2 (%):  [50 %-100 %] 50 %   Intake/Output Summary (Last 24 hours) at 02/15/2020 1139 Last data filed at 03/07/2020 0804 Gross per 24 hour  Intake 100 ml  Output --  Net 100 ml   Filed Weights   02/24/2020 0134  Weight: 57.6 kg    Examination: General: frail, cachectic lyiing in ER bed HENT: BiPAP on Lungs: Cleart, Mildly tachpneic but not paradoxical Cardiovascular: Maintains BP/HR, A Fib Abdomen: soft, ASCITEs+ Extremities: cachectic without edema Neuro: RASS -2 equivalent (s.p ativan) GU: not examined  Resolved Hospital Problem list   x  Assessment & Plan:  Acute hypoxemic respiratory failure and respiratory distress -   -In the setting of significant right hepatic hydrothorax [personally visualized chest x-ray [  -Possible recurrent aspiration in the ER [similar to February 09, 2020 but speech eval in the last 1 week which only showed mild aspiration risk]  Acute encephalopathy in the setting of respiratory distress following lorazepam   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 as except meds   -Encephalopathy controlled by avoiding lorazepam and institution of lactulose +/- Xifaxan per GI   CCM will follow  Best practice (evaluated daily)  pe triad  Goals of Care:  DNR per triad but role of pressors needs clarification     ATTESTATION & SIGNATURE   The patient [name] is critically ill with multiple organ systems  failure and requires high complexity decision making for assessment and support, frequent evaluation and titration of therapies, application of advanced monitoring technologies and extensive interpretation of multiple databases.   Critical Care Time devoted to patient care services described in this note is  60  Minutes. This time reflects time of care of this signee Dr Brand Males. This critical care time does not reflect procedure time, or teaching time or supervisory time of PA/NP/Med student/Med Resident etc but could involve care discussion time     Dr. Brand Males, M.D., [facility].C.P Pulmonary and Critical Care Medicine Staff Physician [place] Pager: [phone], If no answer or between  15:00h - 7:00h: call [phone]  03/07/2020 11:39 AM     LABS    PULMONARY Recent Labs  Lab 03/08/2020 0845 03/02/2020 1008  PHART  --  7.428  PCO2ART  --  31.5*  PO2ART  --  59.9*  HCO3 22.4 20.6  O2SAT 47.1 91.2    CBC Recent Labs  Lab 02/15/20 0911 02/14/2020 0420  HGB 13.8 15.8  HCT 38.6* 46.1  WBC 7.6 9.2  PLT 172 177    COAGULATION No results for input(s): INR in the last 168 hours.  CARDIAC  No results for input(s): TROPONINI in the last 168 hours. No results for input(s): PROBNP in the last 168 hours.   CHEMISTRY Recent Labs  Lab 02/14/20 1522 02/15/20 0911 03/03/2020 0420  NA 132* 129* 131*  K 3.9 4.5 5.5*  CL 95* 94* 96*  CO2 25 23 24   GLUCOSE 145* 168* 147*  BUN 31* 33* 32*  CREATININE 1.31* 1.46* 1.49*  CALCIUM 9.0 9.0 9.8   Estimated Creatinine Clearance: 33.3 mL/min (A) (by C-G formula based on SCr of 1.49 mg/dL (H)).   LIVER Recent Labs  Lab 03/06/2020 0420  AST 26  ALT 22  ALKPHOS 123  BILITOT 2.4*  PROT 8.0  ALBUMIN 3.6     INFECTIOUS No results for input(s): LATICACIDVEN, PROCALCITON in the last 168 hours.   ENDOCRINE CBG (last 3)  No results for input(s): GLUCAP in the last  72 hours.       IMAGING x48h  - image(s) personally visualized  -   highlighted in bold DG Chest Port 1 View  Result Date: 02/24/2020 CLINICAL DATA:  Respiratory distress EXAM: PORTABLE CHEST 1 VIEW COMPARISON:  February 21, 2020 study obtained earlier in the day FINDINGS: There is persistent large right pleural effusion with areas of suspected atelectasis and potential superimposed consolidation on the right. There is ill-defined airspace opacity throughout the left mid and lower lung regions. The heart is upper normal in size  with pulmonary vascularity grossly normal. No adenopathy. There is aortic atherosclerosis. No bone lesions. No pneumothorax. IMPRESSION: Ill-defined airspace opacity left mid and lower lung regions appears slightly more pronounced than on study obtained earlier in the day. Large right pleural effusion again noted with suspected atelectasis and/or consolidation on the right. Stable cardiac silhouette. Aortic Atherosclerosis (ICD10-I70.0). Electronically Signed   By: Lowella Grip III M.D.   On: 03/21/2020 08:06   DG Chest Port 1 View  Result Date: 03-21-20 CLINICAL DATA:  80 year old male with shortness of breath. Large right pleural effusion. Cirrhosis. EXAM: PORTABLE CHEST 1 VIEW COMPARISON:  Portable chest 02/15/2020, chest CTA 02/09/2020 and earlier. FINDINGS: Portable AP semi upright view at 0709 hours. The patient remains mildly rotated to the right with mildly increased kyphotic positioning. Subtotal opacification of the right hemithorax, sparing the right lung apex, not significantly changed when allowing for differences in patient position from the CTA on January 1st. Visible mediastinal contours and left lung ventilation appears stable. No pneumothorax. IMPRESSION: 1. Large right pleural effusion not significantly changed from the CTA on 02/09/2020. 2. No new cardiopulmonary abnormality. Electronically Signed   By: Genevie Ann M.D.   On: 2020-03-21 07:21

## 2020-02-21 NOTE — Procedures (Signed)
PROCEDURE SUMMARY:  Successful image-guided right thoracentesis. Yielded 1.5 liters of hazy gold fluid. Patient tolerated procedure well. No immediate complications. EBL = 0 mL.  Specimen was sent for labs. CXR ordered.  Please see imaging section of Epic for full dictation.   Claris Pong Maciej Schweitzer PA-C 2020-03-01 2:40 PM

## 2020-02-22 ENCOUNTER — Inpatient Hospital Stay (HOSPITAL_COMMUNITY): Payer: Medicare Other

## 2020-02-22 ENCOUNTER — Inpatient Hospital Stay: Payer: Self-pay

## 2020-02-22 ENCOUNTER — Encounter (HOSPITAL_COMMUNITY): Payer: Self-pay | Admitting: Internal Medicine

## 2020-02-22 DIAGNOSIS — J90 Pleural effusion, not elsewhere classified: Secondary | ICD-10-CM | POA: Diagnosis not present

## 2020-02-22 DIAGNOSIS — K766 Portal hypertension: Secondary | ICD-10-CM | POA: Diagnosis not present

## 2020-02-22 DIAGNOSIS — N179 Acute kidney failure, unspecified: Secondary | ICD-10-CM

## 2020-02-22 DIAGNOSIS — J9601 Acute respiratory failure with hypoxia: Secondary | ICD-10-CM | POA: Diagnosis not present

## 2020-02-22 LAB — URINALYSIS, ROUTINE W REFLEX MICROSCOPIC
Bilirubin Urine: NEGATIVE
Glucose, UA: NEGATIVE mg/dL
Hgb urine dipstick: NEGATIVE
Ketones, ur: 5 mg/dL — AB
Leukocytes,Ua: NEGATIVE
Nitrite: NEGATIVE
Protein, ur: NEGATIVE mg/dL
Specific Gravity, Urine: 1.019 (ref 1.005–1.030)
pH: 5 (ref 5.0–8.0)

## 2020-02-22 LAB — CBC
HCT: 37.6 % — ABNORMAL LOW (ref 39.0–52.0)
HCT: 39.1 % (ref 39.0–52.0)
Hemoglobin: 13.2 g/dL (ref 13.0–17.0)
Hemoglobin: 13.1 g/dL (ref 13.0–17.0)
MCH: 31.3 pg (ref 26.0–34.0)
MCH: 31 pg (ref 26.0–34.0)
MCHC: 35.1 g/dL (ref 30.0–36.0)
MCHC: 33.5 g/dL (ref 30.0–36.0)
MCV: 89.1 fL (ref 80.0–100.0)
MCV: 92.7 fL (ref 80.0–100.0)
Platelets: 176 10*3/uL (ref 150–400)
Platelets: 161 10*3/uL (ref 150–400)
RBC: 4.22 MIL/uL (ref 4.22–5.81)
RBC: 4.22 MIL/uL (ref 4.22–5.81)
RDW: 14.8 % (ref 11.5–15.5)
RDW: 14.9 % (ref 11.5–15.5)
WBC: 21.2 10*3/uL — ABNORMAL HIGH (ref 4.0–10.5)
WBC: 20.6 10*3/uL — ABNORMAL HIGH (ref 4.0–10.5)
nRBC: 0 % (ref 0.0–0.2)
nRBC: 0 % (ref 0.0–0.2)

## 2020-02-22 LAB — COMPREHENSIVE METABOLIC PANEL
ALT: 25 U/L (ref 0–44)
AST: 26 U/L (ref 15–41)
Albumin: 2.8 g/dL — ABNORMAL LOW (ref 3.5–5.0)
Alkaline Phosphatase: 104 U/L (ref 38–126)
Anion gap: 13 (ref 5–15)
BUN: 49 mg/dL — ABNORMAL HIGH (ref 8–23)
CO2: 20 mmol/L — ABNORMAL LOW (ref 22–32)
Calcium: 8.6 mg/dL — ABNORMAL LOW (ref 8.9–10.3)
Chloride: 102 mmol/L (ref 98–111)
Creatinine, Ser: 1.96 mg/dL — ABNORMAL HIGH (ref 0.61–1.24)
GFR, Estimated: 34 mL/min — ABNORMAL LOW (ref >60)
Glucose, Bld: 166 mg/dL — ABNORMAL HIGH (ref 70–99)
Potassium: 5 mmol/L (ref 3.5–5.1)
Sodium: 135 mmol/L (ref 135–145)
Total Bilirubin: 1.6 mg/dL — ABNORMAL HIGH (ref 0.3–1.2)
Total Protein: 6.4 g/dL — ABNORMAL LOW (ref 6.5–8.1)

## 2020-02-22 LAB — CREATININE, URINE, RANDOM: Creatinine, Urine: 155.72 mg/dL

## 2020-02-22 LAB — MRSA PCR SCREENING: MRSA by PCR: NEGATIVE

## 2020-02-22 LAB — CBG MONITORING, ED: Glucose-Capillary: 150 mg/dL — ABNORMAL HIGH (ref 70–99)

## 2020-02-22 LAB — CYTOLOGY - NON PAP

## 2020-02-22 LAB — TRIGLYCERIDES, BODY FLUIDS: Triglycerides, Fluid: 77 mg/dL

## 2020-02-22 LAB — PH, BODY FLUID: pH, Body Fluid: 7.5

## 2020-02-22 LAB — PROTEIN, PLEURAL OR PERITONEAL FLUID: Total protein, fluid: <3 g/dL

## 2020-02-22 LAB — PROTIME-INR
INR: 1.4 — ABNORMAL HIGH (ref 0.8–1.2)
Prothrombin Time: 16.9 seconds — ABNORMAL HIGH (ref 11.4–15.2)

## 2020-02-22 LAB — ALBUMIN, PLEURAL OR PERITONEAL FLUID: Albumin, Fluid: 1.2 g/dL

## 2020-02-22 LAB — SODIUM, URINE, RANDOM: Sodium, Ur: 26 mmol/L

## 2020-02-22 MED ORDER — SODIUM CHLORIDE 0.9 % IV SOLN: INTRAVENOUS | Status: DC

## 2020-02-22 MED ORDER — PANTOPRAZOLE SODIUM 40 MG IV SOLR
40.0000 mg | Freq: Every day | INTRAVENOUS | Status: DC
  Administered 2020-02-22 – 2020-02-24 (×3): 40 mg via INTRAVENOUS
  Filled 2020-02-22 (×4): qty 40

## 2020-02-22 MED ORDER — SODIUM CHLORIDE 0.9 % IV BOLUS
500.0000 mL | Freq: Once | INTRAVENOUS | Status: AC
  Administered 2020-02-22: 500 mL via INTRAVENOUS

## 2020-02-22 MED ORDER — ALBUMIN HUMAN 25 % IV SOLN
12.5000 g | Freq: Once | INTRAVENOUS | Status: AC
  Administered 2020-02-22: 12.5 g via INTRAVENOUS
  Filled 2020-02-22: qty 50

## 2020-02-22 MED ORDER — CHLORHEXIDINE GLUCONATE CLOTH 2 % EX PADS
6.0000 | MEDICATED_PAD | Freq: Every day | CUTANEOUS | Status: DC
  Administered 2020-02-22 – 2020-02-24 (×3): 6 via TOPICAL

## 2020-02-22 MED ORDER — ALBUMIN HUMAN 25 % IV SOLN
12.5000 g | Freq: Four times a day (QID) | INTRAVENOUS | Status: AC
  Administered 2020-02-22 – 2020-02-23 (×3): 12.5 g via INTRAVENOUS
  Filled 2020-02-22 (×3): qty 50

## 2020-02-22 MED ORDER — ORAL CARE MOUTH RINSE
15.0000 mL | Freq: Two times a day (BID) | OROMUCOSAL | Status: DC
  Administered 2020-02-23 – 2020-02-24 (×4): 15 mL via OROMUCOSAL

## 2020-02-22 MED ORDER — MIDODRINE HCL 5 MG PO TABS
10.0000 mg | ORAL_TABLET | Freq: Three times a day (TID) | ORAL | Status: DC
  Filled 2020-02-22 (×2): qty 2

## 2020-02-22 NOTE — ED Notes (Signed)
Oral care provided 

## 2020-02-22 NOTE — ED Notes (Signed)
Attempted to call report. This RN to call back in 10 mins

## 2020-02-22 NOTE — ED Notes (Signed)
Patient seems more alert and talking to staff. Peri care provided and condom cath. Applied.

## 2020-02-22 NOTE — Progress Notes (Signed)
Referring Physician(s): [place] Physician: Ruthann Cancer  Patient Status:  [facility] - In-pt  Chief Complaint: Cough, dyspnea, recurrent right hepatic hydrothorax   Subjective: Patient familiar to IR service from random core liver biopsy in 2016, and right thoracentesis on March 13, 2020 yielding 1.5 L of fluid.  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  Patient reportedly had EGD in June 2021 and he is esophageal varices were "eradicated".  He has been followed by [facility].  Current labs include WBC 20.6, hemoglobin 13.1, platelets 161k, creatinine 1.96, PT 16.9, INR 1.4, sodium 135, potassium 5, total bilirubin 1.6.  COVID-19 negative.  Current MELD score around 19 .  CT abdomen pelvis performed on 1/7 revealed:  1. Cirrhosis. No suspicious liver masses. 2. 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 No acute vascular abnormality. 3. Small volume ascites. Mild anasarca. 4. Mild-to-moderate splenomegaly. Moderate lower esophageal and paraumbilical varices. 5. Large right pleural effusion. 6. Cholelithiasis.  No biliary ductal dilatation. 7. Mild sigmoid diverticulosis. 8. Chronic mild diffuse bladder wall thickening, probably due to chronic bladder outlet obstruction by the mildly enlarged prostate. 9. Aortic  Atherosclerosis   Request now received from GI service to evaluate patient for possible TIPS candidacy.   Allergies: Patient has no known allergies.  Medications: Prior to Admission medications   Medication Sig Start Date End Date Taking? Authorizing Provider  acetaminophen (TYLENOL) 500 MG tablet Take 1,000 mg by mouth every 6 (six) hours as needed for mild pain or headache.   Yes [provider]  amphetamine-dextroamphetamine (ADDERALL) 30 MG tablet Take 30 mg by mouth daily as needed (for awake in the morning). 12/30/10  Yes [provider]  Artificial Tear Ointment (DRY EYES OP) Apply 1 drop to eye daily.   Yes [provider]  carbidopa-levodopa (SINEMET CR) 50-200 MG tablet TAKE 1 TABLET BY MOUTH TWICE DAILY 1 HOUR BEFORE BREAKFAST AND BEFORE DINNER Patient taking differently: Take 1 tablet by mouth 2 (two) times daily. 01/01/20  Yes Dohmeier, Asencion Partridge, MD  carboxymethylcellulose (REFRESH PLUS) 0.5 % SOLN Place 1 drop into both eyes 3 (three) times daily as needed (dry eyes).   Yes [provider]  ferrous sulfate 325 (65 FE) MG tablet Take 325 mg by mouth 2 (two) times daily.   Yes [provider]  fluticasone (FLONASE) 50 MCG/ACT nasal spray Place 2 sprays into both nostrils at bedtime as needed for allergies or rhinitis.   Yes [provider]  furosemide (LASIX) 40 MG tablet Take 40 mg by mouth daily.    Yes [provider]  ipratropium (ATROVENT) 0.06 % nasal spray Place into both nostrils. 02/19/20  Yes [provider]  midodrine (PROAMATINE) 5 MG tablet Take 1 tablet (5 mg total) by mouth 3 (three) times daily with meals. 02/16/20 03/17/20 Yes Pahwani, Einar Grad, MD  pantoprazole (PROTONIX) 40 MG tablet Take 1 tablet (40 mg total) by mouth 2 (two) times daily. 02/29/16  Yes Donne Hazel, MD  propranolol (INDERAL) 20 MG tablet  Take 1 tablet (20 mg total) by mouth 2 (two) times daily. 02/29/16  Yes Donne Hazel, MD   spironolactone (ALDACTONE) 100 MG tablet Take 100 mg by mouth daily.  07/18/18  Yes [provider]     Vital Signs: BP 106/67   Pulse (!) 106   Temp 98.3 F (36.8 C) (Axillary)   Resp 20   Ht 6\' 2"  (1.88 m)   Wt 127 lb (57.6 kg)   SpO2 (!) 89%   BMI 16.31 kg/m   Physical Exam patient cachectic appearing, awake, alert.  Chest with diminished breath sounds right base, left clear.  Heart with irregularly irregular rhythm.  Abdomen soft, positive bowel sounds, currently nontender.  No lower extremity edema  Imaging: US RENAL  Result Date: 02/22/2020 CLINICAL DATA:  Acute kidney injury EXAM: RENAL / URINARY TRACT ULTRASOUND COMPLETE COMPARISON:  CT abdomen and pelvis 02/14/2020 FINDINGS: Right Kidney: Renal measurements: 9.9 x 4.3 x 4.3 cm = volume: 96 mL. Borderline increased cortical echogenicity. Normal cortical thickness. No mass, hydronephrosis, or shadowing calcification. Left Kidney: Renal measurements: 10.7 x 4.6 x 4.3 cm = volume: 111 mL. Minimal cortical thinning. Increased cortical echogenicity. No mass, hydronephrosis, or shadowing calcification. Bladder: Appears normal for degree of bladder distention. Other: Incidentally noted RIGHT pleural effusion. IMPRESSION: Suspected medical renal disease changes of both kidneys. No evidence of renal mass or hydronephrosis. RIGHT pleural effusion. Electronically Signed   By: Lavonia Dana M.D.   On: 02/22/2020 15:52   DG Chest Port 1 View  Result Date: 02/20/2020 CLINICAL DATA:  80 year old male status post right-sided thoracentesis. EXAM: PORTABLE CHEST 1 VIEW COMPARISON:  Earlier radiograph dated 03/07/2020. FINDINGS: Interval decrease in the size of the right pleural effusion and improved in aeration of the right lung. No pneumothorax. Stable cardiac silhouette. No acute osseous pathology. IMPRESSION: Interval decrease in the size of the right pleural effusion and improved aeration of the right lung. No pneumothorax. Electronically  Signed   By: Anner Crete M.D.   On: 03/04/2020 15:42   DG Chest Port 1 View  Result Date: 02/17/2020 CLINICAL DATA:  Respiratory distress EXAM: PORTABLE CHEST 1 VIEW COMPARISON:  February 21, 2020 study obtained earlier in the day FINDINGS: There is persistent large right pleural effusion with areas of suspected atelectasis and potential superimposed consolidation on the right. There is ill-defined airspace opacity throughout the left mid and lower lung regions. The heart is upper normal in size with pulmonary vascularity grossly normal. No adenopathy. There is aortic atherosclerosis. No bone lesions. No pneumothorax. IMPRESSION: Ill-defined airspace opacity left mid and lower lung regions appears slightly more pronounced than on study obtained earlier in the day. Large right pleural effusion again noted with suspected atelectasis and/or consolidation on the right. Stable cardiac silhouette. Aortic Atherosclerosis (ICD10-I70.0). Electronically Signed   By: Lowella Grip III M.D.   On: 02/10/2020 08:06   DG Chest Port 1 View  Result Date: 03/05/2020 CLINICAL DATA:  80 year old male with shortness of breath. Large right pleural effusion. Cirrhosis. EXAM: PORTABLE CHEST 1 VIEW COMPARISON:  Portable chest 02/15/2020, chest CTA 02/09/2020 and earlier. FINDINGS: Portable AP semi upright view at 0709 hours. The patient remains mildly rotated to the right with mildly increased kyphotic positioning. Subtotal opacification of the right hemithorax, sparing the right lung apex, not significantly changed when allowing for differences in patient position from the CTA on January 1st. Visible mediastinal contours and left lung ventilation appears stable. No pneumothorax. IMPRESSION: 1. Large right pleural effusion  not significantly changed from the CTA on 02/09/2020. 2. No new cardiopulmonary abnormality. Electronically Signed   By: Genevie Ann M.D.   On: 02/24/2020 07:21   [place] EKG SITE RITE  Result Date:  02/22/2020 If Site Rite image not attached, placement could not be confirmed due to current cardiac rhythm.  US THORACENTESIS ASP PLEURAL SPACE W/IMG GUIDE  Result Date: 02/20/2020 INDICATION: Patient with history of colon cancer, portal hypertensive gastropathy, esophageal varices, unspecified cirrhosis, and recurrent right hepatic hydrothorax. Request is made for diagnostic and therapeutic right thoracentesis up to 1.5 L. EXAM: ULTRASOUND GUIDED DIAGNOSTIC AND THERAPEUTIC RIGHT THORACENTESIS MEDICATIONS: 10 mL 1% lidocaine COMPLICATIONS: None immediate. PROCEDURE: An ultrasound guided thoracentesis was thoroughly discussed with the patient and questions answered. The benefits, risks, alternatives and complications were also discussed. The patient understands and wishes to proceed with the procedure. Written consent was obtained. Ultrasound was performed to localize and mark an adequate pocket of fluid in the right chest. The area was then prepped and draped in the normal sterile fashion. 1% Lidocaine was used for local anesthesia. Under ultrasound guidance a 6 Fr Safe-T-Centesis catheter was introduced. Thoracentesis was performed. The catheter was removed and a dressing applied. FINDINGS: A total of approximately 1.5 L of hazy gold fluid was removed. samples were sent to the laboratory as requested by the clinical team. IMPRESSION: Successful ultrasound guided right thoracentesis yielding 1.5 L of pleural fluid. Read by: Earley Abide, PA-C Electronically Signed   By: Miachel Roux M.D.   On: 02/17/2020 15:50    Labs:  CBC: Recent Labs    02/15/20 0911 03/05/2020 0420 02/22/20 0456 02/22/20 0600  WBC 7.6 9.2 21.2* 20.6*  HGB 13.8 15.8 13.2 13.1  HCT 38.6* 46.1 37.6* 39.1  PLT 172 177 176 161    COAGS: Recent Labs    02/09/20 2010 02/13/20 1511 02/22/20 0600  INR 1.4* 1.3* 1.4*    BMP: Recent Labs    02/14/20 1522 02/15/20 0911 02/14/2020 0420 02/22/20 0600  NA 132* 129* 131* 135   K 3.9 4.5 5.5* 5.0  CL 95* 94* 96* 102  CO2 25 23 24  20*  GLUCOSE 145* 168* 147* 166*  BUN 31* 33* 32* 49*  CALCIUM 9.0 9.0 9.8 8.6*  CREATININE 1.31* 1.46* 1.49* 1.96*  GFRNONAA 56* 49* 48* 34*    LIVER FUNCTION TESTS: Recent Labs    02/13/20 0059 02/14/20 0056 02/20/2020 0420 02/22/20 0600  BILITOT 1.6* 1.1 2.4* 1.6*  AST 22 16 26 26   ALT 5 7 22 25   ALKPHOS 95 102 123 104  PROT 7.4 6.2* 8.0 6.4*  ALBUMIN 2.9* 2.5* 3.6 2.8*    Assessment and Plan: Patient familiar to IR service from random core liver biopsy in 2016, and right thoracentesis on 02/24/2020 yielding 1.5 L of fluid.  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  Patient reportedly had EGD in June 2021 and he is esophageal varices were "eradicated".  He has been followed by [facility].  Current labs include WBC 20.6, hemoglobin 13.1, platelets 161k, creatinine 1.96, PT 16.9, INR 1.4, sodium 135, potassium 5, total bilirubin 1.6.  COVID-19 negative.  Current MELD score around 19 .  CT abdomen pelvis performed on 1/7 revealed:  1. Cirrhosis. No suspicious liver masses. 2. 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 No acute vascular abnormality. 3. Small volume ascites. Mild anasarca. 4. Mild-to-moderate splenomegaly. Moderate lower esophageal and paraumbilical varices. 5. Large right pleural effusion. 6. Cholelithiasis.  No biliary ductal dilatation. 7. Mild sigmoid diverticulosis. 8. Chronic  mild diffuse bladder wall thickening, probably due to chronic bladder outlet obstruction by the mildly enlarged prostate. 9. Aortic Atherosclerosis   Request now received from GI service to evaluate patient for possible TIPS candidacy.  Patient currently in ICU for acute on chronic kidney disease, hypotension on neodrip and Rocephin for possible aspiration pneumonia.  Latest imaging studies, labs and clinical history reviewed extensively by Dr. Serafina Royals.  Details/risks of procedure, including but not limited to, internal bleeding, infection, encephalopathy, inability to place shunt, anesthesia related complications and death discussed with both patient and son.  They both appear interested in proceeding with TIPS placement and plan to discuss matter further.  If done the procedure would be performed at [facility] with anesthesia assistance.  There is currently no emergent need for TIPS.  Most likely scenario would be to have patient scheduled for TIPS with Dr. Serafina Royals when available in approximately 2-1/2 weeks at [facility].  He could intermittently have thoracentesis performed in the interim for symptomatic right pleural effusion.  In the meantime try to improve on kidney function and stabilize BP prior to TIPS.    Electronically Signed: D. Rowe Robert, PA-C 02/22/2020, 3:58 PM   I spent a total of 40 minutes at the the patient's bedside AND on the patient's hospital floor or unit, greater than 50% of which was counseling/coordinating care for possible transjugular intrahepatic portosystemic shunt placement    Patient ID: [name], male   DOB: 10-Oct-1940, 80 y.o.   MRN: [id number]

## 2020-02-22 NOTE — Progress Notes (Signed)
[facility] Progress Note  CC: Recurrent hepatic hydrothorax   Subjective: He is more awake today and conversant. He stated vomiting yesterday shortly after the ED physician reduced his hernia. He developed excessive coughing and SOB. Currently, he denies feeling SOB. He is off Bipap. No CP. No N/V. No abdominal pain. He was able to tell me he had an EGD 07/2019 and his EV were "eradicated". He stated he was advised by his [facility] to repeat an EGD in 1 year. He stated since he was discharged from the hospital a few weeks ago, he didn't have any significant difficulty with swallowing as long as he chewed his food thoroughly. He is passing normal yellow brown formed stools daily at home, no BM x 2 days. No rectal bleeding or black stools. No family at the bed side. His RN reports his BP remains soft, SBP 90's on Neo gtt. His is a DNI, no CPR.   Objective:   Right Thoracentesis 02/20/2020: 1.5L hazy gold pleural fluid removed. Cytology pending. Pleural T. Protein level < 3.0. Pleural albumin level 1.2. SPAG > 1.1 which is consistent with hepatic hydrothorax. Cell count with diff not done.  Chest Xray post thoracentesis 03/01/2020: Interval decrease in the size of the right pleural effusion and improved aeration of the right lung. No pneumothorax  Right Thoracentesis 02/12/2020: Fluid studies confirm transudate of fluid.  Total nucleated cells 816, 30% neutrophils. PMN 244.8. Total Protein < 3.0. LDH 82 (3-23).  EGD 07/2019:  Unable to retrieve report from [facility].  Pt reported varices were banded.  No path from that date.  Dr Blenda Mounts planned repeat E  EGD 11/2018: Only able to find pathology, not operative report.  A gastric polyp showing foveolar hyperplasia with focal mucosal ulceration and reactive epithelial changes.  Unremarkable pathology from small intestine biopsies.  EGD 03/10/2016 Dr. Havery Moros: - Esophagogastric landmarks identified. - Esophageal varices  with stigmata of recent banding, improved in size compared to previous. Banded x 4 with deflation. - Portal hypertensive gastritis. - Normal duodenal bulb and second portion of the duodenum. - No specimens collected.  EGD 02/25/2016: - Esophagogastric landmarks identified. - Large esophageal varices, one with red spot. Banded x 7, I suspect variceal bleed is the most likely cause of the patient's symptoms - Clotted and red blood in the entire stomach, cleared as outlined above. - Portal hypertensive gastritis - Normal duodenal bulb and second portion of the duodenum.  Colonoscopy 11/27/2018: Polyps removed.  Unable to retrieve report from [facility].  Pathology showed inflammatory polyp with mucosal ulceration from polyp at anastomosis.  Colonoscopy 11/07/2015: - One 8 mm polyp at the recto-sigmoid colon, removed with a hot snare. Resected and retrieved. - One 6 mm polyp in the sigmoid colon, removed with a cold snare. Resected and retrieved. - One 8 mm polyp at the splenic flexure, removed with a hot snare. Resected and retrieved. - End-to-side ileo-colonic anastomosis, characterized by nodularity. Biopsied. - Multiple 3 to 6 mm polyps at the anastomosis, 2 representative polyps removed with a hot snare. Resected and retrieved. - Diverticulosis in the left colon. - Non-bleeding internal hemorrhoids. - The examination was otherwise normal. - Small mucosal wrent at the rectum likely due to retroflexion, caused diminutive bleeding which has since stopped. -Recall colonoscopy in 1 year  Diagnostic paracentesis 12/19/2018 and 01/08/2019:No SBP. Cytology negative for malignancy  Vital signs in last 24 hours: Pulse Rate:  [70-155] 79 (01/14 0700) Resp:  [13-32] 13 (01/14  0700) BP: (74-159)/(36-113) 93/64 (01/14 0700) SpO2:  [84 %-100 %] 96 % (01/14 0700) FiO2 (%):  [50 %-100 %] 50 % (01/13 1955)   General: Alert, ill appearing male in NAD.  Eyes: No scleral icterus.  Heart:  Irregular rhythm, no murmur.  Pulm: Diminished breath sounds RLL, left lung fields are clear.  Abdomen: Soft, nondistended. Left inguinal hernia palpable, soft with moderate tenderness. Smaller right inguinal hernia with minor tenderness. Hypoactive bowel sounds x 4 quads.  Extremities:  Without edema. Neurologic:  Alert and  oriented x 4. Speech is softly spoken but comprehensible. Moves all extremities weakly.  Psych:  Alert and cooperative. Normal mood and affect.  Intake/Output from previous day: 01/13 0701 - 01/14 0700 In: 450 [IV Piggyback:450] Out: -  Intake/Output this shift: No intake/output data recorded.  Lab Results: Recent Labs    02/16/2020 0420 02/22/20 0456 02/22/20 0600  WBC 9.2 21.2* 20.6*  HGB 15.8 13.2 13.1  HCT 46.1 37.6* 39.1  PLT 177 176 161   BMET Recent Labs    02/22/2020 0420 02/22/20 0600  NA 131* 135  K 5.5* 5.0  CL 96* 102  CO2 24 20*  GLUCOSE 147* 166*  BUN 32* 49*  CREATININE 1.49* 1.96*  CALCIUM 9.8 8.6*   LFT Recent Labs    02/22/20 0600  PROT 6.4*  ALBUMIN 2.8*  AST 26  ALT 25  ALKPHOS 104  BILITOT 1.6*   PT/INR Recent Labs    02/22/20 0600  LABPROT 16.9*  INR 1.4*   Hepatitis Panel No results for input(s): HEPBSAG, HCVAB, HEPAIGM, HEPBIGM in the last 72 hours.  DG Chest Port 1 View  Result Date: 02/23/2020 CLINICAL DATA:  80 year old male status post right-sided thoracentesis. EXAM: PORTABLE CHEST 1 VIEW COMPARISON:  Earlier radiograph dated 03/05/2020. FINDINGS: Interval decrease in the size of the right pleural effusion and improved in aeration of the right lung. No pneumothorax. Stable cardiac silhouette. No acute osseous pathology. IMPRESSION: Interval decrease in the size of the right pleural effusion and improved aeration of the right lung. No pneumothorax. Electronically Signed   By: Anner Crete M.D.   On: 03/05/2020 15:42   DG Chest Port 1 View  Result Date: 02/17/2020 CLINICAL DATA:  Respiratory distress  EXAM: PORTABLE CHEST 1 VIEW COMPARISON:  February 21, 2020 study obtained earlier in the day FINDINGS: There is persistent large right pleural effusion with areas of suspected atelectasis and potential superimposed consolidation on the right. There is ill-defined airspace opacity throughout the left mid and lower lung regions. The heart is upper normal in size with pulmonary vascularity grossly normal. No adenopathy. There is aortic atherosclerosis. No bone lesions. No pneumothorax. IMPRESSION: Ill-defined airspace opacity left mid and lower lung regions appears slightly more pronounced than on study obtained earlier in the day. Large right pleural effusion again noted with suspected atelectasis and/or consolidation on the right. Stable cardiac silhouette. Aortic Atherosclerosis (ICD10-I70.0). Electronically Signed   By: Lowella Grip III M.D.   On: 02/17/2020 08:06   DG Chest Port 1 View  Result Date: 03/02/2020 CLINICAL DATA:  80 year old male with shortness of breath. Large right pleural effusion. Cirrhosis. EXAM: PORTABLE CHEST 1 VIEW COMPARISON:  Portable chest 02/15/2020, chest CTA 02/09/2020 and earlier. FINDINGS: Portable AP semi upright view at 0709 hours. The patient remains mildly rotated to the right with mildly increased kyphotic positioning. Subtotal opacification of the right hemithorax, sparing the right lung apex, not significantly changed when allowing for differences in patient position from  the CTA on January 1st. Visible mediastinal contours and left lung ventilation appears stable. No pneumothorax. IMPRESSION: 1. Large right pleural effusion not significantly changed from the CTA on 02/09/2020. 2. No new cardiopulmonary abnormality. Electronically Signed   By: Genevie Ann M.D.   On: 03/02/2020 07:21   [place] EKG SITE RITE  Result Date: 02/22/2020 If Site Rite image not attached, placement could not be confirmed due to current cardiac rhythm.  US THORACENTESIS ASP PLEURAL SPACE W/IMG  GUIDE  Result Date: 03/08/2020 INDICATION: Patient with history of colon cancer, portal hypertensive gastropathy, esophageal varices, unspecified cirrhosis, and recurrent right hepatic hydrothorax. Request is made for diagnostic and therapeutic right thoracentesis up to 1.5 L. EXAM: ULTRASOUND GUIDED DIAGNOSTIC AND THERAPEUTIC RIGHT THORACENTESIS MEDICATIONS: 10 mL 1% lidocaine COMPLICATIONS: None immediate. PROCEDURE: An ultrasound guided thoracentesis was thoroughly discussed with the patient and questions answered. The benefits, risks, alternatives and complications were also discussed. The patient understands and wishes to proceed with the procedure. Written consent was obtained. Ultrasound was performed to localize and mark an adequate pocket of fluid in the right chest. The area was then prepped and draped in the normal sterile fashion. 1% Lidocaine was used for local anesthesia. Under ultrasound guidance a 6 Fr Safe-T-Centesis catheter was introduced. Thoracentesis was performed. The catheter was removed and a dressing applied. FINDINGS: A total of approximately 1.5 L of hazy gold fluid was removed. samples were sent to the laboratory as requested by the clinical team. IMPRESSION: Successful ultrasound guided right thoracentesis yielding 1.5 L of pleural fluid. Read by: Earley Abide, PA-C Electronically Signed   By: Miachel Roux M.D.   On: 02/27/2020 15:50    Assessment / Plan: 80.  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 MELD 18. MELD-Na 20. Normal Alk phos, AS/ALT levels with declining synthetic function.  Albumin 2.8. T. bil 1.6. INR 1.4. S/P thoracentesis 1/13, 1.5L hazy gold pleural fluid removed. Cytology pending. Pleural T. Protein level < 3.0. Pleural albumin level 1.2. SPAG > 1.1 which is  consistent with hepatic hydrothorax. Pleural fluid cell count with diff not done. Culture and cytology pending.  -Patient is unlikely candidate for TIPs at this time -Hold diuretics secondary to hypotension on Neo gtt -Pleural fluid cell count and diff, add to specimen collected at time of thoracentesis 1/13 -Further recommendations per Dr. Bryan Lemma  -Consider palliative care consult   2. Encephalopathy, metabolic vs hepatic. Ammonia level 26. Patient is alert at his time without over encephalopathy.  -? Lactulose. Await recommendations per Dr. Bryan Lemma   3. Coagulopathy secondary to # 1. INR 1.4  4. Respiratory failure secondary to recurrent right pleural effusion/hepatic hydrothorax, possible aspiration pneumonia verses spontaneous bacteria pleuritis. No longer on Bipap -> Oxygen 4L [place]. On Ceftriaxone and Doxycycline.  -Agree with Ceftriaxone 1gm IV Q 24 hours   5. Leukocytosis most likely due to right pleural effusion. WBC 21.2 -> 20.6. He is afebrile.    6. GERD. Past esophageal stricture. Dysphagia, at risk for aspiration per speech pathologist evaluation SLP during [facility] hospital admission 02/11/2020 -Repeat consult with speech pathologist  -Eventual repeat EGD  -Continue Pantoprazole 34m IV Q 24hrs   7. AKI. Cr 1.49 ->1.96.  -Urine sodium level  -Monitor renal status closely   8. Hypoalbuminemia.  Albumin 25gm IV x 1 for now  9.  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   10. Atrial fibrillation with RVR, Diltiazem discontinued with SBP in the low 90's and HR now in the 80's. On Midodrine.  11. History of Parkinson's disease.   12. Bilateral inguinal hernias, L > R    Principal Problem:   Acute hypoxemic respiratory failure (HCC) Active Problems:   Hyperlipidemia   Atrial fibrillation with RVR (HCC)   Esophageal varices determined by endoscopy ([place])   Pleural effusion on right   Respiratory distress   Inguinal  hernia     LOS: 1 day   [name]  02/22/2020, 7:37 AM

## 2020-02-22 NOTE — Consult Note (Addendum)
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 He was originally admitted for a week between February 09, 2020 to February 16, 2020 with concern for aspiration but evaluation showed large to moderate size right pleural effusion results of which were transudate consistent with hepatic hydrothorax swallow eval's only showed mild aspiration risk. He was discharged on sodium restriction with consideration of repeat thoracentesis and potential need for TIPS with Dr. Patsy [place] hematologist and IR specialist at [facility] in [place] in the future at this discharge he was also started on midodrine due to soft blood pressures. His diuresis with Lasix and Aldactone was stopped because of hyponatremia and subsequently restarted   He represented on Mar 10, 2020 with inguinal hernia that was reduced and according to the referring hospitalist he was being readied for discharge at which time he might of choked on food and aspirated and developed respiratory distress. He was started on BiPAP and also given lorazepam after which she became somewhat somnolent. Chest x-ray showed recurrence of the large right pleural effusion. Pulmonary medicine  critical care medicine has not been consulted. Patient is DNR according to the hospitalist   CHILD SCORE - 9 points, class B (assumes current  Mild to moder enceph and pleural effusion as ascited)  MELD SCORE - 22 ponts (20% , 3 month mortality)   Past Medical History:    has a past medical history of Anemia, Aortic valve disorder (07/17/2018), Arthritis, Atrial fibrillation ([place]) (2006), Bleeding esophageal varices ([place]), BPH (benign prostatic hypertrophy), CAD (coronary artery disease), CAP (community acquired pneumonia) (02/12/2013), Cerebellar ataxia in diseases classified elsewhere ([place]) (10/24/2017), Disorder of bilirubin excretion, Diverticulosis, ERECTILE DYSFUNCTION, 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), Esophageal stricture, Esophageal varices determined by endoscopy ([place]) (sept 2016), Fibrosis of liver, Fracture of distal femur ([place]) (11/29/2013), GERD (gastroesophageal reflux disease) (03-31-11), 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), Hernia, abdominal, History of colon cancer (02/25/2016), History of esophageal varices, History of hemorrhoids, History of irregular heartbeat, Hypercholesterolemia, HYPERGLYCEMIA, FASTING (12/13/2007), Hyperlipidemia, Malignant neoplasm of ascending colon ([place]) (03/03/2011), Maxillary sinusitis, acute (05/06/2015), 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   has a past surgical history that includes Laparoscopic partial colectomy  (04/08/2011); Colon surgery (2013); Cardiac catheterization (07/2002); Portacath placement (04/30/2011); Port-a-cath removal (11/09/2011); Appendectomy (03/2011); ORIF distal femur fracture (Right, 11/29/2013); Inguinal hernia repair (Left, ~ 1991); ORIF femur fracture (Right, 11/29/2013); Colonoscopy (sept 2016); Esophageal dilation (2006); Esophagogastroduodenoscopy (egd) with propofol (N/A, 02/25/2016); Esophagogastroduodenoscopy (egd) with  propofol (N/A, 03/10/2016); gastric varices banding (N/A, 03/10/2016); and Thoracentesis (Right, 02/11/2020).    Significant Hospital Events:  02/29/2020 - admot 1/13 -ccm consult  Consults:  2020/02/29 - ccm  Procedures:  2020/02/29 - rt thora planned  Significant Diagnostic Tests:  2020-02-29 - thora right  Micro Data:  1/13 - pleura  Antimicrobials:   Ceftriaxone 1/13 Doxy 1/13 Unasyn 1/13 - 1/13     Interim History / Subjective:  02-29-2020 -  Still in [facility] ER Resus A. On 4L [place]. Still on neo 144mcg. Worsening renal failure creat 1.96mg %. Not making muc urine per RN  Objective   Blood pressure (!) 94/57, pulse 87, temperature 97.7 F (36.5 C), temperature source Oral, resp. rate (!) 21, height 6\' 2"  (1.88 m), weight 57.6 kg, SpO2 93 %.    FiO2 (%):  [50 %] 50 %   Intake/Output Summary (Last 24 hours) at 02/22/2020 0929 Last data filed at 29-Feb-2020 1348 Gross per 24 hour  Intake 350 ml  Output -  Net 350 ml   Filed Weights   02-29-2020 0134  Weight: 57.6 kg   General Appearance:  Looks criticall ill. Very cachectic Head:  Normocephalic, without obvious abnormality, atraumatic Eyes:  PERRL - yes, conjunctiva/corneas - mudd     Ears:  Normal external ear canals, both ears Nose:  G tube - no but ha [place] Throat:  ETT TUBE - no , OG tube - no Neck:  Supple,  No enlargement/tenderness/nodules Lungs: Clear to auscultation bilaterally, Heart:  S1 and S2 normal, no murmur, CVP - x.  Pressors - neo Abdomen:  Soft, no masses, no  organomegaly Genitalia / Rectal:  Not done Extremities:  Extremities- intact Skin:  ntact in exposed areas . Sacral area - x Neurologic:  Sedation - none -> RASS - 0 . Moves all 4s - yes. CAM-ICU - neg . Orientation - sems oriented. Normal  slurrted speech. RT RUE tremor +     Resolved Hospital Problem list   x  Assessment & Plan:  Acute hypoxemic respiratory failure and respiratory distress -   -In the setting of significant right hepatic hydrothorax [personally visualized chest x-ray [  -Possible recurrent aspiration in the ER [similar to February 09, 2020 but speech eval in the last 1 week which only showed mild aspiration risk]   S/p Rt thora 29-Feb-2020 and is TRANsUDATE  02/22/2020 - off bipap after thor and on 4L [place]  Plan  - pulse ox goa> 88%  - thora for dyspnea releif - needs TIPS    Acute encephalopathy in the setting of respiratory distress following lorazepam   02/22/2020 - improved/resp;ved  Plan  - needs lactulose +/- rifxamine - await GI opininon   Child B cirrhosis with meld score 22 on 1/13.22  02/22/2020  - rising creat increases meld score  Plan - per GI - TIPS by IR when stable    Circulatory shock  02/22/2020 - on neo. Noticed to be on midodrine at home  Plan  - MAP goal > 65  - cotninue neo  - start mididrone  - check cortisol and consider hhydrocort - albumin x 1   AKI  02/22/2020  Getting worse  Plan  - see neo + midodrine and albumin can help  - renal consult based on reuslts   Possible aspiration pna/  Plan  - continue ceftraixone  Best practice:  Diet:  Speech eval needd. NPO except meds Pain/Anxiety/Delirium protocol (if indicated): avoid benzop VAP protocol (if indicated): x  DVT prophylaxis: heparin sq GI prophylaxis: ppi Glucose control: ssi Mobility:  Bed rest Code Status: partial Family Communication: wife Osualdo Tavis [phone] Disposition: still in ER -> go to ICU   Goals of Care:   lmited code as of  03/08/2020 > wife updated 02/22/20    ATTESTATION & SIGNATURE   The patient [name] is critically ill with multiple organ systems failure and requires high complexity decision making for assessment and support, frequent evaluation and titration of therapies, application of advanced monitoring technologies and extensive interpretation of multiple databases.   Critical Care Time devoted to patient care services described in this note is  45  Minutes. This time reflects time of care of this signee Dr Brand Males. This critical care time does not reflect procedure time, or teaching time or supervisory time of PA/NP/Med student/Med Resident etc but could involve care discussion time     Dr. Brand Males, M.D., [facility].C.P Pulmonary and Critical Care Medicine Staff Physician [facility] Pager: [phone], If no answer or between  15:00h - 7:00h: call [phone]  02/22/2020 10:14 AM     LABS    PULMONARY Recent Labs  Lab 02/09/2020 0845 03/06/2020 1008  PHART  --  7.428  PCO2ART  --  31.5*  PO2ART  --  59.9*  HCO3 22.4 20.6  O2SAT 47.1 91.2    CBC Recent Labs  Lab 03/01/2020 0420 02/22/20 0456 02/22/20 0600  HGB 15.8 13.2 13.1  HCT 46.1 37.6* 39.1  WBC 9.2 21.2* 20.6*  PLT 177 176 161    COAGULATION Recent Labs  Lab 02/22/20 0600  INR 1.4*    CARDIAC  No results for input(s): TROPONINI in the last 168 hours. No results for input(s): PROBNP in the last 168 hours.   CHEMISTRY Recent Labs  Lab 02/23/2020 0420 02/22/20 0600  NA 131* 135  K 5.5* 5.0  CL 96* 102  CO2 24 20*  GLUCOSE 147* 166*  BUN 32* 49*  CREATININE 1.49* 1.96*  CALCIUM 9.8 8.6*   Estimated Creatinine Clearance: 25.3 mL/min (A) (by C-G formula based on SCr of 1.96 mg/dL (H)).   LIVER Recent Labs  Lab 03/06/2020 0420 02/22/20 0600  AST 26 26  ALT 22 25  ALKPHOS 123 104  BILITOT 2.4* 1.6*  PROT 8.0 6.4*  ALBUMIN 3.6 2.8*  INR   --  1.4*     INFECTIOUS No results for input(s): LATICACIDVEN, PROCALCITON in the last 168 hours.   ENDOCRINE CBG (last 3)  Recent Labs    02/22/20 0428  GLUCAP 150*         IMAGING x48h  - image(s) personally visualized  -   highlighted in bold DG Chest Port 1 View  Result Date: 02/16/2020 CLINICAL DATA:  80 year old male status post right-sided thoracentesis. EXAM: PORTABLE CHEST 1 VIEW COMPARISON:  Earlier radiograph dated 02/18/2020. FINDINGS: Interval decrease in the size of the right pleural effusion and improved in aeration of the right lung. No pneumothorax. Stable cardiac silhouette. No acute osseous pathology. IMPRESSION: Interval decrease in the size of the right pleural effusion and improved aeration of the right lung. No pneumothorax. Electronically Signed   By: Anner Crete M.D.   On: 02/17/2020 15:42   DG Chest Port 1 View  Result Date: 03/09/2020 CLINICAL DATA:  Respiratory distress EXAM: PORTABLE CHEST 1 VIEW COMPARISON:  February 21, 2020 study obtained earlier in the day FINDINGS: There is  persistent large right pleural effusion with areas of suspected atelectasis and potential superimposed consolidation on the right. There is ill-defined airspace opacity throughout the left mid and lower lung regions. The heart is upper normal in size with pulmonary vascularity grossly normal. No adenopathy. There is aortic atherosclerosis. No bone lesions. No pneumothorax. IMPRESSION: Ill-defined airspace opacity left mid and lower lung regions appears slightly more pronounced than on study obtained earlier in the day. Large right pleural effusion again noted with suspected atelectasis and/or consolidation on the right. Stable cardiac silhouette. Aortic Atherosclerosis (ICD10-I70.0). Electronically Signed   By: Lowella Grip III M.D.   On: 03/05/2020 08:06   DG Chest Port 1 View  Result Date: 02/23/2020 CLINICAL DATA:  80 year old male with shortness of breath. Large right  pleural effusion. Cirrhosis. EXAM: PORTABLE CHEST 1 VIEW COMPARISON:  Portable chest 02/15/2020, chest CTA 02/09/2020 and earlier. FINDINGS: Portable AP semi upright view at 0709 hours. The patient remains mildly rotated to the right with mildly increased kyphotic positioning. Subtotal opacification of the right hemithorax, sparing the right lung apex, not significantly changed when allowing for differences in patient position from the CTA on January 1st. Visible mediastinal contours and left lung ventilation appears stable. No pneumothorax. IMPRESSION: 1. Large right pleural effusion not significantly changed from the CTA on 02/09/2020. 2. No new cardiopulmonary abnormality. Electronically Signed   By: Genevie Ann M.D.   On: 02/13/2020 07:21   [place] EKG SITE RITE  Result Date: 02/22/2020 If Site Rite image not attached, placement could not be confirmed due to current cardiac rhythm.  US THORACENTESIS ASP PLEURAL SPACE W/IMG GUIDE  Result Date: 02/24/2020 INDICATION: Patient with history of colon cancer, portal hypertensive gastropathy, esophageal varices, unspecified cirrhosis, and recurrent right hepatic hydrothorax. Request is made for diagnostic and therapeutic right thoracentesis up to 1.5 L. EXAM: ULTRASOUND GUIDED DIAGNOSTIC AND THERAPEUTIC RIGHT THORACENTESIS MEDICATIONS: 10 mL 1% lidocaine COMPLICATIONS: None immediate. PROCEDURE: An ultrasound guided thoracentesis was thoroughly discussed with the patient and questions answered. The benefits, risks, alternatives and complications were also discussed. The patient understands and wishes to proceed with the procedure. Written consent was obtained. Ultrasound was performed to localize and mark an adequate pocket of fluid in the right chest. The area was then prepped and draped in the normal sterile fashion. 1% Lidocaine was used for local anesthesia. Under ultrasound guidance a 6 Fr Safe-T-Centesis catheter was introduced. Thoracentesis was performed. The  catheter was removed and a dressing applied. FINDINGS: A total of approximately 1.5 L of hazy gold fluid was removed. samples were sent to the laboratory as requested by the clinical team. IMPRESSION: Successful ultrasound guided right thoracentesis yielding 1.5 L of pleural fluid. Read by: Earley Abide, PA-C Electronically Signed   By: Miachel Roux M.D.   On: 02/12/2020 15:50

## 2020-02-22 NOTE — Progress Notes (Signed)
Noted patient on pressors. Communicated with Dr Belva Crome. Will transfer to [facility] service.

## 2020-02-22 NOTE — Consult Note (Signed)
Renal Service Consult Note [name]  [name] 02/22/2020 Sol Blazing, MD Requesting Physician: Dr [name], [name].   Reason for Consult: Renal failure HPI: The patient is a 80 y.o. 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 He presented on 1/13 w/ left groin pain. They found an inguinal hernia and reduced it in the ED. Patient ate then choked on his food/ drink and had desat episodes into the low 80's. He improved w/ NRB mask, then required BiPap. Also found to have afib w/ RVR and started on dilt IV gtt in ED.  CXR showed new opacities in the L mid/lower lung regions and possible consolidation on the right. Pt was started on IV abx w/ Unasyn, also get diltiazem IV bolus and drip. Creat was up at 1.49, baseline 0.9- 1.1. Pt was admitted. Asked to see for renal failure.   Pt seen in ED, he is comfortable, no pain or SOB , on nasal O2. Vague historian. See ROS. No voiding issues.   Pt was just admitted 1/1- 1/08 for large R pleural effusion w/ mediastinal shift. The fluid was removed w/ thoracentesis , but reaccumulated quickly.  GI consulted and he was started on diuretics and worked up for possible TIPS procedure, also started on midodrine for hypotension. Eventually had a 2nd thoracentesis w/o reaccumulation, and was dc'd home.    ROS  denies CP  no joint pain   no HA  no blurry vision  no rash  no diarrhea  no nausea/ vomiting  no dysuria  no difficulty voiding  no change in urine color    Past Medical History  Past Medical History:  Diagnosis Date  . Anemia   . Aortic valve disorder 07/17/2018  . Arthritis    "thumbs" (11/29/2013)  . Atrial fibrillation [facility]) 2006   Dr Tonny Bollman af-no meds  . Bleeding esophageal varices ([place])   . BPH (benign prostatic hypertrophy)   . CAD (coronary artery disease)    Mild by Cath 2004. Calcification  with mild irregularity.  30% diagonal.  Cardiolite 8/13 normal EF and negative for ischemia (chronically positive ETT)   . CAP (community acquired pneumonia) 02/12/2013  . Cerebellar ataxia in diseases classified elsewhere ([place]) 10/24/2017  . Disorder of bilirubin excretion   . Diverticulosis    Sigmoid colon  . 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 Esophageal stricture   . Esophageal varices determined by endoscopy ([place]) sept 2016  . Fibrosis of liver   . Fracture of distal femur ([place]) 11/29/2013  . GERD (gastroesophageal reflux disease) 03-31-11   tx. Omeprazole  . 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 Hernia, abdominal    at the umbilicus  . History of colon cancer 02/25/2016  . History of esophageal varices   . History of hemorrhoids   . History of irregular heartbeat   . Hypercholesterolemia   . HYPERGLYCEMIA, FASTING 12/13/2007  . Hyperlipidemia   . Malignant neoplasm of ascending colon ([place]) 03/03/2011   s/p right colectomy 04/08/11 Dr. Barkley Bruns Genetic counseling recommendations:  SURVEILLANCE:  Because of the increased risk for cancer in you and your family which we cannot yet define through genetic testing, we urge you and family members to pursue screenings that are recommended for individuals at increased risk of Lynch syndrome (HNPCC)-associated cancers. The following measures are recommend  . Maxillary sinusitis, acute 05/06/2015  .  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 Nephrolithiasis   . Neuropathy 2013   as a result of chemo  . Obstructive sleep apnea 12/14/2007  . OSA on CPAP dx'd 2010   uses cap nightly  . Oxygen deficiency   . Parkinsonism ([place]) 10/24/2017  . Personal history of colonic polyps 09/17/2009   Tubular adenoma  . Pneumonia 01/2013  . 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 Portal hypertension with esophageal  varices ([place]) 10/24/2017  . Resting tremor 10/24/2017  . Shortness of breath at rest   . Sleep apnea    uses c pap at home  . TBI (traumatic brain injury) ([place]) 1986   S/P fall; "slow to get going q morning" (10//22/2015)  . Thrombocytopenia ([place]) 02/25/2016  . Titubation 10/24/2017  . Upper GI bleed 02/25/2016  . Viral URI with cough 01/24/2013   Past Surgical History  Past Surgical History:  Procedure Laterality Date  . APPENDECTOMY  03/2011  . CARDIAC CATHETERIZATION  07/2002   Dr Wynonia Lawman  . COLON SURGERY  2013  . COLONOSCOPY  sept 2016   upper endo and colonoscopy  . ESOPHAGEAL DILATION  2006  . ESOPHAGOGASTRODUODENOSCOPY (EGD) WITH PROPOFOL N/A 02/25/2016   Procedure: ESOPHAGOGASTRODUODENOSCOPY (EGD) WITH PROPOFOL;  Surgeon: Manus Gunning, MD;  Location: [facility];  Service: Gastroenterology;  Laterality: N/A;  . ESOPHAGOGASTRODUODENOSCOPY (EGD) WITH PROPOFOL N/A 03/10/2016   Procedure: ESOPHAGOGASTRODUODENOSCOPY (EGD) WITH PROPOFOL;  Surgeon: Manus Gunning, MD;  Location: [facility];  Service: Gastroenterology;  Laterality: N/A;  . GASTRIC VARICES BANDING N/A 03/10/2016   Procedure: GASTRIC VARICES BANDING;  Surgeon: Manus Gunning, MD;  Location: [facility];  Service: Gastroenterology;  Laterality: N/A;  . INGUINAL HERNIA REPAIR Left ~ 1991  . LAPAROSCOPIC PARTIAL COLECTOMY  04/08/2011  . ORIF DISTAL FEMUR FRACTURE Right 11/29/2013   "put plate in"  . ORIF FEMUR FRACTURE Right 11/29/2013   Procedure: OPEN REDUCTION INTERNAL FIXATION (ORIF) RIGHT  DISTAL FEMUR FRACTURE;  Surgeon: Rozanna Box, MD;  Location: [place];  Service: Orthopedics;  Laterality: Right;  . PORT-A-CATH REMOVAL  11/09/2011   Procedure: REMOVAL PORT-A-CATH;  Surgeon: Odis Hollingshead, MD;  Location: [place];  Service: General;  Laterality: N/A;  . PORTACATH PLACEMENT  04/30/2011   Procedure: INSERTION PORT-A-CATH;  Surgeon: Odis Hollingshead, MD;  Location: [place];   Service: General;  Laterality: Right;  . THORACENTESIS Right 02/11/2020   Procedure: THORACENTESIS;  Surgeon: Margaretha Seeds, MD;  Location: [place];  Service: Pulmonary;  Laterality: Right;   Family History  Family History  Problem Relation Age of Onset  . Diabetes Father   . Prostate cancer Father   . Heart attack Father 37  . Heart failure Mother        Congestive heart failure  . Cancer Brother        colon  . Colon cancer Brother 51  . Stomach cancer Sister   . Colon cancer Other   . Colon cancer Sister   . Coronary artery disease Brother        CABG  . Lung cancer Brother   . Anesthesia problems Neg Hx   . Esophageal cancer Neg Hx    Social History  reports that he quit smoking about 58 years ago. His smoking use included cigarettes. He has a 3.50 pack-year smoking history. He has never used smokeless  tobacco. He reports that he does not drink alcohol and does not use drugs. Allergies No Known Allergies Home medications Prior to Admission medications   Medication Sig Start Date End Date Taking? Authorizing Provider  acetaminophen (TYLENOL) 500 MG tablet Take 1,000 mg by mouth every 6 (six) hours as needed for mild pain or headache.   Yes [provider]  amphetamine-dextroamphetamine (ADDERALL) 30 MG tablet Take 30 mg by mouth daily as needed (for awake in the morning). 12/30/10  Yes [provider]  Artificial Tear Ointment (DRY EYES OP) Apply 1 drop to eye daily.   Yes [provider]  carbidopa-levodopa (SINEMET CR) 50-200 MG tablet TAKE 1 TABLET BY MOUTH TWICE DAILY 1 HOUR BEFORE BREAKFAST AND BEFORE DINNER Patient taking differently: Take 1 tablet by mouth 2 (two) times daily. 01/01/20  Yes Dohmeier, Asencion Partridge, MD  carboxymethylcellulose (REFRESH PLUS) 0.5 % SOLN Place 1 drop into both eyes 3 (three) times daily as needed (dry eyes).   Yes [provider]  ferrous sulfate 325 (65 FE) MG tablet Take 325 mg by mouth 2 (two) times  daily.   Yes [provider]  fluticasone (FLONASE) 50 MCG/ACT nasal spray Place 2 sprays into both nostrils at bedtime as needed for allergies or rhinitis.   Yes [provider]  furosemide (LASIX) 40 MG tablet Take 40 mg by mouth daily.    Yes [provider]  ipratropium (ATROVENT) 0.06 % nasal spray Place into both nostrils. 02/19/20  Yes [provider]  midodrine (PROAMATINE) 5 MG tablet Take 1 tablet (5 mg total) by mouth 3 (three) times daily with meals. 02/16/20 03/17/20 Yes Pahwani, Einar Grad, MD  pantoprazole (PROTONIX) 40 MG tablet Take 1 tablet (40 mg total) by mouth 2 (two) times daily. 02/29/16  Yes Donne Hazel, MD  propranolol (INDERAL) 20 MG tablet Take 1 tablet (20 mg total) by mouth 2 (two) times daily. 02/29/16  Yes Donne Hazel, MD  spironolactone (ALDACTONE) 100 MG tablet Take 100 mg by mouth daily.  07/18/18  Yes [provider]     Vitals:   02/22/20 1000 02/22/20 1030 02/22/20 1058 02/22/20 1148  BP: (!) 109/59 112/73 103/63 108/68  Pulse: (!) 104 (!) 105 (!) 107 98  Resp: 17 (!) 21 (!) 23 (!) 22  Temp:   98.1 F (36.7 C) 98.3 F (36.8 C)  TempSrc:   Axillary Axillary  SpO2: 92% 91% 91% 94%  Weight:      Height:       Exam Gen alert, HOH, difficulty to communicate with, Ox 3 No rash, cyanosis or gangrene Sclera anicteric, throat clear  No jvd or bruits Chest L lung clear, R reduced BS 1/2 up Cor irreg irreg , no RG Abd soft ntnd no mass, NO ascites, scaphoid abdomen GU normal male  MS no joint effusions or deformity Ext no leg or UE edema, no wounds or ulcers Neuro is alert, Ox 3 , nf, no asterixis    Home meds:  - adderall 30 qd/ sinemet CR 50-200 bid  - aldactone 100 qd/ lasix 40 qd/ propranolol 20 bid  - protonix 40 bid  - prn's/ vitamins/ supplements    Date   Creat  eGFR   2008- 2020  0.86- 1.10 73   Feb 09, 2020  1.40  51   Jan 5   1.26   Jan 7   1.46   Feb 21, 2020  1.49  48   Feb 22, 2020  1.96        Last UA 2016 - negative   UA here - pend   UNa, UCr - pend   Na 135 K 5.0  BUN 49  Cr 1.96  Alb 2.8   CO2 20  WBC 20K Hb 13 plt 176    CXR 1/14 - IMPRESSION: Interval decrease in the size of the right pleural effusion and improved aeration of the right lung. No pneumothorax.     Assessment/ Plan: 1. AKI on CKD II - b/l creat in 2020 was 1.0, eGFR 60- 75 ml/min.  Creat here 1.4 yest and 1.9 today.  Pt came to ED for hernia pain, but was admitted after eating in ED and probably aspirating. Pt has underlying portal HTN w/ esoph varices and portal hypertensive gastropathy. Has had ascites drained in 2019 per pt, not recently.  He has been on lasix/ aldactone at home. In ED BP's dropped and neo gtt started. Also getting po midodrine and IV albumin, agree w/ these measures. He looks dry on exam w/o any ascites or leg edema. Will start IVF's. I believe he is at risk for HRS as well w/ hx portal HTN.  Urine lytes are pending. Cont to hold home BB. Will follow.  2. Portal hypertension - with varices 2/2oxaliplatin induced sinusoidal sclerosis (not cirrhosis) 3. Shock - started on neo gtt and po midodrine. Starting IVF's.  4. Possible asp PNA - on Rocephin       Rob Starsha Morning  MD 02/22/2020, 2:24 PM  Recent Labs  Lab 02/22/20 0456 02/22/20 0600  WBC 21.2* 20.6*  HGB 13.2 13.1   Recent Labs  Lab 02/15/2020 0420 02/22/20 0600  K 5.5* 5.0  BUN 32* 49*  CREATININE 1.49* 1.96*  CALCIUM 9.8 8.6*

## 2020-02-22 NOTE — Progress Notes (Signed)
SLP Cancellation Note  Patient Details Name: [name] MRN: [id number] DOB: 02/18/1940   Cancelled treatment:       Reason Eval/Treat Not Completed: (P)  (Order for swallow eval received after apparent aspiration event, in lieu of bse- recommend to proceed to mbs if MD deems pt appropriate.  Pt has h/o silent aspiration/dysphagia and instrumental eval is warranted.)  Pt does not have a room assignment at this time and location states "none".    Macario Golds 02/22/2020, 3:33 PM

## 2020-02-22 NOTE — ED Notes (Signed)
Attempted report. This RN to call back in 10 mins.

## 2020-02-22 NOTE — Progress Notes (Signed)
RN discussed with Ramawamy-Critical Care MD- about PICC placement order. Patient pressor needs decreasing at this time. Per MD, reassess need for PICC line in the morning. RN to D/C order at this time.

## 2020-02-23 ENCOUNTER — Encounter (HOSPITAL_COMMUNITY): Payer: Self-pay | Admitting: Internal Medicine

## 2020-02-23 ENCOUNTER — Inpatient Hospital Stay (HOSPITAL_COMMUNITY): Payer: Medicare Other

## 2020-02-23 DIAGNOSIS — J90 Pleural effusion, not elsewhere classified: Secondary | ICD-10-CM | POA: Diagnosis not present

## 2020-02-23 DIAGNOSIS — N179 Acute kidney failure, unspecified: Secondary | ICD-10-CM | POA: Diagnosis not present

## 2020-02-23 DIAGNOSIS — Z9889 Other specified postprocedural states: Secondary | ICD-10-CM

## 2020-02-23 DIAGNOSIS — J690 Pneumonitis due to inhalation of food and vomit: Principal | ICD-10-CM

## 2020-02-23 DIAGNOSIS — J9601 Acute respiratory failure with hypoxia: Secondary | ICD-10-CM | POA: Diagnosis not present

## 2020-02-23 DIAGNOSIS — I4891 Unspecified atrial fibrillation: Secondary | ICD-10-CM | POA: Diagnosis not present

## 2020-02-23 LAB — COMPREHENSIVE METABOLIC PANEL
ALT: 22 U/L (ref 0–44)
AST: 20 U/L (ref 15–41)
Albumin: 3 g/dL — ABNORMAL LOW (ref 3.5–5.0)
Alkaline Phosphatase: 81 U/L (ref 38–126)
Anion gap: 10 (ref 5–15)
BUN: 38 mg/dL — ABNORMAL HIGH (ref 8–23)
CO2: 19 mmol/L — ABNORMAL LOW (ref 22–32)
Calcium: 8.8 mg/dL — ABNORMAL LOW (ref 8.9–10.3)
Chloride: 109 mmol/L (ref 98–111)
Creatinine, Ser: 1.29 mg/dL — ABNORMAL HIGH (ref 0.61–1.24)
GFR, Estimated: 57 mL/min — ABNORMAL LOW (ref >60)
Glucose, Bld: 117 mg/dL — ABNORMAL HIGH (ref 70–99)
Potassium: 4.3 mmol/L (ref 3.5–5.1)
Sodium: 138 mmol/L (ref 135–145)
Total Bilirubin: 1.6 mg/dL — ABNORMAL HIGH (ref 0.3–1.2)
Total Protein: 6.1 g/dL — ABNORMAL LOW (ref 6.5–8.1)

## 2020-02-23 LAB — CBC
HCT: 35.6 % — ABNORMAL LOW (ref 39.0–52.0)
Hemoglobin: 12 g/dL — ABNORMAL LOW (ref 13.0–17.0)
MCH: 31.3 pg (ref 26.0–34.0)
MCHC: 33.7 g/dL (ref 30.0–36.0)
MCV: 92.7 fL (ref 80.0–100.0)
Platelets: 110 10*3/uL — ABNORMAL LOW (ref 150–400)
RBC: 3.84 MIL/uL — ABNORMAL LOW (ref 4.22–5.81)
RDW: 15.2 % (ref 11.5–15.5)
WBC: 11.7 10*3/uL — ABNORMAL HIGH (ref 4.0–10.5)
nRBC: 0 % (ref 0.0–0.2)

## 2020-02-23 LAB — ACID FAST SMEAR (AFB, MYCOBACTERIA): Acid Fast Smear: NEGATIVE

## 2020-02-23 MED ORDER — LIP MEDEX EX OINT
TOPICAL_OINTMENT | CUTANEOUS | Status: AC
  Administered 2020-02-23: 1
  Filled 2020-02-23: qty 7

## 2020-02-23 MED ORDER — DILTIAZEM HCL-DEXTROSE 125-5 MG/125ML-% IV SOLN (PREMIX)
5.0000 mg/h | INTRAVENOUS | Status: DC
  Administered 2020-02-23 – 2020-02-24 (×2): 5 mg/h via INTRAVENOUS
  Administered 2020-02-24 – 2020-02-25 (×2): 10 mg/h via INTRAVENOUS
  Filled 2020-02-23 (×5): qty 125

## 2020-02-23 MED ORDER — DILTIAZEM HCL 30 MG PO TABS: 30.0000 mg | ORAL_TABLET | Freq: Four times a day (QID) | ORAL | Status: DC

## 2020-02-23 MED ORDER — LORAZEPAM 2 MG/ML IJ SOLN
0.5000 mg | INTRAMUSCULAR | Status: DC | PRN
  Administered 2020-02-23 – 2020-02-25 (×5): 0.5 mg via INTRAVENOUS
  Filled 2020-02-23 (×5): qty 1

## 2020-02-23 MED ORDER — DILTIAZEM LOAD VIA INFUSION
10.0000 mg | Freq: Once | INTRAVENOUS | Status: AC
  Administered 2020-02-23: 10 mg via INTRAVENOUS
  Filled 2020-02-23: qty 10

## 2020-02-23 NOTE — Progress Notes (Signed)
Per Elink, no cardizem gtt needed at this time, continue to monitor patient.

## 2020-02-23 NOTE — Progress Notes (Signed)
[facility] Progress Note Patient Name: [name] DOB: [birth year] MRN: [id number]   Date of Service  02/23/2020  HPI/Events of Note  Patient was on a Cardizem  Gtt until yesterday but was not continued on maintenance rate control and now has RVR with rate of 110-125. He is on a Neo infusion at 50 mcg / min.  eICU Interventions  Cardizem 30 mg po Q 6 hours ordered.        [name] 02/23/2020, 2:22 AM

## 2020-02-23 NOTE — Progress Notes (Signed)
NAME:  [name], MRN:  [id number], DOB:  09/15/1940, LOS: 2 ADMISSION DATE:  2020/03/11, CONSULTATION DATE:  2020-03-11  REFERRING MD:  Dr Parke Simmers, CHIEF COMPLAINT:  Resp disterss, recurrence R hydrothorax   Brief History:  80 yo male former smoker with non cirrhotic portal hypertension admitted from 1/01 to 1/08 with aspiration and pleural effusion from hepatic hydrothorax.  He presented on 1/13 with inguinal hernia that was reduced, but prior to discharged developed respiratory distress from choking event while eating.  Past Medical History:  Upper GI bleeding, Thrombocytopenia, TBI, OSA, Tremor, Portal hypertension with esophageal varices, Pneumonia, Parkinsonism, Neuropathy, Nephrolithiasis, Colon cancer, HLD, GERD, Diverticulosis, CAD, BPH, A fib  Significant Hospital Events:  1/13 Admit, Rt thoracentesis by IR with 1.5 liters removed 1/15 restart cardizem gtt because he hasn't been able to swallow pills yet  Consults:  GI 1/13 Nephrology 1/14  Procedures:    Significant Diagnostic Tests:    Micro Data:  COVID/Flu 1/13 >> negative Rt pleural fluid 1/13 >> MRSA PCR 1/14 >> negative  Antimicrobials:  Unasyn 1/13 Rocephin 1/13 >> Doxycycline 1/13 >>  Interim History / Subjective:  Was able to get swallow assessment yet.  Had cardizem gtt stopped yesterday with plan to switch to po cardizem, but couldn't take pills.  Now in A fib with RVR.  Objective   Blood pressure (!) 115/59, pulse (!) 114, temperature 97.8 F (36.6 C), temperature source Oral, resp. rate 18, height 6\' 2"  (1.88 m), weight 61.7 kg, SpO2 90 %.        Intake/Output Summary (Last 24 hours) at 02/23/2020 1050 Last data filed at 02/23/2020 0610 Gross per 24 hour  Intake 5288.42 ml  Output 800 ml  Net 4488.42 ml   Filed Weights   2020/03/11 0134 02/22/20 1448  Weight: 57.6 kg 61.7 kg    General - alert, cachectic Eyes - pupils reactive ENT - no sinus tenderness, no stridor Cardiac - irregular,  tachycardic Chest - decreased BS Rt base Abdomen - soft, non tender, + bowel sounds Extremities - decreased muscle bulk Skin - areas of ecchymosis Neuro - follows commands  Resolved Hospital Problem list     Assessment & Plan:   Acute hypoxic respiratory failure from recurrent aspiration pneumonia and transudate Rt pleural effusion. - goal SpO2 > 90% - f/u CXR - day 3 of ABx - might need repeat therapeutic thoracentesis  A fib with RVR. - since he can't swallow pills yet, will resume cardizem gtt - goal HR 65 to 105  Noncirrhotic portal hypertension secondary to oxaliplatin induced sinusodal sclerosis. - GI consulted - IR planning TIPS  - followed by [facility] as outpt  Circulatory shock. - continue midodrine when able to take pills - check cortisol  AKI with CKD 2. Non gap metabolic acidosis. - improving with fluids  Dysphagia, cachexia. - f/u with speech therapy and then advance diet  Thrombocytopenia, mild. - f/u CBC  History of Parkinsonism. - hold outpt sinemet until able to swallow pills  Best practice:  Diet: NPO Pain/Anxiety/Delirium protocol (if indicated): nope VAP protocol (if indicated): not on vent, so no DVT prophylaxis: SQ heparin GI prophylaxis: protonix Glucose control: not mentioned in assessment and plan, and not on any Mobility:  Bed rest Code Status: okay with Bipap and pressors; no CPR, no defibrillation, no intubation Family Communication: wife Andre Swander [phone] Disposition: ICU  LABS   CMP Latest Ref Rng & Units 02/23/2020 02/22/2020 03-11-20  Glucose 70 - 99 mg/dL  117(H) 166(H) 147(H)  BUN 8 - 23 mg/dL 38(H) 49(H) 32(H)  Creatinine 0.61 - 1.24 mg/dL 1.29(H) 1.96(H) 1.49(H)  Sodium 135 - 145 mmol/L 138 135 131(L)  Potassium 3.5 - 5.1 mmol/L 4.3 5.0 5.5(H)  Chloride 98 - 111 mmol/L 109 102 96(L)  CO2 22 - 32 mmol/L 19(L) 20(L) 24  Calcium 8.9 - 10.3 mg/dL 8.8(L) 8.6(L) 9.8  Total Protein 6.5 - 8.1 g/dL 6.1(L)  6.4(L) 8.0  Total Bilirubin 0.3 - 1.2 mg/dL 1.6(H) 1.6(H) 2.4(H)  Alkaline Phos 38 - 126 U/L 81 104 123  AST 15 - 41 U/L 20 26 26   ALT 0 - 44 U/L 22 25 22     CBC Latest Ref Rng & Units 02/23/2020 02/22/2020 02/22/2020  WBC 4.0 - 10.5 K/uL 11.7(H) 20.6(H) 21.2(H)  Hemoglobin 13.0 - 17.0 g/dL 12.0(L) 13.1 13.2  Hematocrit 39.0 - 52.0 % 35.6(L) 39.1 37.6(L)  Platelets 150 - 400 K/uL 110(L) 161 176    ABG    Component Value Date/Time   PHART 7.428 02/24/2020 1008   PCO2ART 31.5 (L) 03/09/2020 1008   PO2ART 59.9 (L) 02/27/2020 1008   HCO3 20.6 03/06/2020 1008   TCO2 22 11/23/2013 1540   ACIDBASEDEF 2.4 (H) 03/04/2020 1008   O2SAT 91.2 03/09/2020 1008    CBG (last 3)  Recent Labs    02/22/20 0428  GLUCAP 150*    Signature:  Chesley Mires, MD [place] Pager - [phone] 02/23/2020, 11:08 AM

## 2020-02-23 NOTE — Progress Notes (Signed)
[facility] ROUNDING NOTE   Subjective: Off Cardizem drip overnight and changed to Cardizem every 6 hours. HR in 110's this a.m. Was seen by Nephrology and IR consult services yesterday. Was given IVF for suspected intravascular depletion, along with continued midodrine and IV albumin. Still on neo.  Renal function improved this morning with creatinine 1.3 (from 1.9 yesterday). T bili stable at 1.6 with otherwise normal liver enzymes. WBC improved to 11.7 (from 20.6). UA largely bland.   Objective: Vital signs in last 24 hours: Temp:  [97.2 F (36.2 C)-98.3 F (36.8 C)] 97.3 F (36.3 C) (01/14 2000) Pulse Rate:  [59-135] 114 (01/15 0615) Resp:  [12-26] 18 (01/15 0615) BP: (90-123)/(41-90) 115/59 (01/15 0615) SpO2:  [87 %-97 %] 90 % (01/15 0615) Weight:  [61.7 kg] 61.7 kg (01/14 1448) Last BM Date:  (PTA) General: Sitting upright, nasal cannula at 5L, good mentation Heart: Tachycardic Abdomen:  Soft, NT, ND, +BS Ext:  No c/c/e    Intake/Output from previous day: 01/14 0701 - 01/15 0700 In: 5288.4 [I.V.:4457.4; IV Piggyback:831] Out: 800 [Urine:800] Intake/Output this shift: No intake/output data recorded.   Lab Results: Recent Labs    02/22/20 0456 02/22/20 0600 02/23/20 0230  WBC 21.2* 20.6* 11.7*  HGB 13.2 13.1 12.0*  PLT 176 161 110*  MCV 89.1 92.7 92.7   BMET Recent Labs    03/06/2020 0420 02/22/20 0600 02/23/20 0230  NA 131* 135 138  K 5.5* 5.0 4.3  CL 96* 102 109  CO2 24 20* 19*  GLUCOSE 147* 166* 117*  BUN 32* 49* 38*  CREATININE 1.49* 1.96* 1.29*  CALCIUM 9.8 8.6* 8.8*   LFT Recent Labs    02/16/2020 0420 02/22/20 0600 02/23/20 0230  PROT 8.0 6.4* 6.1*  ALBUMIN 3.6 2.8* 3.0*  AST 26 26 20   ALT 22 25 22   ALKPHOS 123 104 81  BILITOT 2.4* 1.6* 1.6*   PT/INR Recent Labs    02/22/20 0600  INR 1.4*      Imaging/Other results: US RENAL  Result Date: 02/22/2020 CLINICAL DATA:  Acute kidney injury EXAM: RENAL / URINARY  TRACT ULTRASOUND COMPLETE COMPARISON:  CT abdomen and pelvis 02/14/2020 FINDINGS: Right Kidney: Renal measurements: 9.9 x 4.3 x 4.3 cm = volume: 96 mL. Borderline increased cortical echogenicity. Normal cortical thickness. No mass, hydronephrosis, or shadowing calcification. Left Kidney: Renal measurements: 10.7 x 4.6 x 4.3 cm = volume: 111 mL. Minimal cortical thinning. Increased cortical echogenicity. No mass, hydronephrosis, or shadowing calcification. Bladder: Appears normal for degree of bladder distention. Other: Incidentally noted RIGHT pleural effusion. IMPRESSION: Suspected medical renal disease changes of both kidneys. No evidence of renal mass or hydronephrosis. RIGHT pleural effusion. Electronically Signed   By: Lavonia Dana M.D.   On: 02/22/2020 15:52   DG Chest Port 1 View  Result Date: 02/09/2020 CLINICAL DATA:  80 year old male status post right-sided thoracentesis. EXAM: PORTABLE CHEST 1 VIEW COMPARISON:  Earlier radiograph dated 02/29/2020. FINDINGS: Interval decrease in the size of the right pleural effusion and improved in aeration of the right lung. No pneumothorax. Stable cardiac silhouette. No acute osseous pathology. IMPRESSION: Interval decrease in the size of the right pleural effusion and improved aeration of the right lung. No pneumothorax. Electronically Signed   By: Anner Crete M.D.   On: 03/04/2020 15:42   [place] EKG SITE RITE  Result Date: 02/22/2020 If Site Rite image not attached, placement could not be confirmed due to current cardiac rhythm.  US THORACENTESIS ASP PLEURAL SPACE W/IMG GUIDE  Result Date: 03/07/2020 INDICATION: Patient with history of colon cancer, portal hypertensive gastropathy, esophageal varices, unspecified cirrhosis, and recurrent right hepatic hydrothorax. Request is made for diagnostic and therapeutic right thoracentesis up to 1.5 L. EXAM: ULTRASOUND GUIDED DIAGNOSTIC AND THERAPEUTIC RIGHT THORACENTESIS MEDICATIONS: 10 mL 1% lidocaine  COMPLICATIONS: None immediate. PROCEDURE: An ultrasound guided thoracentesis was thoroughly discussed with the patient and questions answered. The benefits, risks, alternatives and complications were also discussed. The patient understands and wishes to proceed with the procedure. Written consent was obtained. Ultrasound was performed to localize and mark an adequate pocket of fluid in the right chest. The area was then prepped and draped in the normal sterile fashion. 1% Lidocaine was used for local anesthesia. Under ultrasound guidance a 6 Fr Safe-T-Centesis catheter was introduced. Thoracentesis was performed. The catheter was removed and a dressing applied. FINDINGS: A total of approximately 1.5 L of hazy gold fluid was removed. samples were sent to the laboratory as requested by the clinical team. IMPRESSION: Successful ultrasound guided right thoracentesis yielding 1.5 L of pleural fluid. Read by: Earley Abide, PA-C Electronically Signed   By: Miachel Roux M.D.   On: 03/10/2020 15:50      Assessment and Plan:  1) Noncirrhotic portal hypertension 2/2 oxaliplatin induced sinusoidal sclerosis 2) AKI on CKD-improving 3) Hepatic hydrothorax - Renal function improved with gentle IVF and continued midodrine/IV albumin - Appreciate Nephrology input - Case discussed with IR yesterday. Appreciate input. Tentative plan for TIPS in about 2 weeks after resolution of acute issues. In the meantime, can continue therapeutic thoracentesis prn - Holding diuretics - Continued midodrine and IV albumin per Nephrology  -Continue trending renal function - Hepatic serologies otherwise largely stable - Will eventually need close follow-up with [facility] after discharge, who he follows with as an outpatient - Given current clear mentation and suspicion for metabolic encephalopathy (possibly with some overlapping medication induced encephalopathy), do not feel that he needs lactulose/rifaximin  4)  Encephalopathy-improved 5) Respiratory failure with possible aspiration PNA vs spontaneous bacterial pleuritis 6) Leukocytosis-improving 7) Shock physiology - Continued antimicrobial therapy per primary CCS - Was previously on BiPAP; currently on 5L [place] - Plan for recurrent hepatic hydrothorax as above - Vasopressor support per CCS  8) A. fib with RVR - Now off Cardizem gtt  GI service will follow peripherally through the remainder of the weekend. If still inpatient, will plan to round again on Monday.   Lavena Bullion, DO  02/23/2020, 9:23 AM [facility] Pager [phone]

## 2020-02-23 NOTE — Progress Notes (Addendum)
Peripherally Inserted Central Catheter Placement  The IV Nurse has discussed with the patient and/or persons authorized to consent for the patient, the purpose of this procedure and the potential benefits and risks involved with this procedure.  The benefits include less needle sticks, lab draws from the catheter, and the patient may be discharged home with the catheter. Risks include, but not limited to, infection, bleeding, blood clot (thrombus formation), and puncture of an artery; nerve damage and irregular heartbeat and possibility to perform a PICC exchange if needed/ordered by physician.  Alternatives to this procedure were also discussed.  Bard Power PICC patient education guide, fact sheet on infection prevention and patient information card has been provided to patient /or left at bedside. Son at bedside, signed consent.   PICC Placement Documentation  PICC Triple Lumen 60/63/01 Right Basilic 40 cm 0 cm (Active)  Indication for Insertion or Continuance of Line Vasoactive infusions;Prolonged intravenous therapies 02/23/20 1825  Exposed Catheter (cm) 0 cm 02/23/20 1825  Site Assessment Dry;Clean;Intact 02/23/20 1825  Lumen #1 Status Flushed;Saline locked;Blood return noted 02/23/20 1825  Lumen #2 Status Saline locked;Flushed;Blood return noted 02/23/20 1825  Lumen #3 Status Flushed;Saline locked;Blood return noted 02/23/20 1825  Dressing Type Transparent 02/23/20 1825  Dressing Status Clean;Dry;Intact 02/23/20 1825  Antimicrobial disc in place? Yes 02/23/20 1825  Safety Lock Not Applicable 60/10/93 2355  Line Care Connections checked and tightened 02/23/20 1825  Line Adjustment (NICU/IV Team Only) No 02/23/20 1825  Dressing Intervention New dressing 02/23/20 1825  Dressing Change Due 03/01/20 02/23/20 1825       Rolena Infante 02/23/2020, 6:29 PM

## 2020-02-23 NOTE — Progress Notes (Signed)
Spoke with [facility] RN re PICC order.  States pt has adequate PIV access at this time and pressors have been stopped currently.  Will reassess need for PICC today with MD.

## 2020-02-23 NOTE — Progress Notes (Signed)
Spoke with pt's son, [name].  He was visiting his father this afternoon.  [name] informed his son that he is tired and doesn't think he can continue with therapies anymore.  Had detailed discussion with [name] about [name] current status, but then also what to likely expected going ahead.  [name] had progressively worse quality of life prior to recent rounds of hospitalizations, and continues to get worse.  [name] asked if we could get the palliative care team to meet with the family and review different options, including possibly transitioning to hospice care.    I have placed consult with palliative care.  Time spent 14 minutes.  Russell Mires, MD [place] Pager - [phone] 02/23/2020, 6:20 PM

## 2020-02-23 NOTE — Progress Notes (Signed)
SLP Cancellation Note  Patient Details Name: [name] MRN: [id number] DOB: 1940-11-13   Cancelled evaluation:     Pt was scheduled for MBS at 1300 today; called by Radiology and informed study cancelled as pt now on BiPAP. Will continue efforts and f/u next date.  [name] L. [name] Ringer, [name] CCC/SLP Acute Rehabilitation Services Office number [phone] Pager [phone]      Assunta Curtis 02/23/2020, 12:51 PM

## 2020-02-23 NOTE — Progress Notes (Addendum)
[facility] Progress Note  Subjective: no c/o's per pt, coughing up "food"  Vitals:   02/23/20 0530 02/23/20 0545 02/23/20 0600 02/23/20 0615  BP: (!) 104/54 (!) 110/55 106/61 (!) 115/59  Pulse: (!) 115 (!) 113 (!) 118 (!) 114  Resp: '17 19 19 18  ' Temp:      TempSrc:      SpO2: 91% (!) 89% 90% 90%  Weight:      Height:        Exam: Gen alert, HOH, Ox 3, chron ill appearing, cachectic No jvd or bruits Chest L lung clear, R reduced BS 1/2 up Cor irreg irreg , no RG Abd soft ntnd no mass, no ascites Ext no leg or UE edema Neuro is alert, Ox 3 , nf    Home meds:  - adderall 30 qd/ sinemet CR 50-200 bid  - aldactone 100 qd/ lasix 40 qd/ propranolol 20 bid  - protonix 40 bid  - prn's/ vitamins/ supplements    Date                           Creat               eGFR   2008- 2020                0.86- 1.10        73   Feb 09, 2020               1.40                 51   Jan 5                         1.26   Jan 7                         1.46   Feb 21, 2020             1.49                 48   Feb 22, 2020             1.96            UA hazy, prot neg, no rbc/ wbc    CXR 1/14 - IMPRESSION: Interval decrease in the size of the right pleural effusion and improved aeration of the right lung. No pneumothorax.     UNa 26, Creat 155  Assessment/ Plan: 1. AKI on CKD II - b/l creat in 2020 was 1.0, eGFR 60- 75 ml/min.  Creat here 1.4 on admit > 1.9 yest and down to 1.2 today w/ IVF"s. AKI due to dehydration, doubt HRS at this time. Improving w/ fluids. Will cont IVF"s, f/u labs in am, cont to hold diuretics.   2. 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 Has had ascites drained in 2019 per pt, not recently.  He has been on lasix/ aldactone at home. Now on hold.   3. Recurrent pleural effusion - related to portal HTN, being eval for possible TIPS procedure.  4. Shock - on  midodrine, IVF"s and neo gtt down to 30 ug  5. Aspiration PNA - on Rocephin     Rob Seleen Walter 02/23/2020, 9:15 AM   Recent Labs  Lab 02/22/20  0600 02/23/20 0230  K 5.0 4.3  BUN 49* 38*  CREATININE 1.96* 1.29*  CALCIUM 8.6* 8.8*  HGB 13.1 12.0*   Inpatient medications: . Chlorhexidine Gluconate Cloth  6 each Topical Daily  . diltiazem  30 mg Oral Q6H  . heparin  5,000 Units Subcutaneous Q8H  . mouth rinse  15 mL Mouth Rinse BID  . midodrine  10 mg Oral TID WC  . pantoprazole (PROTONIX) IV  40 mg Intravenous Daily  . sodium chloride flush  3 mL Intravenous Q12H   . sodium chloride 250 mL (03/01/2020 1420)  . sodium chloride 75 mL/hr at 02/23/20 0200  . cefTRIAXone (ROCEPHIN)  IV Stopped (02/22/20 1040)  . doxycycline (VIBRAMYCIN) IV Stopped (02/23/20 0805)  . phenylephrine (NEO-SYNEPHRINE) Adult infusion 50 mcg/min (02/23/20 0445)   ondansetron **OR** ondansetron (ZOFRAN) IV

## 2020-02-24 ENCOUNTER — Inpatient Hospital Stay (HOSPITAL_COMMUNITY): Payer: Medicare Other

## 2020-02-24 DIAGNOSIS — J9601 Acute respiratory failure with hypoxia: Secondary | ICD-10-CM | POA: Diagnosis not present

## 2020-02-24 DIAGNOSIS — N179 Acute kidney failure, unspecified: Secondary | ICD-10-CM | POA: Diagnosis not present

## 2020-02-24 DIAGNOSIS — I4891 Unspecified atrial fibrillation: Secondary | ICD-10-CM | POA: Diagnosis not present

## 2020-02-24 LAB — COMPREHENSIVE METABOLIC PANEL
ALT: 22 U/L (ref 0–44)
AST: 17 U/L (ref 15–41)
Albumin: 2.9 g/dL — ABNORMAL LOW (ref 3.5–5.0)
Alkaline Phosphatase: 84 U/L (ref 38–126)
Anion gap: 11 (ref 5–15)
BUN: 32 mg/dL — ABNORMAL HIGH (ref 8–23)
CO2: 18 mmol/L — ABNORMAL LOW (ref 22–32)
Calcium: 9 mg/dL (ref 8.9–10.3)
Chloride: 111 mmol/L (ref 98–111)
Creatinine, Ser: 1.06 mg/dL (ref 0.61–1.24)
GFR, Estimated: >60 mL/min (ref >60)
Glucose, Bld: 172 mg/dL — ABNORMAL HIGH (ref 70–99)
Potassium: 4.3 mmol/L (ref 3.5–5.1)
Sodium: 140 mmol/L (ref 135–145)
Total Bilirubin: 2 mg/dL — ABNORMAL HIGH (ref 0.3–1.2)
Total Protein: 6.1 g/dL — ABNORMAL LOW (ref 6.5–8.1)

## 2020-02-24 LAB — CBC
HCT: 35.2 % — ABNORMAL LOW (ref 39.0–52.0)
Hemoglobin: 11.8 g/dL — ABNORMAL LOW (ref 13.0–17.0)
MCH: 31.2 pg (ref 26.0–34.0)
MCHC: 33.5 g/dL (ref 30.0–36.0)
MCV: 93.1 fL (ref 80.0–100.0)
Platelets: 86 10*3/uL — ABNORMAL LOW (ref 150–400)
RBC: 3.78 MIL/uL — ABNORMAL LOW (ref 4.22–5.81)
RDW: 15.1 % (ref 11.5–15.5)
WBC: 8.2 10*3/uL (ref 4.0–10.5)
nRBC: 0 % (ref 0.0–0.2)

## 2020-02-24 MED ORDER — MORPHINE SULFATE (PF) 2 MG/ML IV SOLN
2.0000 mg | INTRAVENOUS | Status: DC | PRN
  Administered 2020-02-24 – 2020-02-25 (×3): 2 mg via INTRAVENOUS
  Filled 2020-02-24 (×2): qty 1

## 2020-02-24 MED ORDER — MORPHINE SULFATE (PF) 2 MG/ML IV SOLN
2.0000 mg | Freq: Once | INTRAVENOUS | Status: AC
  Filled 2020-02-24: qty 1

## 2020-02-24 MED ORDER — MORPHINE SULFATE (PF) 2 MG/ML IV SOLN
INTRAVENOUS | Status: AC
  Administered 2020-02-24: 2 mg via INTRAMUSCULAR
  Filled 2020-02-24: qty 1

## 2020-02-24 MED ORDER — SODIUM CHLORIDE 0.9% FLUSH
10.0000 mL | INTRAVENOUS | Status: DC | PRN
Start: 2020-02-24 — End: 2020-02-26

## 2020-02-24 NOTE — Progress Notes (Signed)
NAME:  [name], MRN:  [id number], DOB:  12/09/1940, LOS: 3 ADMISSION DATE:  02/19/2020, CONSULTATION DATE:  02/13/2020  REFERRING MD:  Dr Parke Simmers, CHIEF COMPLAINT:  Resp disterss, recurrence R hydrothorax   Brief History:  80 yo male former smoker with non cirrhotic portal hypertension admitted from 1/01 to 1/08 with aspiration and pleural effusion from hepatic hydrothorax.  He presented on 1/13 with inguinal hernia that was reduced, but prior to discharged developed respiratory distress from choking event while eating.  Past Medical History:  Upper GI bleeding, Thrombocytopenia, TBI, OSA, Tremor, Portal hypertension with esophageal varices, Pneumonia, Parkinsonism, Neuropathy, Nephrolithiasis, Colon cancer, HLD, GERD, Diverticulosis, CAD, BPH, A fib  Significant Hospital Events:  1/13 Admit, Rt thoracentesis by IR with 1.5 liters removed 1/15 restart cardizem gtt because he hasn't been able to swallow pills yet 1/16 d/w family >> plan to transition to comfort care when rest of family arrives  Consults:  GI 1/13 Nephrology 1/14  Procedures:    Significant Diagnostic Tests:    Micro Data:  COVID/Flu 1/13 >> negative Rt pleural fluid 1/13 >> MRSA PCR 1/14 >> negative  Antimicrobials:  Unasyn 1/13 Rocephin 1/13 >> Doxycycline 1/13 >>  Interim History / Subjective:  On bipap.  Received ativan for anxiety.  Objective   Blood pressure (!) 121/59, pulse 93, temperature (!) 97 F (36.1 C), temperature source Axillary, resp. rate (!) 28, height 6\' 2"  (1.88 m), weight 66.1 kg, SpO2 91 %.    FiO2 (%):  [50 %] 50 %   Intake/Output Summary (Last 24 hours) at 02/24/2020 1234 Last data filed at 02/23/2020 1235 Gross per 24 hour  Intake 9.99 ml  Output --  Net 9.99 ml   Filed Weights   03/01/2020 0134 02/22/20 1448 02/24/20 0500  Weight: 57.6 kg 61.7 kg 66.1 kg    General - somnolent, cachectic Eyes - pupils reactive ENT - Bipap mask on Cardiac - irregular Chest -  decreased BS at Rt base, b/l crackles Abdomen - soft, non tender, decreased bowel sounds Extremities - decreased muscle bulk Skin - areas of ecchymosis Neuro - not following commands   Resolved Hospital Problem list     Assessment & Plan:   Acute hypoxic respiratory failure from recurrent aspiration pneumonia and transudate Rt pleural effusion. - continue ABx, oxygen, Bipap for now - defer additional thoracentesis attempts  A fib with RVR. 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 - GI consulted - followed by [facility] as outpt  Circulatory shock. - monitor hemodynamics  AKI with CKD 2. Non gap metabolic acidosis. - improving with fluids  Dysphagia, cachexia. - defer speech assessment  Thrombocytopenia, mild. - defer additional lab testing  History of Parkinsonism. - unable to take pills  Goals of care. - had detailed discussion with pt's son.  [name] has expressed that he does not want to fight anymore, and is ready to transition to comfort measures - family planning to arrive in afternoon of 1/17 and then plan to transition to comfort care - continue current therapies for now, but no escalation of care  Best practice:  Diet: NPO DVT prophylaxis: SQ heparin GI prophylaxis: protonix Mobility:  Bed rest Code Status: okay with Bipap and pressors; no CPR, no defibrillation, no intubation Disposition: ICU  LABS   CMP Latest Ref Rng & Units 02/24/2020 02/23/2020 02/22/2020  Glucose 70 - 99 mg/dL 172(H) 117(H) 166(H)  BUN 8 - 23 mg/dL  32(H) 38(H) 49(H)  Creatinine 0.61 - 1.24 mg/dL 1.06 1.29(H) 1.96(H)  Sodium 135 - 145 mmol/L 140 138 135  Potassium 3.5 - 5.1 mmol/L 4.3 4.3 5.0  Chloride 98 - 111 mmol/L 111 109 102  CO2 22 - 32 mmol/L 18(L) 19(L) 20(L)  Calcium 8.9 - 10.3 mg/dL 9.0 8.8(L) 8.6(L)  Total Protein 6.5 - 8.1 g/dL 6.1(L) 6.1(L) 6.4(L)  Total Bilirubin 0.3 - 1.2  mg/dL 2.0(H) 1.6(H) 1.6(H)  Alkaline Phos 38 - 126 U/L 84 81 104  AST 15 - 41 U/L 17 20 26   ALT 0 - 44 U/L 22 22 25     CBC Latest Ref Rng & Units 02/24/2020 02/23/2020 02/22/2020  WBC 4.0 - 10.5 K/uL 8.2 11.7(H) 20.6(H)  Hemoglobin 13.0 - 17.0 g/dL 11.8(L) 12.0(L) 13.1  Hematocrit 39.0 - 52.0 % 35.2(L) 35.6(L) 39.1  Platelets 150 - 400 K/uL 86(L) 110(L) 161    ABG    Component Value Date/Time   PHART 7.428 2020-03-01 1008   PCO2ART 31.5 (L) Mar 01, 2020 1008   PO2ART 59.9 (L) 01-Mar-2020 1008   HCO3 20.6 Mar 01, 2020 1008   TCO2 22 11/23/2013 1540   ACIDBASEDEF 2.4 (H) Mar 01, 2020 1008   O2SAT 91.2 March 01, 2020 1008    CBG (last 3)  Recent Labs    02/22/20 0428  GLUCAP 150*    Signature:  Chesley Mires, MD [place] Pager - [phone] 02/24/2020, 12:34 PM

## 2020-02-24 NOTE — Progress Notes (Signed)
[facility] Progress Note  Subjective: pt seen in room, no new c/o  Vitals:   02/24/20 0700 02/24/20 0745 02/24/20 0752 02/24/20 0800  BP: 130/61 (!) 120/54 (!) 120/54 129/68  Pulse:  100 (!) 101 (!) 104  Resp: (!) 28  (!) 30 (!) 30  Temp:    (!) 97.3 F (36.3 C)  TempSrc:    Axillary  SpO2:  92% 92% 90%  Weight:      Height:        Exam: Gen alert, HOH, Ox 3, chron ill appearing, cachectic On bipap now Chest L lung clear, R reduced BS 1/2 up Cor irreg irreg , no RG Abd soft ntnd no mass, abd a bit more protruded Ext trace edema  Neuro is alert, Ox 3 , nf    Home meds:  - adderall 30 qd/ sinemet CR 50-200 bid  - aldactone 100 qd/ lasix 40 qd/ propranolol 20 bid  - protonix 40 bid  - prn's/ vitamins/ supplements    Date                           Creat               eGFR   2008- 2020                0.86- 1.10        73   Feb 09, 2020               1.40                 51   Jan 5                         1.26   Jan 7                         1.46   Feb 21, 2020             1.49                 48   Feb 22, 2020             1.96            UA hazy, prot neg, no rbc/ wbc    CXR 1/14 - IMPRESSION: Interval decrease in the size of the right pleural effusion and improved aeration of the right lung. No pneumothorax.     UNa 26, Creat 155  Assessment/ Plan: 1. AKI on CKD II - b/l creat in 2020 was 1.0, eGFR 60- 75 ml/min.  Creat here 1.4 on admit > 1.9 yest and down to 1.2 today w/ IVF"s. AKI due to dehydration, doubt HRS at this time. Creat improved w/ IVF"s to 1.0, however pt this am is on Bipap. IVF"s dc'd.  2. 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 Has had ascites drained in 2019 per pt, not recently.  He has been on lasix/ aldactone at home. Now on hold. Per notes patient has told family he is tired, palliative consult is pending.  3. Recurrent pleural effusion -  related to portal HTN 4. Shock - on midodrine 5. Aspiration PNA - on Rocephin 6. Partial code - yes to bipap, no to everything else     Kelly Splinter  02/24/2020, 8:45 AM   Recent Labs  Lab 02/23/20 0230 02/24/20 0243  K 4.3 4.3  BUN 38* 32*  CREATININE 1.29* 1.06  CALCIUM 8.8* 9.0  HGB 12.0* 11.8*   Inpatient medications: . Chlorhexidine Gluconate Cloth  6 each Topical Daily  . heparin  5,000 Units Subcutaneous Q8H  . mouth rinse  15 mL Mouth Rinse BID  . midodrine  10 mg Oral TID WC  . pantoprazole (PROTONIX) IV  40 mg Intravenous Daily  . sodium chloride flush  3 mL Intravenous Q12H   . sodium chloride 250 mL (02/11/2020 1420)  . cefTRIAXone (ROCEPHIN)  IV Stopped (02/23/20 1055)  . diltiazem (CARDIZEM) infusion 5 mg/hr (02/24/20 0119)  . doxycycline (VIBRAMYCIN) IV Stopped (02/24/20 0751)   LORazepam, ondansetron **OR** ondansetron (ZOFRAN) IV

## 2020-02-24 NOTE — Progress Notes (Signed)
I noted the plan to transition to comfort care which seems appropriate.  We are signing off for now.  Please call or page with any further questions or concerns.

## 2020-02-24 NOTE — Plan of Care (Signed)
  Problem: Education: Goal: Knowledge of General Education information will improve Description: Including pain rating scale, medication(s)/side effects and non-pharmacologic comfort measures Outcome: Not Progressing   Problem: Health Behavior/Discharge Planning: Goal: Ability to manage health-related needs will improve Outcome: Not Progressing   Problem: Clinical Measurements: Goal: Ability to maintain clinical measurements within normal limits will improve Outcome: Not Progressing Goal: Will remain free from infection Outcome: Progressing Goal: Diagnostic test results will improve Outcome: Not Progressing Goal: Respiratory complications will improve Outcome: Not Progressing Goal: Cardiovascular complication will be avoided Outcome: Progressing   Problem: Nutrition: Goal: Adequate nutrition will be maintained Outcome: Not Progressing   Problem: Coping: Goal: Level of anxiety will decrease Outcome: Progressing   Problem: Elimination: Goal: Will not experience complications related to bowel motility Outcome: Progressing Goal: Will not experience complications related to urinary retention Outcome: Progressing   Problem: Pain Managment: Goal: General experience of comfort will improve Outcome: Progressing   Problem: Safety: Goal: Ability to remain free from injury will improve Outcome: Progressing   Problem: Skin Integrity: Goal: Risk for impaired skin integrity will decrease Outcome: Progressing

## 2020-02-25 DIAGNOSIS — D649 Anemia, unspecified: Secondary | ICD-10-CM | POA: Diagnosis not present

## 2020-02-25 DIAGNOSIS — J9601 Acute respiratory failure with hypoxia: Secondary | ICD-10-CM | POA: Diagnosis not present

## 2020-02-25 DIAGNOSIS — I4891 Unspecified atrial fibrillation: Secondary | ICD-10-CM | POA: Diagnosis not present

## 2020-02-25 DIAGNOSIS — D696 Thrombocytopenia, unspecified: Secondary | ICD-10-CM

## 2020-02-25 DIAGNOSIS — N179 Acute kidney failure, unspecified: Secondary | ICD-10-CM | POA: Diagnosis not present

## 2020-02-25 LAB — BODY FLUID CULTURE: Culture: NO GROWTH

## 2020-02-25 MED ORDER — GLYCOPYRROLATE 1 MG PO TABS: 1.0000 mg | ORAL_TABLET | ORAL | Status: DC | PRN

## 2020-02-25 MED ORDER — ACETAMINOPHEN 650 MG RE SUPP: 650.0000 mg | Freq: Four times a day (QID) | RECTAL | Status: DC | PRN

## 2020-02-25 MED ORDER — DEXTROSE 5 % IV SOLN: INTRAVENOUS | Status: DC

## 2020-02-25 MED ORDER — GLYCOPYRROLATE 0.2 MG/ML IJ SOLN
0.2000 mg | INTRAMUSCULAR | Status: DC | PRN
Start: 2020-02-25 — End: 2020-02-26

## 2020-02-25 MED ORDER — FENTANYL 2500MCG IN NS 250ML (10MCG/ML) PREMIX INFUSION
0.0000 ug/h | INTRAVENOUS | Status: DC
  Administered 2020-02-25: 50 ug/h via INTRAVENOUS
  Filled 2020-02-25: qty 250

## 2020-02-25 MED ORDER — POLYVINYL ALCOHOL 1.4 % OP SOLN: 1.0000 [drp] | Freq: Four times a day (QID) | OPHTHALMIC | Status: DC | PRN

## 2020-02-25 MED ORDER — HALOPERIDOL LACTATE 5 MG/ML IJ SOLN: 2.5000 mg | INTRAMUSCULAR | Status: DC | PRN

## 2020-02-25 MED ORDER — ACETAMINOPHEN 325 MG PO TABS: 650.0000 mg | ORAL_TABLET | Freq: Four times a day (QID) | ORAL | Status: DC | PRN

## 2020-02-25 MED ORDER — DIPHENHYDRAMINE HCL 50 MG/ML IJ SOLN: 25.0000 mg | INTRAMUSCULAR | Status: DC | PRN

## 2020-02-25 MED ORDER — MIDAZOLAM HCL 2 MG/2ML IJ SOLN: 1.0000 mg | INTRAMUSCULAR | Status: DC | PRN

## 2020-02-25 MED ORDER — MIDAZOLAM BOLUS VIA INFUSION (WITHDRAWAL LIFE SUSTAINING TX)
2.0000 mg | INTRAVENOUS | Status: DC | PRN
  Filled 2020-02-25: qty 2

## 2020-02-25 MED ORDER — FENTANYL CITRATE (PF) 100 MCG/2ML IJ SOLN
50.0000 ug | INTRAMUSCULAR | Status: DC | PRN
  Administered 2020-02-25: 50 ug via INTRAVENOUS
  Filled 2020-02-25: qty 2

## 2020-02-25 MED ORDER — FENTANYL BOLUS VIA INFUSION
100.0000 ug | INTRAVENOUS | Status: DC | PRN
  Filled 2020-02-25: qty 100

## 2020-02-25 MED ORDER — MIDAZOLAM 50MG/50ML (1MG/ML) PREMIX INFUSION
0.0000 mg/h | INTRAVENOUS | Status: DC
  Administered 2020-02-25: 1 mg/h via INTRAVENOUS
  Filled 2020-02-25: qty 50

## 2020-03-03 ENCOUNTER — Ambulatory Visit: Payer: Medicare Other | Admitting: Internal Medicine

## 2020-03-10 ENCOUNTER — Ambulatory Visit: Payer: Medicare Other | Admitting: Cardiology

## 2020-03-11 NOTE — TOC Progression Note (Signed)
Transition of Care [facility]) - Progression Note    Patient Details  Name: [name] MRN: [id number] Date of Birth: 1940/05/03  [facility] CM/SW Contact  Leeroy Cha, RN Phone Number: March 14, 2020, 10:32 AM  Clinical Narrative:     Significant Hospital Events:  1/13 Admit, Rt thoracentesis by IR with 1.5 liters removed 1/15 restart cardizem gtt because he hasn't been able to swallow pills yet 1/16 d/w family >> plan to transition to comfort care when rest of family arrives WILL FOLLOW TO SEE FAMILY DECISION       Expected Discharge Plan and Services           Expected Discharge Date:  (unknown)                                     Social Determinants of Health (SDOH) Interventions    Readmission Risk Interventions No flowsheet data found.

## 2020-03-11 NOTE — Death Summary Note (Signed)
Physician Discharge Summary  Patient ID: [name] MRN: [id number] DOB/AGE: May 07, 1940 80 y.o.  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 Acute metabolic encephalopathy Noncirrhotic hepatic portal hypotension Shock Afib with RVR Left inguinal hernia Hyperkalemia HLD  Discharge Diagnoses:  Principal Problem:   Acute hypoxemic respiratory failure (HCC) Active Problems:   Hyperlipidemia   Atrial fibrillation with RVR (HCC)   Esophageal varices determined by endoscopy ([place])   Pleural effusion on right due to hepatic hydrothorax   Respiratory distress   Inguinal hernia   AKI (acute kidney injury) ([place]) on CKD 2   S/P thoracentesis End of life care/ palliative care Parkinson's disease Hyperglycemia Dysphagia Cachexia, malnutrition Non-anion gap metabolic acidosis Acute anemia Acute thrombocytopenia Shock   Discharged Condition: deceased  Hospital Course: [name] was admitted on 1/13 with an aspiration episode in the ED after he was able to have a hernia reduced. He developed persistent hypoxic and hypercapnic respiratory failure requiring BiPAP and remained bipap dependent. He had a thora at admission with 1.5L removed. He was treated for aspiration pneumonia with antibiotics. Shock- treated with pressors and antibiotics. Afib with RVR treated with cardiazem infusion. He was evaluated by GI and IR for possible TIPS since this had been considered as an outpatient alternative management for his [facility], but his family decided to focus on comfort and transition him to be full comfort care. He expired with family at bedside on 1/17 at 19:45.   Consults: GI, PCCM, Nephrology, Pallitaive Care, interventional radiology  Significant Diagnostic Studies:  Pleural fluid culture 1/13> NG Pleural fluid fungus & AFB pending Renal US: medical renal disease  bilaterally, no hydronephrosis. R pleural effusion.   Treatments:  BiPAP, respiratory support, supplemental oxygen, bronchodilators Thoracentesis antibiotics Vasopressors, IVF Cardiac meds- diltiazem Insulin    Disposition:  Funeral home of the family's choosing   Signed: Julian Hy 02/26/2020, 6:12 PM

## 2020-03-11 NOTE — Progress Notes (Signed)
NAME:  [name], MRN:  [id number], DOB:  1940/08/24, LOS: 4 ADMISSION DATE:  03/06/2020, CONSULTATION DATE:  03-06-2020  REFERRING MD:  Dr Parke Simmers, CHIEF COMPLAINT:  Resp disterss, recurrence R hydrothorax   Brief History:  80 yo male former smoker with non cirrhotic portal hypertension admitted from 1/01 to 1/08 with aspiration and pleural effusion from hepatic hydrothorax.  He presented on 1/13 with inguinal hernia that was reduced, but prior to discharged developed respiratory distress from choking event while eating.  Past Medical History:  Upper GI bleeding, Thrombocytopenia, TBI, OSA, Tremor, Portal hypertension with esophageal varices, Pneumonia, Parkinsonism, Neuropathy, Nephrolithiasis, Colon cancer, HLD, GERD, Diverticulosis, CAD, BPH, A fib  Significant Hospital Events:  1/13 Admit, Rt thoracentesis by IR with 1.5 liters removed 1/15 restart cardizem gtt because he hasn't been able to swallow pills yet 1/16 d/w family >> plan to transition to comfort care when rest of family arrives  Consults:  GI 1/13 Nephrology 1/14  Procedures:    Significant Diagnostic Tests:    Micro Data:  COVID/Flu 1/13 >> negative Rt pleural fluid 1/13 >> MRSA PCR 1/14 >> negative  Antimicrobials:  Unasyn 1/13 Rocephin 1/13 >> Doxycycline 1/13 >>  Interim History / Subjective:  On bipap.  Received ativan for anxiety.  Objective   Blood pressure (!) 129/59, pulse 91, temperature (!) 97.3 F (36.3 C), temperature source Axillary, resp. rate 20, height 6\' 2"  (1.88 m), weight 67.8 kg, SpO2 91 %.    FiO2 (%):  [90 %] 90 %   Intake/Output Summary (Last 24 hours) at 03/03/2020 1054 Last data filed at 02/28/2020 0412 Gross per 24 hour  Intake 0 ml  Output 2425 ml  Net -2425 ml   Filed Weights   02/22/20 1448 02/24/20 0500 02/10/2020 0500  Weight: 61.7 kg 66.1 kg 67.8 kg    General - frail, cachectic elderly man laying in bed in NAD, laded veins on chest wall. Eyes - PERRL, mild  scleral edema, ENT - Victor/AT, BiPAP mask Cardiac -tachycardic, regular rhythm Chest -tachypneic, no accessory muscle use.  Clear to auscultation bilaterally. Abdomen -soft, nontender, nondistended Extremities -minimal muscle mass, no clubbing or cyanosis Skin -no rashes Neuro -awake, looking around, occasionally pulling at mask.  Resolved Hospital Problem list     Assessment & Plan:   Acute hypoxic respiratory failure from recurrent aspiration pneumonia and transudate Rt pleural effusion -Continue empiric antibiotics -Continue BiPAP to control work of breathing -Not planning for additional thoracentesis attempts DNI is appropriate  A fib with RVR. 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  No longer following given change in goals of care - followed by [facility] as outpt  Circulatory shock-resolved -Continue to monitor; not currently requiring vasopressors  AKI with CKD 2. Non gap metabolic acidosis. -No additional monitoring needed -Strict I's/O - Renally dose meds, avoid nephrotoxic meds  Dysphagia, cachexia. - defer speech assessment at this point  Acute thrombocytopenia, likely due to critical illness, possibly medication side effect -No additional monitoring needed  Acute anemia likely due to critical illness -No additional monitoring  History of Parkinsonism - unable to take pills; PTA Parkinson's meds  Hyperglycemia, controlled -No additional monitoring needed  Goals of care -Dr. Halford Chessman had detailed discussion with pt's son.  [name] has expressed that he does not want to fight anymore, and is ready to transition to comfort measures - family planning to arrive in afternoon 1/17  and then plan to transition to comfort care - continue current therapies for now, but no escalation of care  Best practice:  Diet:  NPO DVT prophylaxis: SQ heparin GI prophylaxis: protonix Mobility:  Bed rest Code Status: okay with Bipap and pressors; no CPR, no defibrillation, no intubation Disposition: ICU  LABS   CMP Latest Ref Rng & Units 02/24/2020 02/23/2020 02/22/2020  Glucose 70 - 99 mg/dL 172(H) 117(H) 166(H)  BUN 8 - 23 mg/dL 32(H) 38(H) 49(H)  Creatinine 0.61 - 1.24 mg/dL 1.06 1.29(H) 1.96(H)  Sodium 135 - 145 mmol/L 140 138 135  Potassium 3.5 - 5.1 mmol/L 4.3 4.3 5.0  Chloride 98 - 111 mmol/L 111 109 102  CO2 22 - 32 mmol/L 18(L) 19(L) 20(L)  Calcium 8.9 - 10.3 mg/dL 9.0 8.8(L) 8.6(L)  Total Protein 6.5 - 8.1 g/dL 6.1(L) 6.1(L) 6.4(L)  Total Bilirubin 0.3 - 1.2 mg/dL 2.0(H) 1.6(H) 1.6(H)  Alkaline Phos 38 - 126 U/L 84 81 104  AST 15 - 41 U/L 17 20 26   ALT 0 - 44 U/L 22 22 25     CBC Latest Ref Rng & Units 02/24/2020 02/23/2020 02/22/2020  WBC 4.0 - 10.5 K/uL 8.2 11.7(H) 20.6(H)  Hemoglobin 13.0 - 17.0 g/dL 11.8(L) 12.0(L) 13.1  Hematocrit 39.0 - 52.0 % 35.2(L) 35.6(L) 39.1  Platelets 150 - 400 K/uL 86(L) 110(L) 161    ABG    Component Value Date/Time   PHART 7.428 02/28/2020 1008   PCO2ART 31.5 (L) 02/24/2020 1008   PO2ART 59.9 (L) 02/18/2020 1008   HCO3 20.6 02/20/2020 1008   TCO2 22 11/23/2013 1540   ACIDBASEDEF 2.4 (H) 02/22/2020 1008   O2SAT 91.2 02/10/2020 1008    CBG (last 3)  No results for input(s): GLUCAP in the last 72 hours.   This patient is critically ill with multiple organ system failure which requires frequent high complexity decision making, assessment, support, evaluation, and titration of therapies. This was completed through the application of advanced monitoring technologies and extensive interpretation of multiple databases. During this encounter critical care time was devoted to patient care services described in this note for 34 minutes.   Julian Hy, DO 03/25/2020 11:01 AM [facility]

## 2020-03-11 NOTE — Plan of Care (Signed)
Patient's family at bedside, ready to transition to full comfort care. Will start fentanyl infusion and versed to make him comfortable, then remove bipap. Orders placed and discussed with RN. Son will remain with him at bedside.  Julian Hy, DO 02/09/2020 12:53 PM [facility]

## 2020-03-11 NOTE — Progress Notes (Signed)
262mL of Fentanyl & 78mL Versed leftover on IV pump. Medications wasted by 2 RNs (Cami V. RN & Bethanne Ginger RN)

## 2020-03-11 NOTE — Progress Notes (Signed)
Pt passed away peacefully @ 1945p with his son Ronalee Belts), spouse Jeanett Schlein), and daughter Jolaine Artist) present. Pt was made DNR and comfort care on 1/17 @ 1252pm by Noemi Chapel, DO (attending).  No heartbeat or breath sounds heard by 2 RNs (Cami V., RN & Bethanne Ginger., RN), TOD confirmed by [employer].  TOD reported to coverage CCM MD ([place]). Pt placement updated and port-mortum flowsheet has been filled out.  Family will call pt placement when arrangements have been made.

## 2020-03-11 NOTE — Progress Notes (Signed)
Palliative care brief progress note  Discussed with PCCM attending this AM.    Plan is for transition to comfort this afternoon once family arrives for visit.  As goals clear and no acute needs, will hold on formal consult at this time. Our team is happy to engage if we can be of assistance in the care of Mr. Pieri moving forward.  Please call if there are palliative specific needs with which we can be of assistance.  Micheline Rough, MD [facility] [phone]   NO CHARGE NOTE

## 2020-03-11 DEATH — deceased

## 2020-03-24 LAB — FUNGUS CULTURE RESULT

## 2020-03-24 LAB — FUNGUS CULTURE WITH STAIN

## 2020-03-24 LAB — FUNGAL ORGANISM REFLEX

## 2020-04-08 LAB — ACID FAST CULTURE WITH REFLEXED SENSITIVITIES (MYCOBACTERIA): Acid Fast Culture: NEGATIVE

## 2020-04-09 ENCOUNTER — Ambulatory Visit: Payer: Medicare Other | Admitting: Adult Health

## 2021-01-20 ENCOUNTER — Ambulatory Visit: Payer: Medicare Other | Admitting: Oncology
